# Patient Record
Sex: Male | Born: 1965
Health system: Southern US, Community
[De-identification: ages and names within clinical notes are randomized; demographics above are authoritative.]

## PROBLEM LIST (undated history)

## (undated) DIAGNOSIS — Z9861 Coronary angioplasty status: Principal | ICD-10-CM

## (undated) DIAGNOSIS — Z8719 Personal history of other diseases of the digestive system: Secondary | ICD-10-CM

## (undated) DIAGNOSIS — R7303 Prediabetes: Secondary | ICD-10-CM

## (undated) DIAGNOSIS — G4730 Sleep apnea, unspecified: Secondary | ICD-10-CM

## (undated) DIAGNOSIS — Z9289 Personal history of other medical treatment: Secondary | ICD-10-CM

## (undated) DIAGNOSIS — K5190 Ulcerative colitis, unspecified, without complications: Secondary | ICD-10-CM

## (undated) DIAGNOSIS — I2510 Atherosclerotic heart disease of native coronary artery without angina pectoris: Secondary | ICD-10-CM

## (undated) DIAGNOSIS — I10 Essential (primary) hypertension: Secondary | ICD-10-CM

## (undated) DIAGNOSIS — M109 Gout, unspecified: Secondary | ICD-10-CM

## (undated) DIAGNOSIS — F329 Major depressive disorder, single episode, unspecified: Secondary | ICD-10-CM

## (undated) DIAGNOSIS — E785 Hyperlipidemia, unspecified: Secondary | ICD-10-CM

## (undated) DIAGNOSIS — R0981 Nasal congestion: Secondary | ICD-10-CM

## (undated) DIAGNOSIS — I252 Old myocardial infarction: Secondary | ICD-10-CM

## (undated) DIAGNOSIS — K219 Gastro-esophageal reflux disease without esophagitis: Secondary | ICD-10-CM

## (undated) DIAGNOSIS — G5602 Carpal tunnel syndrome, left upper limb: Secondary | ICD-10-CM

## (undated) HISTORY — DX: Gastro-esophageal reflux disease without esophagitis: K21.9

## (undated) HISTORY — DX: Essential (primary) hypertension: I10

## (undated) HISTORY — DX: Old myocardial infarction: I25.2

## (undated) HISTORY — DX: Personal history of other medical treatment: Z92.89

## (undated) HISTORY — DX: Atherosclerotic heart disease of native coronary artery without angina pectoris: I25.10

## (undated) HISTORY — DX: Coronary angioplasty status: Z98.61

## (undated) HISTORY — DX: Nasal congestion: R09.81

## (undated) HISTORY — DX: Carpal tunnel syndrome, left upper limb: G56.02

## (undated) HISTORY — PX: TONSILLECTOMY: SHX5217

## (undated) HISTORY — DX: Prediabetes: R73.03

## (undated) HISTORY — PX: CYSTECTOMY: SUR359

## (undated) HISTORY — DX: Ulcerative colitis, unspecified, without complications: K51.90

## (undated) HISTORY — PX: HERNIA REPAIR: SHX51

## (undated) HISTORY — DX: Hyperlipidemia, unspecified: E78.5

---

## 1994-03-27 DIAGNOSIS — I252 Old myocardial infarction: Secondary | ICD-10-CM

## 1994-03-27 HISTORY — DX: Old myocardial infarction: I25.2

## 1994-03-27 HISTORY — PX: CORONARY STENT PLACEMENT: SHX1402

## 1998-03-23 ENCOUNTER — Encounter: Payer: Self-pay | Admitting: Cardiovascular Disease

## 1998-03-23 ENCOUNTER — Inpatient Hospital Stay (HOSPITAL_COMMUNITY): Admission: EM | Admit: 1998-03-23 | Discharge: 1998-03-24 | Payer: Self-pay | Admitting: Emergency Medicine

## 1998-03-24 ENCOUNTER — Encounter: Payer: Self-pay | Admitting: Cardiovascular Disease

## 2000-05-09 ENCOUNTER — Encounter (INDEPENDENT_AMBULATORY_CARE_PROVIDER_SITE_OTHER): Payer: Self-pay | Admitting: Specialist

## 2000-05-09 ENCOUNTER — Ambulatory Visit (HOSPITAL_COMMUNITY): Admission: RE | Admit: 2000-05-09 | Discharge: 2000-05-09 | Payer: Self-pay | Admitting: Gastroenterology

## 2000-07-10 ENCOUNTER — Encounter: Payer: Self-pay | Admitting: Emergency Medicine

## 2000-07-10 ENCOUNTER — Inpatient Hospital Stay (HOSPITAL_COMMUNITY): Admission: EM | Admit: 2000-07-10 | Discharge: 2000-07-12 | Payer: Self-pay | Admitting: Emergency Medicine

## 2000-07-11 HISTORY — PX: CORONARY ANGIOPLASTY: SHX604

## 2003-09-17 ENCOUNTER — Ambulatory Visit (HOSPITAL_COMMUNITY): Admission: RE | Admit: 2003-09-17 | Discharge: 2003-09-17 | Payer: Self-pay | Admitting: Gastroenterology

## 2003-09-30 ENCOUNTER — Ambulatory Visit (HOSPITAL_COMMUNITY): Admission: RE | Admit: 2003-09-30 | Discharge: 2003-09-30 | Payer: Self-pay | Admitting: Gastroenterology

## 2003-09-30 ENCOUNTER — Encounter (INDEPENDENT_AMBULATORY_CARE_PROVIDER_SITE_OTHER): Payer: Self-pay | Admitting: Specialist

## 2004-02-23 ENCOUNTER — Encounter: Admission: RE | Admit: 2004-02-23 | Discharge: 2004-02-23 | Payer: Self-pay | Admitting: Family Medicine

## 2005-05-04 ENCOUNTER — Encounter: Admission: RE | Admit: 2005-05-04 | Discharge: 2005-05-04 | Payer: Self-pay | Admitting: Gastroenterology

## 2008-09-18 ENCOUNTER — Encounter: Admission: RE | Admit: 2008-09-18 | Discharge: 2008-09-18 | Payer: Self-pay | Admitting: Gastroenterology

## 2009-01-07 ENCOUNTER — Ambulatory Visit (HOSPITAL_COMMUNITY): Admission: RE | Admit: 2009-01-07 | Discharge: 2009-01-07 | Payer: Self-pay | Admitting: Gastroenterology

## 2009-01-07 ENCOUNTER — Encounter (INDEPENDENT_AMBULATORY_CARE_PROVIDER_SITE_OTHER): Payer: Self-pay | Admitting: Gastroenterology

## 2009-01-19 ENCOUNTER — Ambulatory Visit: Payer: Self-pay | Admitting: Family Medicine

## 2009-01-20 ENCOUNTER — Encounter (HOSPITAL_COMMUNITY): Admission: RE | Admit: 2009-01-20 | Discharge: 2009-02-19 | Payer: Self-pay | Admitting: Family Medicine

## 2009-05-20 ENCOUNTER — Ambulatory Visit: Payer: Self-pay | Admitting: Family Medicine

## 2009-06-09 ENCOUNTER — Encounter (INDEPENDENT_AMBULATORY_CARE_PROVIDER_SITE_OTHER): Payer: Self-pay | Admitting: Cardiovascular Disease

## 2009-06-09 ENCOUNTER — Ambulatory Visit (HOSPITAL_COMMUNITY): Admission: RE | Admit: 2009-06-09 | Discharge: 2009-06-09 | Payer: Self-pay | Admitting: Cardiovascular Disease

## 2010-01-28 ENCOUNTER — Ambulatory Visit (HOSPITAL_COMMUNITY): Admission: RE | Admit: 2010-01-28 | Payer: Self-pay | Admitting: Cardiovascular Disease

## 2010-01-28 ENCOUNTER — Encounter (HOSPITAL_COMMUNITY): Admission: RE | Admit: 2010-01-28 | Payer: Self-pay | Admitting: Cardiology

## 2010-04-16 ENCOUNTER — Encounter: Payer: Self-pay | Admitting: Cardiovascular Disease

## 2010-04-16 ENCOUNTER — Encounter: Payer: Self-pay | Admitting: Cardiology

## 2010-04-17 ENCOUNTER — Encounter: Payer: Self-pay | Admitting: Family Medicine

## 2010-06-10 ENCOUNTER — Ambulatory Visit (INDEPENDENT_AMBULATORY_CARE_PROVIDER_SITE_OTHER): Payer: Commercial Managed Care - PPO | Admitting: Family Medicine

## 2010-06-10 DIAGNOSIS — J069 Acute upper respiratory infection, unspecified: Secondary | ICD-10-CM

## 2010-08-12 NOTE — Op Note (Signed)
NAMEJOSIAS, Daniel Buck                           ACCOUNT NO.:  [id number]   MEDICAL RECORD NO.:  [id number]                   PATIENT TYPE:  AMB   LOCATION:  ENDO                                 FACILITY:  [facility]   PHYSICIAN:  Jeryl Columbia, M.D.                 DATE OF BIRTH:  24-Jan-1966   DATE OF PROCEDURE:  09/30/2003  DATE OF DISCHARGE:                                 OPERATIVE REPORT   PROCEDURE:  Esophagogastroduodenoscopy with biopsy.   INDICATIONS:  Upper tract symptoms mostly with a lump in his throat.  Consent was signed after risks, benefits, methods, and options thoroughly  discussed multiple times in the past.   MEDICINES USED:  Demerol 100 mg, Versed 10 mg.   PROCEDURE:  The video endoscope was inserted by direct vision.  The  esophagus was normal.  In the distal esophagus was a small hiatal hernia.  I  believe the stomach increased a little bit into the esophagus but doubt this  was a short-segment Barrett's, but two biopsies were obtained at the end of  the procedure just to make sure.  The scope passed into the stomach and  advanced thorough a normal antrum, normal pylorus, into a normal duodenal  bulb, and around the C-loop to a normal second portion of the duodenum.  The  scope was withdrawn back to the bulb and a good look there ruled out ulcers  in that location.  The scope was withdrawn back to the stomach and  retroflexed.  High in the cardia the hiatal hernia was confirmed.  The  fundus, lesser and greater curve, and angularis were all normal except for a  small gastric polyp, which was cold biopsied and put in a separate  container.  Straight visualization in the stomach did not reveal any  additional findings.  Air was suctioned and the scope was slowly withdrawn.  Again a good look at the esophagus was normal.  We did withdraw back to  about 18 cm.  We then readvanced to the GE junction and the biopsies of the  questionable short segment of Barrett's versus  top of the hiatal hernia was  obtained, put in a second container.  The scope was slowly withdrawn one  more time, again not revealing any esophageal findings.  A quick look at the  posterior pharynx and cords was normal as well.  The scope was removed.  The  patient tolerated the procedure well.  There was no obvious immediate  complication.   ENDOSCOPIC DIAGNOSES:  1. Small hiatal hernia, doubt short segment of Barrett's, status post     biopsy.  2. Small stomach along the greater curve, biopsied.  3. Otherwise within normal limits esophagogastroduodenoscopy, including a     quick look at the posterior pharynx.   PLAN:  1. Await pathology.  May give him a trial of Reglan or an antispasmodic.  2.  Consider further workup by Dr. Mare Loan or even a neck CT scan.  3. Otherwise, we will touch base when we review the biopsies to recheck his     symptoms and decide further workup and plans at that juncture.                                               Jeryl Columbia, M.D.    MEM/MEDQ  D:  09/30/2003  T:  10/01/2003  Job:  [id number]   cc:   Ileene Hutchinson T. Erik Obey, M.D.  [address]  [place] [address]  Fax: [place]. Rollene Fare, M.D.  [phone] [address]  [place]  [place] [address]  Fax: [phone]   Osvaldo Human, M.D.  [address] [place]  [place] [address]  Fax: [phone]

## 2010-08-12 NOTE — Procedures (Signed)
[place]. [facility]  Patient:    Daniel Buck, Daniel Buck                        MRN: [id number] Proc. Date: 05/09/00 Adm. Date:  [id number] Attending:  Orvis Brill CC:         Richard A. Rollene Fare, M.D.   Procedure Report  PROCEDURE:  Esophagogastroduodenoscopy and biopsy.  INDICATION:  Patient with increased upper tract symptoms.  Consent was signed after risks, benefits, methods, and options thoroughly discussed with both the patient and his wife multiple times in the past.  MEDICATIONS:  Additional medicines for this procedure, since it followed the colonoscopy, was 25 mg of Demerol and 2 mg of Versed.  DESCRIPTION OF PROCEDURE:  The video endoscope was inserted by direct vision. Proximal and midesophagus were normal.  In the distal esophagus was a small hiatal hernia.  Scope passed into the stomach and advanced through a normal pylorus into a normal duodenal bulb and around the C-loop to the second and third part of the duodenum, which did have some very mild duodenitis. Scattered biopsies were obtained.  The scope was slowly withdrawn back to the bulb again, which was normal.  The scope was withdrawn back to the stomach and retroflexed.  High in the cardia, the hiatal hernia was confirmed.  The angularis was normal.  Proximal and lesser and greater curve and fundus were all normal on retroflex visualization.  Straight visualization of the stomach did show some mild antritis, possibly a tiny gastric polyp in the antrum, which was biopsied and put in a separate container.  Air was suctioned and the scopes slowly withdrawn.  Again a good look at the esophagus on slow withdrawal was normal.  The scope was removed.  The patient tolerated the procedure well.  There was no evidence of any complication.  ENDOSCOPIC DIAGNOSES: 1. Small hiatal hernia. 2. Minimal antritis, questionable tiny polyp status post biopsy. 3. Mild duodenitis, status post biopsy. 4.  Otherwise within normal limits EGD.  PLAN:  Await pathology, possibly change his pump inhibitors.  Call p.r.n., otherwise follow up in two months to recheck symptoms and make sure no further workup plans are needed. DD:  05/09/00 TD:  05/10/00 Job: [id number]

## 2010-08-12 NOTE — Procedures (Signed)
[place]. [facility]  Patient:    Daniel Buck, Daniel Buck                        MRN: [id number] Proc. Date: 05/09/00 Adm. Date:  [id number] Attending:  Orvis Brill CC:         Richard A. Rollene Fare, M.D.   Procedure Report  PROCEDURE:  Colonoscopy.  INDICATIONS:  Increase in colitis symptoms.  Want to reevaluate disease. Consent was signed after risks, benefits, methods and options were thoroughly discussed in the office with both the patient and his wife.  MEDICATIONS:  Demerol 100, Versed 10.  PROCEDURE:  Rectal inspection was pertinent for external hemorrhoids, small. Digital examination was negative.  The video pediatric colonoscope was inserted fairly easily and advance around the colon to the cecum.  This did require some abdominal pressure, but no position changes.   On insertion, surprisingly the rectum was normal with only a few rare patches of colitis. However, in the sigmoid was moderate inflammation.  No other abnormalities were seen as we advanced to the cecum.  The scope was inserted a short ways in the terminal ileum, which was normal.  Biopsies and photo documentation were obtained.  The scope was slowly withdrawn.  Random right and left-sided normal colon biopsies were obtained.  The prep was good.  The scope was slowly withdrawn and at about 25 cm is where the colitis started and biopsies of that area and the rectum were obtained then put in the third container.  Once back in the rectum, the scope was retroflexed, pertinent for some internal hemorrhoids.  Again, the rectum looked surprisingly normal with only a rare patch of very mild colitis.  Air was suctioned and scope removed.  The patient tolerated the procedure well.  There were no obvious immediate complication.  ENDOSCOPIC DIAGNOSES: 1. Internal, external hemorrhoids. 2. Significant inflammation from about 20-25 cm just involving the    sigmoid with very minimal rectal  involvement only. 3. Otherwise within normal limits to the terminal ileum, status post biopsy.  PLAN:  Await pathology, considerations of rediscussion about suppositories or enema care.  Will also rediscuss prednisone versus Enterocort, but in the meantime increase his Asacol (if that is helpful) and continue work-up with an EGD. DD:  05/09/00 TD:  05/10/00 Job: [id number]

## 2010-08-12 NOTE — Cardiovascular Report (Signed)
[place]. [facility]  Patient:    Daniel Buck, Daniel Buck                        MRN: [id number] Adm. Date:  [id number] Disc. Date: [id number] Attending:  Epifanio Lesches CC:         Richard A. Rollene Fare, M.D.  Cardiac Catheterization Laboratory  Jeryl Columbia, M.D.   Cardiac Catheterization  PROCEDURE: 1. Left heart catheterization. 2. Cine coronary angiography. 3. Biplane cine left ventriculography. 4. Distal aortography. 5. Cutting balloon angioplasty of the proximal right coronary artery    (3.75 x 15 mm monorail cutting balloon done with double bolus    Integrilin and weight-adjusted heparinization.  CARDIOLOGIST:  Troy Sine, M.D.  INDICATIONS:  Daniel Buck is a 45 year old white male who suffered an inferior wall MI in June 1996 and underwent tandem stenting of his proximal to mid right coronary artery with a reperfusion time of two hours at which time he had been enrolled in the ______ three-stent pilot study.  He developed restenosis in October and underwent repeat dilatation.  He has done well since that time.  For the last two months, he has noticed recurrent chest pain suggesting progressive angina.  He was admitted to [facility] yesterday with recurrent chest pain and is now referred for catheterization.  HEMODYNAMIC DATA: 1. Central aortic pressure was 125/71. 2. Left ventricular pressure 125/21.  PROCEDURE NOTE:  After premedication with Versed intravenously, the patient was prepped and draped in the usual fashion.  The right femoral artery was punctured anteriorly, and a 6-French sheath was inserted.  Diagnostic catheterization was done with 6-French Judkins left and right coronary catheters as well as a 6-French pigtail catheter which was used for biplane cine left ventriculography as well as distal aortography.  With the demonstration of diffuse 70 to 75% stenosis within the stented segment in the right coronary artery, the  decision was made to attempt cutting balloon intervention.  The sheath was upgraded to a 7-French system.  Double bolus Integrilin and weight-adjusted heparinization was administered with 4500 units of heparin being given.  A 3.75 x 15 mm cutting balloon was then advanced over ultimately a high-torque floppy wire.  Numerous cuts were made with still significant dumbbelling of the balloon which ultimately completely opened at 10 atmospheres within the stented segment.  Cine angiography confirmed an excellent angiographic result.  Arterial sheath was sutured in place with plans for sheath removal later today.  ANGIOGRAPHIC DATA: Left main coronary artery was angiographically normal bifurcating into an LAD and left circumflex system.  The LAD gave rise to a high proximal diagonal vessel which was normal and otherwise gave rise to several small septal perforating arteries which was normal.  The circumflex coronary artery was angiographically normal and gave rise to a prominent marginal vessel and several small proximal marginal vessels.  The right coronary artery was a large vessel.  There was 30 to 40% narrowing in the most proximal segment before the proximal to mid tandem stents.  There was diffuse narrowing within the proximal portion of the stent narrowing up to 75% somewhat eccentrically in the mid distal portion of this proximal to mid tandem  [id number] stent.  There also was 30 to 40% smooth narrowing in the region of the crux.  Biplane selective angiography revealed normal global LV function with mild residual hypokinesis in the mid diaphragmatic segment and in the lower posterolateral wall.  Distal aortography did  not demonstrate any renal artery stenosis.  There was no significant aortoiliac disease.  Following cutting balloon angioplasty, the in-stent restenosis was reduced from 75% to 0%.  There was no evidence for dissection.  There was slightly reduced flow in a very  diminutive side branch which arose within this stented segment.  IMPRESSION: 1. Normal left ventricular function with minimal residual inferior mid    hypocontractility as well as lower posterolateral hypocontractility. 2. A 50 to 75% in-stent restenosis in the proximal to mid right coronary    artery previous tandem stents placed in 1996 with 30 to 40% smooth    narrowing in the distal right coronary artery. 3. Successful cutting balloon intervention in the stented segment with a    3.75 x 15 mm cutting balloon with percent diameter stenosis being    reduced to 0% and no evidence for dissection.  (Double bolus Integrilin and    weight-adjusted heparinization.) DD:  07/11/00 TD:  07/12/00 Job: [id number]

## 2011-02-02 ENCOUNTER — Encounter: Payer: Self-pay | Admitting: Family Medicine

## 2011-02-02 ENCOUNTER — Ambulatory Visit (INDEPENDENT_AMBULATORY_CARE_PROVIDER_SITE_OTHER): Payer: Commercial Managed Care - PPO | Admitting: Family Medicine

## 2011-02-02 ENCOUNTER — Other Ambulatory Visit: Payer: Self-pay | Admitting: Family Medicine

## 2011-02-02 VITALS — BP 140/96 | HR 64 | Temp 99.0°F | Ht 72.0 in | Wt 211.0 lb

## 2011-02-02 DIAGNOSIS — M791 Myalgia, unspecified site: Secondary | ICD-10-CM

## 2011-02-02 DIAGNOSIS — I2510 Atherosclerotic heart disease of native coronary artery without angina pectoris: Secondary | ICD-10-CM

## 2011-02-02 DIAGNOSIS — Z79899 Other long term (current) drug therapy: Secondary | ICD-10-CM

## 2011-02-02 DIAGNOSIS — I10 Essential (primary) hypertension: Secondary | ICD-10-CM | POA: Insufficient documentation

## 2011-02-02 DIAGNOSIS — E7800 Pure hypercholesterolemia, unspecified: Secondary | ICD-10-CM

## 2011-02-02 DIAGNOSIS — IMO0001 Reserved for inherently not codable concepts without codable children: Secondary | ICD-10-CM

## 2011-02-02 DIAGNOSIS — R509 Fever, unspecified: Secondary | ICD-10-CM

## 2011-02-02 DIAGNOSIS — R05 Cough: Secondary | ICD-10-CM

## 2011-02-02 DIAGNOSIS — K5190 Ulcerative colitis, unspecified, without complications: Secondary | ICD-10-CM | POA: Insufficient documentation

## 2011-02-02 LAB — CBC WITH DIFFERENTIAL/PLATELET
Basophils Absolute: 0.1 10*3/uL (ref 0.0–0.1)
Basophils Relative: 1 % (ref 0–1)
Hemoglobin: 15.9 g/dL (ref 13.0–17.0)
MCHC: 34.3 g/dL (ref 30.0–36.0)
Monocytes Relative: 9 % (ref 3–12)
Neutro Abs: 7.6 10*3/uL (ref 1.7–7.7)
Neutrophils Relative %: 76 % (ref 43–77)
Platelets: 191 10*3/uL (ref 150–400)
RDW: 13 % (ref 11.5–15.5)

## 2011-02-02 LAB — POCT INFLUENZA A/B
Influenza A, POC: NEGATIVE
Influenza B, POC: NEGATIVE

## 2011-02-02 LAB — COMPREHENSIVE METABOLIC PANEL
ALT: 30 U/L (ref 0–53)
AST: 26 U/L (ref 0–37)
Albumin: 4.5 g/dL (ref 3.5–5.2)
Alkaline Phosphatase: 71 U/L (ref 39–117)
Potassium: 3.8 mEq/L (ref 3.5–5.3)
Sodium: 138 mEq/L (ref 135–145)
Total Bilirubin: 0.6 mg/dL (ref 0.3–1.2)
Total Protein: 7.3 g/dL (ref 6.0–8.3)

## 2011-02-02 LAB — LIPID PANEL
LDL Cholesterol: 72 mg/dL (ref 0–99)
VLDL: 40 mg/dL (ref 0–40)

## 2011-02-02 NOTE — Patient Instructions (Addendum)
Please try and check blood pressure elsewhere, and bring list of BP's to your visit with cardiologist. Please reschedule appointment with Dr. Alanda Amass Please try and stop using chew  (all tobacco products) Please see dentist regularly for routine cleanings, and oral cancer screenings  You may use Mucinex (plain or DM) or Robitussin (plain or DM) if needed for cough. Continue acetaminophen (tylenol) as needed for fever and pain--do not exceed amount recommended on bottle.

## 2011-02-02 NOTE — Progress Notes (Signed)
Patient presents with complaint of body aching x 3 days.  Tylenol helps, but pain recurs when it wears off, needing to take every 4 hours.  Pain in neck, back, and even skin feels sore.  Coughing x 3 days, mostly at night/morning, not much throughout day, only small amount of phlegm, not expectorating it.  Some stuffy nose.  Denies sore throat, sinus pain, sneezing.  Son was sick a couple of weeks ago, with cough x few days.  +fevers--T 101.5 at 6am.  He had appt with cardiologist today that he needed to reschedule due to this illness. Last labs were 04/2009.  Rarely checks BP elsewhere, runs 130's/80  Past Medical History  Diagnosis Date  . CAD (coronary artery disease)     stents.  Sees Dr. Alanda Amass  . Hypertension   . Hyperlipidemia   . Ulcerative colitis   . GERD (gastroesophageal reflux disease)     Past Surgical History  Procedure Date  . Tonsillectomy child    History   Social History  . Marital Status: Married    Spouse Name: N/A    Number of Children: N/A  . Years of Education: N/A   Occupational History  . Not on file.   Social History Main Topics  . Smoking status: Former Smoker    Quit date: 03/27/1992  . Smokeless tobacco: Current User    Types: Chew  . Alcohol Use: Yes     maybe once a month.  . Drug Use: No  . Sexually Active: Not on file   Other Topics Concern  . Not on file   Social History Narrative  . No narrative on file   Current outpatient prescriptions:acetaminophen (TYLENOL) 500 MG tablet, Take 1,000 mg by mouth every 4 (four) hours as needed.  , Disp: , Rfl: ;  atenolol (TENORMIN) 25 MG tablet, Take 12.5 mg by mouth daily.  , Disp: , Rfl: ;  clopidogrel (PLAVIX) 75 MG tablet, Take 75 mg by mouth daily.  , Disp: , Rfl: ;  esomeprazole (NEXIUM) 40 MG capsule, Take 40 mg by mouth 2 (two) times daily.  , Disp: , Rfl:  mesalamine (ASACOL) 400 MG EC tablet, Take 1,600 mg by mouth 3 (three) times daily.  , Disp: , Rfl: ;  Multiple Vitamins-Minerals  (MULTIVITAMIN WITH MINERALS) tablet, Take 1 tablet by mouth daily.  , Disp: , Rfl: ;  ramipril (ALTACE) 5 MG tablet, Take 5 mg by mouth daily.  , Disp: , Rfl: ;  simvastatin (ZOCOR) 40 MG tablet, Take 40 mg by mouth at bedtime.  , Disp: , Rfl:   No Known Allergies  ROS:  See HPI.  Denies nausea, vomiting, diarrhea, skin rash, shortness of breath.  PHYSICAL EXAM: BP 140/96  Pulse 64  Temp(Src) 99 F (37.2 C) (Oral)  Ht 6' (1.829 m)  Wt 211 lb (95.709 kg)  BMI 28.62 kg/m2 Well developed male, in no distress.  Rare dry cough HEENT: PERRL, EOMI, conjunctiva clear. Nasal mucosa--mild edema on L, no erythema Sinuses nontender.  OP--clear.  No erythema or lesions noted.  Mucus membranes moist Heart: regular rate and rhythm without murmur Lungs: clear bilaterally Abdomen: soft, nontender, no organomegaly or mass Extremities: no edema Skin: no rash  ASSESSMENT/PLAN: 1. Fever  POCT Influenza A/B, CBC with Differential  2. Encounter for long-term (current) use of other medications  Comprehensive metabolic panel, Lipid panel  3. Myalgia    4. Cough    5. Essential hypertension, benign    6. Pure  hypercholesterolemia    7. CAD (coronary artery disease)     Flu test negative.  Suspect viral syndrome.  DDx also includes pneumonia, but lungs clear today.  Discussed normal course of viral syndrome.  If persistent high fevers, worsening cough, shortness of breath or other symptoms, return for re-eval and x-ray.  Mucinex prn cough  HTN, hyperlipidemia and CAD.  Past due for f/u and labs. Check BP elsewhere in between now and visit with cardiologist (as BP elevated today). Pt is fasting, and past due for labs, so will draw today per patient request.  SEND COPIES OF LABS TO DR Alanda Amass (his cardiologist)

## 2011-02-03 LAB — HEMOGLOBIN A1C: Hgb A1c MFr Bld: 5.7 % — ABNORMAL HIGH (ref <5.7)

## 2011-02-05 ENCOUNTER — Emergency Department (INDEPENDENT_AMBULATORY_CARE_PROVIDER_SITE_OTHER): Payer: Commercial Managed Care - PPO

## 2011-02-05 ENCOUNTER — Emergency Department (HOSPITAL_COMMUNITY)
Admission: EM | Admit: 2011-02-05 | Discharge: 2011-02-05 | Disposition: A | Discharge status: Home / Self Care | Payer: Commercial Managed Care - PPO | Source: Home / Self Care

## 2011-02-05 ENCOUNTER — Encounter (HOSPITAL_COMMUNITY): Payer: Self-pay | Admitting: *Deleted

## 2011-02-05 DIAGNOSIS — J189 Pneumonia, unspecified organism: Secondary | ICD-10-CM

## 2011-02-05 MED ORDER — ALBUTEROL SULFATE HFA 108 (90 BASE) MCG/ACT IN AERS: 1.0000 | INHALATION_SPRAY | Freq: Four times a day (QID) | RESPIRATORY_TRACT | Status: DC | PRN

## 2011-02-05 MED ORDER — GUAIFENESIN 400 MG PO TABS: 400.0000 mg | ORAL_TABLET | Freq: Three times a day (TID) | ORAL | Status: AC

## 2011-02-05 MED ORDER — HYDROCODONE-ACETAMINOPHEN 7.5-325 MG/15ML PO SOLN: 15.0000 mL | Freq: Four times a day (QID) | ORAL | Status: AC | PRN

## 2011-02-05 MED ORDER — LEVOFLOXACIN 750 MG PO TABS: 750.0000 mg | ORAL_TABLET | Freq: Every day | ORAL | Status: AC

## 2011-02-05 NOTE — ED Notes (Signed)
C/O productive cough and chest congestion x 6 days; has had fevers over 101 x 2-3 days with intermittent dizziness.  Crackles noted in right lung base.  C/O some SOB.  Denies any pain unless coughing.  Has been taking Tyl, Tyl PM, Tussin DM without relief.

## 2011-02-05 NOTE — ED Provider Notes (Signed)
History     CSN: [id number] Arrival date & time: 02/05/2011  9:24 AM   First MD Initiated Contact with Patient 02/05/11 0919      Chief Complaint  Patient presents with  . Cough    Hemoptysis; Fever; Dizziness    (Consider location/radiation/quality/duration/timing/severity/associated sxs/prior treatment) Patient is a 45 y.o. male presenting with cough. The history is provided by the patient.  Cough This is a new problem. Episode onset: for 6 days. Episode frequency: cough spells worse at night. The problem has been gradually worsening. The cough is productive of blood-tinged sputum (sputum for las 2 days). The maximum temperature recorded prior to his arrival was 101 to 101.9 F. The fever has been present for 1 to 2 days (had fever for 2 days until last friday. No fever in last 2 days). Pertinent negatives include no chest pain, no chills, no rhinorrhea and no shortness of breath. Associated symptoms comments: Reports bilateral upper back pain with coughing. He has tried cough syrup for the symptoms. The treatment provided mild relief. Risk factors: Works in a [employer] exposure to pain fumes. Smoker: former smoker. His past medical history does not include COPD or emphysema. Past medical history comments: CAD.    Past Medical History  Diagnosis Date  . CAD (coronary artery disease)     stents.  Sees Dr. Alanda Amass  . Hypertension   . Hyperlipidemia   . Ulcerative colitis   . GERD (gastroesophageal reflux disease)   . Myocardial infarct     Past Surgical History  Procedure Date  . Tonsillectomy child  . Coronary stent placement     History reviewed. No pertinent family history.  History  Substance Use Topics  . Smoking status: Former Smoker    Quit date: 03/27/1992  . Smokeless tobacco: Current User    Types: Chew  . Alcohol Use: No     maybe once a month.      Review of Systems  Constitutional: Negative for chills and appetite change.       Good appetite,  had breakfast this am. Had fever for 2 days afebrile in last 24h  HENT: Positive for congestion. Negative for rhinorrhea, voice change and sinus pressure.   Respiratory: Positive for cough. Negative for apnea and shortness of breath.        Pain in bilateral upper back and diaphragm only with coughing. Reports wheezing, dizziness and SOB during coughing spells.   Cardiovascular: Negative for chest pain, palpitations and leg swelling.  Gastrointestinal: Negative for abdominal pain.  Musculoskeletal: Positive for back pain. Negative for arthralgias.  Skin: Negative for rash.  Neurological: Positive for dizziness.       Dizziness with coughing spells.    Allergies  Review of patient's allergies indicates no known allergies.  Home Medications   Current Outpatient Rx  Name Route Sig Dispense Refill  . ACETAMINOPHEN 500 MG PO TABS Oral Take 1,000 mg by mouth every 4 (four) hours as needed.      . ATENOLOL 25 MG PO TABS Oral Take 12.5 mg by mouth daily.      [name] CLOPIDOGREL BISULFATE 75 MG PO TABS Oral Take 75 mg by mouth daily.      . STOOL SOFTENER PO Oral Take by mouth.      . ESOMEPRAZOLE MAGNESIUM 40 MG PO CPDR Oral Take 40 mg by mouth 2 (two) times daily.      [name] MESALAMINE 400 MG PO TBEC Oral Take 1,600 mg by mouth 3 (three)  times daily.      . MULTI-VITAMIN/MINERALS PO TABS Oral Take 1 tablet by mouth daily.      [name] RAMIPRIL 5 MG PO TABS Oral Take 5 mg by mouth daily.      [name] SIMVASTATIN 40 MG PO TABS Oral Take 40 mg by mouth at bedtime.      . ALBUTEROL SULFATE HFA 108 (90 BASE) MCG/ACT IN AERS Inhalation Inhale 1-2 puffs into the lungs every 6 (six) hours as needed for wheezing. 1 Inhaler 0  . GUAIFENESIN 400 MG PO TABS Oral Take 1 tablet (400 mg total) by mouth 3 (three) times daily. 15 tablet no  . HYDROCODONE-ACETAMINOPHEN 7.5-325 MG/15ML PO SOLN Oral Take 15 mLs by mouth every 6 (six) hours as needed for pain. 120 mL 0  . LEVOFLOXACIN 750 MG PO TABS Oral Take 1 tablet (750 mg total)  by mouth daily. 7 tablet no    BP 152/90  Pulse 77  Temp(Src) 97.7 F (36.5 C) (Oral)  Resp 16  SpO2 96%  Physical Exam  Nursing note and vitals reviewed. Constitutional: He is oriented to person, place, and time. He appears well-developed and well-nourished. No distress.  HENT:  Head: Normocephalic and atraumatic.  Right Ear: External ear normal.  Left Ear: External ear normal.  Nose: Nose normal.  Mouth/Throat: Oropharynx is clear and moist. No oropharyngeal exudate.       Nasal congestion, swelling and erythema of nasal turbinates, no rhinorrhea.  Pulmonary/Chest: Effort normal. No respiratory distress. He has rales. He exhibits no tenderness.       Right base and mid lobe crackles. No wheezing, no tachypnea.  Abdominal: Soft. He exhibits no distension.  Lymphadenopathy:    He has no cervical adenopathy.  Neurological: He is alert and oriented to person, place, and time.  Skin: No rash noted.    ED Course  Procedures (including critical care time)  Labs Reviewed - No data to display Dg Chest 2 View  02/05/2011  *RADIOLOGY REPORT*  Clinical Data: Cough,  CHEST - 2 VIEW  Comparison: Plain film 02/23/2004  Findings: Normal mediastinum and cardiac silhouette.  Normal pulmonary  vasculature.  No evidence of effusion, infiltrate, or pneumothorax.  No acute bony abnormality. Degenerative osteophytosis of the thoracic spine.  There is mild compression deformity of the T9 vertebral body.  IMPRESSION: No acute cardiopulmonary process.  Mild compression deformity  at T9 suggest remote trauma.  Original Report Authenticated By: Genevive Bi, M.D.     1. CAP (community acquired pneumonia)       MDM   Patient with clinical history and findings suggestive of pneumonia although clear lungs on chest xray. H/o CAD worsening cough and respiratory symptoms although 24h with no fever. Decided to treat based on clinical findings for CAP as per guidelines.       Daniel Flamenco  Buck 02/06/11 9604

## 2011-02-06 ENCOUNTER — Telehealth: Payer: Self-pay | Admitting: *Deleted

## 2011-02-06 NOTE — Telephone Encounter (Signed)
Oh--he had said he was fasting at visit.  Daniel Buck was supposed to be adding on A1c (no results seen yet).  As long as A1c is normal, then he can just return at some point for a fasting glucose.  If Daniel Buck is unable to add (?why not results yet), then have him come for glu and A1c (POCT, nurse visit only).  Is he feeling better?

## 2011-02-06 NOTE — Telephone Encounter (Signed)
A1C was 5.7 per computer. Also did you get the message that [facility] sent re:pt. It came to me but I wasn't sure if you got a copy also?

## 2011-02-06 NOTE — Telephone Encounter (Signed)
Spoke with Melissa.  Discussed that hydrocodone is a cough suppressant, if it isn't helping with sleep, he can take benadryl/diphenhydramine (but not Tylenol PM because both products contain tylenol) at bedtime.  If ongoing bloody sputum, will need further w/u (consider CT, bronch), but at this point, will let the ABX work.  CXR results reviewed--normal.  Plavix may be contributing to why we're seeing blood.  Candy could have effected blood sugar--with normal A1c, no need to f/u now (other than to f/u cough if not improving).  Complete course of Levaquin

## 2011-02-06 NOTE — Telephone Encounter (Signed)
Spoke with patient re:lab results. Before he schedules an appt he wanted me to let you know that he had woke up during the night before his visit and had a soda and also had a butterscotch candy about 30 minutes prior to his office visit. Do you still want him to schedule an OV? Please advise. Thanks.

## 2011-02-06 NOTE — Telephone Encounter (Signed)
He is still sick, no fever since Saturday. Coughing, not sleeping, fatigue. Coughing up mucous mixed with blood, started Saturday night.   Sunday morning went to [facility],saw Dr. Gunnar Bulla, diagnosed with pneumonia. She stated his chest xray did not look bad but she was going based on what she could hear on exam.  Gave Rx Levaquin 750, Guaifenesin tab 400 mg ( 1 TID), albuterol inhaler to use if cough medicine did not work and needed relief and hydrocod-acet 7.5/325 liquid 3 tsp every 6 hours. (She told us she was giving him a cough medicine that would also help him sleep). He is still not sleeping. The hydrocod-acet does not help him sleep, it seems to keep him awake.  Is this hydrocod-acet suppose to help with his cough?   He said that something is helping with the cough, not sure which medicine or combination it is. Can you give him a cough medicine that will help him sleep at night? And tell us what can he use during the day if he needs something ? Also, he is concerned about the blood in the mucous, is that normal with what he has going on?     [place]

## 2011-02-25 HISTORY — PX: NM MYOVIEW LTD: HXRAD82

## 2011-05-01 ENCOUNTER — Ambulatory Visit (INDEPENDENT_AMBULATORY_CARE_PROVIDER_SITE_OTHER): Payer: Commercial Managed Care - PPO | Admitting: Family Medicine

## 2011-05-01 ENCOUNTER — Encounter: Payer: Self-pay | Admitting: Family Medicine

## 2011-05-01 DIAGNOSIS — J209 Acute bronchitis, unspecified: Secondary | ICD-10-CM

## 2011-05-01 DIAGNOSIS — I10 Essential (primary) hypertension: Secondary | ICD-10-CM

## 2011-05-01 MED ORDER — AZITHROMYCIN 250 MG PO TABS: ORAL_TABLET | ORAL | Status: AC

## 2011-05-01 NOTE — Progress Notes (Signed)
Chief complaint: started with sinus pressure and runny nose, B/L ear pain has progressed to cough. Feels achy in his low back and hips that started late yesterday. Feels "wheezy".  HPI:  Originally started with sore throat in the mornings about a week ago.  Felt a little better for a couple of days, and then 3 days ago began with sore throat, sinus pressure, ears feels plugged, runny nose.  Cough started today, and "wheezing". Nasal mucus is clear.  Coughed up discolored phlegm this morning.  Some lowgrade fevers at home. +sick contacts in the home.  Has been complaining of a persistent rattling in throat ever since his pneumonia in November. Mainly notices this at night.  Today is having some shortness of breath.  Hasn't used inhaler (has one at home, rx'd when he had pneumonia, has never used it).  Past Medical History  Diagnosis Date  . CAD (coronary artery disease)     stents.  Sees Dr. Alanda Amass  . Hypertension   . Hyperlipidemia   . Ulcerative colitis   . GERD (gastroesophageal reflux disease)   . Myocardial infarct     Past Surgical History  Procedure Date  . Tonsillectomy child  . Coronary stent placement     History   Social History  . Marital Status: Married    Spouse Name: N/A    Number of Children: N/A  . Years of Education: N/A   Occupational History  . Not on file.   Social History Main Topics  . Smoking status: Former Smoker    Quit date: 03/27/1992  . Smokeless tobacco: Current User    Types: Chew  . Alcohol Use: No     maybe once a month.  . Drug Use: No  . Sexually Active: Not on file   Other Topics Concern  . Not on file   Social History Narrative  . No narrative on file   Current Outpatient Prescriptions on File Prior to Visit  Medication Sig Dispense Refill  . acetaminophen (TYLENOL) 500 MG tablet Take 1,000 mg by mouth every 4 (four) hours as needed.        [name] atenolol (TENORMIN) 25 MG tablet Take 12.5 mg by mouth daily.        . clopidogrel  (PLAVIX) 75 MG tablet Take 75 mg by mouth daily.        Tery Sanfilippo Calcium (STOOL SOFTENER PO) Take by mouth.        . esomeprazole (NEXIUM) 40 MG capsule Take 40 mg by mouth 2 (two) times daily.        . mesalamine (ASACOL) 400 MG EC tablet Take 1,600 mg by mouth 3 (three) times daily.        . Multiple Vitamins-Minerals (MULTIVITAMIN WITH MINERALS) tablet Take 1 tablet by mouth daily.        . ramipril (ALTACE) 5 MG tablet Take 5 mg by mouth daily.        . simvastatin (ZOCOR) 40 MG tablet Take 40 mg by mouth at bedtime.        [name] albuterol (PROVENTIL HFA;VENTOLIN HFA) 108 (90 BASE) MCG/ACT inhaler Inhale 1-2 puffs into the lungs every 6 (six) hours as needed for wheezing.  1 Inhaler  0   No Known Allergies  ROS: Denies nausea or vomiting.  Diarrhea (loose stools) yesterday and today. No different than can occur with UC.  Denies skin rash. Some low back pain recently.  PHYSICAL EXAM: BP 130/92  Pulse 92  Temp(Src) 99.7  F (37.6 C) (Tympanic)  Ht 6' (1.829 m)  Wt 208 lb (94.348 kg)  BMI 28.21 kg/m2 Well developed, pleasant male in no distress HEENT:  PERRL, EOMI, conjunctiva clear. TM's and EAC's normal bilaterally.  OP clear.  Sinuses nontender. Nasal mucosa moderately-severely edematous R>L, no purulence Neck: no lymphadenopathy Heart: regular rate and rhythm without murmur Lungs: coarse breath sounds on right with slight wheeze and ronchi.  Some partial clearing with cough, but still coarses on right.  Fair air movement Skin: no rash Back: no CVA tenderness or spine tenderness Psych: normal mood, affect, hygiene and grooming  ASSESSMENT/PLAN: 1. Essential hypertension, benign    2. Acute bronchitis  azithromycin (ZITHROMAX) 250 MG tablet   URI--continue supportive measures.  Drink plenty of fluids, add guaifenesin.  Use decongestant sparingly, and monitor BP while using. Bronchitis--zpak.  Add guaifenesin.  Consider Delsym if needed.  If worsening cough, may use hydrocodone  syrup he has left at home. Possible reactive airways--use albuterol as needed.  Shown proper use.

## 2011-05-01 NOTE — Patient Instructions (Signed)
URI--continue supportive measures.  Drink plenty of fluids, add guaifenesin.  Use decongestant sparingly, and monitor BP while using. Bronchitis--zpak.  Add guaifenesin.  Consider Delsym if needed for cough suppressant.  If worsening cough, may use hydrocodone syrup you have left at home. Use the albuterol inhaler if you are having tightness in chest and shortness of breath.  You may use this every 4-6 hours if needed. Consider trying sinus rinse kit or Neti-pot if sinus pain recurs .

## 2011-08-03 ENCOUNTER — Encounter: Payer: Self-pay | Admitting: Family Medicine

## 2011-08-03 ENCOUNTER — Ambulatory Visit (INDEPENDENT_AMBULATORY_CARE_PROVIDER_SITE_OTHER): Payer: Commercial Managed Care - PPO | Admitting: Family Medicine

## 2011-08-03 VITALS — BP 130/90 | HR 72 | Ht 72.0 in | Wt 211.0 lb

## 2011-08-03 DIAGNOSIS — M109 Gout, unspecified: Secondary | ICD-10-CM

## 2011-08-03 MED ORDER — CELECOXIB 200 MG PO CAPS: ORAL_CAPSULE | ORAL | Status: DC

## 2011-08-03 NOTE — Progress Notes (Signed)
Chief Complaint  Patient presents with  . Foot Pain    right foot pain x 3 weeks, worsened in the last 3 days. Thinks he may gout.In the past took cherry extract with good results, took this time and did not help.   HPI: Presents with complaint of pain in R great toe since mid-April.  Worsening pain over the last 2-3 days.  Last night had severe pain, 9/10, and pain radiated up to back of calf, noticed veins bulging in back of calf.  This morning foot was red, swollen. Calf veins are better now, and redness has improved.    This is the second time in 5 years that flare has been this bad.  Problems with pain in both great toes periodically over the last 5 years. He assumed was gout, and self-treated with cherry extract which usually works in a day or two, not working this time. Has had pain in bilateral great toes with walking for a while, never pain into calf like last night.   Colitis has been controlled, no recent blood in stool; he was told to avoid NSAIDs due to his colitis (and likely bc of plavix also)  Past Medical History  Diagnosis Date  . CAD (coronary artery disease)     stents.  Sees Dr. Rollene Fare  . Hypertension   . Hyperlipidemia   . Ulcerative colitis   . GERD (gastroesophageal reflux disease)   . Myocardial infarct    Past Surgical History  Procedure Date  . Tonsillectomy child  . Coronary stent placement    History   Social History  . Marital Status: Married    Spouse Name: N/A    Number of Children: N/A  . Years of Education: N/A   Occupational History  . Not on file.   Social History Main Topics  . Smoking status: Former Smoker    Quit date: 03/27/1992  . Smokeless tobacco: Current User    Types: Chew  . Alcohol Use: No     maybe once a month.  . Drug Use: No  . Sexually Active: Not on file   Other Topics Concern  . Not on file   Social History Narrative  . No narrative on file   Current Outpatient Prescriptions on File Prior to Visit    Medication Sig Dispense Refill  . acetaminophen (TYLENOL) 500 MG tablet Take 1,000 mg by mouth every 4 (four) hours as needed.        [name] albuterol (PROVENTIL HFA;VENTOLIN HFA) 108 (90 BASE) MCG/ACT inhaler Inhale 1-2 puffs into the lungs every 6 (six) hours as needed for wheezing.  1 Inhaler  0  . atenolol (TENORMIN) 25 MG tablet Take 12.5 mg by mouth daily.        . clopidogrel (PLAVIX) 75 MG tablet Take 75 mg by mouth daily.        Mariane Baumgarten Calcium (STOOL SOFTENER PO) Take 1 capsule by mouth daily.       [name] esomeprazole (NEXIUM) 40 MG capsule Take 40 mg by mouth 2 (two) times daily.        . mesalamine (ASACOL) 400 MG EC tablet Take 1,600 mg by mouth 3 (three) times daily.        . Multiple Vitamins-Minerals (MULTIVITAMIN WITH MINERALS) tablet Take 1 tablet by mouth daily.        . ramipril (ALTACE) 5 MG tablet Take 5 mg by mouth daily.        . simvastatin (ZOCOR) 40 MG tablet  Take 40 mg by mouth at bedtime.        . Vilazodone HCl (VIIBRYD) 40 MG TABS Take 40 mg by mouth daily.       No Known Allergies  ROS:  Denies fevers, skin rash, GI complaints, blood in stool, bleeding/bruising or other problems  PHYSICAL EXAM: BP 130/90  Pulse 72  Ht 6' (1.829 m)  Wt 211 lb (95.709 kg)  BMI 28.62 kg/m2 Well developed male, in mild discomfort, severe with movement and palpation of MTP.  Right great toe--at MTP there is swelling and warmth, slight purplish discoloration and pain with movement of 1st MTP joint.  2+ DP and PT pulses.  Calf nontender  ASSESSMENT/PLAN: 1. Gout attack  celecoxib (CELEBREX) 200 MG capsule, Uric acid    Reviewed potential treatment options.  Samples of Celebrex given due to being on Plavix and history of colitis Briefly discussed prednisone--if not tolerating Celebrex or not helping, can call for rx. Also can call for vicodin if needed for pain control (declines today).  Briefly reviewed colcrys--avoiding now due to his colitis and GI side effects.  Reviewed and  given information on low purine diet.  Recommend checking baseline uric acid level--not now during acute flare, but once flare resolves.  If preferred, he can try low purine diet for a few weeks prior to checking. Discussed use of allopurinol to prevent gout flares if uric acid level is found to be elevated.

## 2011-08-03 NOTE — Patient Instructions (Signed)
Gout Gout is an inflammatory condition (arthritis) caused by a buildup of uric acid crystals in the joints. Uric acid is a chemical that is normally present in the blood. Under some circumstances, uric acid can form into crystals in your joints. This causes joint redness, soreness, and swelling (inflammation). Repeat attacks are common. Over time, uric acid crystals can form into masses (tophi) near a joint, causing disfigurement. Gout is treatable and often preventable. CAUSES  The disease begins with elevated levels of uric acid in the blood. Uric acid is produced by your body when it breaks down a naturally found substance called purines. This also happens when you eat certain foods such as meats and fish. Causes of an elevated uric acid level include:  Being passed down from parent to child (heredity).   Diseases that cause increased uric acid production (obesity, psoriasis, some cancers).   Excessive alcohol use.   Diet, especially diets rich in meat and seafood.   Medicines, including certain cancer-fighting drugs (chemotherapy), diuretics, and aspirin.   Chronic kidney disease. The kidneys are no longer able to remove uric acid well.   Problems with metabolism.  Conditions strongly associated with gout include:  Obesity.   High blood pressure.   High cholesterol.   Diabetes.  Not everyone with elevated uric acid levels gets gout. It is not understood why some people get gout and others do not. Surgery, joint injury, and eating too much of certain foods are some of the factors that can lead to gout. SYMPTOMS   An attack of gout comes on quickly. It causes intense pain with redness, swelling, and warmth in a joint.   Fever can occur.   Often, only one joint is involved. Certain joints are more commonly involved:   Base of the big toe.   Knee.   Ankle.   Wrist.   Finger.  Without treatment, an attack usually goes away in a few days to weeks. Between attacks, you  usually will not have symptoms, which is different from many other forms of arthritis. DIAGNOSIS  Your caregiver will suspect gout based on your symptoms and exam. Removal of fluid from the joint (arthrocentesis) is done to check for uric acid crystals. Your caregiver will give you a medicine that numbs the area (local anesthetic) and use a needle to remove joint fluid for exam. Gout is confirmed when uric acid crystals are seen in joint fluid, using a special microscope. Sometimes, blood, urine, and X-ray tests are also used. TREATMENT  There are 2 phases to gout treatment: treating the sudden onset (acute) attack and preventing attacks (prophylaxis). Treatment of an Acute Attack  Medicines are used. These include anti-inflammatory medicines or steroid medicines.   An injection of steroid medicine into the affected joint is sometimes necessary.   The painful joint is rested. Movement can worsen the arthritis.   You may use warm or cold treatments on painful joints, depending which works best for you.   Discuss the use of coffee, vitamin C, or cherries with your caregiver. These may be helpful treatment options.  Treatment to Prevent Attacks After the acute attack subsides, your caregiver may advise prophylactic medicine. These medicines either help your kidneys eliminate uric acid from your body or decrease your uric acid production. You may need to stay on these medicines for a very long time. The early phase of treatment with prophylactic medicine can be associated with an increase in acute gout attacks. For this reason, during the first few months  of treatment, your caregiver may also advise you to take medicines usually used for acute gout treatment. Be sure you understand your caregiver's directions. You should also discuss dietary treatment with your caregiver. Certain foods such as meats and fish can increase uric acid levels. Other foods such as dairy can decrease levels. Your caregiver  can give you a list of foods to avoid. HOME CARE INSTRUCTIONS   Do not take aspirin to relieve pain. This raises uric acid levels.   Only take over-the-counter or prescription medicines for pain, discomfort, or fever as directed by your caregiver.   Rest the joint as much as possible. When in bed, keep sheets and blankets off painful areas.   Keep the affected joint raised (elevated).   Use crutches if the painful joint is in your leg.   Drink enough water and fluids to keep your urine clear or pale yellow. This helps your body get rid of uric acid. Do not drink alcoholic beverages. They slow the passage of uric acid.   Follow your caregiver's dietary instructions. Pay careful attention to the amount of protein you eat. Your daily diet should emphasize fruits, vegetables, whole grains, and fat-free or low-fat milk products.   Maintain a healthy body weight.  SEEK MEDICAL CARE IF:   You have an oral temperature above 102 F (38.9 C).   You develop diarrhea, vomiting, or any side effects from medicines.   You do not feel better in 24 hours, or you are getting worse.  SEEK IMMEDIATE MEDICAL CARE IF:   Your joint becomes suddenly more tender and you have:   Chills.   An oral temperature above 102 F (38.9 C), not controlled by medicine.  MAKE SURE YOU:   Understand these instructions.   Will watch your condition.   Will get help right away if you are not doing well or get worse.  Document Released: 03/10/2000 Document Revised: 03/02/2011 Document Reviewed: 06/21/2009 [facility] Patient Information 2012 [place].

## 2011-08-07 ENCOUNTER — Telehealth: Payer: Self-pay | Admitting: Family Medicine

## 2011-08-07 NOTE — Telephone Encounter (Signed)
Masson's toe started feeling better the next day. Still had some pain but been doing pretty good over weekend.  Sunday afternoon started having pain on bottom of foot at ball of big toe. Today he said he can't put any weight on it. It doesn't hurt if he is sitting down but if he pushes on that area or tries to put weight on it he has pain.  Not swollen, not warm to touch. Still taking Celebrex twice a day.  Please advise.

## 2011-08-07 NOTE — Telephone Encounter (Signed)
If he hasn't had any trauma/injury or fever which would suggest an etiology of new acute pain other than gout, then we can either add Colcrys, or change to a medrol dosepack, as it appears that the celebrex isn't working.  If there is potentially another etiology (trauma, injury, etc.) then needs eval, possibly x-ray.  We can also rx vicodin, if needed for pain.  Let me know if there is a preferred treatment (colcrys vs steroids, and/or vicodin)

## 2011-08-08 NOTE — Telephone Encounter (Signed)
He said today a little better. Doesn't really want to take anything else right now. Is it ok to continue on Celebrex a little longer and see how he does?

## 2011-08-08 NOTE — Telephone Encounter (Signed)
Yes, it is fine to continue the celebrex 232m twice daily. If you need, there are more samples, or I can send Rx to pharmacy if no samples and more are needed

## 2011-12-18 ENCOUNTER — Other Ambulatory Visit: Payer: Commercial Managed Care - PPO

## 2011-12-18 DIAGNOSIS — M109 Gout, unspecified: Secondary | ICD-10-CM

## 2011-12-19 LAB — URIC ACID: Uric Acid, Serum: 8.3 mg/dL — ABNORMAL HIGH (ref 4.0–7.8)

## 2011-12-26 DIAGNOSIS — G5602 Carpal tunnel syndrome, left upper limb: Secondary | ICD-10-CM

## 2011-12-26 HISTORY — DX: Carpal tunnel syndrome, left upper limb: G56.02

## 2012-01-18 ENCOUNTER — Other Ambulatory Visit: Payer: Self-pay | Admitting: Orthopedic Surgery

## 2012-01-19 ENCOUNTER — Encounter (HOSPITAL_BASED_OUTPATIENT_CLINIC_OR_DEPARTMENT_OTHER): Payer: Self-pay | Admitting: *Deleted

## 2012-01-19 DIAGNOSIS — F32A Depression, unspecified: Secondary | ICD-10-CM

## 2012-01-19 HISTORY — DX: Depression, unspecified: F32.A

## 2012-01-22 ENCOUNTER — Encounter (HOSPITAL_BASED_OUTPATIENT_CLINIC_OR_DEPARTMENT_OTHER): Payer: Self-pay | Admitting: *Deleted

## 2012-01-24 ENCOUNTER — Encounter (HOSPITAL_BASED_OUTPATIENT_CLINIC_OR_DEPARTMENT_OTHER)
Admission: RE | Admit: 2012-01-24 | Discharge: 2012-01-24 | Disposition: A | Discharge status: Home / Self Care | Payer: Commercial Managed Care - PPO | Source: Ambulatory Visit | Attending: Orthopedic Surgery | Admitting: Orthopedic Surgery

## 2012-01-24 LAB — BASIC METABOLIC PANEL
BUN: 15 mg/dL (ref 6–23)
CO2: 25 mEq/L (ref 19–32)
Calcium: 9.8 mg/dL (ref 8.4–10.5)
Creatinine, Ser: 1.04 mg/dL (ref 0.50–1.35)

## 2012-01-26 ENCOUNTER — Encounter (HOSPITAL_BASED_OUTPATIENT_CLINIC_OR_DEPARTMENT_OTHER): Payer: Self-pay | Admitting: *Deleted

## 2012-01-26 ENCOUNTER — Ambulatory Visit (HOSPITAL_BASED_OUTPATIENT_CLINIC_OR_DEPARTMENT_OTHER): Payer: 59 | Admitting: Certified Registered Nurse Anesthetist

## 2012-01-26 ENCOUNTER — Encounter (HOSPITAL_BASED_OUTPATIENT_CLINIC_OR_DEPARTMENT_OTHER)
Admission: RE | Disposition: A | Discharge status: Home / Self Care | Payer: Self-pay | Source: Ambulatory Visit | Attending: Orthopedic Surgery

## 2012-01-26 ENCOUNTER — Ambulatory Visit (HOSPITAL_BASED_OUTPATIENT_CLINIC_OR_DEPARTMENT_OTHER)
Admission: RE | Admit: 2012-01-26 | Discharge: 2012-01-26 | Disposition: A | Discharge status: Home / Self Care | Payer: 59 | Source: Ambulatory Visit | Attending: Orthopedic Surgery | Admitting: Orthopedic Surgery

## 2012-01-26 ENCOUNTER — Encounter (HOSPITAL_BASED_OUTPATIENT_CLINIC_OR_DEPARTMENT_OTHER): Payer: Self-pay | Admitting: Certified Registered Nurse Anesthetist

## 2012-01-26 DIAGNOSIS — I2510 Atherosclerotic heart disease of native coronary artery without angina pectoris: Secondary | ICD-10-CM | POA: Insufficient documentation

## 2012-01-26 DIAGNOSIS — I10 Essential (primary) hypertension: Secondary | ICD-10-CM | POA: Insufficient documentation

## 2012-01-26 DIAGNOSIS — E785 Hyperlipidemia, unspecified: Secondary | ICD-10-CM | POA: Insufficient documentation

## 2012-01-26 DIAGNOSIS — F3289 Other specified depressive episodes: Secondary | ICD-10-CM | POA: Insufficient documentation

## 2012-01-26 DIAGNOSIS — K219 Gastro-esophageal reflux disease without esophagitis: Secondary | ICD-10-CM | POA: Insufficient documentation

## 2012-01-26 DIAGNOSIS — M109 Gout, unspecified: Secondary | ICD-10-CM | POA: Insufficient documentation

## 2012-01-26 DIAGNOSIS — Z01812 Encounter for preprocedural laboratory examination: Secondary | ICD-10-CM | POA: Insufficient documentation

## 2012-01-26 DIAGNOSIS — Z79899 Other long term (current) drug therapy: Secondary | ICD-10-CM | POA: Insufficient documentation

## 2012-01-26 DIAGNOSIS — F329 Major depressive disorder, single episode, unspecified: Secondary | ICD-10-CM | POA: Insufficient documentation

## 2012-01-26 DIAGNOSIS — K5190 Ulcerative colitis, unspecified, without complications: Secondary | ICD-10-CM | POA: Insufficient documentation

## 2012-01-26 DIAGNOSIS — G5600 Carpal tunnel syndrome, unspecified upper limb: Secondary | ICD-10-CM | POA: Insufficient documentation

## 2012-01-26 HISTORY — DX: Gout, unspecified: M10.9

## 2012-01-26 HISTORY — PX: CARPAL TUNNEL RELEASE: SHX101

## 2012-01-26 HISTORY — DX: Major depressive disorder, single episode, unspecified: F32.9

## 2012-01-26 SURGERY — CARPAL TUNNEL RELEASE
Anesthesia: General | Site: Wrist | Laterality: Left | Wound class: Clean

## 2012-01-26 MED ORDER — DEXAMETHASONE SODIUM PHOSPHATE 10 MG/ML IJ SOLN
INTRAMUSCULAR | Status: DC | PRN
  Administered 2012-01-26: 10 mg via INTRAVENOUS

## 2012-01-26 MED ORDER — LACTATED RINGERS IV SOLN
INTRAVENOUS | Status: DC
  Administered 2012-01-26: 07:00:00 via INTRAVENOUS

## 2012-01-26 MED ORDER — MIDAZOLAM HCL 5 MG/5ML IJ SOLN
INTRAMUSCULAR | Status: DC | PRN
  Administered 2012-01-26: 2 mg via INTRAVENOUS

## 2012-01-26 MED ORDER — ONDANSETRON HCL 4 MG/2ML IJ SOLN: 4.0000 mg | Freq: Once | INTRAMUSCULAR | Status: DC | PRN

## 2012-01-26 MED ORDER — FENTANYL CITRATE 0.05 MG/ML IJ SOLN
INTRAMUSCULAR | Status: DC | PRN
  Administered 2012-01-26: 25 ug via INTRAVENOUS
  Administered 2012-01-26: 50 ug via INTRAVENOUS

## 2012-01-26 MED ORDER — ONDANSETRON HCL 4 MG/2ML IJ SOLN
INTRAMUSCULAR | Status: DC | PRN
  Administered 2012-01-26: 4 mg via INTRAVENOUS

## 2012-01-26 MED ORDER — LIDOCAINE HCL (CARDIAC) 20 MG/ML IV SOLN
INTRAVENOUS | Status: DC | PRN
  Administered 2012-01-26: 60 mg via INTRAVENOUS

## 2012-01-26 MED ORDER — OXYCODONE-ACETAMINOPHEN 5-325 MG PO TABS: ORAL_TABLET | ORAL | Status: DC

## 2012-01-26 MED ORDER — CHLORHEXIDINE GLUCONATE 4 % EX LIQD: 60.0000 mL | Freq: Once | CUTANEOUS | Status: DC

## 2012-01-26 MED ORDER — PROPOFOL 10 MG/ML IV BOLUS
INTRAVENOUS | Status: DC | PRN
  Administered 2012-01-26: 200 mg via INTRAVENOUS

## 2012-01-26 MED ORDER — OXYCODONE HCL 5 MG/5ML PO SOLN: 5.0000 mg | Freq: Once | ORAL | Status: DC | PRN

## 2012-01-26 MED ORDER — OXYCODONE HCL 5 MG PO TABS: 5.0000 mg | ORAL_TABLET | Freq: Once | ORAL | Status: DC | PRN

## 2012-01-26 MED ORDER — HYDROMORPHONE HCL PF 1 MG/ML IJ SOLN: 0.2500 mg | INTRAMUSCULAR | Status: DC | PRN

## 2012-01-26 MED ORDER — LIDOCAINE HCL 2 % IJ SOLN
INTRAMUSCULAR | Status: DC | PRN
  Administered 2012-01-26: 3.5 mL

## 2012-01-26 SURGICAL SUPPLY — 39 items
BANDAGE ADHESIVE 1X3 (GAUZE/BANDAGES/DRESSINGS) IMPLANT
BANDAGE ELASTIC 3 VELCRO ST LF (GAUZE/BANDAGES/DRESSINGS) ×2 IMPLANT
BLADE SURG 15 STRL LF DISP TIS (BLADE) ×1 IMPLANT
BLADE SURG 15 STRL SS (BLADE) ×2
BNDG CMPR 9X4 STRL LF SNTH (GAUZE/BANDAGES/DRESSINGS) ×1
BNDG ESMARK 4X9 LF (GAUZE/BANDAGES/DRESSINGS) ×1 IMPLANT
BRUSH SCRUB EZ PLAIN DRY (MISCELLANEOUS) ×2 IMPLANT
CLOTH BEACON ORANGE TIMEOUT ST (SAFETY) ×2 IMPLANT
CORDS BIPOLAR (ELECTRODE) IMPLANT
COVER MAYO STAND STRL (DRAPES) ×2 IMPLANT
COVER TABLE BACK 60X90 (DRAPES) ×2 IMPLANT
CUFF TOURNIQUET SINGLE 18IN (TOURNIQUET CUFF) ×1 IMPLANT
DECANTER SPIKE VIAL GLASS SM (MISCELLANEOUS) IMPLANT
DRAPE EXTREMITY T 121X128X90 (DRAPE) ×2 IMPLANT
DRAPE SURG 17X23 STRL (DRAPES) ×2 IMPLANT
GLOVE BIO SURGEON STRL SZ7.5 (GLOVE) ×1 IMPLANT
GLOVE BIOGEL M STRL SZ7.5 (GLOVE) ×2 IMPLANT
GLOVE BIOGEL PI IND STRL 8 (GLOVE) IMPLANT
GLOVE BIOGEL PI INDICATOR 8 (GLOVE) ×1
GLOVE ORTHO TXT STRL SZ7.5 (GLOVE) ×2 IMPLANT
GOWN PREVENTION PLUS XLARGE (GOWN DISPOSABLE) ×2 IMPLANT
GOWN PREVENTION PLUS XXLARGE (GOWN DISPOSABLE) ×4 IMPLANT
NEEDLE 27GAX1X1/2 (NEEDLE) ×1 IMPLANT
PACK BASIN DAY SURGERY FS (CUSTOM PROCEDURE TRAY) ×2 IMPLANT
PAD CAST 3X4 CTTN HI CHSV (CAST SUPPLIES) ×1 IMPLANT
PADDING CAST ABS 4INX4YD NS (CAST SUPPLIES) ×1
PADDING CAST ABS COTTON 4X4 ST (CAST SUPPLIES) ×1 IMPLANT
PADDING CAST COTTON 3X4 STRL (CAST SUPPLIES) ×2
SPLINT PLASTER CAST XFAST 3X15 (CAST SUPPLIES) ×5 IMPLANT
SPLINT PLASTER XTRA FASTSET 3X (CAST SUPPLIES) ×5
SPONGE GAUZE 4X4 12PLY (GAUZE/BANDAGES/DRESSINGS) ×2 IMPLANT
STOCKINETTE 4X48 STRL (DRAPES) ×2 IMPLANT
STRIP CLOSURE SKIN 1/2X4 (GAUZE/BANDAGES/DRESSINGS) ×2 IMPLANT
SUT PROLENE 3 0 PS 2 (SUTURE) ×2 IMPLANT
SYR 3ML 23GX1 SAFETY (SYRINGE) IMPLANT
SYR CONTROL 10ML LL (SYRINGE) ×1 IMPLANT
TRAY DSU PREP LF (CUSTOM PROCEDURE TRAY) ×2 IMPLANT
UNDERPAD 30X30 INCONTINENT (UNDERPADS AND DIAPERS) ×2 IMPLANT
WATER STERILE IRR 1000ML POUR (IV SOLUTION) ×1 IMPLANT

## 2012-01-26 NOTE — Anesthesia Preprocedure Evaluation (Signed)
Anesthesia Evaluation  Patient identified by MRN, date of birth, ID band Patient awake    Reviewed: Allergy & Precautions, H&P , NPO status , [name] Chart, lab work & pertinent test results  Airway Mallampati: I TM Distance: >3 FB Neck ROM: Full    Dental  (+) Teeth Intact and Dental Advisory Given   Pulmonary  breath sounds clear to auscultation        Cardiovascular hypertension, Pt. on medications + CAD and + Past MI Rhythm:Regular Rate:Normal     Neuro/Psych    GI/Hepatic GERD-  Medicated and Controlled,  Endo/Other    Renal/GU      Musculoskeletal   Abdominal   Peds  Hematology   Anesthesia Other Findings   Reproductive/Obstetrics                           Anesthesia Physical Anesthesia Plan  ASA: III  Anesthesia Plan: General   Post-op Pain Management:    Induction: Intravenous  Airway Management Planned: LMA  Additional Equipment:   Intra-op Plan:   Post-operative Plan: Extubation in OR  Informed Consent: I have reviewed the patients History and Physical, chart, labs and discussed the procedure including the risks, benefits and alternatives for the proposed anesthesia with the patient or authorized representative who has indicated his/her understanding and acceptance.   Dental advisory given  Plan Discussed with: CRNA, Anesthesiologist and Surgeon  Anesthesia Plan Comments:         Anesthesia Quick Evaluation

## 2012-01-26 NOTE — Anesthesia Postprocedure Evaluation (Signed)
  Anesthesia Post-op Note  Patient: Daniel Buck  Procedure(s) Performed: Procedure(s) (LRB) with comments: CARPAL TUNNEL RELEASE (Left)  Patient Location: PACU  Anesthesia Type:General  Level of Consciousness: awake, alert  and oriented  Airway and Oxygen Therapy: Patient Spontanous Breathing and Patient connected to face mask oxygen  Post-op Pain: mild  Post-op Assessment: Post-op Vital signs reviewed  Post-op Vital Signs: Reviewed  Complications: No apparent anesthesia complications

## 2012-01-26 NOTE — Brief Op Note (Signed)
01/26/2012  8:07 AM  PATIENT:  Daniel Buck  46 y.o. male  PRE-OPERATIVE DIAGNOSIS:   CARPAL TUNNEL SYNDROME left  POST-OPERATIVE DIAGNOSIS:   CARPAL TUNNEL SYNDROME left  PROCEDURE:  Procedure(s) (LRB) with comments: CARPAL TUNNEL RELEASE (Left)  SURGEON:  Surgeon(s) and Role:    * Wyn Forster., MD - Primary  PHYSICIAN ASSISTANT:   ASSISTANTS: none  ANESTHESIA:   general  EBL:  Total I/O In: 800 [I.V.:800] Out: -   BLOOD ADMINISTERED:none  DRAINS: none   LOCAL MEDICATIONS USED:  XYLOCAINE   SPECIMEN:  No Specimen  DISPOSITION OF SPECIMEN:  N/A  COUNTS:  YES  TOURNIQUET:   Total Tourniquet Time Documented: Upper Arm (Left) - 10 minutes  DICTATION: .Other Dictation: Dictation Number [id number]  PLAN OF CARE: Discharge to home after PACU  PATIENT DISPOSITION:  PACU - hemodynamically stable.

## 2012-01-26 NOTE — H&P (Signed)
Daniel Buck is an 46 y.o. male.   Chief Complaint:  Left hand numbness and pain.  HPI: 24/7 numbness  And pain with positive NCV + carpal tunnel syndrome left  Past Medical History  Diagnosis Date  . Hyperlipidemia   . Ulcerative colitis   . GERD (gastroesophageal reflux disease)   . Gout   . Depression   . Carpal tunnel syndrome, left 12/2011  . CAD (coronary artery disease)   . Myocardial infarct 1996  . Hypertension     under control; has been on med. since 1996  . Nasal congestion 01/19/2012  . Jaw pain     with opening mouth wide    Past Surgical History  Procedure Date  . Tonsillectomy as a child  . Coronary stent placement 1996    x 2  . Cystectomy as a child    from neck  . Cardiac catheterization 1996; 07/11/2000  . Atherectomy 06/2000    cutting balloon atherectomy    History reviewed. No pertinent family history. Social History:  reports that he quit smoking about 19 years ago. His smokeless tobacco use includes Snuff. He reports that he does not drink alcohol or use illicit drugs.  Allergies: No Known Allergies  Medications Prior to Admission  Medication Sig Dispense Refill  . acetaminophen (TYLENOL) 500 MG tablet Take 1,000 mg by mouth every 4 (four) hours as needed.        [name] allopurinol (ZYLOPRIM) 100 MG tablet Take 100 mg by mouth daily.      [name] atenolol (TENORMIN) 25 MG tablet Take 12.5 mg by mouth daily.       . clopidogrel (PLAVIX) 75 MG tablet Take 75 mg by mouth daily.       Tery Sanfilippo Calcium (STOOL SOFTENER PO) Take 1 capsule by mouth daily.       [name] esomeprazole (NEXIUM) 40 MG capsule Take 40 mg by mouth 2 (two) times daily.       . mesalamine (ASACOL) 400 MG EC tablet Take 1,600 mg by mouth 2 (two) times daily.       . Multiple Vitamins-Minerals (MULTIVITAMIN WITH MINERALS) tablet Take 1 tablet by mouth daily.        . ramipril (ALTACE) 5 MG tablet Take 5 mg by mouth daily.      . simvastatin (ZOCOR) 40 MG tablet Take 40 mg by mouth at bedtime.        . Vilazodone HCl (VIIBRYD) 40 MG TABS Take 40 mg by mouth daily.        Results for orders placed during the hospital encounter of 01/26/12 (from the past 48 hour(s))  BASIC METABOLIC PANEL     Status: Abnormal   Collection Time   01/24/12  9:16 AM      Component Value Range Comment   Sodium 135  135 - 145 mEq/L    Potassium 4.0  3.5 - 5.1 mEq/L    Chloride 100  96 - 112 mEq/L    CO2 25  19 - 32 mEq/L    Glucose, Bld 115 (*) 70 - 99 mg/dL    BUN 15  6 - 23 mg/dL    Creatinine, Ser 1.61  0.50 - 1.35 mg/dL    Calcium 9.8  8.4 - 09.6 mg/dL    GFR calc non Af Amer 84 (*) >90 mL/min    GFR calc Af Amer >90  >90 mL/min    No results found.  Review of Systems  Constitutional: Negative.  HENT: Negative.   Eyes: Negative.   Respiratory: Negative.   Cardiovascular:       Hx of CADz with stent placement  Gastrointestinal: Negative.   Genitourinary: Negative.   Musculoskeletal:       Hx of clinical gout  Skin: Negative.   Neurological: Positive for tingling.  Endo/Heme/Allergies: Negative.   Psychiatric/Behavioral: Negative.     Blood pressure 119/80, pulse 66, temperature 98.2 F (36.8 C), temperature source Oral, resp. rate 18, height 6' (1.829 m), weight 97.07 kg (214 lb), SpO2 96.00%. Physical Exam  Constitutional: He appears well-developed and well-nourished.  HENT:  Head: Normocephalic.  Eyes: EOM are normal. Pupils are equal, round, and reactive to light.  Neck: Normal range of motion. Neck supple.  Cardiovascular: Normal rate.   Respiratory: Effort normal.  GI: Soft. Bowel sounds are normal.  Musculoskeletal: Normal range of motion.       Loss of sensation in left medial, tinels + and WFT +  Neurological:       CTS left  Skin: Skin is warm and dry.  Psychiatric: He has a normal mood and affect. His behavior is normal. Thought content normal.     Assessment/Plan Left carpal tunnel syndrome  Release of left transverse carpal ligament.  Daniel Buck,Draya Felker  V 01/26/2012, 7:22 AM

## 2012-01-26 NOTE — Anesthesia Procedure Notes (Signed)
Procedure Name: LMA Insertion Date/Time: 01/26/2012 7:35 AM Performed by: Naithan Delage D Pre-anesthesia Checklist: Patient identified, Emergency Drugs available, Suction available and Patient being monitored Patient Re-evaluated:Patient Re-evaluated prior to inductionOxygen Delivery Method: Circle System Utilized Preoxygenation: Pre-oxygenation with 100% oxygen Intubation Type: IV induction Ventilation: Mask ventilation without difficulty LMA: LMA inserted LMA Size: 4.0 Number of attempts: 1 Airway Equipment and Method: bite block Placement Confirmation: positive ETCO2 Tube secured with: Tape Dental Injury: Teeth and Oropharynx as per pre-operative assessment

## 2012-01-26 NOTE — Transfer of Care (Signed)
Immediate Anesthesia Transfer of Care Note  Patient: Daniel Buck  Procedure(s) Performed: Procedure(s) (LRB) with comments: CARPAL TUNNEL RELEASE (Left)  Patient Location: PACU  Anesthesia Type:General  Level of Consciousness: awake, alert , oriented and patient cooperative  Airway & Oxygen Therapy: Patient Spontanous Breathing and Patient connected to face mask oxygen  Post-op Assessment: Report given to PACU RN and Post -op Vital signs reviewed and stable  Post vital signs: Reviewed and stable  Complications: No apparent anesthesia complications

## 2012-01-29 ENCOUNTER — Encounter (HOSPITAL_BASED_OUTPATIENT_CLINIC_OR_DEPARTMENT_OTHER): Payer: Self-pay | Admitting: Orthopedic Surgery

## 2012-01-29 NOTE — Op Note (Signed)
NAMEROSSI, BURDO               ACCOUNT NO.:  [id number]  MEDICAL RECORD NO.:  [id number]  LOCATION:                               FACILITY:  [facility]  PHYSICIAN:  Daniel Fitch. Kristin Lamagna, M.D.      DATE OF BIRTH:  DATE OF PROCEDURE:  01/26/2012 DATE OF DISCHARGE:  01/26/2012                              OPERATIVE REPORT   PREOPERATIVE DIAGNOSES:  Chronic entrapment  neuropathy, median nerve, left carpal tunnel.  POSTOPERATIVE DIAGNOSIS:  Chronic entrapment neuropathy, median nerve, left carpal tunnel.  OPERATION:  Release of left transverse carpal ligament.  OPERATING SURGEON:  Daniel Fitch. Terrica Duecker, MD  ASSISTANT:  Marveen Reeks Dasnoit, PA-C  ANESTHESIA:  General by LMA.  SUPERVISING ANESTHESIOLOGIST:  Sheldon Silvan, MD  INDICATIONS:  Daniel Buck is a 46 year old gentleman with a history of gout and coronary artery disease, who was referred for evaluation and management of hand numbness and pain.  Clinical examination suggested left carpal tunnel syndrome.  Electrodiagnostic studies confirmed significant median neuropathy at wrist level.  Due to a failure to respond to nonoperative measures, he is brought to the operating at this time for release of the left transverse carpal ligament.  Detailed informed consent was provided preoperatively.  Questions were invited and answered in detail.  PROCEDURE:  Daniel Buck is brought to room 1 of the [facility] and placed in supine position on the operating table.  Dr. Ivin Booty had provided detailed anesthesia and informed consent.  General anesthesia by LMA technique was recommended and accepted.  In room 1 under Dr. Ivin Booty' direct supervision, general anesthesia by LMA technique was induced.  The left hand and arm were then prepped with Betadine soap and solution, sterilely draped.  A pneumatic tourniquet was applied to the proximal left brachium.  Following exsanguination of left arm with an Esmarch bandage, arterial tourniquet was  inflated to 220 mmHg.  Following routine surgical time-out, procedure commenced with a short incision in the line of the ring finger in the palm.  Subcutaneous tissues were carefully divided revealing the palmar fascia.  This split in line of its fibers to reveal the common sensory branches of the median nerve and superficial palmar arch.  The common sensory branches were followed back to the median nerve proper which was separated from the transverse carpal ligament with a Insurance risk surveyor.  The transverse carpal ligament was released subcutaneously along its ulnar border, extending into the distal forearm.  This widely opened carpal canal.  No mass or other predicaments were noted.  Bleeding points were not problematic.  The canal was carefully inspected with [facility] parotid retractor.  The wound was then repaired with intradermal 3-0 Prolene suture.  Steri- Strips were applied followed by infiltration of 3.5 mL of 2% lidocaine for postoperative comfort.  The hand and wrist were then placed in a compressive dressing of sterile gauze, sterile Webril, and a volar plaster splint maintaining the wrist in 15 degrees of dorsiflexion.  For aftercare, Daniel Buck is provided prescription for Percocet 5 mg 1 p.o. q.4-6 hours p.r.n. pain, 20 tablets without refill.  We will see him back in followup in 1 week for dressing change, suture  removal, and advancement to his therapy program.     Daniel Buck, M.D.     RVS/MEDQ  D:  01/26/2012  T:  01/26/2012  Job:  [id number]

## 2012-03-04 ENCOUNTER — Other Ambulatory Visit: Payer: Commercial Managed Care - PPO

## 2012-03-04 DIAGNOSIS — M109 Gout, unspecified: Secondary | ICD-10-CM

## 2012-03-04 LAB — URIC ACID: Uric Acid, Serum: 6.1 mg/dL (ref 4.0–7.8)

## 2012-03-11 ENCOUNTER — Other Ambulatory Visit: Payer: Self-pay | Admitting: Gastroenterology

## 2012-03-13 ENCOUNTER — Encounter: Payer: Self-pay | Admitting: Cardiovascular Disease

## 2012-03-27 ENCOUNTER — Encounter (HOSPITAL_COMMUNITY): Payer: Self-pay

## 2012-03-27 ENCOUNTER — Emergency Department (HOSPITAL_COMMUNITY)
Admission: EM | Admit: 2012-03-27 | Discharge: 2012-03-27 | Disposition: A | Discharge status: Home / Self Care | Payer: 59 | Attending: Emergency Medicine | Admitting: Emergency Medicine

## 2012-03-27 ENCOUNTER — Emergency Department (HOSPITAL_COMMUNITY): Payer: 59

## 2012-03-27 DIAGNOSIS — X500XXA Overexertion from strenuous movement or load, initial encounter: Secondary | ICD-10-CM | POA: Insufficient documentation

## 2012-03-27 DIAGNOSIS — I2510 Atherosclerotic heart disease of native coronary artery without angina pectoris: Secondary | ICD-10-CM | POA: Insufficient documentation

## 2012-03-27 DIAGNOSIS — Z87891 Personal history of nicotine dependence: Secondary | ICD-10-CM | POA: Insufficient documentation

## 2012-03-27 DIAGNOSIS — I252 Old myocardial infarction: Secondary | ICD-10-CM | POA: Insufficient documentation

## 2012-03-27 DIAGNOSIS — K5190 Ulcerative colitis, unspecified, without complications: Secondary | ICD-10-CM | POA: Insufficient documentation

## 2012-03-27 DIAGNOSIS — E785 Hyperlipidemia, unspecified: Secondary | ICD-10-CM | POA: Insufficient documentation

## 2012-03-27 DIAGNOSIS — Z8739 Personal history of other diseases of the musculoskeletal system and connective tissue: Secondary | ICD-10-CM | POA: Insufficient documentation

## 2012-03-27 DIAGNOSIS — Y9339 Activity, other involving climbing, rappelling and jumping off: Secondary | ICD-10-CM | POA: Insufficient documentation

## 2012-03-27 DIAGNOSIS — F3289 Other specified depressive episodes: Secondary | ICD-10-CM | POA: Insufficient documentation

## 2012-03-27 DIAGNOSIS — M25472 Effusion, left ankle: Secondary | ICD-10-CM

## 2012-03-27 DIAGNOSIS — K219 Gastro-esophageal reflux disease without esophagitis: Secondary | ICD-10-CM | POA: Insufficient documentation

## 2012-03-27 DIAGNOSIS — M109 Gout, unspecified: Secondary | ICD-10-CM | POA: Insufficient documentation

## 2012-03-27 DIAGNOSIS — I10 Essential (primary) hypertension: Secondary | ICD-10-CM | POA: Insufficient documentation

## 2012-03-27 DIAGNOSIS — S93402A Sprain of unspecified ligament of left ankle, initial encounter: Secondary | ICD-10-CM

## 2012-03-27 DIAGNOSIS — Z79899 Other long term (current) drug therapy: Secondary | ICD-10-CM | POA: Insufficient documentation

## 2012-03-27 DIAGNOSIS — M25473 Effusion, unspecified ankle: Secondary | ICD-10-CM | POA: Insufficient documentation

## 2012-03-27 DIAGNOSIS — F329 Major depressive disorder, single episode, unspecified: Secondary | ICD-10-CM | POA: Insufficient documentation

## 2012-03-27 DIAGNOSIS — Y929 Unspecified place or not applicable: Secondary | ICD-10-CM | POA: Insufficient documentation

## 2012-03-27 DIAGNOSIS — M25476 Effusion, unspecified foot: Secondary | ICD-10-CM | POA: Insufficient documentation

## 2012-03-27 DIAGNOSIS — S93409A Sprain of unspecified ligament of unspecified ankle, initial encounter: Secondary | ICD-10-CM | POA: Insufficient documentation

## 2012-03-27 NOTE — ED Provider Notes (Signed)
History     CSN: [id number]  Arrival date & time 03/27/12  1101   First MD Initiated Contact with Patient 03/27/12 1217      Chief Complaint  Patient presents with  . Ankle Pain    (Consider location/radiation/quality/duration/timing/severity/associated sxs/prior treatment) HPI Comments: No other injuries or complaints.  Patient is a 47 y.o. male presenting with ankle pain. The history is provided by the patient. No language interpreter was used.  Ankle Pain  The incident occurred less than 1 hour ago. Injury mechanism: stepped off his tractor and into a hole and inverted his R ankle. The pain is present in the left ankle. The pain is at a severity of 1/10. The pain has been constant since onset. He reports no foreign bodies present.    Past Medical History  Diagnosis Date  . Hyperlipidemia   . Ulcerative colitis   . GERD (gastroesophageal reflux disease)   . Gout   . Depression   . Carpal tunnel syndrome, left 12/2011  . CAD (coronary artery disease)   . Myocardial infarct 1996  . Hypertension     under control; has been on med. since 1996  . Nasal congestion 01/19/2012  . Jaw pain     with opening mouth wide    Past Surgical History  Procedure Date  . Tonsillectomy as a child  . Coronary stent placement 1996    x 2  . Cystectomy as a child    from neck  . Cardiac catheterization 1996; 07/11/2000  . Atherectomy 06/2000    cutting balloon atherectomy  . Carpal tunnel release 01/26/2012    Procedure: CARPAL TUNNEL RELEASE;  Surgeon: Wyn Forster., MD;  Location: [facility];  Service: Orthopedics;  Laterality: Left;    No family history on file.  History  Substance Use Topics  . Smoking status: Former Smoker    Quit date: 03/27/1992  . Smokeless tobacco: Current User    Types: Snuff, Chew  . Alcohol Use: No      Review of Systems  Constitutional: Negative for fever and chills.  Musculoskeletal: Positive for joint swelling.  Skin:  Negative for wound.  All other systems reviewed and are negative.    Allergies  Review of patient's allergies indicates no known allergies.  Home Medications   Current Outpatient Rx  Name  Route  Sig  Dispense  Refill  . ACETAMINOPHEN 500 MG PO TABS   Oral   Take 1,000 mg by mouth every 4 (four) hours as needed.           . ALLOPURINOL 100 MG PO TABS   Oral   Take 200 mg by mouth daily.          . ATENOLOL 25 MG PO TABS   Oral   Take 12.5 mg by mouth daily.          [name] CLOPIDOGREL BISULFATE 75 MG PO TABS   Oral   Take 75 mg by mouth daily.          . STOOL SOFTENER PO   Oral   Take 1 capsule by mouth daily.          [name] ESOMEPRAZOLE MAGNESIUM 40 MG PO CPDR   Oral   Take 40 mg by mouth 2 (two) times daily.          [name] MESALAMINE 400 MG PO TBEC   Oral   Take 800 mg by mouth 2 (two) times daily.          [name]  MULTI-VITAMIN/MINERALS PO TABS   Oral   Take 1 tablet by mouth daily.           [name] RAMIPRIL 5 MG PO TABS   Oral   Take 5 mg by mouth daily.         [name] SIMVASTATIN 40 MG PO TABS   Oral   Take 40 mg by mouth at bedtime.          [name] VILAZODONE HCL 40 MG PO TABS   Oral   Take 40 mg by mouth daily.           BP 132/77  Pulse 88  Temp 98.3 F (36.8 C) (Oral)  Resp 18  Ht 5\' 11"  (1.803 m)  Wt 210 lb (95.255 kg)  BMI 29.29 kg/m2  SpO2 99%  Physical Exam  Nursing note and vitals reviewed. Constitutional: He is oriented to person, place, and time. He appears well-developed and well-nourished.  HENT:  Head: Normocephalic and atraumatic.  Eyes: EOM are normal.  Neck: Normal range of motion.  Cardiovascular: Normal rate, regular rhythm and intact distal pulses.   Pulmonary/Chest: Effort normal. No respiratory distress.  Abdominal: Soft. He exhibits no distension. There is no tenderness.  Musculoskeletal: He exhibits tenderness.       Left ankle: tenderness. Lateral malleolus tenderness found.       Feet:  Neurological: He is alert and  oriented to person, place, and time.  Skin: Skin is warm and dry.  Psychiatric: He has a normal mood and affect. Judgment normal.    ED Course  Procedures (including critical care time)  Labs Reviewed - No data to display Dg Ankle Complete Left  03/27/2012  *RADIOLOGY REPORT*  Clinical Data: Trauma.  Pain and swelling on lateral side.  LEFT ANKLE COMPLETE - 3+ VIEW  Comparison: None.  Findings: Moderate lateral malleolar soft tissue swelling. No acute fracture or dislocation.  Ankle joint effusion identified on the lateral view.  IMPRESSION: Lateral soft tissue swelling with an ankle joint effusion. No acute osseous abnormality.   Original Report Authenticated By: Jeronimo Greaves, M.D.      1. Left ankle sprain   2. Left ankle effusion       MDM  Cam walker, crutches, ice, elevation F/u with dr. Hilda Lias prn        Evalina Field, PA 03/27/12 1250

## 2012-03-27 NOTE — ED Provider Notes (Signed)
Medical screening examination/treatment/procedure(s) were performed by non-physician practitioner and as supervising physician I was immediately available for consultation/collaboration.   Glynn Octave, MD 03/27/12 1510

## 2012-03-27 NOTE — ED Notes (Signed)
Pt reports jumped off tractor and stepped in a hole today.  C/O pain in left ankle.  Swelling noted.  Pedal pulse present.

## 2012-03-28 ENCOUNTER — Telehealth: Payer: Self-pay | Admitting: Family Medicine

## 2012-03-28 NOTE — Telephone Encounter (Signed)
Dr. Redmond School advised that he wants pt to see orthopeadic.  Fluid in joint and not comfortable sitting on this one.  Called and spoke with Melissa Rohleder, appt today with [place] at 4:00 with Dr. Derry Lory, they can pull info through Epic.Melissa said Thank you Dr. Redmond School.

## 2012-03-28 NOTE — Telephone Encounter (Signed)
Daniel Buck hurt his ankle yesterday. He jumped off tractor and hit a hole and his ankle "popped" . Took him to [facility] ER, they did x rays, said it was sprained. Are you able to look at the actual x rays in the system?  He is in a lot of pain still, actually worse than when we went to ER. Still a lot of swelling, he has swelling on both sides of ankle and about 3-3 1/2 inches up leg. He has sprained this ankle before but nothing like this. Is this normal?  Pain never completely goes away, especially worse if he has to get up for any reason, can't put any weight on it at all.  Can you let me know what you think.  Thanks

## 2012-04-02 ENCOUNTER — Other Ambulatory Visit (HOSPITAL_COMMUNITY): Payer: Self-pay | Admitting: Family Medicine

## 2012-04-02 ENCOUNTER — Other Ambulatory Visit (HOSPITAL_COMMUNITY): Payer: 59

## 2012-04-02 DIAGNOSIS — M25572 Pain in left ankle and joints of left foot: Secondary | ICD-10-CM

## 2012-08-08 ENCOUNTER — Other Ambulatory Visit (HOSPITAL_COMMUNITY): Payer: Self-pay | Admitting: Family Medicine

## 2012-08-08 DIAGNOSIS — M25572 Pain in left ankle and joints of left foot: Secondary | ICD-10-CM

## 2012-08-13 ENCOUNTER — Ambulatory Visit (HOSPITAL_COMMUNITY): Payer: 59

## 2012-08-20 ENCOUNTER — Ambulatory Visit (HOSPITAL_COMMUNITY): Payer: 59

## 2012-08-27 ENCOUNTER — Encounter: Payer: Self-pay | Admitting: Cardiovascular Disease

## 2012-08-28 ENCOUNTER — Ambulatory Visit (HOSPITAL_COMMUNITY): Payer: 59

## 2012-08-28 ENCOUNTER — Ambulatory Visit (HOSPITAL_COMMUNITY)
Admission: RE | Admit: 2012-08-28 | Discharge: 2012-08-28 | Disposition: A | Discharge status: Home / Self Care | Payer: 59 | Source: Ambulatory Visit | Attending: Cardiovascular Disease | Admitting: Cardiovascular Disease

## 2012-08-28 DIAGNOSIS — R0989 Other specified symptoms and signs involving the circulatory and respiratory systems: Secondary | ICD-10-CM | POA: Insufficient documentation

## 2012-08-28 DIAGNOSIS — I2510 Atherosclerotic heart disease of native coronary artery without angina pectoris: Secondary | ICD-10-CM | POA: Insufficient documentation

## 2012-08-28 DIAGNOSIS — E785 Hyperlipidemia, unspecified: Secondary | ICD-10-CM | POA: Insufficient documentation

## 2012-08-28 DIAGNOSIS — R011 Cardiac murmur, unspecified: Secondary | ICD-10-CM | POA: Insufficient documentation

## 2012-08-28 DIAGNOSIS — I10 Essential (primary) hypertension: Secondary | ICD-10-CM | POA: Insufficient documentation

## 2012-08-28 DIAGNOSIS — R0609 Other forms of dyspnea: Secondary | ICD-10-CM | POA: Insufficient documentation

## 2012-08-28 NOTE — Progress Notes (Signed)
*  PRELIMINARY RESULTS* Echocardiogram 2D Echocardiogram has been performed.  Daniel Buck 08/28/2012, 1:51 PM

## 2012-09-03 ENCOUNTER — Ambulatory Visit (HOSPITAL_COMMUNITY): Payer: 59

## 2012-09-11 ENCOUNTER — Other Ambulatory Visit (HOSPITAL_COMMUNITY): Payer: Self-pay | Admitting: Cardiovascular Disease

## 2012-09-11 ENCOUNTER — Telehealth (HOSPITAL_COMMUNITY): Payer: Self-pay | Admitting: Cardiovascular Disease

## 2012-09-11 DIAGNOSIS — R079 Chest pain, unspecified: Secondary | ICD-10-CM

## 2012-09-11 DIAGNOSIS — R0602 Shortness of breath: Secondary | ICD-10-CM

## 2012-09-11 DIAGNOSIS — R011 Cardiac murmur, unspecified: Secondary | ICD-10-CM

## 2012-09-11 DIAGNOSIS — I25810 Atherosclerosis of coronary artery bypass graft(s) without angina pectoris: Secondary | ICD-10-CM

## 2012-09-11 DIAGNOSIS — R9431 Abnormal electrocardiogram [ECG] [EKG]: Secondary | ICD-10-CM

## 2012-09-11 NOTE — Telephone Encounter (Signed)
Left message to call back and schedule testing

## 2012-09-17 ENCOUNTER — Ambulatory Visit (HOSPITAL_COMMUNITY): Payer: 59

## 2012-09-20 ENCOUNTER — Encounter (HOSPITAL_COMMUNITY): Payer: 59

## 2012-09-24 ENCOUNTER — Ambulatory Visit (HOSPITAL_BASED_OUTPATIENT_CLINIC_OR_DEPARTMENT_OTHER): Payer: 59

## 2012-10-01 ENCOUNTER — Ambulatory Visit (HOSPITAL_COMMUNITY): Admission: RE | Admit: 2012-10-01 | Payer: 59 | Source: Ambulatory Visit

## 2012-10-02 ENCOUNTER — Encounter (HOSPITAL_BASED_OUTPATIENT_CLINIC_OR_DEPARTMENT_OTHER): Payer: 59

## 2012-10-04 ENCOUNTER — Encounter (HOSPITAL_COMMUNITY): Payer: 59

## 2012-11-11 ENCOUNTER — Telehealth (HOSPITAL_COMMUNITY): Payer: Self-pay | Admitting: Cardiovascular Disease

## 2012-11-11 NOTE — Telephone Encounter (Signed)
I called patient to reschedule his stress test. He states that he does not want to reschedule at this time. I will remove the order until further notice.

## 2013-01-17 ENCOUNTER — Other Ambulatory Visit (INDEPENDENT_AMBULATORY_CARE_PROVIDER_SITE_OTHER): Payer: 59

## 2013-01-17 DIAGNOSIS — Z23 Encounter for immunization: Secondary | ICD-10-CM

## 2013-03-11 ENCOUNTER — Telehealth: Payer: Self-pay | Admitting: Cardiovascular Disease

## 2013-03-11 MED ORDER — CLOPIDOGREL BISULFATE 75 MG PO TABS: 75.0000 mg | ORAL_TABLET | Freq: Every day | ORAL | Status: DC

## 2013-03-11 NOTE — Telephone Encounter (Signed)
Per answering service-pt still did not get his medicine.

## 2013-03-11 NOTE — Telephone Encounter (Signed)
Returned call and pt verified x 2 w/ pt's wife, Daniel Buck.  Stated pt did not get refill on generic Plavix.  Stated she requested it on Friday and pharmacy faxed it over.  Stated she talked to Daniel Buck and she told her she would call her back if it wasn't received and she didn't get a call back.  Wife informed of complications w/ Daniel Buck refill requests being sent electronically vs paper fax.  Informed RN will send refill now to [facility].  Verbalized understanding.  RN also informed wife that pt has not been set up to see a new cardiologist.  Asked if he decided who he would like to see.  RN named MDs and wife stated Daniel Buck.  Informed appt due May 2015 and scheduling will be notified to set up recall for appt.  Verbalized understanding and agreed w/ plan.  Advised scheduling contact her for appt.  Refill(s) sent to pharmacy.  Message forwarded to Scheduling to set up Recall for May 2015.

## 2013-03-26 ENCOUNTER — Other Ambulatory Visit: Payer: Self-pay | Admitting: *Deleted

## 2013-03-26 MED ORDER — SIMVASTATIN 40 MG PO TABS: 40.0000 mg | ORAL_TABLET | Freq: Every day | ORAL | Status: DC

## 2013-03-26 NOTE — Telephone Encounter (Signed)
Rx was sent to pharmacy electronically. 

## 2013-07-03 ENCOUNTER — Telehealth: Payer: Self-pay | Admitting: *Deleted

## 2013-07-03 MED ORDER — ATENOLOL 25 MG PO TABS: 12.5000 mg | ORAL_TABLET | Freq: Every day | ORAL | Status: DC

## 2013-07-03 MED ORDER — RAMIPRIL 5 MG PO CAPS: 5.0000 mg | ORAL_CAPSULE | Freq: Every day | ORAL | Status: DC

## 2013-07-03 NOTE — Telephone Encounter (Signed)
Returned call.  Left message to call back before 4pm to discuss dosages of each med before sending to pharmacy.

## 2013-07-03 NOTE — Telephone Encounter (Signed)
Pt's wife was calling in regards to medication. Daniel Buck is completely out of Ramapril and Atenolol. The pharmacy has sent [place] over the Rx refill on Monday and they have not heard from [place] yet. The pharmacy is [facility]. He is a former [facility] pt switching to [facility].

## 2013-07-03 NOTE — Telephone Encounter (Signed)
Pt verified x 2 w/ Melissa, pt's wife, who called back.  Informed message was received and RN needs to review meds as refill requests were not received.  Verbalized understanding.  Confirmed pt is taking ramipril 5 mg daily and atenolol 12.5 mg daily.  Informed refills will be sent for 90-day, per request, w/o refills as pt will need to keep his appt in June for refills.  Verbalized understanding and agreed w/ plan.  Refill(s) sent to pharmacy: [facility].

## 2013-08-06 ENCOUNTER — Ambulatory Visit: Payer: 59 | Admitting: Family Medicine

## 2013-08-27 ENCOUNTER — Ambulatory Visit (INDEPENDENT_AMBULATORY_CARE_PROVIDER_SITE_OTHER): Payer: 59 | Admitting: Cardiology

## 2013-08-27 ENCOUNTER — Encounter: Payer: Self-pay | Admitting: Cardiology

## 2013-08-27 VITALS — BP 144/90 | HR 61 | Ht 71.0 in | Wt 220.0 lb

## 2013-08-27 DIAGNOSIS — E785 Hyperlipidemia, unspecified: Secondary | ICD-10-CM

## 2013-08-27 DIAGNOSIS — R5383 Other fatigue: Secondary | ICD-10-CM

## 2013-08-27 DIAGNOSIS — I2510 Atherosclerotic heart disease of native coronary artery without angina pectoris: Secondary | ICD-10-CM

## 2013-08-27 DIAGNOSIS — I252 Old myocardial infarction: Secondary | ICD-10-CM

## 2013-08-27 DIAGNOSIS — E7800 Pure hypercholesterolemia, unspecified: Secondary | ICD-10-CM

## 2013-08-27 DIAGNOSIS — R5381 Other malaise: Secondary | ICD-10-CM

## 2013-08-27 DIAGNOSIS — I10 Essential (primary) hypertension: Secondary | ICD-10-CM

## 2013-08-27 DIAGNOSIS — Z79899 Other long term (current) drug therapy: Secondary | ICD-10-CM

## 2013-08-27 DIAGNOSIS — Z9861 Coronary angioplasty status: Secondary | ICD-10-CM

## 2013-08-27 NOTE — Patient Instructions (Addendum)
Labs -TSH,LIPID,CMP, CBC  Your physician has requested that you have en exercise stress myoview. For further information please visit [email or web address]. Please follow instruction sheet, as given.    Your physician wants you to follow-up in [place]. You will receive a reminder letter in the mail two months in advance. If you don't receive a letter, please call our office to schedule the follow-up appointment.

## 2013-08-28 ENCOUNTER — Encounter: Payer: Self-pay | Admitting: Cardiology

## 2013-08-28 ENCOUNTER — Telehealth: Payer: Self-pay | Admitting: Cardiology

## 2013-08-28 DIAGNOSIS — I2510 Atherosclerotic heart disease of native coronary artery without angina pectoris: Secondary | ICD-10-CM | POA: Insufficient documentation

## 2013-08-28 DIAGNOSIS — Z9861 Coronary angioplasty status: Principal | ICD-10-CM

## 2013-08-28 DIAGNOSIS — I252 Old myocardial infarction: Secondary | ICD-10-CM | POA: Insufficient documentation

## 2013-08-28 DIAGNOSIS — E785 Hyperlipidemia, unspecified: Secondary | ICD-10-CM | POA: Insufficient documentation

## 2013-08-28 NOTE — Assessment & Plan Note (Signed)
I think his major speech is not as active as he used to be. However we'll then check a TSH and CBC just to be sure.

## 2013-08-28 NOTE — Assessment & Plan Note (Addendum)
He is doing actually very well without any major symptoms. Since you're he has been Myoview ordered for routine followup, will then have that done as a new baseline for him. I think after that we probably would not need to do one as recently. Dr. Lowella Fairy main reason for checking them intermittently was because of the patient's premature CAD. He is on a beta blocker, statin and ACE inhibitor as well as aspirin plus Plavix. No bleeding issues. He is very active but not with routine exercise. I did stress the importance of trying to get into some routine exercise. This allows [place] to really assess his symptoms.

## 2013-08-28 NOTE — Progress Notes (Signed)
PATIENTMARE LUDTKE MRN: [id number] DOB: [birth year] PCP: Daniel Neer, MD  Clinic Note: Chief Complaint  Patient presents with  . Appointment    no cxomplaints-former pt of [facility]   HPI: Daniel Buck is a 48 y.o. male with a PMH of premature CAD with MI at age 47 (1996)- RCA lesion treated with 2 overlapping bare-metal stents with in-stent restenosis in April 2002 treated with cutting balloon atherectomy. Myoview in December 2012 at did show inferior scar but no ischemia. He presents today for establishment of new cardiology care at the retirement of Dr. Terance Buck.  He was last seen by Dr. Rollene Buck and May 2014. At that time the plan was for him to have a Myoview and echocardiogram done prior to his visit. However this did not occur.  Interval History: He presents today doing relatively well without any major symptoms. He is relatively active, but does not exercise routinely. He has not had any labs checked in over a year. He does note if he over exits himself he will feel tired.  Cardiovascular ROS: no chest pain or dyspnea on exertion positive for - Rare, occasional palpitations negative for - chest pain, dyspnea on exertion, edema, loss of consciousness, murmur, orthopnea, paroxysmal nocturnal dyspnea, rapid heart rate, shortness of breath or Lightheadedness, dizziness, syncope/near Daniel Buck, TIA/amaurosis fugax, melena, hematochezia and hematuria, epistaxis or claudication :   Past Medical History  Diagnosis Date  . Myocardial infarct, old 1996    PCI  . CAD S/P percutaneous coronary angioplasty 1996, 2002    PCI to RCA with tandem BMS (PS 1535); 2002: Cutting Balloon atherectomy for ISR  . Hypertension     under control; has been on med. since 1996  . Hyperlipidemia LDL goal < 70   . Ulcerative colitis   . Depression 01/19/2012  . Jaw pain     with opening mouth wide  . Gout   . GERD (gastroesophageal reflux disease)   . Chronic nasal congestion   . Carpal  tunnel syndrome on left 12/2011   Prior Cardiac Evaluation and Past Surgical History:  Procedure Laterality Date  . Coronary stent placement  2130    [id number] BMS x 2 in RCA  . Coronary angioplasty  07/11/2000    Cutting Balloon PTCA for RCA ISR  . Nm myoview ltd  December 2012    Inferior infarct but no ischemia  . Transthoracic echocardiogram  June 2014    APH: EF 50-50%, moderate concentric LVH. Mild HK of the inferolateral wall. Gr1 DD;    No Known Allergies  Current Outpatient Prescriptions  Medication Sig Dispense Refill  . acetaminophen (TYLENOL) 500 MG tablet Take 1,000 mg by mouth every 4 (four) hours as needed.        [name] atenolol (TENORMIN) 25 MG tablet Take 0.5 tablets (12.5 mg total) by mouth daily. Keep appointment for refills.  45 tablet  0  . clopidogrel (PLAVIX) 75 MG tablet Take 1 tablet (75 mg total) by mouth daily. Appointment needed for refills.  90 tablet  1  . Docusate Calcium (STOOL SOFTENER PO) Take 1 capsule by mouth daily.       [name] esomeprazole (NEXIUM) 40 MG capsule Take 40 mg by mouth 2 (two) times daily.       . mesalamine (ASACOL) 400 MG EC tablet Take 800 mg by mouth 2 (two) times daily.       . Multiple Vitamins-Minerals (MULTIVITAMIN WITH MINERALS) tablet Take 1 tablet by mouth daily.        [name]  ramipril (ALTACE) 5 MG capsule Take 1 capsule (5 mg total) by mouth daily. Keep appointment for refills.  90 capsule  0  . simvastatin (ZOCOR) 40 MG tablet Take 1 tablet (40 mg total) by mouth at bedtime.  30 tablet  5  . Vilazodone HCl (VIIBRYD) 40 MG TABS Take 40 mg by mouth daily.       No current facility-administered medications for this visit.    History   Social History Narrative   He lives in [place], [place] near [place] with his wife. [place]. He is extremely active around [place], but does not have a routine exercise regimen.   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   He quit smoking in 1994.    Family History:  Father: CAD, AAA  (abdominal aortic aneurysm) Atrial fibrillation  Aortic stenosis in his mother; in his father; CAD in his father.  ROS: A comprehensive Review of Systems - Negative except symptoms noted in history of present illness, and rare GERD-type symptoms. Notably improved since last visit with Dr. Rollene Buck. he does note occasionally feeling a little more tired than usual.  PHYSICAL EXAM BP 144/90  Pulse 61  Ht 5' 11"  (1.803 m)  Wt 220 lb (99.791 kg)  BMI 30.70 kg/m2 General appearance: alert, cooperative, appears stated age, no distress and Pleasant mood and affect. Very healthy-appearing. Minimal truncal obesity HEENT: [place]/AT, EOMI, MMM, anicteric sclera Neck: no adenopathy, no carotid bruit and no JVD Lungs: clear to auscultation bilaterally, normal percussion bilaterally and Nonlabored, good air movement Heart: RRR, normal S1 and S2. Soft possible systolic murmur along the sternal border. I think this is more was breath sounds though. Nondisplaced PMI. No other M//R/G. Abdomen: soft, non-tender; bowel sounds normal; no masses,  no organomegaly Extremities: extremities normal, atraumatic, no cyanosis or edema and no ulcers, gangrene or trophic changes Pulses: 2+ and symmetric Neurologic: Alert and oriented X 3, normal strength and tone. Normal symmetric reflexes. Normal coordination and gait; CN II through XII grossly intact   Adult ECG 61 Report  Rate: 61 ;  Rhythm: normal sinus rhythm normal axis, intervals and durations, and voltage;   Narrative Interpretation: Normal EKG  Recent Labs: None since 2012 in [place]. Last check last checked were in 2014 in his paper chart.   ASSESSMENT / PLAN: Will see healthy-appearing gentleman with a supervising history of early cardiac disease. He quit smoking at the time of his MI. N. other than the brief episode of unstable angina back in 2002 where he will appendectomy of the in-stent restenosis coming off any further symptoms. Last Myoview was in 2012. He  has one ordered by Dr. Rollene Buck scheduled for later this month. He did have an echocardiogram done last year so this would not need to be repeated even though it was ordered. That order can be canceled.   CAD S/P percutaneous coronary angioplasty He is doing actually very well without any major symptoms. Since you're he has been Myoview ordered for routine followup, will then have that done as a new baseline for him. I think after that we probably would not need to do one as recently. Dr. Lowella Fairy main reason for checking them intermittently was because of the patient's premature CAD. He is on a beta blocker, statin and ACE inhibitor as well as aspirin plus Plavix. No bleeding issues. He is very active but not with routine exercise. I did stress the importance of trying to get into some  routine exercise. This allows [place] to really assess his symptoms.  Essential hypertension, benign His blood pressure borderline elevated today. He says at home is much better than that we will be in a position. When he comes in for his stress test on him to monitor his blood pressures do seem to light. We could potentially increase his ACE inhibitor. In CMP as opposed to just LFTs but his pouch to ensure that we are monitoring electrolytes and renal activity.  Myocardial infarct, old Old infarct noted on my the past. Relatively normal EF however. No further symptoms of either angina or heart failure.  Hyperlipidemia LDL goal < 70 Has not had lipids checked in quite some time. He will then check lipid panel with LFTs.  Other malaise and fatigue I think his major speech is not as active as he used to be. However we'll then check a TSH and CBC just to be sure.   Orders Placed This Encounter  Procedures  . TSH  . Lipid panel    Order Specific Question:  Has the patient fasted?    Answer:  Yes  . CBC  . Comprehensive metabolic panel    Order Specific Question:  Has the patient fasted?    Answer:  Yes  .  Myocardial Perfusion Imaging    Standing Status: Future     Number of Occurrences:      Standing Expiration Date: 08/27/2014    Scheduling Instructions:     DX CAD, S/P CABG    Order Specific Question:  Where should this test be performed    Answer:  [facility]    Order Specific Question:  Type of stress    Answer:  Exercise    Order Specific Question:  Patient weight in lbs    Answer:  220  . EKG 12-Lead   No orders of the defined types were placed in this encounter.    Followup: One year   DAVID W. Ellyn Hack, M.D., M.S. Interventional Cardiolgy [facility]

## 2013-08-28 NOTE — Assessment & Plan Note (Signed)
Old infarct noted on my the past. Relatively normal EF however. No further symptoms of either angina or heart failure.

## 2013-08-28 NOTE — Assessment & Plan Note (Signed)
Has not had lipids checked in quite some time. He will then check lipid panel with LFTs.

## 2013-08-28 NOTE — Telephone Encounter (Signed)
Left messages on both numbers for pt to call.  Needs to schedule exercise stress test oer dr, harding.[name]

## 2013-08-28 NOTE — Assessment & Plan Note (Addendum)
His blood pressure borderline elevated today. He says at home is much better than that we will be in a position. When he comes in for his stress test on him to monitor his blood pressures do seem to light. We could potentially increase his ACE inhibitor. In CMP as opposed to just LFTs but his pouch to ensure that we are monitoring electrolytes and renal activity.

## 2013-09-02 ENCOUNTER — Telehealth (HOSPITAL_COMMUNITY): Payer: Self-pay | Admitting: *Deleted

## 2013-09-08 ENCOUNTER — Telehealth: Payer: Self-pay | Admitting: Cardiology

## 2013-09-08 ENCOUNTER — Other Ambulatory Visit: Payer: Self-pay | Admitting: Cardiology

## 2013-09-08 NOTE — Telephone Encounter (Signed)
Refill sent in today for Plavix 75mg  with directions to take once.  Corrected instructions to take daily.  Voiced understanding.

## 2013-09-08 NOTE — Telephone Encounter (Signed)
Rx refill sent to patient pharmacy   

## 2013-09-08 NOTE — Telephone Encounter (Signed)
Pharmacy has a question about the Plavix rx that was just sent them today.

## 2013-09-10 ENCOUNTER — Telehealth (HOSPITAL_COMMUNITY): Payer: Self-pay | Admitting: *Deleted

## 2013-09-25 ENCOUNTER — Other Ambulatory Visit: Payer: Self-pay | Admitting: *Deleted

## 2013-09-25 MED ORDER — RAMIPRIL 5 MG PO CAPS: 5.0000 mg | ORAL_CAPSULE | Freq: Every day | ORAL | Status: DC

## 2013-09-25 MED ORDER — SIMVASTATIN 40 MG PO TABS: 40.0000 mg | ORAL_TABLET | Freq: Every day | ORAL | Status: DC

## 2013-09-25 NOTE — Telephone Encounter (Signed)
Rx refill sent to patient pharmacy   

## 2013-10-09 ENCOUNTER — Other Ambulatory Visit: Payer: Self-pay | Admitting: Cardiology

## 2013-10-09 NOTE — Telephone Encounter (Signed)
Rx was sent to pharmacy electronically. 

## 2013-10-24 ENCOUNTER — Other Ambulatory Visit: Payer: Self-pay

## 2013-10-24 ENCOUNTER — Emergency Department (HOSPITAL_COMMUNITY)
Admission: EM | Admit: 2013-10-24 | Discharge: 2013-10-25 | Disposition: A | Discharge status: Home / Self Care | Payer: 59 | Attending: Emergency Medicine | Admitting: Emergency Medicine

## 2013-10-24 ENCOUNTER — Emergency Department (HOSPITAL_COMMUNITY): Payer: 59

## 2013-10-24 ENCOUNTER — Encounter (HOSPITAL_COMMUNITY): Payer: Self-pay | Admitting: Emergency Medicine

## 2013-10-24 DIAGNOSIS — R404 Transient alteration of awareness: Secondary | ICD-10-CM | POA: Insufficient documentation

## 2013-10-24 DIAGNOSIS — F3289 Other specified depressive episodes: Secondary | ICD-10-CM | POA: Insufficient documentation

## 2013-10-24 DIAGNOSIS — Z87891 Personal history of nicotine dependence: Secondary | ICD-10-CM | POA: Insufficient documentation

## 2013-10-24 DIAGNOSIS — R110 Nausea: Secondary | ICD-10-CM | POA: Insufficient documentation

## 2013-10-24 DIAGNOSIS — E785 Hyperlipidemia, unspecified: Secondary | ICD-10-CM | POA: Insufficient documentation

## 2013-10-24 DIAGNOSIS — Z79899 Other long term (current) drug therapy: Secondary | ICD-10-CM | POA: Insufficient documentation

## 2013-10-24 DIAGNOSIS — Z9861 Coronary angioplasty status: Secondary | ICD-10-CM | POA: Insufficient documentation

## 2013-10-24 DIAGNOSIS — I2510 Atherosclerotic heart disease of native coronary artery without angina pectoris: Secondary | ICD-10-CM | POA: Insufficient documentation

## 2013-10-24 DIAGNOSIS — F329 Major depressive disorder, single episode, unspecified: Secondary | ICD-10-CM | POA: Insufficient documentation

## 2013-10-24 DIAGNOSIS — Z8709 Personal history of other diseases of the respiratory system: Secondary | ICD-10-CM | POA: Insufficient documentation

## 2013-10-24 DIAGNOSIS — Z8669 Personal history of other diseases of the nervous system and sense organs: Secondary | ICD-10-CM | POA: Insufficient documentation

## 2013-10-24 DIAGNOSIS — K1370 Unspecified lesions of oral mucosa: Secondary | ICD-10-CM | POA: Insufficient documentation

## 2013-10-24 DIAGNOSIS — R569 Unspecified convulsions: Secondary | ICD-10-CM | POA: Insufficient documentation

## 2013-10-24 DIAGNOSIS — I10 Essential (primary) hypertension: Secondary | ICD-10-CM | POA: Insufficient documentation

## 2013-10-24 DIAGNOSIS — Z7902 Long term (current) use of antithrombotics/antiplatelets: Secondary | ICD-10-CM | POA: Insufficient documentation

## 2013-10-24 DIAGNOSIS — I252 Old myocardial infarction: Secondary | ICD-10-CM | POA: Insufficient documentation

## 2013-10-24 DIAGNOSIS — K5190 Ulcerative colitis, unspecified, without complications: Secondary | ICD-10-CM | POA: Insufficient documentation

## 2013-10-24 DIAGNOSIS — F29 Unspecified psychosis not due to a substance or known physiological condition: Secondary | ICD-10-CM | POA: Insufficient documentation

## 2013-10-24 DIAGNOSIS — K219 Gastro-esophageal reflux disease without esophagitis: Secondary | ICD-10-CM | POA: Insufficient documentation

## 2013-10-24 LAB — BASIC METABOLIC PANEL
ANION GAP: 18 — AB (ref 5–15)
BUN: 17 mg/dL (ref 6–23)
CALCIUM: 9.5 mg/dL (ref 8.4–10.5)
CO2: 21 mEq/L (ref 19–32)
Chloride: 102 mEq/L (ref 96–112)
Creatinine, Ser: 1.1 mg/dL (ref 0.50–1.35)
GFR calc Af Amer: >90 mL/min (ref >90)
GFR, EST NON AFRICAN AMERICAN: 78 mL/min — AB (ref >90)
GLUCOSE: 98 mg/dL (ref 70–99)
Potassium: 3.9 mEq/L (ref 3.7–5.3)
SODIUM: 141 meq/L (ref 137–147)

## 2013-10-24 LAB — CBC
HCT: 45.3 % (ref 39.0–52.0)
Hemoglobin: 15.9 g/dL (ref 13.0–17.0)
MCH: 30.2 pg (ref 26.0–34.0)
MCHC: 35.1 g/dL (ref 30.0–36.0)
MCV: 86.1 fL (ref 78.0–100.0)
PLATELETS: 232 10*3/uL (ref 150–400)
RBC: 5.26 MIL/uL (ref 4.22–5.81)
RDW: 12.9 % (ref 11.5–15.5)
WBC: 9.9 10*3/uL (ref 4.0–10.5)

## 2013-10-24 NOTE — ED Provider Notes (Signed)
CSN: [id number]     Arrival date & time 10/24/13  2039 History   First MD Initiated Contact with Patient 10/24/13 2106     Chief Complaint  Patient presents with  . Loss of Consciousness     (Consider location/radiation/quality/duration/timing/severity/associated sxs/prior Treatment) Patient is a 48 y.o. male presenting with seizures. The history is provided by the patient and the spouse.  Seizures Seizure activity on arrival: no   Seizure type:  Grand mal Preceding symptoms: aura, dizziness and nausea   Preceding symptoms: no euphoria, no headache, no hyperventilation, no numbness, no panic and no vision change   Initial focality:  None Episode characteristics: abnormal movements, generalized shaking, tongue biting and unresponsiveness   Episode characteristics: no apnea, no confusion, no disorientation, no eye deviation, no focal shaking, no incontinence, no limpness, fully responsive and no stiffening   Postictal symptoms: confusion and memory loss   Postictal symptoms: no somnolence   Return to baseline: yes   Severity:  Moderate Duration:  2 minutes Timing:  Once Number of seizures this episode:  1 Progression:  Resolved Context: not alcohol withdrawal, not cerebral palsy, not change in medication, not sleeping less, not developmental delay, not drug use, not emotional upset, not family hx of seizures, not fever, not flashing visual stimuli, not hydrocephalus, not intracranial lesion, not intracranial shunt, medical compliance, not possible hypoglycemia, not possible medication ingestion, not previous head injury and not stress   Context comment:  Possible infection Recent head injury:  No recent head injuries PTA treatment:  None History of seizures: no     Pt had episode while visiting family in hospital.  No prior sz Hx.  Hx of multiple presyncopal episodes, but this was different.  Pt had 2 minutes of generalized shaking and LOC preceded by feeling "hot and nauseated".  Per  spouse pt was confused for 2 minutes afterwards.  Bit tongue during event.  No incontinence to stool or urine.  Has felt achy today, and son at home has a fever, but no other predisposing factors as far as he knows.  Denies ETOH use or other new medications.  Hx of occasional MG HAs.  Past Medical History  Diagnosis Date  . Myocardial infarct, old 1996    PCI  . CAD S/P percutaneous coronary angioplasty 1996, 2002    PCI to RCA with tandem BMS (PS 1535); 2002: Cutting Balloon atherectomy for ISR  . Hypertension     under control; has been on med. since 1996  . Hyperlipidemia LDL goal < 70   . Ulcerative colitis   . Depression 01/19/2012  . Jaw pain     with opening mouth wide  . Gout   . GERD (gastroesophageal reflux disease)   . Chronic nasal congestion   . Carpal tunnel syndrome on left 12/2011   Past Surgical History  Procedure Laterality Date  . Tonsillectomy  as a child  . Coronary stent placement  4315    [id number] BMS x 2 in RCA  . Coronary angioplasty  07/11/2000    Cutting Balloon PTCA for RCA ISR  . Carpal tunnel release  01/26/2012    Procedure: CARPAL TUNNEL RELEASE;  Surgeon: Cammie Sickle., MD;  Location: [facility];  Service: Orthopedics;  Laterality: Left;  . Cystectomy  as a child    from neck  . Nm myoview ltd  December 2012    Inferior infarct but no ischemia  . Transthoracic echocardiogram  June 2014  APH: EF 50-50%, moderate concentric LVH. Mild HK of the inferolateral wall. Gr1 DD;    Family History  Problem Relation Age of Onset  . AAA (abdominal aortic aneurysm) Father     Died from ruptured AAA despite surgery  . CAD Father   . Atrial fibrillation Father   . Aortic stenosis Mother     Status post aVR   History  Substance Use Topics  . Smoking status: Former Smoker    Quit date: 03/27/1992  . Smokeless tobacco: Current User    Types: Snuff, Chew  . Alcohol Use: No    Review of Systems  Constitutional: Negative.   HENT:  Positive for mouth sores.   Eyes: Negative.   Respiratory: Negative.   Cardiovascular: Negative.   Gastrointestinal: Positive for nausea.  Endocrine: Negative.   Genitourinary: Negative.   Musculoskeletal: Positive for myalgias.  Skin: Negative.   Allergic/Immunologic: Negative.   Neurological: Positive for dizziness, seizures and light-headedness.  Hematological: Negative.   Psychiatric/Behavioral: Positive for confusion.      Allergies  Review of patient's allergies indicates no known allergies.  Home Medications   Prior to Admission medications   Medication Sig Start Date End Date Taking? Authorizing Provider  acetaminophen (TYLENOL) 500 MG tablet Take 1,000 mg by mouth every 4 (four) hours as needed.     Yes Historical Provider, MD  atenolol (TENORMIN) 25 MG tablet Take 0.5 tablets (12.5 mg total) by mouth daily. 10/09/13  Yes [name], MD  clopidogrel (PLAVIX) 75 MG tablet Take 75 mg by mouth daily.   Yes Historical Provider, MD  docusate sodium (COLACE) 100 MG capsule Take 100 mg by mouth daily.   Yes Historical Provider, MD  esomeprazole (NEXIUM) 40 MG capsule Take 40 mg by mouth 2 (two) times daily.    Yes Historical Provider, MD  mesalamine (ASACOL) 400 MG EC tablet Take 400 mg by mouth 2 (two) times daily.    Yes Historical Provider, MD  Multiple Vitamins-Minerals (MULTIVITAMIN WITH MINERALS) tablet Take 1 tablet by mouth daily.     Yes Historical Provider, MD  ramipril (ALTACE) 5 MG capsule Take 1 capsule (5 mg total) by mouth daily. 09/25/13  Yes [name], MD  simvastatin (ZOCOR) 40 MG tablet Take 1 tablet (40 mg total) by mouth at bedtime. 09/25/13  Yes [name], MD  Vilazodone HCl (VIIBRYD) 40 MG TABS Take 40 mg by mouth daily.   Yes Historical Provider, MD   BP 148/90  Temp(Src) 97.7 F (36.5 C) (Oral)  Resp 21  SpO2 98% Physical Exam  Constitutional: He is oriented to person, place, and time. He appears well-developed and well-nourished. No  distress.  HENT:  Head: Normocephalic and atraumatic.  Right Ear: External ear normal.  Left Ear: External ear normal.  Nose: Nose normal.  Mouth/Throat: Oropharynx is clear and moist. Oral lesions present. No oropharyngeal exudate.  Small B/L tongue lacerations.  Eyes: Conjunctivae and EOM are normal. Pupils are equal, round, and reactive to light. Right eye exhibits no discharge. Left eye exhibits no discharge. No scleral icterus.  Neck: Normal range of motion. Neck supple. No JVD present. No tracheal deviation present. No thyromegaly present.  Cardiovascular: Normal rate, regular rhythm, normal heart sounds and intact distal pulses.  Exam reveals no gallop and no friction rub.   No murmur heard. Pulmonary/Chest: Effort normal and breath sounds normal. No stridor. No respiratory distress. He has no wheezes. He has no rales. He exhibits no tenderness.  Abdominal:  Soft. Bowel sounds are normal. He exhibits no distension and no mass. There is no tenderness. There is no rebound and no guarding.  Musculoskeletal: Normal range of motion. He exhibits no edema and no tenderness.  Lymphadenopathy:    He has no cervical adenopathy.  Neurological: He is alert and oriented to person, place, and time. He has normal strength and normal reflexes. He is not disoriented. He displays no atrophy, no tremor and normal reflexes. No cranial nerve deficit or sensory deficit. He exhibits normal muscle tone. He displays no seizure activity. Coordination and gait normal. GCS eye subscore is 4. GCS verbal subscore is 5. GCS motor subscore is 6. He displays no Babinski's sign on the right side. He displays no Babinski's sign on the left side.  Skin: Skin is warm and dry. No rash noted. He is not diaphoretic. No erythema. No pallor.  Psychiatric: He has a normal mood and affect. His speech is normal and behavior is normal. Judgment and thought content normal.    ED Course  Procedures (including critical care time) Labs  Review Labs Reviewed  BASIC METABOLIC PANEL - Abnormal; Notable for the following:    GFR calc non Af Amer 78 (*)    Anion gap 18 (*)    All other components within normal limits  CBC    Imaging Review Ct Head Wo Contrast  10/24/2013   CLINICAL DATA:  Syncope. Possible seizure. Prior history of MI at age 49.  EXAM: CT HEAD WITHOUT CONTRAST  TECHNIQUE: Contiguous axial images were obtained from the base of the skull through the vertex without intravenous contrast.  COMPARISON:  None.  FINDINGS: Ventricular system normal in size and appearance for age. No mass lesion. No midline shift. No acute hemorrhage or hematoma. No extra-axial fluid collections. No evidence of acute infarction. No focal brain parenchymal abnormalities.  No focal osseous abnormalities involving the skull. Minimal, insignificant mucosal thickening in the maxillary sinuses and the right frontal sinus. Remaining visualized paranasal sinuses, bilateral mastoid air cells, and bilateral middle ear cavities well-aerated.  IMPRESSION: 1. Normal intracranially. 2. Minimal, insignificant chronic bilateral maxillary and right frontal sinus disease.   Electronically Signed   By: Evangeline Dakin M.D.   On: 10/24/2013 23:09     EKG Interpretation None      MDM   Final diagnoses:  Seizure   H&P c/w generalized Sz episode.  Will evaluate for acute metabolic or infectious etiology.  Also will obtain head CT due to age w/o hx of prior Sz episodes.  No other symptoms at present to suggest alternative etiology such as cardiac, pulmonary, or other intrathoracic, or neurologic cause.   VSS, AF.  Low concern for meningitis.  W/U negative for other acute process underlying Pt sz today.  Will d/c with recommendations for PCP and neurology f/u.  Pt advised to avoid any potentially dangerous activity until cleared by a neurologist.  Pt advised to absolutely not drive until cleared as well.  Patient given return precautions for seizures.   Advised to return for worsening symptoms including chest pain, shortness of breath, severe headache, intractable nausea or vomiting, fever, or chills, inability to take medications, or other acute concerns.  Advised to follow up with PCP in 3 days, and to obtain timely neurology follow up in the next 1-2 weeks.  Patient and family in agreement with and expressed understanding of follow plan, plan of care, and return precautions.  All questions answered prior to discharge.  Patient was discharged in  stable condition, ambulating without difficulty.  Patient care was discussed with my attending, Dr. Jeanell Sparrow.   Hoyle Sauer, MD 10/25/13 [phone]

## 2013-10-24 NOTE — ED Notes (Signed)
Per rapid response, pt was visiting family in the hospital when he had an episode where his head rolled back and he began having small tremors. Pt reports that he bit his tongue. Pt denies hx of seizures. Pt reports heart attack at the age of 74 with stent placement. Pt reports that today he has been bailing hay all day and has had little to drink. Pt also reporting feeling "achy" today.

## 2013-10-25 NOTE — Discharge Instructions (Signed)
Seizure, Adult °A seizure is abnormal electrical activity in the brain. Seizures usually last from 30 seconds to 2 minutes. There are various types of seizures. °Before a seizure, you may have a warning sensation (aura) that a seizure is about to occur. An aura may include the following symptoms:  °· Fear or anxiety. °· Nausea. °· Feeling like the room is spinning (vertigo). °· Vision changes, such as seeing flashing lights or spots. °Common symptoms during a seizure include: °· A change in attention or behavior (altered mental status). °· Convulsions with rhythmic jerking movements. °· Drooling. °· Rapid eye movements. °· Grunting. °· Loss of bladder and bowel control. °· Bitter taste in the mouth. °· Tongue biting. °After a seizure, you may feel confused and sleepy. You may also have an injury resulting from convulsions during the seizure. °HOME CARE INSTRUCTIONS  °· If you are given medicines, take them exactly as prescribed by your health care provider. °· Keep all follow-up appointments as directed by your health care provider. °· Do not swim or drive or engage in risky activity during which a seizure could cause further injury to you or others until your health care provider says it is OK. °· Get adequate rest. °· Teach friends and family what to do if you have a seizure. They should: °· Lay you on the ground to prevent a fall. °· Put a cushion under your head. °· Loosen any tight clothing around your neck. °· Turn you on your side. If vomiting occurs, this helps keep your airway clear. °· Stay with you until you recover. °· Know whether or not you need emergency care. °SEEK IMMEDIATE MEDICAL CARE IF: °· The seizure lasts longer than 5 minutes. °· The seizure is severe or you do not wake up immediately after the seizure. °· You have an altered mental status after the seizure. °· You are having more frequent or worsening seizures. °Someone should drive you to the emergency department or call local emergency  services (911 in [place] °MAKE SURE YOU: °· Understand these instructions. °· Will watch your condition. °· Will get help right away if you are not doing well or get worse. °Document Released: 03/10/2000 Document Revised: 01/01/2013 Document Reviewed: 10/23/2012 °ExitCare® Patient Information ©2015 [employer]. This information is not intended to replace advice given to you by your health care provider. Make sure you discuss any questions you have with your health care provider. ° °Driving and Equipment Restrictions °Some medical problems make it dangerous to drive, ride a bike, or use machines. Some of these problems are: °· A hard blow to the head (concussion). °· Passing out (fainting). °· Twitching and shaking (seizures). °· Low blood sugar. °· Taking medicine to help you relax (sedatives). °· Taking pain medicines. °· Wearing an eye patch. °· Wearing splints. This can make it hard to use parts of your body that you need to drive safely. °HOME CARE  °· Do not drive until your doctor says it is okay. °· Do not use machines until your doctor says it is okay. °You may need a form signed by your doctor (medical release) before you can drive again. You may also need this form before you do other tasks where you need to be fully alert. °MAKE SURE YOU: °· Understand these instructions. °· Will watch your condition. °· Will get help right away if you are not doing well or get worse. °Document Released: 04/20/2004 Document Revised: 06/05/2011 Document Reviewed: 07/21/2009 °ExitCare® Patient Information ©2015 [employer].   This information is not intended to replace advice given to you by your health care provider. Make sure you discuss any questions you have with your health care provider. ° °

## 2013-10-27 NOTE — ED Provider Notes (Signed)
48 y.o. Male with new onset seizure today.  Patient with normal neuro exam here.  Work up negative for definitive cause.  Patient given seizure precautions and referred for neurology follow up.   I performed a history and physical examination of Daniel Buck and discussed his management with Dr. Estanislado Spire.  I agree with the history, physical, assessment, and plan of care, with the following exceptions: None  I was present for the following procedures: None Time Spent in Critical Care of the patient: None Time spent in discussions with the patient and family: 10  Daniel Buck Daniel Jakes, MD 10/27/13 1230

## 2014-02-06 ENCOUNTER — Encounter: Payer: Self-pay | Admitting: Physician Assistant

## 2014-02-06 ENCOUNTER — Ambulatory Visit (INDEPENDENT_AMBULATORY_CARE_PROVIDER_SITE_OTHER): Payer: 59 | Admitting: Physician Assistant

## 2014-02-06 VITALS — BP 114/86 | HR 70 | Ht 72.0 in | Wt 221.0 lb

## 2014-02-06 DIAGNOSIS — R002 Palpitations: Secondary | ICD-10-CM

## 2014-02-06 DIAGNOSIS — I10 Essential (primary) hypertension: Secondary | ICD-10-CM

## 2014-02-06 MED ORDER — ATENOLOL 25 MG PO TABS: 25.0000 mg | ORAL_TABLET | Freq: Every day | ORAL | Status: DC

## 2014-02-06 NOTE — Assessment & Plan Note (Addendum)
The last 2 weeks patient has noticed increasing heart palpitations. Typically occur during rest.  He does report that they can last up to an hour at a time but they do not occur when he is exerting himself.   When increase his atenolol to 25 mg daily. He does not have improvement in the next few weeks, will put him on an event monitor.  Follow-up in one month

## 2014-02-06 NOTE — Progress Notes (Signed)
Date:  02/06/2014   ID:  [name], DOB November 20, 1965, MRN [id number]  PCP:  [name], MD  Primary Cardiologist:  Ellyn Hack     History of Present Illness: Daniel Buck is a 48 y.o. male with a PMH of premature CAD with MI at age 66 (1996)- RCA lesion treated with 2 overlapping bare-metal stents with in-stent restenosis in April 2002 treated with cutting balloon atherectomy. Myoview in December 2012 at did show inferior scar but no ischemia.  He was last seen by Dr. Rollene Fare and May 2014 and by Dr. Ellyn Hack in June this year.   Patient reports that for the last 2 weeks feels like his heart is flipping on a frequent basis. States these episodes can last up to an hour long typically occur only when he's at rest he has also had episodes of nausea, diaphoretic and feeling like he is going to pass out.   this occurs proximally 1-2 times per day. It does not coincide with the palpitations.  He spent all day cutting wood just the other day had no symptoms whatsoever.  None of this reminds him of when he had his heart attack.  Was diagnosed with a seizure in July of this year and had a CT scan of his head which showed no intracranial abnormality.  He apparently was visiting his mother at the hospital and had a loss of consciousness with tongue biting.  He was discharged from the emergency room with recommendations to follow-up with his PCP and neurology. He was told not to drive until cleared. He has not followed up with either one of those providers.   The patient currently denies nausea, vomiting, fever, chest pain, shortness of breath, orthopnea, dizziness, PND, cough, congestion, abdominal pain, hematochezia, melena, lower extremity edema, claudication.  Wt Readings from Last 3 Encounters:  02/06/14 221 lb (100.245 kg)  08/27/13 220 lb (99.791 kg)  03/27/12 210 lb (95.255 kg)     Past Medical History  Diagnosis Date  . Myocardial infarct, old 1996    PCI  . CAD S/P percutaneous  coronary angioplasty 1996, 2002    PCI to RCA with tandem BMS (PS 1535); 2002: Cutting Balloon atherectomy for ISR  . Hypertension     under control; has been on med. since 1996  . Hyperlipidemia LDL goal < 70   . Ulcerative colitis   . Depression 01/19/2012  . Jaw pain     with opening mouth wide  . Gout   . GERD (gastroesophageal reflux disease)   . Chronic nasal congestion   . Carpal tunnel syndrome on left 12/2011    Current Outpatient Prescriptions  Medication Sig Dispense Refill  . acetaminophen (TYLENOL) 500 MG tablet Take 1,000 mg by mouth every 4 (four) hours as needed.      [name] atenolol (TENORMIN) 25 MG tablet Take 1 tablet (25 mg total) by mouth daily. 30 tablet 6  . clopidogrel (PLAVIX) 75 MG tablet Take 75 mg by mouth daily.    [name] docusate sodium (COLACE) 100 MG capsule Take 100 mg by mouth daily.    [name] esomeprazole (NEXIUM) 40 MG capsule Take 40 mg by mouth 2 (two) times daily.     . mesalamine (ASACOL) 400 MG EC tablet Take 400 mg by mouth 2 (two) times daily.     . Multiple Vitamins-Minerals (MULTIVITAMIN WITH MINERALS) tablet Take 1 tablet by mouth daily.      . ramipril (ALTACE) 5 MG capsule Take 1 capsule (5  mg total) by mouth daily. 90 capsule 1  . simvastatin (ZOCOR) 40 MG tablet Take 1 tablet (40 mg total) by mouth at bedtime. 30 tablet 5  . Vilazodone HCl (VIIBRYD) 40 MG TABS Take 40 mg by mouth daily.     No current facility-administered medications for this visit.    Allergies:   No Known Allergies  Social History:  The patient  reports that he quit smoking about 21 years ago. His smokeless tobacco use includes Snuff and Chew. He reports that he does not drink alcohol or use illicit drugs.   Family history:   Family History  Problem Relation Age of Onset  . AAA (abdominal aortic aneurysm) Father     Died from ruptured AAA despite surgery  . CAD Father   . Atrial fibrillation Father   . Aortic stenosis Mother     Status post aVR    ROS:  Please see  the history of present illness.  All other systems reviewed and negative.   PHYSICAL EXAM: VS:  BP 114/86 mmHg  Pulse 70  Ht 6' (1.829 m)  Wt 221 lb (100.245 kg)  BMI 29.97 kg/m2 Well nourished, well developed, in no acute distress HEENT: Pupils are equal round react to light accommodation extraocular movements are intact.  Neck: no JVDNo cervical lymphadenopathy. Cardiac: Regular rate and rhythm without murmurs rubs or gallops. Lungs:  clear to auscultation bilaterally, no wheezing, rhonchi or rales Abd: soft, nontender, positive bowel sounds all quadrants, no hepatosplenomegaly Ext: no lower extremity edema.  2+ radial and dorsalis pedis pulses. Skin: warm and dry Neuro:  Grossly normal  EKG:  Normal sinus rhythm rate 70 bpm  ASSESSMENT AND PLAN:  Problem List Items Addressed This Visit    Essential hypertension, benign (Chronic)    Blood pressure is well-controlled. I am increasing his atenolol to 25 mg daily in order to decrease palpitations which could be PVCs.      Relevant Medications      atenolol (TENORMIN) tablet   Other Relevant Orders      EKG 12-Lead   Heart palpitations - Primary    The last 2 weeks patient has noticed increasing heart palpitations. Typically occur during rest.  He does report that they can last up to an hour at a time but they do not occur when he is exerting himself.   When increase his atenolol to 25 mg daily. He does not have improvement in the next few weeks, will put him on an event monitor.  Follow-up in one month    Relevant Orders      EKG 12-Lead

## 2014-02-06 NOTE — Assessment & Plan Note (Addendum)
Blood pressure is well-controlled. I am increasing his atenolol to 25 mg daily in order to decrease palpitations which could be PVCs.

## 2014-02-06 NOTE — Patient Instructions (Signed)
Your physician recommends that you schedule a follow-up appointment in: 1 Month with Gaspar Bidding or Dr Ellyn Hack  Your physician has recommended you make the following change in your medication: Increase Atenolol 25 mg daily

## 2014-03-09 ENCOUNTER — Other Ambulatory Visit: Payer: Self-pay | Admitting: Cardiology

## 2014-03-09 NOTE — Telephone Encounter (Signed)
Rx refill sent to patient pharmacy   

## 2014-03-10 ENCOUNTER — Ambulatory Visit: Payer: 59 | Admitting: Physician Assistant

## 2014-03-30 ENCOUNTER — Other Ambulatory Visit: Payer: Self-pay | Admitting: Cardiology

## 2014-03-30 NOTE — Telephone Encounter (Signed)
Rx(s) sent to pharmacy electronically.  

## 2014-04-14 ENCOUNTER — Ambulatory Visit (INDEPENDENT_AMBULATORY_CARE_PROVIDER_SITE_OTHER): Payer: 59 | Admitting: Family Medicine

## 2014-04-14 ENCOUNTER — Encounter: Payer: Self-pay | Admitting: Family Medicine

## 2014-04-14 VITALS — BP 124/82 | Wt 223.0 lb

## 2014-04-14 DIAGNOSIS — G5601 Carpal tunnel syndrome, right upper limb: Secondary | ICD-10-CM

## 2014-04-14 DIAGNOSIS — G5602 Carpal tunnel syndrome, left upper limb: Secondary | ICD-10-CM

## 2014-04-14 DIAGNOSIS — G5603 Carpal tunnel syndrome, bilateral upper limbs: Secondary | ICD-10-CM

## 2014-04-14 NOTE — Progress Notes (Signed)
   Subjective:    Patient ID: Daniel Buck, male    DOB: 09/26/1965, 49 y.o.   MRN: [id number]  HPI he is here for consult concerning a right hand pain. He has a several year history of bilateral wrist carpal tunnel syndrome. Apparently he has had a previous nerve conduction study done did have surgery on that the left hand. He still is having difficulty with this however in the last 7 months he has had more difficulty with right wrist pain. He does complain of nighttime numbness and tingling as well as weakness of the right hand. He has occasionally used night splints but not on a regular basis. 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  Review of Systems     Objective:   Physical Exam Alert and in no distress. Good strength bilaterally. Tinel's test was very positive on the left. No other testing done on the left however on the right hand Tinel's test caused tingling sensation in his fourth and fifth fingers. Phalen's test was also positive. No atrophy is noted.       Assessment & Plan:  Bilateral carpal tunnel syndrome - Plan: Nerve conduction test  further discussion with him and his wife indicates that they remember some comment about possible ulnar nerve involvement. Also apparently the nerve conduction study was done and he was scheduled very quickly for surgery which has me concerned over the extent of the nerve impairment. I am concerned with potential median as well as ulnar nerve damage on the right and continued difficulty on the left. He is unhappy with the results of the surgery on the left hand. I explained that a poor result does not indicate poor surgical technique. I explained that my concern is her could be significant nerve damage and that we would need to move forward fairly quickly.

## 2014-05-05 ENCOUNTER — Encounter: Payer: Self-pay | Admitting: Family Medicine

## 2014-08-27 ENCOUNTER — Encounter: Payer: Self-pay | Admitting: Physician Assistant

## 2014-09-21 ENCOUNTER — Other Ambulatory Visit: Payer: Self-pay | Admitting: Cardiology

## 2014-11-04 ENCOUNTER — Other Ambulatory Visit: Payer: Self-pay | Admitting: Cardiology

## 2014-11-04 MED ORDER — SIMVASTATIN 40 MG PO TABS: 40.0000 mg | ORAL_TABLET | Freq: Every day | ORAL | Status: DC

## 2014-11-04 NOTE — Telephone Encounter (Signed)
Rx(s) sent to pharmacy electronically. [name] wife notified of refill and she is aware he needs an appointment.  Message sent to [facility] to schedule

## 2014-11-04 NOTE — Telephone Encounter (Signed)
°  1. Which medications need to be refilled? Simvastatin  2. Which pharmacy is medication to be sent to?[facility] [phone] 3. Do they need a 30 day or 90 day supply? 90 and refills  4. Would they like a call back once the medication has been sent to the pharmacy? yes

## 2014-11-05 ENCOUNTER — Telehealth: Payer: Self-pay | Admitting: Cardiology

## 2014-11-05 NOTE — Telephone Encounter (Signed)
Close encounter 

## 2014-12-18 ENCOUNTER — Other Ambulatory Visit: Payer: Self-pay | Admitting: Cardiology

## 2014-12-18 NOTE — Telephone Encounter (Signed)
REFILL 

## 2015-01-20 DIAGNOSIS — M19032 Primary osteoarthritis, left wrist: Secondary | ICD-10-CM | POA: Insufficient documentation

## 2015-01-20 DIAGNOSIS — G5601 Carpal tunnel syndrome, right upper limb: Secondary | ICD-10-CM | POA: Insufficient documentation

## 2015-01-28 ENCOUNTER — Other Ambulatory Visit: Payer: Self-pay | Admitting: Cardiology

## 2015-03-16 ENCOUNTER — Other Ambulatory Visit: Payer: Self-pay | Admitting: Cardiology

## 2015-04-06 MED FILL — VIIBRYD 40 MG TABLET: 40 | 30 days supply | Qty: 30 | Fill #4

## 2015-04-07 ENCOUNTER — Other Ambulatory Visit (INDEPENDENT_AMBULATORY_CARE_PROVIDER_SITE_OTHER): Payer: 59

## 2015-04-07 DIAGNOSIS — Z23 Encounter for immunization: Secondary | ICD-10-CM

## 2015-04-19 DIAGNOSIS — G5601 Carpal tunnel syndrome, right upper limb: Secondary | ICD-10-CM | POA: Diagnosis not present

## 2015-04-26 ENCOUNTER — Other Ambulatory Visit: Payer: Self-pay | Admitting: Cardiology

## 2015-04-26 MED FILL — SIMVASTATIN 40 MG TABLET: 40 | 90 days supply | Qty: 90 | Fill #0

## 2015-04-26 MED FILL — ALLOPURINOL 300 MG TABLET: 300 | 90 days supply | Qty: 90 | Fill #2

## 2015-04-26 MED FILL — CLOPIDOGREL 75 MG TABLET: 75 | 90 days supply | Qty: 90 | Fill #0

## 2015-04-26 MED FILL — RAMIPRIL 5 MG CAPSULE: 5 | 30 days supply | Qty: 30 | Fill #0 | Status: TO

## 2015-04-26 NOTE — Telephone Encounter (Signed)
Rx request sent to pharmacy.  

## 2015-05-03 ENCOUNTER — Other Ambulatory Visit: Payer: Self-pay | Admitting: Physician Assistant

## 2015-05-03 MED FILL — VIIBRYD 40 MG TABLET: 40 | 30 days supply | Qty: 30 | Fill #5

## 2015-05-03 MED FILL — ATENOLOL 25 MG TABLET: 25 | 90 days supply | Qty: 90 | Fill #0

## 2015-06-01 MED FILL — BUDESONIDE EC 3 MG CAPSULE: 3 | 30 days supply | Qty: 90 | Fill #0

## 2015-06-02 MED FILL — VIIBRYD 40 MG TABLET: 40 | 30 days supply | Qty: 30 | Fill #6

## 2015-06-02 MED FILL — ESOMEPRAZOLE MAG DR 40 MG C: 40 | 90 days supply | Qty: 180 | Fill #0

## 2015-07-02 ENCOUNTER — Other Ambulatory Visit: Payer: Self-pay | Admitting: *Deleted

## 2015-07-02 MED ORDER — RAMIPRIL 5 MG PO CAPS: 5.0000 mg | ORAL_CAPSULE | Freq: Every day | ORAL | Status: DC

## 2015-07-02 MED FILL — VIIBRYD 40 MG TABLET: 40 | 30 days supply | Qty: 30 | Fill #7

## 2015-07-02 MED FILL — RAMIPRIL 5 MG CAPSULE: 5 | 90 days supply | Qty: 90 | Fill #0

## 2015-07-02 NOTE — Telephone Encounter (Signed)
Rx request sent to pharmacy.  

## 2015-07-28 MED FILL — CLOPIDOGREL 75 MG TABLET: 75 | 90 days supply | Qty: 90 | Fill #1

## 2015-07-29 ENCOUNTER — Ambulatory Visit (INDEPENDENT_AMBULATORY_CARE_PROVIDER_SITE_OTHER): Payer: 59 | Admitting: Cardiology

## 2015-07-29 ENCOUNTER — Encounter: Payer: Self-pay | Admitting: Cardiology

## 2015-07-29 VITALS — BP 120/88 | HR 61 | Ht 71.0 in | Wt 226.2 lb

## 2015-07-29 DIAGNOSIS — E785 Hyperlipidemia, unspecified: Secondary | ICD-10-CM | POA: Diagnosis not present

## 2015-07-29 DIAGNOSIS — I2510 Atherosclerotic heart disease of native coronary artery without angina pectoris: Secondary | ICD-10-CM

## 2015-07-29 DIAGNOSIS — Z9861 Coronary angioplasty status: Secondary | ICD-10-CM

## 2015-07-29 DIAGNOSIS — I252 Old myocardial infarction: Secondary | ICD-10-CM

## 2015-07-29 DIAGNOSIS — R002 Palpitations: Secondary | ICD-10-CM

## 2015-07-29 DIAGNOSIS — I10 Essential (primary) hypertension: Secondary | ICD-10-CM | POA: Diagnosis not present

## 2015-07-29 DIAGNOSIS — R413 Other amnesia: Secondary | ICD-10-CM

## 2015-07-29 MED ORDER — CLOPIDOGREL BISULFATE 75 MG PO TABS: 75.0000 mg | ORAL_TABLET | Freq: Every day | ORAL | Status: DC

## 2015-07-29 MED ORDER — ATENOLOL 25 MG PO TABS: ORAL_TABLET | ORAL | Status: DC

## 2015-07-29 MED ORDER — SIMVASTATIN 40 MG PO TABS: 40.0000 mg | ORAL_TABLET | Freq: Every day | ORAL | Status: DC

## 2015-07-29 MED ORDER — RAMIPRIL 5 MG PO CAPS: 5.0000 mg | ORAL_CAPSULE | Freq: Every day | ORAL | Status: DC

## 2015-07-29 MED FILL — SIMVASTATIN 40 MG TABLET: 40 | 90 days supply | Qty: 90 | Fill #1

## 2015-07-29 MED FILL — ATENOLOL 25 MG TABLET: 25 | 90 days supply | Qty: 45 | Fill #0

## 2015-07-29 NOTE — Assessment & Plan Note (Signed)
No further issues.  PRN additional 12.5 mg Atenolol.  Otherwise, continue with 12.5 mg as ordered.

## 2015-07-29 NOTE — Progress Notes (Signed)
PCP: Daniel Neer, MD  Clinic Note: Chief Complaint  Patient presents with  . Annual Exam    last visit 2015, need medication refill, no chest pain , no sob , no swelling  . Coronary Artery Disease    IMI - PCI RCA    HPI: Daniel Buck is a 50 y.o. male with a PMH below who presents today for delayed follow-up for CAD. He is a former patient of Daniel Buck, otherwise saw last in June 2015.  He has a history of premature CAD at age 31 with a inferior MI. Overlapping BMS to the RCA.. -->   He had cutting balloon atherectomy in April 2002.   Myoview in 2012 showed inferior scar but no ischemia.   He was supposed to have a stress test in June 2015 that was never done.  Daniel Buck was last seen on 02/06/2014 by Daniel Fuller, PA. At that time she was noticing flip-flopping in her chest/palpitations. These episodes would happen couple times a day. They last about 12 hour period they usually happen at rest associated with nausea and diaphoresis. Was diagnosed with a seizure disorder in July 2015. --> Increased atenolol to 25 mg. Event monitor ordered. Plan was to follow-up in one month. This was not done.  Recent Hospitalizations: None  Studies Reviewed: None  Interval History: Daniel Buck returns today b/c he needs his medications refilled.  He has not major complaints.   Pretty active - goes [place] hunting.  No routine exercise - got out of habit. He really has not noted any significant palpitations recently, so he cut back down to 1/2 dose of Atenolol (12.5 mg now) due to c/o fatigue & lethargy on the 25 mg dose. Despite this, the palpitations did not return. Cardiovascular Review of Symptoms:  No chest pain or shortness of breath with rest or exertion.   No PND, orthopnea or edema.   No palpitations, lightheadedness, dizziness, weakness or syncope/near syncope.  No TIA/amaurosis fugax symptoms.  No melena, hematochezia, hematuria, or epstaxis.  No claudication.  ROS: A  comprehensive was performed. Review of Systems  Constitutional: Negative for fever and chills.  HENT: Negative for congestion and nosebleeds.        No serious issues with pollen  Respiratory: Negative for cough, shortness of breath and wheezing.   Gastrointestinal: Negative for heartburn, constipation, blood in stool and melena.  Genitourinary: Negative for hematuria.  Musculoskeletal: Negative for myalgias and joint pain.  Neurological: Negative for dizziness.  Endo/Heme/Allergies: Does not bruise/bleed easily.  Psychiatric/Behavioral: Negative for depression and memory loss. The patient is not nervous/anxious and does not have insomnia.   All other systems reviewed and are negative.    Past Medical History  Diagnosis Date  . Myocardial infarct, old 1996    PCI  . CAD S/P percutaneous coronary angioplasty 1996, 2002    PCI to RCA with tandem BMS (PS 1535); 2002: Cutting Balloon atherectomy for ISR  . Hypertension     under control; has been on med. since 1996  . Hyperlipidemia LDL goal < 70   . Ulcerative colitis ([place])   . Depression 01/19/2012  . Jaw pain     with opening mouth wide  . Gout   . GERD (gastroesophageal reflux disease)   . Chronic nasal congestion   . Carpal tunnel syndrome on left 12/2011    Past Surgical History  Procedure Laterality Date  . Tonsillectomy  as a child  . Coronary stent placement  1996  [id number] BMS x 2 in RCA  . Coronary angioplasty  07/11/2000    Cutting Balloon PTCA for RCA ISR  . Carpal tunnel release  01/26/2012    Procedure: CARPAL TUNNEL RELEASE;  Surgeon: Daniel Buck., MD;  Location: [facility];  Service: Orthopedics;  Laterality: Left;  . Cystectomy  as a child    from neck  . Nm myoview ltd  December 2012    Inferior infarct but no ischemia  . Transthoracic echocardiogram  June 2014    APH: EF 50-50%, moderate concentric LVH. Mild HK of the inferolateral wall. Gr1 DD;     Prior to Admission medications    Medication Sig Start Date End Date Taking? Authorizing Provider  acetaminophen (TYLENOL) 500 MG tablet Take 1,000 mg by mouth every 4 (four) hours as needed.     Yes Historical Provider, MD  atenolol (TENORMIN) 25 MG tablet TAKE 1 TABLET BY MOUTH DAILY. Patient taking differently: TAKE 12.5 mg TABLET BY MOUTH DAILY. 05/03/15  Yes [name], MD  clopidogrel (PLAVIX) 75 MG tablet TAKE 1 TABLET BY MOUTH ONCE DAILY 04/26/15  Yes [name], MD  docusate sodium (COLACE) 100 MG capsule Take 100 mg by mouth daily.   Yes Historical Provider, MD  esomeprazole (NEXIUM) 40 MG capsule Take 40 mg by mouth 2 (two) times daily.    Yes Historical Provider, MD  mesalamine (LIALDA) 1.2 g EC tablet Take 1.2 g by mouth 2 (two) times daily.   Yes Historical Provider, MD  Multiple Vitamins-Minerals (MULTIVITAMIN WITH MINERALS) tablet Take 1 tablet by mouth daily.     Yes Historical Provider, MD  ramipril (ALTACE) 5 MG capsule Take 1 capsule (5 mg total) by mouth daily. 07/02/15  Yes [name], MD  simvastatin (ZOCOR) 40 MG tablet TAKE 1 TABLET BY MOUTH AT BEDTIME. 04/26/15  Yes [name], MD  Vilazodone HCl (VIIBRYD) 40 MG TABS Take 40 mg by mouth daily.   Yes Historical Provider, MD  allopurinol (ZYLOPRIM) 300 MG tablet 300 mg daily.  04/26/15   Historical Provider, MD    No Known Allergies   Social History   Social History  . Marital Status: Married    Spouse Name: N/A  . Number of Children: N/A  . Years of Education: N/A   Social History Main Topics  . Smoking status: Former Smoker    Quit date: 03/27/1992  . Smokeless tobacco: Current User    Types: Snuff, Chew  . Alcohol Use: No  . Drug Use: No  . Sexual Activity: Not Asked   Other Topics Concern  . None   Social History Narrative   He lives in [place], [place] near [place] with his wife. [place]. He is extremely active around [place], but does not have a routine exercise regimen.   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   He quit smoking in 1994.   Family History family history includes AAA (abdominal aortic aneurysm) in his father; Aortic stenosis in his mother; Atrial fibrillation in his father; CAD in his father.   Wt Readings from Last 3 Encounters:  07/29/15 226 lb 3.2 oz (102.604 kg)  04/14/14 223 lb (101.152 kg)  02/06/14 221 lb (100.245 kg)    PHYSICAL EXAM BP 120/88 mmHg  Pulse 61  Ht 5' 11"  (1.803 m)  Wt 226 lb 3.2 oz (102.604 kg)  BMI 31.56 kg/m2 General appearance: alert, cooperative, appears stated  age, no distress and Pleasant mood and affect. Very healthy-appearing. Minimal truncal obesity HEENT: [place]/AT, EOMI, MMM, anicteric sclera Neck: no adenopathy, no carotid bruit and no JVD Lungs: clear to auscultation bilaterally, normal percussion bilaterally and Nonlabored, good air movement Heart: RRR, normal S1 and S2. Soft possible systolic murmur along the sternal border. I think this is more was breath sounds though. Nondisplaced PMI. No other M//R/G. Abdomen: soft, non-tender; bowel sounds normal; no masses, no organomegaly Extremities: extremities normal, atraumatic, no cyanosis or edema and no ulcers, gangrene or trophic changes Pulses: 2+ and symmetric Neurologic: Alert and oriented X 3, normal strength and tone. Normal symmetric reflexes. Normal coordination and gait; CN II through XII grossly intact    Adult ECG Report  Rate: 61 ;  Rhythm: normal sinus rhythm and Left Axis Deviation (-37).  Otherwise normal Axis, intervals & durations.;   Narrative Interpretation: Stable EKG   Other studies Reviewed: Additional studies/ records that were reviewed today include:  Recent Labs:  June 2016 -- TC 165, TG 250, HDL 27 (L), LDL 87.   ASSESSMENT / PLAN: Problem List Items Addressed This Visit    Myocardial infarct, old (Chronic)    Infarct noted on Myoview in 2012, but preserved EF. No further Angina/CHF symptoms since ISR PTCA in 2002.      Relevant  Medications   simvastatin (ZOCOR) 40 MG tablet   ramipril (ALTACE) 5 MG capsule   atenolol (TENORMIN) 25 MG tablet   Hyperlipidemia LDL goal <70 (Chronic)    Last check was in June 2016.  On Simvastatin - due for recheck this summer. HDL was low & LDL not quite @ goal last year - may need stronger coverage.  With memory issues, I would like for him to hold Simvastatin x 1 month after f/u labs checked to assess for change in memory.  If he notes an improvement, would change to Crestor 76m.      Relevant Medications   simvastatin (ZOCOR) 40 MG tablet   ramipril (ALTACE) 5 MG capsule   atenolol (TENORMIN) 25 MG tablet   Other Relevant Orders   EKG 12-Lead   Heart palpitations    No further issues.  PRN additional 12.5 mg Atenolol.  Otherwise, continue with 12.5 mg as ordered.      Essential hypertension, benign (Chronic)    Stable - well controlled on BB & ACE-I. No change      Relevant Medications   simvastatin (ZOCOR) 40 MG tablet   ramipril (ALTACE) 5 MG capsule   atenolol (TENORMIN) 25 MG tablet   Other Relevant Orders   EKG 12-Lead   CAD S/P percutaneous coronary angioplasty - Primary (Chronic)    2 BMS in RCA in '96 with ISR PTCA in 2002 - no issues since.  Non-ischemic Myoview in 2012. Remains on Plavix w/o ASA. On Statin, BB & ACE-I. Remains active, but no routine exercise. No change      Relevant Medications   simvastatin (ZOCOR) 40 MG tablet   ramipril (ALTACE) 5 MG capsule   atenolol (TENORMIN) 25 MG tablet   Other Relevant Orders   EKG 12-Lead    Other Visit Diagnoses    Memory loss        Relevant Orders    EKG 12-Lead       Current medicines are reviewed at length with the patient today. (+/- concerns) needs refills.  The following changes have been made:   Once PCP checks Labs this summer (anticipated in June) -  Hold Simvastatin x ~1 month to reassess memory issues.  If improved, we will need to consider changing to Crestor. If no change,  simply restart.   ROV 1 yr.  Studies Ordered:   Orders Placed This Encounter  Procedures  . EKG 12-Lead      Leonie Buck, M.D., M.S. Interventional Cardiologist   Pager # [phone] Phone # [phone] [address] [place], [place] [address]

## 2015-07-29 NOTE — Assessment & Plan Note (Signed)
Last check was in June 2016.  On Simvastatin - due for recheck this summer. HDL was low & LDL not quite @ goal last year - may need stronger coverage.  With memory issues, I would like for him to hold Simvastatin x 1 month after f/u labs checked to assess for change in memory.  If he notes an improvement, would change to Crestor 26m.

## 2015-07-29 NOTE — Assessment & Plan Note (Signed)
Stable - well controlled on BB & ACE-I. No change

## 2015-07-29 NOTE — Assessment & Plan Note (Signed)
2 BMS in RCA in '96 with ISR PTCA in 2002 - no issues since.  Non-ischemic Myoview in 2012. Remains on Plavix w/o ASA. On Statin, BB & ACE-I. Remains active, but no routine exercise. No change

## 2015-07-29 NOTE — Assessment & Plan Note (Signed)
Improved with going back to 12.5 mg Atenolol.

## 2015-07-29 NOTE — Patient Instructions (Signed)
Pleas have primary to send labs in June 2017  After you have labs drawn for cholesterol in June 2017, stop your simvastatin for 1 month - to see if memory loss gets better And call office.  No changes with current medications   Your physician wants you to follow-up in 12 months with DR HARDING.  You will receive a reminder letter in the mail two months in advance. If you don't receive a letter, please call our office to schedule the follow-up appointment.   If you need a refill on your cardiac medications before your next appointment, please call your pharmacy.

## 2015-07-29 NOTE — Assessment & Plan Note (Signed)
Infarct noted on Myoview in 2012, but preserved EF. No further Angina/CHF symptoms since ISR PTCA in 2002.

## 2015-08-02 DIAGNOSIS — K5190 Ulcerative colitis, unspecified, without complications: Secondary | ICD-10-CM | POA: Diagnosis not present

## 2015-08-02 DIAGNOSIS — K219 Gastro-esophageal reflux disease without esophagitis: Secondary | ICD-10-CM | POA: Diagnosis not present

## 2015-08-02 MED FILL — ANUCORT-HC 25 MG SUPP: 25 | 15 days supply | Qty: 30 | Fill #0

## 2015-08-02 MED FILL — LIALDA 1.2 GM TABLET SA: 1.2 | 30 days supply | Qty: 120 | Fill #0

## 2015-08-02 MED FILL — ALLOPURINOL 300 MG TABLET: 300 | 90 days supply | Qty: 90 | Fill #3

## 2015-08-02 MED FILL — VIIBRYD 40 MG TABLET: 40 | 30 days supply | Qty: 30 | Fill #8

## 2015-08-03 ENCOUNTER — Telehealth: Payer: Self-pay | Admitting: *Deleted

## 2015-08-03 NOTE — Telephone Encounter (Signed)
Request for surgical clearance:  1. What type of surgery is being performed? COLONOSCOPY-FOR ULCERATIVE COLITIS  2. When is this surgery scheduled? 09/16/2015   3. Are there any medications that need to be held prior to surgery and how long? PLAVIX  4. Name of physician performing surgery?  DR MAGOD  5. What is your office phone and fax number?  PHONE [phone],FAX [phone].

## 2015-08-04 NOTE — Telephone Encounter (Signed)
Okay to hold Plavix for 5 days preprocedure.   [name], MD

## 2015-08-05 NOTE — Telephone Encounter (Signed)
Routed to Dr Perley Jain office

## 2015-08-13 DIAGNOSIS — G5601 Carpal tunnel syndrome, right upper limb: Secondary | ICD-10-CM | POA: Diagnosis not present

## 2015-09-09 MED FILL — VIIBRYD 40 MG TABLET: 40 | 30 days supply | Qty: 30 | Fill #0

## 2015-09-13 ENCOUNTER — Telehealth: Payer: Self-pay | Admitting: Cardiology

## 2015-09-13 NOTE — Telephone Encounter (Signed)
Spoke to [place]. Pt added to [facility] appt list for Thursday, June 22nd at 12:15p. Called patient back, no answer. Called wife, gave her appt info - advised to call if this appt won't work, but she stated this should be fine.  Advised to call if more urgent concerns prior to appt. Discussed getting home BP cuff at pharmacy to track blood pressures & HR at home, I gave wife recommendations for cuff features at her request.  They are aware of appt info and to call if further needs or concerns.

## 2015-09-13 NOTE — Telephone Encounter (Signed)
New Message  PT wife call to schedule appt for pt. Wife states pt needs a soon appt. She states pt has been c/o   Pt c/o Shortness Of Breath: STAT if SOB developed within the last 24 hours or pt is noticeably SOB on the phone  1. Are you currently SOB (can you hear that pt is SOB on the phone)? Pt wife states no/ off and on. Pt not present   2. How long have you been experiencing SOB? Pt wife states a weeks  3. Are you SOB when sitting or when up moving around? Sitting around  4. Are you currently experiencing any other symptoms? Lightheadedness, dizziness, sick to stomach

## 2015-09-13 NOTE — Telephone Encounter (Signed)
Looks like a good plan

## 2015-09-13 NOTE — Telephone Encounter (Signed)
Spoke to patient.  States for about the past 2 weeks, sometimes he breaks out in a sweat "for no reason". Sometimes when he is sitting and stands up he gets "swimmy headed, like I'm gonna pass out", & states "sometimes I have trouble catching my breath after walking 20 feet". Had tightness in chest the other day, accompanying SOB, at rest. This went away after 15 mins and he hasn't had any repeat episodes. The other day, felt like he's got something hung in his throat, not feeling this today. Other than that, no chest pain. No c/o pain generally. No swelling, no increase in fatigue. No true syncope.  Patient reports no BP readings, states HR runs 70-80 generally when checked, he denies rapid beats/palpitations.  Notes he's been lifting weights since June 1st.  Has been more ambulatory past 2-3 weeks, no complaints when walking. He states he can walk 2 miles a day w/ no problems. Emphasizes that he has no symptoms when he is active and exercising.  Pt aware I am not sure how to advise, and will confer with Dr. Ellyn Hack on this, but likely recommendation for APP visit.

## 2015-09-15 ENCOUNTER — Encounter: Payer: Self-pay | Admitting: Physician Assistant

## 2015-09-15 MED FILL — ESOMEPRAZOLE MAG DR 40 MG C: 40 | 90 days supply | Qty: 180 | Fill #0

## 2015-09-15 NOTE — Progress Notes (Signed)
Cardiology Office Note:    Date:  09/16/2015   ID:  [name], DOB May 05, 1965, MRN [id number]  PCP:  [name], MD  Cardiologist:  Dr. Glenetta Hew   Electrophysiologist:  n/a  Referring MD: [name], MD   Chief Complaint  Patient presents with  . Shortness of Breath    History of Present Illness:     Daniel Buck is a 50 y.o. male with a hx of premature CAD, status post inferior MI at age 25 treated with overlapping BMS to the RCA, HTN, HL, palpitations controlled with beta blocker. Last cardiac catheterization in 2002 demonstrated 75% in-stent restenosis in the RCA which was treated with cutting balloon angioplasty. Records indicate he underwent nuclear stress test in 2012 demonstrated inferior scar but no ischemia. Echocardiogram in 2014 demonstrated EF [id number], mild diastolic dysfunction and moderate concentric LVH. Last seen by Dr. Ellyn Hack 07/29/15.  He called in recently with complaints of chest pain and dyspnea and was added on for evaluation.  He is here with his wife today.  He notes symptoms of dyspnea with some activities.  However, he can exercise without chest pain or dyspnea.  He mainly notes dyspnea if he gets up quickly after sitting for a long time.  He really denies chest pain to me today.  But, he has had some jaw pain and noted some pain at the base of his neck last week.  This was not exertional and has resolved.  He denies syncope, orthopnea, PND, edema.  He does get lightheaded with standing quickly.  He does note that he can sweat profusely at times and it almost seems like he is having hot flashes.  He denies diarrhea, constipation, significant fatigue, skin or hair changes. He has not started any new medications.    Past Medical History  Diagnosis Date  . Myocardial infarct, old 1996    PCI  . CAD S/P percutaneous coronary angioplasty 1996, 2002    a. s/p Inf MI (age 42) >>PCI to RCA with tandem BMS (PS 1535); LHC 06/2000: pRCA 30-40%, prox/mid  stents with 75% ISR, 30-40% at crux in RCA >> PCI: Cutting Balloon atherectomy for ISR  . Hypertension     under control; has been on med. since 1996  . Hyperlipidemia LDL goal < 70   . Ulcerative colitis ([facility])   . Depression 01/19/2012  . Gout   . GERD (gastroesophageal reflux disease)   . Chronic nasal congestion   . Carpal tunnel syndrome on left 12/2011  . History of echocardiogram     a. Echo 6/14: mod LVH, EF 50-55%, inf-lat HK, Gr 1 DD, mild LAE  . History of Doppler ultrasound     a. Carotid US 3/11: no ICA stenosis     Past Surgical History  Procedure Laterality Date  . Tonsillectomy  as a child  . Coronary stent placement  [id number] BMS x 2 in RCA  . Coronary angioplasty  07/11/2000    Cutting Balloon PTCA for RCA ISR  . Carpal tunnel release  01/26/2012    Procedure: CARPAL TUNNEL RELEASE;  Surgeon: Cammie Sickle., MD;  Location: [facility];  Service: Orthopedics;  Laterality: Left;  . Cystectomy  as a child    from neck  . Nm myoview ltd  December 2012    Inferior infarct but no ischemia  . Transthoracic echocardiogram  June 2014    APH: EF 50-50%, moderate concentric LVH. Mild HK of  the inferolateral wall. Gr1 DD;     Current Medications: Outpatient Prescriptions Prior to Visit  Medication Sig Dispense Refill  . acetaminophen (TYLENOL) 500 MG tablet Take 1,000 mg by mouth every 4 (four) hours as needed for mild pain or moderate pain.     [name] allopurinol (ZYLOPRIM) 300 MG tablet Take 300 mg by mouth.   3  . atenolol (TENORMIN) 25 MG tablet TAKE 12.5 mg TABLET BY MOUTH DAILY. 45 tablet 3  . clopidogrel (PLAVIX) 75 MG tablet Take 1 tablet (75 mg total) by mouth daily. 90 tablet 3  . docusate sodium (COLACE) 100 MG capsule Take 100 mg by mouth daily.    [name] esomeprazole (NEXIUM) 40 MG capsule Take 40 mg by mouth 2 (two) times daily.     . mesalamine (LIALDA) 1.2 g EC tablet Take 1.2 g by mouth 2 (two) times daily.    . Multiple Vitamins-Minerals  (MULTIVITAMIN WITH MINERALS) tablet Take 1 tablet by mouth daily.      . ramipril (ALTACE) 5 MG capsule Take 1 capsule (5 mg total) by mouth daily. 90 capsule 3  . simvastatin (ZOCOR) 40 MG tablet Take 1 tablet (40 mg total) by mouth at bedtime. 90 tablet 3  . Vilazodone HCl (VIIBRYD) 40 MG TABS Take 40 mg by mouth daily.     No facility-administered medications prior to visit.      Allergies:   Review of patient's allergies indicates no known allergies.   Social History   Social History  . Marital Status: Married    Spouse Name: N/A  . Number of Children: N/A  . Years of Education: N/A   Social History Main Topics  . Smoking status: Former Smoker    Quit date: 03/27/1992  . Smokeless tobacco: Current User    Types: Snuff, Chew  . Alcohol Use: No  . Drug Use: No  . Sexual Activity: Not Asked   Other Topics Concern  . None   Social History Narrative   He lives in [place], [place] near [place] with his wife. [place]. He is extremely active around [place], but does not have a routine exercise regimen.   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   He quit smoking in 1994.     Family History:  The patient's family history includes AAA (abdominal aortic aneurysm) in his father; Aortic stenosis in his mother; Atrial fibrillation in his father; CAD in his father.   ROS:   Please see the history of present illness.    Review of Systems  Constitution: Positive for diaphoresis.  Neurological: Positive for dizziness.  All other systems reviewed and are negative.   Physical Exam:    VS:  BP 122/92 mmHg  Pulse 80  Ht 5\' 11"  (1.803 m)  Wt 220 lb 12.8 oz (100.154 kg)  BMI 30.81 kg/m2  SpO2 97%   Physical Exam  Constitutional: He is oriented to person, place, and time. He appears well-developed and well-nourished.  HENT:  Head: Normocephalic and atraumatic.  Neck: Normal range of motion. No JVD present. Carotid bruit is not present. No thyromegaly present.    Cardiovascular: Normal rate, regular rhythm, S1 normal and S2 normal.   No murmur heard. Pulmonary/Chest: Effort normal and breath sounds normal. He has no wheezes. He has no rales.  Abdominal: Soft. He exhibits no mass. There is no tenderness.  Musculoskeletal: Normal range of motion. He exhibits no edema.  Neurological: He is alert and  oriented to person, place, and time.  Skin: Skin is warm and dry.  Psychiatric: He has a normal mood and affect.    Wt Readings from Last 3 Encounters:  09/16/15 220 lb 12.8 oz (100.154 kg)  07/29/15 226 lb 3.2 oz (102.604 kg)  04/14/14 223 lb (101.152 kg)      Studies/Labs Reviewed:     EKG:  EKG is  ordered today.  The ekg ordered today demonstrates NSR, HR 79, LAD, QTc 424 ms, no change from prior tracing.   Recent Labs: No results found for requested labs within last 365 days.   Recent Lipid Panel    Component Value Date/Time   CHOL 137 02/02/2011 1022   TRIG 198* 02/02/2011 1022   HDL 25* 02/02/2011 1022   CHOLHDL 5.5 02/02/2011 1022   VLDL 40 02/02/2011 1022   LDLCALC 72 02/02/2011 1022    Additional studies/ records that were reviewed today include:   Echo 6/14 - Left ventricle: The cavity size was mildly reduced. Wall   thickness was increased in a pattern of moderate LVH.   There was moderate concentric hypertrophy. Systolic   function was normal. The estimated ejection fraction was   in the range of 50% to 55%. Mild hypokinesis of the   inferolateral myocardium. Doppler parameters are   consistent with abnormal left ventricular relaxation   (grade 1 diastolic dysfunction). - Left atrium: The atrium was mildly dilated. - Atrial septum: No defect or patent foramen ovale was   identified.  Carotid US 3/11 1.  No significant carotid plaque or stenosis.  LHC 4/02 LM angiographically normal  LAD normal. LCx angiographically normal RCA 30 to 40% narrowing in the most proximal segment before the proximal to mid tandem  stents. There was diffuse narrowing within the proximal portion of the stent narrowing up to 75% somewhat eccentrically in the mid distal portion of this proximal to mid tandem [id number] stent. There also was 30 to 40% smooth narrowing in the region of the crux. LV gram: normal global LV function with mild residual hypokinesis in the mid diaphragmatic segment and in the lower posterolateral wall. IMPRESSION: 1. Normal left ventricular function with minimal residual inferior mid  hypocontractility as well as lower posterolateral hypocontractility. 2. A 50 to 75% in-stent restenosis in the proximal to mid right coronary  artery previous tandem stents placed in 1996 with 30 to 40% smooth  narrowing in the distal right coronary artery. 3. Successful cutting balloon intervention in the stented segment with a  3.75 x 15 mm cutting balloon with percent diameter stenosis being  reduced to 0% and no evidence for dissection. (Double bolus Integrilin and  weight-adjusted heparinization.)   ASSESSMENT:     1. Shortness of breath   2. CAD S/P percutaneous coronary angioplasty   3. Essential hypertension, benign   4. Hyperlipidemia LDL goal <70   5. Excessive sweating     PLAN:     In order of problems listed above:  1. Shortness of breath - Symptoms of dyspnea are little atypical for ischemia.  He has noted some jaw and throat pain that is not exertional. But, he did have throat pain with his MI in 1996.  This was not as bad.  He had L arm pain in 1996 as well and has not had any arm pain. His ECG is unchanged. His last stress test was in 2012.  He does not have much faith in stress testing.  He had a friend have an  MI after a normal stress test.  We discussed the indications and benefits of stress testing.  I also pointed out the risk associated with cardiac cath.  At this point, I think that stress testing is the best option for evaluation of his symptoms.   -  Schedule ETT-Myoview  -   FU with Dr. Glenetta Hew in 3-4 weeks.  2.  CAD - Arrange stress nuc study as noted.  Continue Plavix, statin, low dose beta-blocker.  3. HTN - Borderline elevated BP today. Continue to monitor.  4. HL - Continue statin.  LDL in 6/16 was 87.    5. Diaphoresis - If stress test ok, would rec FU with PCP to check thyroid, testosterone, etc.    Medication Adjustments/Labs and Tests Ordered: Current medicines are reviewed at length with the patient today.  Concerns regarding medicines are outlined above.  Medication changes, Labs and Tests ordered today are outlined in the Patient Instructions noted below. Patient Instructions  Medication Instructions:  No changes. See your medication list. Labwork: None today. Testing/Procedures: Schedule an Exercise Nuclear Stress Test (Exercise Myoview). Follow-Up: Dr. Glenetta Hew in 3-4 weeks. Any Other Special Instructions Will Be Listed Below (If Applicable). If your stress test is normal, follow up with your primary care doctor to check your thyroid, testosterone, etc to further evaluate your sweatiness. If you need a refill on your cardiac medications before your next appointment, please call your pharmacy.    Signed, Richardson Dopp, PA-C  09/16/2015 1:17 PM    [facility] [address], [place], [place]  [address] Phone: [phone]; Fax: [phone]

## 2015-09-16 ENCOUNTER — Ambulatory Visit (INDEPENDENT_AMBULATORY_CARE_PROVIDER_SITE_OTHER): Payer: 59 | Admitting: Physician Assistant

## 2015-09-16 ENCOUNTER — Encounter: Payer: Self-pay | Admitting: Physician Assistant

## 2015-09-16 VITALS — BP 122/92 | HR 80 | Ht 71.0 in | Wt 220.8 lb

## 2015-09-16 DIAGNOSIS — R0602 Shortness of breath: Secondary | ICD-10-CM | POA: Diagnosis not present

## 2015-09-16 DIAGNOSIS — I10 Essential (primary) hypertension: Secondary | ICD-10-CM

## 2015-09-16 DIAGNOSIS — R0789 Other chest pain: Secondary | ICD-10-CM

## 2015-09-16 DIAGNOSIS — I2510 Atherosclerotic heart disease of native coronary artery without angina pectoris: Secondary | ICD-10-CM | POA: Diagnosis not present

## 2015-09-16 DIAGNOSIS — R61 Generalized hyperhidrosis: Secondary | ICD-10-CM

## 2015-09-16 DIAGNOSIS — E785 Hyperlipidemia, unspecified: Secondary | ICD-10-CM

## 2015-09-16 DIAGNOSIS — Z9861 Coronary angioplasty status: Secondary | ICD-10-CM

## 2015-09-16 NOTE — Patient Instructions (Addendum)
Medication Instructions:  No changes. See your medication list. Labwork: None today. Testing/Procedures: Schedule an Exercise Nuclear Stress Test (Exercise Myoview). Follow-Up: Dr. Glenetta Hew in 3-4 weeks. Any Other Special Instructions Will Be Listed Below (If Applicable). If your stress test is normal, follow up with your primary care doctor to check your thyroid, testosterone, etc to further evaluate your sweatiness. If you need a refill on your cardiac medications before your next appointment, please call your pharmacy.

## 2015-09-22 ENCOUNTER — Telehealth (HOSPITAL_COMMUNITY): Payer: Self-pay | Admitting: *Deleted

## 2015-09-22 NOTE — Telephone Encounter (Signed)
Left message on voicemail per DPR in reference to upcoming appointment scheduled on 7/3/17with detailed instructions given per Myocardial Perfusion Study Information Sheet for the test. LM to arrive 15 minutes early, and that it is imperative to arrive on time for appointment to keep from having the test rescheduled. If you need to cancel or reschedule your appointment, please call the office within 24 hours of your appointment. Failure to do so may result in a cancellation of your appointment, and a $50 no show fee. Phone number given for call back for any questions. Hubbard Robinson, RN

## 2015-09-27 ENCOUNTER — Encounter (HOSPITAL_COMMUNITY): Payer: 59

## 2015-09-29 MED FILL — RAMIPRIL 5 MG CAPSULE: 5 | 90 days supply | Qty: 90 | Fill #0

## 2015-09-30 ENCOUNTER — Telehealth (HOSPITAL_COMMUNITY): Payer: Self-pay | Admitting: *Deleted

## 2015-09-30 NOTE — Telephone Encounter (Signed)
Left message on voicemail per DPR in reference to upcoming appointment scheduled on 10/05/15 with detailed instructions given per Myocardial Perfusion Study Information Sheet for the test. LM to arrive 15 minutes early, and that it is imperative to arrive on time for appointment to keep from having the test rescheduled. If you need to cancel or reschedule your appointment, please call the office within 24 hours of your appointment. Failure to do so may result in a cancellation of your appointment, and a $50 no show fee. Phone number given for call back for any questions. Ayahna Solazzo Jacqueline   

## 2015-10-04 ENCOUNTER — Telehealth: Payer: Self-pay | Admitting: Physician Assistant

## 2015-10-04 NOTE — Telephone Encounter (Signed)
Pt cancelled nuclear stress test scheduled on 7/3 and again on 10/05/2015 reason -- Patient (pt ca appt,did not want to reschedule at this time-sw

## 2015-10-05 ENCOUNTER — Encounter (HOSPITAL_COMMUNITY): Payer: 59

## 2015-10-06 MED FILL — VIIBRYD 40 MG TABLET: 40 | 30 days supply | Qty: 30 | Fill #1

## 2015-10-07 DIAGNOSIS — M109 Gout, unspecified: Secondary | ICD-10-CM | POA: Diagnosis not present

## 2015-10-07 DIAGNOSIS — I10 Essential (primary) hypertension: Secondary | ICD-10-CM | POA: Diagnosis not present

## 2015-10-07 DIAGNOSIS — Z125 Encounter for screening for malignant neoplasm of prostate: Secondary | ICD-10-CM | POA: Diagnosis not present

## 2015-10-15 DIAGNOSIS — K219 Gastro-esophageal reflux disease without esophagitis: Secondary | ICD-10-CM | POA: Diagnosis not present

## 2015-10-15 DIAGNOSIS — M109 Gout, unspecified: Secondary | ICD-10-CM | POA: Diagnosis not present

## 2015-10-15 DIAGNOSIS — I10 Essential (primary) hypertension: Secondary | ICD-10-CM | POA: Diagnosis not present

## 2015-10-15 DIAGNOSIS — I2510 Atherosclerotic heart disease of native coronary artery without angina pectoris: Secondary | ICD-10-CM | POA: Diagnosis not present

## 2015-10-15 DIAGNOSIS — Z125 Encounter for screening for malignant neoplasm of prostate: Secondary | ICD-10-CM | POA: Diagnosis not present

## 2015-10-15 DIAGNOSIS — Z0000 Encounter for general adult medical examination without abnormal findings: Secondary | ICD-10-CM | POA: Diagnosis not present

## 2015-10-15 DIAGNOSIS — K5190 Ulcerative colitis, unspecified, without complications: Secondary | ICD-10-CM | POA: Diagnosis not present

## 2015-10-15 DIAGNOSIS — F411 Generalized anxiety disorder: Secondary | ICD-10-CM | POA: Diagnosis not present

## 2015-10-15 DIAGNOSIS — E7800 Pure hypercholesterolemia, unspecified: Secondary | ICD-10-CM | POA: Diagnosis not present

## 2015-10-19 ENCOUNTER — Encounter: Payer: Self-pay | Admitting: Nurse Practitioner

## 2015-10-19 ENCOUNTER — Ambulatory Visit (INDEPENDENT_AMBULATORY_CARE_PROVIDER_SITE_OTHER): Payer: 59 | Admitting: Nurse Practitioner

## 2015-10-19 VITALS — BP 133/83 | HR 57 | Ht 71.0 in | Wt 209.4 lb

## 2015-10-19 DIAGNOSIS — R002 Palpitations: Secondary | ICD-10-CM | POA: Diagnosis not present

## 2015-10-19 DIAGNOSIS — E785 Hyperlipidemia, unspecified: Secondary | ICD-10-CM | POA: Diagnosis not present

## 2015-10-19 DIAGNOSIS — I25119 Atherosclerotic heart disease of native coronary artery with unspecified angina pectoris: Secondary | ICD-10-CM | POA: Diagnosis not present

## 2015-10-19 DIAGNOSIS — I10 Essential (primary) hypertension: Secondary | ICD-10-CM | POA: Diagnosis not present

## 2015-10-19 DIAGNOSIS — I951 Orthostatic hypotension: Secondary | ICD-10-CM

## 2015-10-19 MED ORDER — RAMIPRIL 2.5 MG PO CAPS: 2.5000 mg | ORAL_CAPSULE | Freq: Every day | ORAL | 11 refills | Status: DC

## 2015-10-19 MED FILL — RAMIPRIL 2.5 MG CAPSULE: 2.5 | 90 days supply | Qty: 90 | Fill #0

## 2015-10-19 NOTE — Progress Notes (Signed)
Office Visit    Patient Name: Daniel Buck Date of Encounter: 10/19/2015  Primary Care Provider:  [name], MD Primary Cardiologist:  Roni Bread, MD   Chief Complaint    50 y/o ? with a h/o h/o CAD and MI, who presents for f/u.  Past Medical History    Past Medical History:  Diagnosis Date  . CAD S/P percutaneous coronary angioplasty 1996, 2002   a. s/p Inf MI (age 79) >>PCI to RCA with tandem BMS (PS 1535); LHC 06/2000: pRCA 30-40%, prox/mid stents with 75% ISR, 30-40% at crux in RCA >> PCI: Cutting Balloon atherectomy for ISR  . Carpal tunnel syndrome on left 12/2011  . Chronic nasal congestion   . Depression 01/19/2012  . GERD (gastroesophageal reflux disease)   . Gout   . History of Doppler ultrasound    a. Carotid US 3/11: no ICA stenosis   . History of echocardiogram    a. Echo 6/14: mod LVH, EF 50-55%, inf-lat HK, Gr 1 DD, mild LAE  . Hyperlipidemia LDL goal < 70   . Hypertension    under control; has been on med. since 1996  . Myocardial infarct, old 1996   PCI  . Ulcerative colitis [facility])    Past Surgical History:  Procedure Laterality Date  . CARPAL TUNNEL RELEASE  01/26/2012   Procedure: CARPAL TUNNEL RELEASE;  Surgeon: Cammie Sickle., MD;  Location: [facility];  Service: Orthopedics;  Laterality: Left;  . CORONARY ANGIOPLASTY  07/11/2000   Cutting Balloon PTCA for RCA ISR  . CORONARY STENT PLACEMENT  1996   [id number] BMS x 2 in RCA  . CYSTECTOMY  as a child   from neck  . NM MYOVIEW LTD  December 2012   Inferior infarct but no ischemia  . TONSILLECTOMY  as a child  . TRANSTHORACIC ECHOCARDIOGRAM  June 2014   APH: EF 50-50%, moderate concentric LVH. Mild HK of the inferolateral wall. Gr1 DD;     Allergies  No Known Allergies  History of Present Illness    50 year old male with prior history of CAD status post inferior myocardial infarction age 53, requiring overlapping bare metal stents to the right coronary artery. He also  has a history hypertension, hyperlipidemia and palpitations, controlled with beta blocker therapy. He underwent cutting balloon angioplasty 2002 secondary to in-stent restenosis in the right coronary artery. He had a nonischemic Myoview in 2012. He was last seen in clinic on June 22 with complaints of orthostatic lightheadedness and some degree of dyspnea on exertion. Over the past 2 months, he has been walking 2 miles daily, usually outside, sweating profusely, followed by weight lifting. He was scheduled for a Myoview but he subsequently canceled this as he felt as though his dyspnea was not significant and he has not been having any chest pain. He has continued to have orthostatic lightheadheadedness, most noticeable when standing up from a chair, or during squatting or dead lifting at the gym. He has never lost consciousness. He denies PND, orthopnea, syncope, edema, or early satiety.  Home Medications    Prior to Admission medications   Medication Sig Start Date End Date Taking? Authorizing Provider  acetaminophen (TYLENOL) 500 MG tablet Take 1,000 mg by mouth every 4 (four) hours as needed for mild pain or moderate pain.    Yes Historical Provider, MD  atenolol (TENORMIN) 25 MG tablet TAKE 12.5 mg TABLET BY MOUTH DAILY. 07/29/15  Yes [name], MD  clopidogrel (  PLAVIX) 75 MG tablet Take 1 tablet (75 mg total) by mouth daily. 07/29/15  Yes [name], MD  docusate sodium (COLACE) 100 MG capsule Take 100 mg by mouth daily.   Yes Historical Provider, MD  esomeprazole (NEXIUM) 40 MG capsule Take 40 mg by mouth 2 (two) times daily.    Yes Historical Provider, MD  mesalamine (LIALDA) 1.2 g EC tablet Take 1.2 g by mouth 2 (two) times daily.   Yes Historical Provider, MD  Multiple Vitamins-Minerals (MULTIVITAMIN WITH MINERALS) tablet Take 1 tablet by mouth daily.     Yes Historical Provider, MD  ramipril (ALTACE) 2.5 MG capsule Take 1 capsule (2.5 mg total) by mouth daily. 10/19/15  Yes Rogelia Mire, NP  simvastatin (ZOCOR) 40 MG tablet Take 1 tablet (40 mg total) by mouth at bedtime. 07/29/15  Yes [name], MD  Vilazodone HCl (VIIBRYD) 40 MG TABS Take 40 mg by mouth daily.   Yes Historical Provider, MD    Review of Systems    Orthostatic lightheadedness as above.  All other systems reviewed and are otherwise negative except as noted above.  Physical Exam    VS:  BP 133/83   Pulse (!) 57   Ht 5\' 11"  (1.803 m)   Wt 209 lb 6.4 oz (95 kg)   BMI 29.21 kg/m  , BMI Body mass index is 29.21 kg/m. GEN: Well nourished, well developed, in no acute distress.  HEENT: normal.  Neck: Supple, no JVD, carotid bruits, or masses. Cardiac: RRR, no murmurs, rubs, or gallops. No clubbing, cyanosis, edema.  Radials/DP/PT 2+ and equal bilaterally.  Respiratory:  Respirations regular and unlabored, clear to auscultation bilaterally. GI: Soft, nontender, nondistended, BS + x 4. MS: no deformity or atrophy. Skin: warm and dry, no rash. Neuro:  Strength and sensation are intact. Psych: Normal affect.  Accessory Clinical Findings    Outside labs reviewed. Total cholesterol 1:30. LDL 80.  Assessment & Plan    1.  Coronary artery disease: Patient has a history of inferior MI at the age of 66 with bare metal stenting of right coronary artery and subsequent cutting balloon angioplasty for in-stent restenosis in 2002. Last Myoview was nonischemic in 2012. He was recently seen with complaints of dyspnea and was scheduled for Myoview. Since then, he has not been noticing significant dyspnea and he canceled his Myoview. He is not interested in rescheduling at this time. He walks 2 miles daily without limitation. He remains on beta blocker Plavix and statin therapy.  2. Essential hypertension/orthostasis: Patient has been having some orthostatic lightheadedness. He's lost 18 pounds in the past 2 months and he has also been exercising quite a bit and likely not appropriately hydrating. I will reduce  his ramipril dose to 2.5 mg daily and have advised that he better hydrate following his walks. He is walking 2 miles at about a 3-1/2 mile per hour pace and says he's fairly sweaty when he comes in. I advised that he probably needs to drink about a liter and a half of fluid in order to account for fluid loss during his walk. He will watch his blood pressure and symptoms at home and contact us within a week if he continues to feel lightheaded. If so, I suspect we will have to discontinue her Avapro altogether. He is also on a low dose of atenolol, however he has been using this for history of palpitations as well.  3. Hyperlipidemia: Recent lipid profile by primary care  revealed a total cholesterol of 130, with an LDL of 80. He would like to take a statin holiday to see if his statin is potentially contributing to memory loss. I advised that he is welcome to do so, however we would still like him to be on some form of statin if possible. If memory improves off of simvastatin, I recommended that he call us in the next 2 weeks that we can prescribe an alternate as his lipids are not technically at goal and given his aggressive disease at a young age, we would like to be as aggressive as possible in preventing future issues.  4. Palpitations: Stable on low-dose beta blocker. Next  5. Disposition: Follow-up with Dr. Ellyn Hack in 3 months. Patient will contact us within the next 1-2 weeks if orthostatic symptoms persist, blood pressure rises on a lower dose of ramipril, or if memory deficits improve off of statin, at which point we may consider switching him to Lipitor.   Murray Hodgkins, NP 10/19/2015, 8:34 AM

## 2015-10-19 NOTE — Patient Instructions (Signed)
Ignacia Bayley, NP, has recommended making the following medication changes: 1. DECREASE Ramipril to 2.5 mg daily  Gerald Stabs recommends that you schedule a follow-up appointment in 3 months with Dr Ellyn Hack.  If you need a refill on your cardiac medications before your next appointment, please call your pharmacy.

## 2015-10-25 MED FILL — SIMVASTATIN 40 MG TABLET: 40 | 90 days supply | Qty: 90 | Fill #0

## 2015-10-25 MED FILL — CLOPIDOGREL 75 MG TABLET: 75 | 90 days supply | Qty: 90 | Fill #0

## 2015-11-02 DIAGNOSIS — N179 Acute kidney failure, unspecified: Secondary | ICD-10-CM | POA: Diagnosis not present

## 2015-11-08 MED FILL — VIIBRYD 40 MG TABLET: 40 | 30 days supply | Qty: 30 | Fill #2

## 2015-11-22 ENCOUNTER — Other Ambulatory Visit: Payer: Self-pay | Admitting: Gastroenterology

## 2015-12-03 ENCOUNTER — Other Ambulatory Visit: Payer: Self-pay | Admitting: Gastroenterology

## 2015-12-10 MED FILL — GAVILYTE-N SOLUTION: 420 | 1 days supply | Qty: 4000 | Fill #0

## 2015-12-14 ENCOUNTER — Encounter (HOSPITAL_COMMUNITY): Admission: RE | Payer: Self-pay | Source: Ambulatory Visit

## 2015-12-14 ENCOUNTER — Ambulatory Visit (HOSPITAL_COMMUNITY): Admission: RE | Admit: 2015-12-14 | Payer: 59 | Source: Ambulatory Visit | Admitting: Gastroenterology

## 2015-12-14 SURGERY — EGD (ESOPHAGOGASTRODUODENOSCOPY)
Anesthesia: Monitor Anesthesia Care

## 2015-12-15 DIAGNOSIS — G5601 Carpal tunnel syndrome, right upper limb: Secondary | ICD-10-CM | POA: Insufficient documentation

## 2015-12-20 MED FILL — VIIBRYD 40 MG TABLET: 40 | 30 days supply | Qty: 30 | Fill #0

## 2015-12-24 ENCOUNTER — Ambulatory Visit
Admission: RE | Admit: 2015-12-24 | Discharge: 2015-12-24 | Disposition: A | Discharge status: Home / Self Care | Payer: 59 | Source: Ambulatory Visit | Attending: Family Medicine | Admitting: Family Medicine

## 2015-12-24 ENCOUNTER — Other Ambulatory Visit: Payer: Self-pay | Admitting: Family Medicine

## 2015-12-24 DIAGNOSIS — M546 Pain in thoracic spine: Secondary | ICD-10-CM | POA: Diagnosis not present

## 2015-12-24 DIAGNOSIS — M549 Dorsalgia, unspecified: Secondary | ICD-10-CM | POA: Diagnosis not present

## 2015-12-24 DIAGNOSIS — M5489 Other dorsalgia: Secondary | ICD-10-CM

## 2015-12-27 ENCOUNTER — Other Ambulatory Visit (HOSPITAL_COMMUNITY): Payer: Self-pay | Admitting: Family Medicine

## 2015-12-27 DIAGNOSIS — R634 Abnormal weight loss: Secondary | ICD-10-CM

## 2015-12-27 DIAGNOSIS — M546 Pain in thoracic spine: Secondary | ICD-10-CM

## 2015-12-30 ENCOUNTER — Encounter (HOSPITAL_COMMUNITY): Payer: Self-pay

## 2015-12-30 ENCOUNTER — Encounter (HOSPITAL_COMMUNITY)
Admission: RE | Admit: 2015-12-30 | Discharge: 2015-12-30 | Disposition: A | Discharge status: Home / Self Care | Payer: 59 | Source: Ambulatory Visit | Attending: Family Medicine | Admitting: Family Medicine

## 2015-12-30 ENCOUNTER — Other Ambulatory Visit (HOSPITAL_COMMUNITY)
Admission: RE | Admit: 2015-12-30 | Discharge: 2015-12-30 | Disposition: A | Discharge status: Home / Self Care | Payer: 59 | Source: Ambulatory Visit | Attending: Family Medicine | Admitting: Family Medicine

## 2015-12-30 DIAGNOSIS — R634 Abnormal weight loss: Secondary | ICD-10-CM | POA: Insufficient documentation

## 2015-12-30 DIAGNOSIS — M546 Pain in thoracic spine: Secondary | ICD-10-CM

## 2015-12-30 DIAGNOSIS — M549 Dorsalgia, unspecified: Secondary | ICD-10-CM | POA: Insufficient documentation

## 2015-12-30 LAB — CBC WITH DIFFERENTIAL/PLATELET
BASOS PCT: 1 %
Basophils Absolute: 0.1 10*3/uL (ref 0.0–0.1)
EOS ABS: 0.5 10*3/uL (ref 0.0–0.7)
Eosinophils Relative: 6 %
HCT: 46.5 % (ref 39.0–52.0)
HEMOGLOBIN: 16 g/dL (ref 13.0–17.0)
Lymphocytes Relative: 32 %
Lymphs Abs: 2.4 10*3/uL (ref 0.7–4.0)
MCH: 30.8 pg (ref 26.0–34.0)
MCHC: 34.4 g/dL (ref 30.0–36.0)
MCV: 89.4 fL (ref 78.0–100.0)
Monocytes Absolute: 0.7 10*3/uL (ref 0.1–1.0)
Monocytes Relative: 9 %
NEUTROS PCT: 52 %
Neutro Abs: 4 10*3/uL (ref 1.7–7.7)
Platelets: 204 10*3/uL (ref 150–400)
RBC: 5.2 MIL/uL (ref 4.22–5.81)
RDW: 12.9 % (ref 11.5–15.5)
WBC: 7.6 10*3/uL (ref 4.0–10.5)

## 2015-12-30 LAB — COMPREHENSIVE METABOLIC PANEL
ALBUMIN: 4.8 g/dL (ref 3.5–5.0)
ALK PHOS: 71 U/L (ref 38–126)
ALT: 25 U/L (ref 17–63)
ANION GAP: 6 (ref 5–15)
AST: 31 U/L (ref 15–41)
BILIRUBIN TOTAL: 0.9 mg/dL (ref 0.3–1.2)
BUN: 19 mg/dL (ref 6–20)
CALCIUM: 9.9 mg/dL (ref 8.9–10.3)
CO2: 29 mmol/L (ref 22–32)
Chloride: 102 mmol/L (ref 101–111)
Creatinine, Ser: 1.29 mg/dL — ABNORMAL HIGH (ref 0.61–1.24)
GFR calc Af Amer: >60 mL/min (ref >60)
GLUCOSE: 88 mg/dL (ref 65–99)
Potassium: 4 mmol/L (ref 3.5–5.1)
Sodium: 137 mmol/L (ref 135–145)
TOTAL PROTEIN: 7.8 g/dL (ref 6.5–8.1)

## 2015-12-30 LAB — TSH: TSH: 1.627 u[IU]/mL (ref 0.350–4.500)

## 2015-12-30 MED ORDER — TECHNETIUM TC 99M MEDRONATE IV KIT
25.0000 | PACK | Freq: Once | INTRAVENOUS | Status: AC | PRN
  Administered 2015-12-30: 20.4 via INTRAVENOUS

## 2016-01-05 DIAGNOSIS — M9902 Segmental and somatic dysfunction of thoracic region: Secondary | ICD-10-CM | POA: Diagnosis not present

## 2016-01-05 DIAGNOSIS — M9903 Segmental and somatic dysfunction of lumbar region: Secondary | ICD-10-CM | POA: Diagnosis not present

## 2016-01-05 DIAGNOSIS — M546 Pain in thoracic spine: Secondary | ICD-10-CM | POA: Diagnosis not present

## 2016-01-05 DIAGNOSIS — M545 Low back pain: Secondary | ICD-10-CM | POA: Diagnosis not present

## 2016-01-07 ENCOUNTER — Encounter (HOSPITAL_COMMUNITY): Payer: 59

## 2016-01-10 DIAGNOSIS — Z23 Encounter for immunization: Secondary | ICD-10-CM | POA: Diagnosis not present

## 2016-01-10 DIAGNOSIS — D1800 Hemangioma unspecified site: Secondary | ICD-10-CM | POA: Diagnosis not present

## 2016-01-10 DIAGNOSIS — M549 Dorsalgia, unspecified: Secondary | ICD-10-CM | POA: Diagnosis not present

## 2016-01-19 ENCOUNTER — Encounter: Payer: Self-pay | Admitting: Cardiology

## 2016-01-19 ENCOUNTER — Ambulatory Visit (INDEPENDENT_AMBULATORY_CARE_PROVIDER_SITE_OTHER): Payer: 59 | Admitting: Cardiology

## 2016-01-19 VITALS — BP 137/87 | HR 73 | Ht 71.0 in | Wt 189.6 lb

## 2016-01-19 DIAGNOSIS — I252 Old myocardial infarction: Secondary | ICD-10-CM

## 2016-01-19 DIAGNOSIS — I10 Essential (primary) hypertension: Secondary | ICD-10-CM

## 2016-01-19 DIAGNOSIS — Z9861 Coronary angioplasty status: Secondary | ICD-10-CM

## 2016-01-19 DIAGNOSIS — E785 Hyperlipidemia, unspecified: Secondary | ICD-10-CM

## 2016-01-19 DIAGNOSIS — R002 Palpitations: Secondary | ICD-10-CM | POA: Diagnosis not present

## 2016-01-19 DIAGNOSIS — R413 Other amnesia: Secondary | ICD-10-CM | POA: Insufficient documentation

## 2016-01-19 DIAGNOSIS — R42 Dizziness and giddiness: Secondary | ICD-10-CM

## 2016-01-19 DIAGNOSIS — I2510 Atherosclerotic heart disease of native coronary artery without angina pectoris: Secondary | ICD-10-CM | POA: Diagnosis not present

## 2016-01-19 MED ORDER — ATENOLOL 25 MG PO TABS: 12.5000 mg | ORAL_TABLET | ORAL | 4 refills | Status: DC | PRN

## 2016-01-19 MED FILL — VIIBRYD 40 MG TABLET: 40 | 30 days supply | Qty: 30 | Fill #1

## 2016-01-19 NOTE — Progress Notes (Signed)
PCP: [name], MD  Clinic Note: Chief Complaint  Patient presents with  . Coronary Artery Disease    History of MI-PCI at age 50  . Dizziness    Orthostatic hypertension    HPI: Daniel Buck is a 50 y.o. male with a PMH below who presents today for 3 month f/u for (premature) MI-CAD-PCI.[name] History of inferior MI at age 84 - PCI RCA with overlap BMS - then had PTCA in 2002 for ISR. -- Negative Myoview in 2012  Also has hypertension, hyperlipidemia and palpitations are controlled with beta blocker.  Daniel Buck was last seen on July 25,2017 by Mr. Daniel Douglas, NP.  Before that he was seen by Daniel Dopp, PA in June for orthostatic dizziness. He was scheduled for Myoview, but canceled it as he felt his exertional dyspnea was nonsignificant. However he continued to have some orthostatic lightheadedness. He was walking 2 miles a day and doing routine weightlifting. Usually his dizziness is associated with squatting or standing up from a chair. He noted having lost 18 pounds in the past 2 months vigorous exertional exercise. He was instructed to ensure the adequate hydrates. The follow was remaining to discontinue Altace.  Recent Hospitalizations: None  Studies Reviewed: None since 2014  Interval History: He presents today doing very well with no major complaints. He actually stopped taking his Avapro shortly after seeing Mr. Daniel Buck, and then about 2 weeks ago because continue be dizzy stop the atenolol since doing that, he really has not had any further dizziness. He has had a little bit of occasional palpitations at nighttime since stopping atenolol but nothing significant. Still has some memory issues - did not stop simvastatin.  Otherwise cardiovascular review of symptoms as follows: No chest pain or shortness of breath with rest or exertion.  No PND, orthopnea or edema.  No more lightheadedness, dizziness, weakness or  No syncope/near syncope, or TIA/amaurosis fugax symptoms. No  claudication.  ROS: A comprehensive was performed. Review of Systems  Constitutional: Negative for chills, fever, malaise/fatigue and weight loss.  HENT: Negative for congestion and nosebleeds.   Respiratory: Negative for cough, shortness of breath and wheezing.   Cardiovascular: Negative.        Per history of present illness  Gastrointestinal: Negative for blood in stool and melena.  Genitourinary: Negative for hematuria.  Musculoskeletal: Negative for joint pain.  Neurological: Negative for dizziness (No longer noticing the dizziness or lightheadedness since stopping both of his blood pressure medications.) and headaches.  Endo/Heme/Allergies: Negative for environmental allergies.  Psychiatric/Behavioral: Positive for memory loss. Negative for depression. The patient is not nervous/anxious and does not have insomnia.     Past Medical History:  Diagnosis Date  . CAD S/P percutaneous coronary angioplasty 1996, 2002   a. s/p Inf MI (age 73) >>PCI to RCA with tandem BMS (PS 1535); b. LHC 06/2000: pRCA 30-40%, prox/mid stents with 75% ISR, 30-40% at crux in RCA >> PCI: Cutting Balloon atherectomy for ISR; c. 2012 MV: inf ischemia, no scar.  . Carpal tunnel syndrome on left 12/2011  . Chronic nasal congestion   . Depression 01/19/2012  . GERD (gastroesophageal reflux disease)   . Gout   . History of Doppler ultrasound    a. Carotid US 3/11: no ICA stenosis   . History of echocardiogram    a. Echo 6/14: mod LVH, EF 50-55%, inf-lat HK, Gr 1 DD, mild LAE  . Hyperlipidemia LDL goal < 70   . Hypertension    under  control; has been on med. since 1996  . Myocardial infarct, old 1996   PCI  . Ulcerative colitis [facility])     Past Surgical History:  Procedure Laterality Date  . CARPAL TUNNEL RELEASE  01/26/2012   Procedure: CARPAL TUNNEL RELEASE;  Surgeon: Cammie Sickle., MD;  Location: [facility];  Service: Orthopedics;  Laterality: Left;  . CORONARY ANGIOPLASTY   07/11/2000   Cutting Balloon PTCA for RCA ISR  . CORONARY STENT PLACEMENT  1996   [id number] BMS x 2 in RCA  . CYSTECTOMY  as a child   from neck  . NM MYOVIEW LTD  December 2012   Inferior infarct but no ischemia  . TONSILLECTOMY  as a child  . TRANSTHORACIC ECHOCARDIOGRAM  June 2014   APH: EF 50-50%, moderate concentric LVH. Mild HK of the inferolateral wall. Gr1 DD;     Prior to Admission medications   Medication Sig Start Date End Date Taking? Authorizing Provider  acetaminophen (TYLENOL) 500 MG tablet Take 1,000 mg by mouth every 4 (four) hours as needed for mild pain or moderate pain.    Yes Historical Provider, MD  clopidogrel (PLAVIX) 75 MG tablet Take 1 tablet (75 mg total) by mouth daily. 07/29/15  Yes [name], MD  docusate sodium (COLACE) 100 MG capsule Take 100 mg by mouth daily.   Yes Historical Provider, MD  esomeprazole (NEXIUM) 40 MG capsule Take 40 mg by mouth 2 (two) times daily.    Yes Historical Provider, MD  Multiple Vitamins-Minerals (MULTIVITAMIN WITH MINERALS) tablet Take 1 tablet by mouth daily.     Yes Historical Provider, MD  simvastatin (ZOCOR) 40 MG tablet Take 1 tablet (40 mg total) by mouth at bedtime. 07/29/15  Yes [name], MD  Vilazodone HCl (VIIBRYD) 40 MG TABS Take 40 mg by mouth daily.   Yes Historical Provider, MD  atenolol (TENORMIN) 25 MG tablet Take 0.5 tablets (12.5 mg total) by mouth as needed. 01/19/16  No  [name], MD   No Known Allergies   Social History   Social History  . Marital status: Married    Spouse name: N/A  . Number of children: N/A  . Years of education: N/A   Social History Main Topics  . Smoking status: Former Smoker    Quit date: 03/27/1992  . Smokeless tobacco: Current User    Types: Snuff, Chew  . Alcohol use No  . Drug use: No  . Sexual activity: Not Asked   Other Topics Concern  . None   Social History Narrative   He lives in [place], [place] near [place] with his wife. [place]  [place]. He is extremely active around the farm, but does not have a routine exercise regimen.   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   He quit smoking in 1994.   family history includes AAA (abdominal aortic aneurysm) in his father; Aortic stenosis in his mother; Atrial fibrillation in his father; CAD in his father.   Wt Readings from Last 3 Encounters:  01/19/16 86 kg (189 lb 9.6 oz)  10/19/15 95 kg (209 lb 6.4 oz)  09/16/15 100.2 kg (220 lb 12.8 oz)    PHYSICAL EXAM BP 137/87   Pulse 73   Ht 5' 11"  (1.803 m)   Wt 86 kg (189 lb 9.6 oz)   SpO2 99%   BMI 26.44 kg/m  General appearance: alert, cooperative, appears stated age, no distress  and Well-nourished, well-groomed. Healthy-appearing Neck: no adenopathy, no carotid bruit and no JVD Lungs: clear to auscultation bilaterally, normal percussion bilaterally and non-labored Heart: regular rate and rhythm, S1 and S2 normal, no murmur, click, rub or gallop; nondisplaced PMI Abdomen: soft, non-tender; bowel sounds normal; no masses,  no organomegaly; no HJR Extremities: extremities normal, atraumatic, no cyanosis, and or edema  Pulses: 2+ and symmetric;  Skin: mobility and turgor normal, no edema and no evidence of bleeding or bruising  Neurologic: Mental status: Alert, oriented, thought content appropriate   Adult ECG Report  Rate: 73 ;  Rhythm: normal sinus rhythm and Left axis deviation. Otherwise stable EKG. Normal intervals and durations;   Narrative Interpretation: Stable EKG   Other studies Reviewed: Additional studies/ records that were reviewed today include:  Recent Labs:   From PCP this summer: Total cholesterol 130 and LDL of 80. => Did not take a statin holiday for possible memory loss. Plan would be if necessary restart a different statin besides statin.  ASSESSMENT / PLAN: Problem List Items Addressed This Visit    Orthostatic dizziness (Chronic)    Notably improved since he actually stopped both the beta blocker  and ACE inhibitor. He also was ensuring that he stays hydrated.      Myocardial infarct, old (Chronic)   Relevant Medications   atenolol (TENORMIN) 25 MG tablet   Memory loss    At this point though he is still having some memory issues, so we will actually have him take a statin holiday for about 6 weeks. Based on how he does after that, we could probably consider restarting with something like pravastatin or Crestor. He will let us know about 6 weeks. No change in memory issues he will restart.      Relevant Orders   EKG 12-Lead   Hyperlipidemia LDL goal <70 (Chronic)    Last check in the summer by his PCP showed pretty good numbers on simvastatin.  We'll need to reassess after taking a statin holiday and potentially switch to a different statin      Relevant Medications   atenolol (TENORMIN) 25 MG tablet   Other Relevant Orders   EKG 12-Lead   Heart palpitations    Pretty well-controlled. I still think he can take the when necessary atenolol since he has some at home. He says that he really hasn't had enough to think about taking it. In the future, if we do need to reassess start something for blood pressure I would probably opt for low-dose metoprolol which is less potent for hypertension.      Relevant Orders   EKG 12-Lead   Essential hypertension, benign (Chronic)    Was doing relatively well, but started having orthostatic symptoms over the summer. Probably related to some dehydration issues. He is now off of both the ACE inhibitor and beta blocker. Will need to monitor to see if his pressures to back up. They do come would probably restart the beta blocker first since he does have some palpitations.      Relevant Medications   atenolol (TENORMIN) 25 MG tablet   Other Relevant Orders   EKG 12-Lead   CAD S/P percutaneous coronary angioplasty - Primary (Chronic)   Relevant Medications   atenolol (TENORMIN) 25 MG tablet   Other Relevant Orders   EKG 12-Lead    Other Visit  Diagnoses   None.     Current medicines are reviewed at length with the patient today. (+/- concerns) Still having memory  issues The following changes have been made: See below  Patient Instructions  MAY HOLD SIMVASTATIN FOR 6 WEEKS TO SEE IF MEMORY ISSUE GET BETTER-- CALL OFFICE TO LET us KNOW IF BETTER WILL SWITCH MEDICATION TO 40 MG PRAVASTATIN.  USE ATENOLOL AS NEEDED, IF PALPITATION RETURN - USE FOR ABOUT 2-3 DAYS THEN STOP.   Your physician recommends that you schedule a follow-up appointment in: [place]  Your physician wants you to follow-up in: [place].  You will receive a reminder letter in the mail two months in advance. If you don't receive a letter, please call our office to schedule the follow-up appointment.  If you need a refill on your cardiac medications before your next appointment, please call your pharmacy.    Studies Ordered:   Orders Placed This Encounter  Procedures  . EKG 12-Lead      Glenetta Hew, M.D., M.S. Interventional Cardiologist   Pager # [phone] Phone # [phone] [address] [place], [place] [address]

## 2016-01-19 NOTE — Patient Instructions (Addendum)
MAY HOLD SIMVASTATIN FOR 6 WEEKS TO SEE IF MEMORY ISSUE GET BETTER-- CALL OFFICE TO LET us KNOW IF BETTER WILL SWITCH MEDICATION TO 40 MG PRAVASTATIN.  USE ATENOLOL AS NEEDED, IF PALPITATION RETURN - USE FOR ABOUT 2-3 DAYS THEN STOP.   Your physician recommends that you schedule a follow-up appointment in: [place]  Your physician wants you to follow-up in: [place].  You will receive a reminder letter in the mail two months in advance. If you don't receive a letter, please call our office to schedule the follow-up appointment.  If you need a refill on your cardiac medications before your next appointment, please call your pharmacy.

## 2016-01-20 ENCOUNTER — Encounter: Payer: Self-pay | Admitting: Cardiology

## 2016-01-20 DIAGNOSIS — R42 Dizziness and giddiness: Secondary | ICD-10-CM | POA: Insufficient documentation

## 2016-01-20 NOTE — Assessment & Plan Note (Signed)
Was doing relatively well, but started having orthostatic symptoms over the summer. Probably related to some dehydration issues. He is now off of both the ACE inhibitor and beta blocker. Will need to monitor to see if his pressures to back up. They do come would probably restart the beta blocker first since he does have some palpitations.

## 2016-01-20 NOTE — Assessment & Plan Note (Signed)
Notably improved since he actually stopped both the beta blocker and ACE inhibitor. He also was ensuring that he stays hydrated.

## 2016-01-20 NOTE — Assessment & Plan Note (Signed)
Pretty well-controlled. I still think he can take the when necessary atenolol since he has some at home. He says that he really hasn't had enough to think about taking it. In the future, if we do need to reassess start something for blood pressure I would probably opt for low-dose metoprolol which is less potent for hypertension.

## 2016-01-20 NOTE — Assessment & Plan Note (Signed)
Last check in the summer by his PCP showed pretty good numbers on simvastatin.  We'll need to reassess after taking a statin holiday and potentially switch to a different statin

## 2016-01-20 NOTE — Assessment & Plan Note (Signed)
At this point though he is still having some memory issues, so we will actually have him take a statin holiday for about 6 weeks. Based on how he does after that, we could probably consider restarting with something like pravastatin or Crestor. He will let us know about 6 weeks. No change in memory issues he will restart.

## 2016-01-26 MED FILL — CLOPIDOGREL 75 MG TABLET: 75 | 90 days supply | Qty: 90 | Fill #1

## 2016-02-09 DIAGNOSIS — G5601 Carpal tunnel syndrome, right upper limb: Secondary | ICD-10-CM | POA: Diagnosis not present

## 2016-02-25 MED FILL — VIIBRYD 40 MG TABLET: 40 | 30 days supply | Qty: 30 | Fill #2

## 2016-03-28 MED FILL — ESOMEPRAZOLE MAG DR 40 MG C: 40 | 90 days supply | Qty: 180 | Fill #1

## 2016-03-29 MED FILL — VIIBRYD 40 MG TABLET: 40 | 30 days supply | Qty: 30 | Fill #3

## 2016-04-13 MED FILL — CLOPIDOGREL 75 MG TABLET: 75 | 90 days supply | Qty: 90 | Fill #2

## 2016-04-17 DIAGNOSIS — G5601 Carpal tunnel syndrome, right upper limb: Secondary | ICD-10-CM | POA: Diagnosis not present

## 2016-04-17 DIAGNOSIS — G5621 Lesion of ulnar nerve, right upper limb: Secondary | ICD-10-CM | POA: Diagnosis not present

## 2016-05-04 MED FILL — VIIBRYD 40 MG TABLET: 40 | 30 days supply | Qty: 30 | Fill #4

## 2016-05-22 ENCOUNTER — Ambulatory Visit (INDEPENDENT_AMBULATORY_CARE_PROVIDER_SITE_OTHER): Payer: 59 | Admitting: Family Medicine

## 2016-05-22 ENCOUNTER — Encounter: Payer: Self-pay | Admitting: Family Medicine

## 2016-05-22 VITALS — BP 126/88 | HR 87 | Wt 198.0 lb

## 2016-05-22 DIAGNOSIS — M7522 Bicipital tendinitis, left shoulder: Secondary | ICD-10-CM

## 2016-05-22 NOTE — Progress Notes (Signed)
   Subjective:    Patient ID: Daniel Buck, male    DOB: 1965/10/03, 51 y.o.   MRN: [id number]  HPI He has been lifting weights for the last 9 months in 3 months ago he started noticing more anterior shoulder pain. Approximately 5 weeks ago while doing triceps exercises with his arm extended above his head in and external rotated and abducted position, apparently the weights slipped and he noted some anterior shoulder pain. Initially it was quite painful but now it only bothers him when he is lifting weights. He can do his ADLs.   Review of Systems     Objective:   Physical Exam Alert and in no distress. Full motion of the shoulder. Tender to palpation over the bicipital groove. Neer's and Hawkins test negative. Negative sulcus sign.       Assessment & Plan:  Bicipital tendinitis of left shoulder Recommended heat for 20 minutes 3 times per day and Aleve. Also avoid any activity that makes it worse. He continues have difficulty after couple weeks, I will then injected around the bicipital groove to see if this will help. He was comfortable with that.

## 2016-05-30 MED FILL — VIIBRYD 40 MG TABLET: 40 | 30 days supply | Qty: 30 | Fill #5

## 2016-06-14 ENCOUNTER — Other Ambulatory Visit (HOSPITAL_COMMUNITY)
Admission: RE | Admit: 2016-06-14 | Discharge: 2016-06-14 | Disposition: A | Discharge status: Home / Self Care | Payer: 59 | Source: Ambulatory Visit | Attending: Family Medicine | Admitting: Family Medicine

## 2016-06-14 DIAGNOSIS — N179 Acute kidney failure, unspecified: Secondary | ICD-10-CM | POA: Diagnosis not present

## 2016-06-14 DIAGNOSIS — R5383 Other fatigue: Secondary | ICD-10-CM | POA: Insufficient documentation

## 2016-06-14 DIAGNOSIS — I10 Essential (primary) hypertension: Secondary | ICD-10-CM | POA: Diagnosis not present

## 2016-06-14 DIAGNOSIS — I2510 Atherosclerotic heart disease of native coronary artery without angina pectoris: Secondary | ICD-10-CM | POA: Diagnosis not present

## 2016-06-14 DIAGNOSIS — E7800 Pure hypercholesterolemia, unspecified: Secondary | ICD-10-CM | POA: Diagnosis not present

## 2016-06-14 DIAGNOSIS — I119 Hypertensive heart disease without heart failure: Secondary | ICD-10-CM | POA: Diagnosis not present

## 2016-06-14 DIAGNOSIS — M109 Gout, unspecified: Secondary | ICD-10-CM | POA: Insufficient documentation

## 2016-06-14 DIAGNOSIS — K5190 Ulcerative colitis, unspecified, without complications: Secondary | ICD-10-CM | POA: Diagnosis not present

## 2016-06-14 DIAGNOSIS — F411 Generalized anxiety disorder: Secondary | ICD-10-CM | POA: Diagnosis not present

## 2016-06-14 LAB — CBC WITH DIFFERENTIAL/PLATELET
BASOS PCT: 1 %
Basophils Absolute: 0.1 10*3/uL (ref 0.0–0.1)
EOS ABS: 0.5 10*3/uL (ref 0.0–0.7)
Eosinophils Relative: 7 %
HCT: 47.2 % (ref 39.0–52.0)
Hemoglobin: 16.2 g/dL (ref 13.0–17.0)
LYMPHS ABS: 1.6 10*3/uL (ref 0.7–4.0)
Lymphocytes Relative: 23 %
MCH: 30.2 pg (ref 26.0–34.0)
MCHC: 34.3 g/dL (ref 30.0–36.0)
MCV: 88.1 fL (ref 78.0–100.0)
MONOS PCT: 8 %
Monocytes Absolute: 0.6 10*3/uL (ref 0.1–1.0)
NEUTROS PCT: 61 %
Neutro Abs: 4.3 10*3/uL (ref 1.7–7.7)
Platelets: 217 10*3/uL (ref 150–400)
RBC: 5.36 MIL/uL (ref 4.22–5.81)
RDW: 12.6 % (ref 11.5–15.5)
WBC: 7 10*3/uL (ref 4.0–10.5)

## 2016-06-14 LAB — COMPREHENSIVE METABOLIC PANEL
ALBUMIN: 4.4 g/dL (ref 3.5–5.0)
ALT: 23 U/L (ref 17–63)
ANION GAP: 8 (ref 5–15)
AST: 39 U/L (ref 15–41)
Alkaline Phosphatase: 79 U/L (ref 38–126)
BUN: 21 mg/dL — ABNORMAL HIGH (ref 6–20)
CALCIUM: 9.6 mg/dL (ref 8.9–10.3)
CHLORIDE: 100 mmol/L — AB (ref 101–111)
CO2: 28 mmol/L (ref 22–32)
Creatinine, Ser: 1.57 mg/dL — ABNORMAL HIGH (ref 0.61–1.24)
GFR calc non Af Amer: 50 mL/min — ABNORMAL LOW (ref >60)
GFR, EST AFRICAN AMERICAN: 58 mL/min — AB (ref >60)
GLUCOSE: 101 mg/dL — AB (ref 65–99)
POTASSIUM: 4 mmol/L (ref 3.5–5.1)
SODIUM: 136 mmol/L (ref 135–145)
Total Bilirubin: 0.8 mg/dL (ref 0.3–1.2)
Total Protein: 7.5 g/dL (ref 6.5–8.1)

## 2016-06-14 LAB — LIPID PANEL
CHOL/HDL RATIO: 6.6 ratio
CHOLESTEROL: 218 mg/dL — AB (ref 0–200)
HDL: 33 mg/dL — AB (ref >40)
LDL Cholesterol: 160 mg/dL — ABNORMAL HIGH (ref 0–99)
Triglycerides: 123 mg/dL (ref <150)
VLDL: 25 mg/dL (ref 0–40)

## 2016-06-14 LAB — URIC ACID: URIC ACID, SERUM: 9.6 mg/dL — AB (ref 4.4–7.6)

## 2016-06-14 LAB — TSH: TSH: 2.652 u[IU]/mL (ref 0.350–4.500)

## 2016-06-15 LAB — TESTOSTERONE: Testosterone: 606 ng/dL (ref 264–916)

## 2016-06-15 LAB — MICROALBUMIN, URINE: MICROALB UR: 50.1 ug/mL — AB

## 2016-06-16 MED FILL — SIMVASTATIN 40 MG TABLET: 40 | 90 days supply | Qty: 90 | Fill #1

## 2016-06-16 MED FILL — ESOMEPRAZOLE MAG DR 40 MG C: 40 | 90 days supply | Qty: 180 | Fill #2

## 2016-06-29 ENCOUNTER — Other Ambulatory Visit (HOSPITAL_COMMUNITY)
Admission: RE | Admit: 2016-06-29 | Discharge: 2016-06-29 | Disposition: A | Discharge status: Home / Self Care | Payer: 59 | Source: Ambulatory Visit | Attending: Family Medicine | Admitting: Family Medicine

## 2016-06-29 DIAGNOSIS — N179 Acute kidney failure, unspecified: Secondary | ICD-10-CM | POA: Insufficient documentation

## 2016-06-29 LAB — BASIC METABOLIC PANEL
Anion gap: 7 (ref 5–15)
BUN: 18 mg/dL (ref 6–20)
CALCIUM: 9.6 mg/dL (ref 8.9–10.3)
CO2: 29 mmol/L (ref 22–32)
Chloride: 102 mmol/L (ref 101–111)
Creatinine, Ser: 1.33 mg/dL — ABNORMAL HIGH (ref 0.61–1.24)
GFR calc Af Amer: >60 mL/min (ref >60)
GLUCOSE: 98 mg/dL (ref 65–99)
Potassium: 4.1 mmol/L (ref 3.5–5.1)
SODIUM: 138 mmol/L (ref 135–145)

## 2016-07-04 MED FILL — VIIBRYD 40 MG TABLET: 40 | 30 days supply | Qty: 30 | Fill #0

## 2016-07-21 MED FILL — LIALDA 1.2 GM TABLET SA: 1.2 | 30 days supply | Qty: 120 | Fill #1

## 2016-07-27 DIAGNOSIS — K5190 Ulcerative colitis, unspecified, without complications: Secondary | ICD-10-CM | POA: Diagnosis not present

## 2016-08-01 ENCOUNTER — Other Ambulatory Visit: Payer: Self-pay | Admitting: Cardiology

## 2016-08-01 MED FILL — VIIBRYD 40 MG TABLET: 40 | 30 days supply | Qty: 30 | Fill #1

## 2016-08-01 NOTE — Telephone Encounter (Signed)
Rx(s) sent to pharmacy electronically.  

## 2016-08-11 MED FILL — CLOPIDOGREL 75 MG TABLET: 75 | 90 days supply | Qty: 90 | Fill #0

## 2016-08-16 MED FILL — predniSONE 20 MG TABS: 20 | 30 days supply | Qty: 30 | Fill #0

## 2016-08-30 MED FILL — VIIBRYD 40 MG TABLET: 40 | 30 days supply | Qty: 30 | Fill #2

## 2016-09-01 ENCOUNTER — Telehealth: Payer: Self-pay | Admitting: Cardiology

## 2016-09-01 NOTE — Telephone Encounter (Signed)
S/w Crystal @[facility]  Physcian she will have someone call back-no one is at their desk

## 2016-09-01 NOTE — Telephone Encounter (Signed)
New message    Needs answer today   Request for surgical clearance:  1. What type of surgery is being performed? Colonoscopy    2. When is this surgery scheduled?  June 11th   3. Are there any medications that need to be held prior to surgery and how long? Plavix   4. Name of physician performing surgery?  Dr Watt Climes  5. What is your office phone and fax number?  [phone]- fax [phone]

## 2016-09-01 NOTE — Telephone Encounter (Signed)
Left message on home phone - instruction left to hold plavix  routed information to Dr Perley Jain OFFICE

## 2016-09-01 NOTE — Telephone Encounter (Signed)
OK for colonoscopy - would probably have needed to hold Plavix 5-7 days pre-procedure.  Glenetta Hew, MD

## 2016-09-04 DIAGNOSIS — K5289 Other specified noninfective gastroenteritis and colitis: Secondary | ICD-10-CM | POA: Diagnosis not present

## 2016-09-04 DIAGNOSIS — K5730 Diverticulosis of large intestine without perforation or abscess without bleeding: Secondary | ICD-10-CM | POA: Diagnosis not present

## 2016-09-04 DIAGNOSIS — K648 Other hemorrhoids: Secondary | ICD-10-CM | POA: Diagnosis not present

## 2016-09-04 DIAGNOSIS — K6389 Other specified diseases of intestine: Secondary | ICD-10-CM | POA: Diagnosis not present

## 2016-09-04 DIAGNOSIS — Z1211 Encounter for screening for malignant neoplasm of colon: Secondary | ICD-10-CM | POA: Diagnosis not present

## 2016-09-04 DIAGNOSIS — K5150 Left sided colitis without complications: Secondary | ICD-10-CM | POA: Diagnosis not present

## 2016-09-11 MED FILL — ESOMEPRAZOLE MAG DR 40 MG C: 40 | 90 days supply | Qty: 180 | Fill #0

## 2016-09-11 MED FILL — predniSONE 20 MG TABS: 20 | 30 days supply | Qty: 30 | Fill #1

## 2016-09-13 ENCOUNTER — Other Ambulatory Visit (HOSPITAL_COMMUNITY)
Admission: RE | Admit: 2016-09-13 | Discharge: 2016-09-13 | Disposition: A | Discharge status: Home / Self Care | Payer: 59 | Source: Ambulatory Visit | Attending: Family Medicine | Admitting: Family Medicine

## 2016-09-13 DIAGNOSIS — N179 Acute kidney failure, unspecified: Secondary | ICD-10-CM | POA: Diagnosis not present

## 2016-09-13 LAB — BASIC METABOLIC PANEL
ANION GAP: 10 (ref 5–15)
BUN: 21 mg/dL — ABNORMAL HIGH (ref 6–20)
CHLORIDE: 100 mmol/L — AB (ref 101–111)
CO2: 27 mmol/L (ref 22–32)
Calcium: 9.5 mg/dL (ref 8.9–10.3)
Creatinine, Ser: 1.36 mg/dL — ABNORMAL HIGH (ref 0.61–1.24)
GFR calc Af Amer: >60 mL/min (ref >60)
GFR, EST NON AFRICAN AMERICAN: 59 mL/min — AB (ref >60)
Glucose, Bld: 109 mg/dL — ABNORMAL HIGH (ref 65–99)
POTASSIUM: 3.8 mmol/L (ref 3.5–5.1)
Sodium: 137 mmol/L (ref 135–145)

## 2016-09-13 MED FILL — predniSONE 10 MG TABS: 10 | 30 days supply | Qty: 30 | Fill #0

## 2016-09-25 ENCOUNTER — Other Ambulatory Visit: Payer: Self-pay | Admitting: Cardiology

## 2016-09-25 MED FILL — SIMVASTATIN 40 MG TABLET: 40 | 90 days supply | Qty: 90 | Fill #0

## 2016-09-29 ENCOUNTER — Ambulatory Visit (INDEPENDENT_AMBULATORY_CARE_PROVIDER_SITE_OTHER): Payer: 59 | Admitting: Family Medicine

## 2016-09-29 ENCOUNTER — Encounter: Payer: Self-pay | Admitting: Family Medicine

## 2016-09-29 VITALS — BP 130/90 | HR 73 | Wt 196.0 lb

## 2016-09-29 DIAGNOSIS — M1A0410 Idiopathic chronic gout, right hand, without tophus (tophi): Secondary | ICD-10-CM

## 2016-09-29 DIAGNOSIS — S5400XA Injury of ulnar nerve at forearm level, unspecified arm, initial encounter: Secondary | ICD-10-CM

## 2016-09-29 DIAGNOSIS — K51919 Ulcerative colitis, unspecified with unspecified complications: Secondary | ICD-10-CM | POA: Diagnosis not present

## 2016-09-29 LAB — CBC WITH DIFFERENTIAL/PLATELET
BASOS PCT: 1 %
Basophils Absolute: 85 cells/uL (ref 0–200)
Eosinophils Absolute: 425 cells/uL (ref 15–500)
Eosinophils Relative: 5 %
HCT: 46.8 % (ref 38.5–50.0)
Hemoglobin: 15.8 g/dL (ref 13.2–17.1)
LYMPHS PCT: 29 %
Lymphs Abs: 2465 cells/uL (ref 850–3900)
MCH: 30.4 pg (ref 27.0–33.0)
MCHC: 33.8 g/dL (ref 32.0–36.0)
MCV: 90 fL (ref 80.0–100.0)
MONO ABS: 595 {cells}/uL (ref 200–950)
MONOS PCT: 7 %
MPV: 10.7 fL (ref 7.5–12.5)
Neutro Abs: 4930 cells/uL (ref 1500–7800)
Neutrophils Relative %: 58 %
PLATELETS: 243 10*3/uL (ref 140–400)
RBC: 5.2 MIL/uL (ref 4.20–5.80)
RDW: 13.4 % (ref 11.0–15.0)
WBC: 8.5 10*3/uL (ref 4.0–10.5)

## 2016-09-29 LAB — COMPREHENSIVE METABOLIC PANEL
ALK PHOS: 81 U/L (ref 40–115)
ALT: 21 U/L (ref 9–46)
AST: 27 U/L (ref 10–35)
Albumin: 4.7 g/dL (ref 3.6–5.1)
BILIRUBIN TOTAL: 0.6 mg/dL (ref 0.2–1.2)
BUN: 19 mg/dL (ref 7–25)
CO2: 21 mmol/L (ref 20–31)
Calcium: 9.7 mg/dL (ref 8.6–10.3)
Chloride: 104 mmol/L (ref 98–110)
Creat: 1.08 mg/dL (ref 0.70–1.33)
GLUCOSE: 103 mg/dL — AB (ref 65–99)
Potassium: 3.7 mmol/L (ref 3.5–5.3)
Sodium: 138 mmol/L (ref 135–146)
Total Protein: 7.2 g/dL (ref 6.1–8.1)

## 2016-09-29 NOTE — Progress Notes (Addendum)
   Subjective:    Patient ID: Daniel Buck, male    DOB: 1965/04/14, 51 y.o.   MRN: [id number]  HPI He is here for consult concerning multiple complaints. Approximate 7 weeks ago while working out he noted the onset of left elbow pain and did feel a popping sensation while doing he notes that this is especially painful when he has his elbow bent and he does pronation/supination of his wrist. Over the last several days he has also noted. Arthritic aches and pains. He notes especially the right third MCP joint as well as ankles and knees. He recently finished a steroid taper for treatment of underlying ulcerative colitis. He also has a previous history of gout and apparently was on allopurinol was stopped taking that. His complaint back and was mainly great toe.   Review of Systems     Objective:   Physical Exam Left elbow exam does show tenderness over the ulnar notch with pain with supination and pronation. Good motion of the elbow. No joint effusion noted. Exam of his hands shows no swelling except for the right third MCP. Knees and ankles shows no swelling or effusion or pain.       Assessment & Plan:  Chronic gout of right hand, unspecified cause - Plan: Uric Acid, CBC with Differential/Platelet, Comprehensive metabolic panel  Ulnar nerve damage, initial encounter  Ulcerative colitis with complication, unspecified location [facility]) Some of his symptoms are certainly suggestive of gout. We will follow-up on that. I will do the initial blood work and refer him back to his previous physician for probable management of allopurinol and to prevent further attacks. The ulnar nerve symptoms seem to be possibly subluxation of the ulnar nerve from the notch although certainly could be a tendinitis type issue. He has an appointment to see Dr. Ninfa Linden and encouraged him to mention the elbow pain and clicking sensation. 10/02/16 I left him a message concerning the uric acid recommending that he follow-up  with his PCP. Did recommend using 2 Aleve twice per day to help with the inflammation.

## 2016-09-30 LAB — URIC ACID: Uric Acid, Serum: 7.7 mg/dL (ref 4.0–8.0)

## 2016-10-02 MED FILL — VIIBRYD 40 MG TABLET: 40 | 30 days supply | Qty: 30 | Fill #3

## 2016-10-04 ENCOUNTER — Encounter (INDEPENDENT_AMBULATORY_CARE_PROVIDER_SITE_OTHER): Payer: Self-pay | Admitting: Physician Assistant

## 2016-10-04 ENCOUNTER — Ambulatory Visit (INDEPENDENT_AMBULATORY_CARE_PROVIDER_SITE_OTHER): Payer: 59 | Admitting: Physician Assistant

## 2016-10-04 ENCOUNTER — Ambulatory Visit (INDEPENDENT_AMBULATORY_CARE_PROVIDER_SITE_OTHER): Payer: 59

## 2016-10-04 DIAGNOSIS — M47812 Spondylosis without myelopathy or radiculopathy, cervical region: Secondary | ICD-10-CM | POA: Insufficient documentation

## 2016-10-04 DIAGNOSIS — M25522 Pain in left elbow: Secondary | ICD-10-CM | POA: Diagnosis not present

## 2016-10-04 DIAGNOSIS — K219 Gastro-esophageal reflux disease without esophagitis: Secondary | ICD-10-CM | POA: Insufficient documentation

## 2016-10-04 DIAGNOSIS — M7712 Lateral epicondylitis, left elbow: Secondary | ICD-10-CM | POA: Diagnosis not present

## 2016-10-04 DIAGNOSIS — E7800 Pure hypercholesterolemia, unspecified: Secondary | ICD-10-CM | POA: Insufficient documentation

## 2016-10-04 MED ORDER — METHYLPREDNISOLONE ACETATE 40 MG/ML IJ SUSP
40.0000 mg | INTRAMUSCULAR | Status: AC | PRN
  Administered 2016-10-04: 40 mg

## 2016-10-04 MED ORDER — LIDOCAINE HCL 1 % IJ SOLN
3.0000 mL | INTRAMUSCULAR | Status: AC | PRN
  Administered 2016-10-04: 3 mL

## 2016-10-04 NOTE — Progress Notes (Signed)
Office Visit Note   Patient: Daniel Buck           Date of Birth: 10-29-1965           MRN: [id number] Visit Date: 10/04/2016              Requested by: [name], MD [address] [place], [place] [address] PCP: [name], MD   Assessment & Plan: Visit Diagnoses:  1. Pain in left elbow   2. Lateral epicondylitis, left elbow     Plan: He'll continue to refrain from upper body workouts at next 2 weeks. [place] in 2 weeks check his response to the injection. He is unable to take NSAIDs due to effects is on Plavix with a history of coronary artery disease and history of stent placement in 1996. This pain persists despite time and the injection would recommend MRI to evaluate elbow for any cartilage defects.  Follow-Up Instructions: Return in about 2 weeks (around 10/18/2016).   Orders:  Orders Placed This Encounter  Procedures  . Hand/Upper Extremity Injection/Arthrocentesis  . XR Elbow 2 Views Left   No orders of the defined types were placed in this encounter.     Procedures: Hand/UE Inj Date/Time: 10/04/2016 4:58 PM Performed by: Pete Pelt Authorized by: Pete Pelt   Consent Given by:  Patient Indications:  Pain Condition: lateral epicondylitis   Needle Size:  25 G Approach:  Lateral Medications:  40 mg methylPREDNISolone acetate 40 MG/ML; 3 mL lidocaine 1 %     Clinical Data: No additional findings.   Subjective: Chief Complaint  Patient presents with  . Left Elbow - Pain    HPI Mr. Kihara is a 51 year old male comes in today with left elbow pain for the past 2 months. No known injury. Does work a lot doing pushups pull-ups dips states that that is her his elbow. Has a grinding-like sensation and elbow at times and a popping sensation is 8-9 out of 10 pain when it occurs. Points lateral aspect of the elbow where the popping occurs. Notes swelling of the elbow at times. No known injury. She tried icy. He is also on  oral prednisone for ulcerative colitis 20 mg daily during the past 2 months and had no real relief of his elbow pain. Occasionally has pain on medial aspect of the elbow and on the posterior aspect the elbow also. He is stopped upper body workouts in the past 2 weeks due to the pain. Review of Systems Please see history of present illness otherwise negative  Objective: Vital Signs: There were no vitals taken for this visit.  Physical Exam  Constitutional: He is oriented to person, place, and time. He appears well-developed and well-nourished. No distress.  Pulmonary/Chest: Effort normal.  Neurological: He is alert and oriented to person, place, and time.  Skin: He is not diaphoretic.  Psychiatric: He has a normal mood and affect. His behavior is normal.    Ortho Exam Bilateral elbows he has full extension and full flexion. Full supination pronation both forearms. Extension of left wrist against resistance and resisted supination and pronation causes no pain at the elbow. He has 5 out of 5 strengths resistance of extension left arm Right elbow is nontender throughout. He has tenderness over the lateral epicondyle left elbow and also over the triceps insertion left elbow. No erythema ecchymosis or edema rashes or skin lesions of either elbow or forearm. Radial pulses are 2+ equal and symmetric. Sensation  grossly intact bilateral hands. Specialty Comments:  No specialty comments available.  Imaging: Xr Elbow 2 Views Left  Result Date: 10/04/2016 Left elbow AP lateral: No acute fractures. No subluxation dislocation. Elbow joints well maintained. No bony abnormalities or lesions.    PMFS History: Patient Active Problem List   Diagnosis Date Noted  . GERD (gastroesophageal reflux disease) 10/04/2016  . Cervical spondylosis 10/04/2016  . Hypercholesteremia 10/04/2016  . Orthostatic dizziness 01/20/2016  . Memory loss 01/19/2016  . Carpal tunnel syndrome, right 12/15/2015  . Carpal tunnel  syndrome on right 01/20/2015  . Osteoarthritis of left wrist 01/20/2015  . Heart palpitations 02/06/2014  . Other malaise and fatigue 08/28/2013  . Myocardial infarct, old   . CAD S/P percutaneous coronary angioplasty   . Hyperlipidemia LDL goal <70   . Essential hypertension, benign 02/02/2011  . Ulcerative colitis ([place]) 02/02/2011   Past Medical History:  Diagnosis Date  . CAD S/P percutaneous coronary angioplasty 1996, 2002   a. s/p Inf MI (age 72) >>PCI to RCA with tandem BMS (PS 1535); b. LHC 06/2000: pRCA 30-40%, prox/mid stents with 75% ISR, 30-40% at crux in RCA >> PCI: Cutting Balloon atherectomy for ISR; c. 2012 MV: inf ischemia, no scar.  . Carpal tunnel syndrome on left 12/2011  . Chronic nasal congestion   . Depression 01/19/2012  . GERD (gastroesophageal reflux disease)   . Gout   . History of Doppler ultrasound    a. Carotid US 3/11: no ICA stenosis   . History of echocardiogram    a. Echo 6/14: mod LVH, EF 50-55%, inf-lat HK, Gr 1 DD, mild LAE  . Hyperlipidemia LDL goal < 70   . Hypertension    under control; has been on med. since 1996  . Myocardial infarct, old 1996   PCI  . Ulcerative colitis ([place])     Family History  Problem Relation Age of Onset  . AAA (abdominal aortic aneurysm) Father        Died from ruptured AAA despite surgery  . CAD Father   . Atrial fibrillation Father   . Aortic stenosis Mother        Status post aVR    Past Surgical History:  Procedure Laterality Date  . CARPAL TUNNEL RELEASE  01/26/2012   Procedure: CARPAL TUNNEL RELEASE;  Surgeon: Cammie Sickle., MD;  Location: [facility];  Service: Orthopedics;  Laterality: Left;  . CORONARY ANGIOPLASTY  07/11/2000   Cutting Balloon PTCA for RCA ISR  . CORONARY STENT PLACEMENT  1996   [id number] BMS x 2 in RCA  . CYSTECTOMY  as a child   from neck  . NM MYOVIEW LTD  December 2012   Inferior infarct but no ischemia  . TONSILLECTOMY  as a child  . TRANSTHORACIC  ECHOCARDIOGRAM  June 2014   APH: EF 50-50%, moderate concentric LVH. Mild HK of the inferolateral wall. Gr1 DD;    Social History   Occupational History  . Not on file.   Social History Main Topics  . Smoking status: Former Smoker    Quit date: 03/27/1992  . Smokeless tobacco: Current User    Types: Snuff, Chew  . Alcohol use No  . Drug use: No  . Sexual activity: Not on file

## 2016-10-09 ENCOUNTER — Ambulatory Visit (INDEPENDENT_AMBULATORY_CARE_PROVIDER_SITE_OTHER): Payer: 59 | Admitting: Physician Assistant

## 2016-10-09 ENCOUNTER — Ambulatory Visit (INDEPENDENT_AMBULATORY_CARE_PROVIDER_SITE_OTHER): Payer: 59 | Admitting: Orthopaedic Surgery

## 2016-10-18 ENCOUNTER — Ambulatory Visit (INDEPENDENT_AMBULATORY_CARE_PROVIDER_SITE_OTHER): Payer: 59 | Admitting: Orthopaedic Surgery

## 2016-10-18 DIAGNOSIS — M25522 Pain in left elbow: Secondary | ICD-10-CM | POA: Diagnosis not present

## 2016-10-18 NOTE — Progress Notes (Signed)
The patient returned for follow-up after having an injection over the lateral epicondylar area of his left elbow. He still gets a lot of grinding his elbow and he has some triceps pain on that side as well. He is someone who does a lot of working on bench pressing. He said the elbow is feeling better that knee. On examination his pain is minimal his elbow range of motions full. Most his pain is over the triceps area. His elbow is ligamentously stable.  At this point I really recommended more or less the tried to rest from any working out including bench pressing pushups and heavy biceps work. We'll try some topical anti-inflammatories as well. He can't take traditional anti-inflammatories due to ulcerative colitis. He'll otherwise follow up as needed.

## 2016-11-02 MED FILL — CLOPIDOGREL 75 MG TABLET: 75 | 90 days supply | Qty: 90 | Fill #1

## 2016-11-02 MED FILL — VIIBRYD 40 MG TABLET: 40 | 30 days supply | Qty: 30 | Fill #4

## 2016-11-20 MED FILL — LIALDA 1.2 GM TABLET SA: 1.2 | 30 days supply | Qty: 120 | Fill #0

## 2016-11-28 DIAGNOSIS — Z125 Encounter for screening for malignant neoplasm of prostate: Secondary | ICD-10-CM | POA: Diagnosis not present

## 2016-11-28 DIAGNOSIS — E7800 Pure hypercholesterolemia, unspecified: Secondary | ICD-10-CM | POA: Diagnosis not present

## 2016-11-28 DIAGNOSIS — Z0000 Encounter for general adult medical examination without abnormal findings: Secondary | ICD-10-CM | POA: Diagnosis not present

## 2016-11-28 DIAGNOSIS — N183 Chronic kidney disease, stage 3 (moderate): Secondary | ICD-10-CM | POA: Diagnosis not present

## 2016-11-28 DIAGNOSIS — F411 Generalized anxiety disorder: Secondary | ICD-10-CM | POA: Diagnosis not present

## 2016-11-28 DIAGNOSIS — M109 Gout, unspecified: Secondary | ICD-10-CM | POA: Diagnosis not present

## 2016-11-28 DIAGNOSIS — I2510 Atherosclerotic heart disease of native coronary artery without angina pectoris: Secondary | ICD-10-CM | POA: Diagnosis not present

## 2016-11-28 DIAGNOSIS — K219 Gastro-esophageal reflux disease without esophagitis: Secondary | ICD-10-CM | POA: Diagnosis not present

## 2016-11-28 DIAGNOSIS — I130 Hypertensive heart and chronic kidney disease with heart failure and stage 1 through stage 4 chronic kidney disease, or unspecified chronic kidney disease: Secondary | ICD-10-CM | POA: Diagnosis not present

## 2016-11-28 DIAGNOSIS — K5190 Ulcerative colitis, unspecified, without complications: Secondary | ICD-10-CM | POA: Diagnosis not present

## 2016-12-04 MED FILL — VIIBRYD 40 MG TABLET: 40 | 30 days supply | Qty: 30 | Fill #5

## 2016-12-14 DIAGNOSIS — H524 Presbyopia: Secondary | ICD-10-CM | POA: Diagnosis not present

## 2016-12-27 DIAGNOSIS — G5601 Carpal tunnel syndrome, right upper limb: Secondary | ICD-10-CM | POA: Diagnosis not present

## 2016-12-29 ENCOUNTER — Other Ambulatory Visit: Payer: Self-pay | Admitting: Cardiology

## 2016-12-29 MED FILL — SIMVASTATIN 40 MG TABLET: 40 | 90 days supply | Qty: 90 | Fill #0

## 2016-12-29 MED FILL — VIIBRYD 40 MG TABLET: 40 | 30 days supply | Qty: 30 | Fill #0

## 2016-12-29 NOTE — Telephone Encounter (Signed)
Rx(s) sent to pharmacy electronically.  

## 2017-01-29 ENCOUNTER — Other Ambulatory Visit: Payer: Self-pay | Admitting: Cardiology

## 2017-01-29 MED FILL — CLOPIDOGREL 75 MG TABLET: 75 | 90 days supply | Qty: 90 | Fill #0

## 2017-01-29 MED FILL — VIIBRYD 40 MG TABLET: 40 | 30 days supply | Qty: 30 | Fill #1

## 2017-02-21 MED FILL — COLCHICINE 0.6 MG TABS: 0.6 | 30 days supply | Qty: 30 | Fill #0

## 2017-03-01 MED FILL — VIIBRYD 40 MG TABLET: 40 | 30 days supply | Qty: 30 | Fill #2

## 2017-03-06 ENCOUNTER — Ambulatory Visit
Admission: RE | Admit: 2017-03-06 | Discharge: 2017-03-06 | Disposition: A | Discharge status: Home / Self Care | Payer: 59 | Source: Ambulatory Visit | Attending: Family Medicine | Admitting: Family Medicine

## 2017-03-06 ENCOUNTER — Other Ambulatory Visit: Payer: Self-pay | Admitting: Family Medicine

## 2017-03-06 ENCOUNTER — Other Ambulatory Visit (INDEPENDENT_AMBULATORY_CARE_PROVIDER_SITE_OTHER): Payer: 59

## 2017-03-06 DIAGNOSIS — M79671 Pain in right foot: Secondary | ICD-10-CM

## 2017-03-06 DIAGNOSIS — M19071 Primary osteoarthritis, right ankle and foot: Secondary | ICD-10-CM | POA: Diagnosis not present

## 2017-03-06 DIAGNOSIS — Z23 Encounter for immunization: Secondary | ICD-10-CM | POA: Diagnosis not present

## 2017-03-15 ENCOUNTER — Ambulatory Visit (INDEPENDENT_AMBULATORY_CARE_PROVIDER_SITE_OTHER): Payer: 59 | Admitting: Orthopaedic Surgery

## 2017-03-28 ENCOUNTER — Other Ambulatory Visit: Payer: Self-pay | Admitting: Cardiology

## 2017-03-28 MED FILL — SIMVASTATIN 40 MG TABLET: 40 | 30 days supply | Qty: 30 | Fill #0

## 2017-03-28 NOTE — Telephone Encounter (Signed)
Rx has been sent to the pharmacy electronically. ° °

## 2017-04-04 MED FILL — LIALDA 1.2 GM TABLET SA: 1.2 | 30 days supply | Qty: 120 | Fill #1

## 2017-04-04 MED FILL — VIIBRYD 40 MG TABLET: 40 | 30 days supply | Qty: 30 | Fill #3

## 2017-04-05 ENCOUNTER — Ambulatory Visit (INDEPENDENT_AMBULATORY_CARE_PROVIDER_SITE_OTHER): Payer: 59 | Admitting: Orthopaedic Surgery

## 2017-04-19 ENCOUNTER — Ambulatory Visit (INDEPENDENT_AMBULATORY_CARE_PROVIDER_SITE_OTHER): Payer: 59 | Admitting: Orthopaedic Surgery

## 2017-04-19 ENCOUNTER — Encounter (INDEPENDENT_AMBULATORY_CARE_PROVIDER_SITE_OTHER): Payer: Self-pay | Admitting: Orthopaedic Surgery

## 2017-04-19 DIAGNOSIS — M79671 Pain in right foot: Secondary | ICD-10-CM

## 2017-04-19 MED ORDER — ALLOPURINOL 300 MG PO TABS: 300.0000 mg | ORAL_TABLET | Freq: Every day | ORAL | 3 refills | Status: DC

## 2017-04-19 MED ORDER — METHYLPREDNISOLONE 4 MG PO TABS: ORAL_TABLET | ORAL | 0 refills | Status: DC

## 2017-04-19 MED FILL — ALLOPURINOL 300 MG TABLET: 300 | 30 days supply | Qty: 30 | Fill #0

## 2017-04-19 MED FILL — METHYLPREDNISOLONE 4 MG TAB: 4 | 6 days supply | Qty: 21 | Fill #0

## 2017-04-19 NOTE — Progress Notes (Signed)
Office Visit Note   Patient: Daniel Buck           Date of Birth: 1966/02/03           MRN: [id number] Visit Date: 04/19/2017              Requested by: [name], MD [address] [place], [place] [address] PCP: [name], MD   Assessment & Plan: Visit Diagnoses:  1. Pain in left foot     Plan: I do feel that he would benefit from getting back on allopurinol 300 mg daily.  We will try a 6-day steroid taper and over-the-counter topical anti-inflammatories.  I like to see him back in a month see how he is doing overall.  Follow-Up Instructions: Return in about 4 weeks (around 05/17/2017).   Orders:  No orders of the defined types were placed in this encounter.  Meds ordered this encounter  Medications  . methylPREDNISolone (MEDROL) 4 MG tablet    Sig: Medrol dose pack. Take as instructed    Dispense:  21 tablet    Refill:  0  . allopurinol (ZYLOPRIM) 300 MG tablet    Sig: Take 1 tablet (300 mg total) by mouth daily.    Dispense:  30 tablet    Refill:  3      Procedures: No procedures performed   Clinical Data: No additional findings.   Subjective: Chief Complaint  Patient presents with  . Right Foot - Pain  The patient is here today with right foot pain.  He points all around the first and second metatarsals as a source of his pain.  He denies any injuries.  He does have a history of gout but has been off gout medications for some time.  He is to take 300 mg allopurinol daily.  He did have a flareup of redness on his great toe that streaks up his ankle just recently.  He took colchicine and that has helped.  He hurts with pushing off of that great toe and feels like it is on the top and plantar surface around that joint.  He denies any fever, chills, nausea, vomiting.  Is not a diabetic.  He denies any numbness and tingling.  He does state when he wears a stiff work boots he feels much better.  Walking barefoot is more  painful.  HPI  Review of Systems He currently denies any headache, chest pain, shortness of breath, fever, chills, nausea, vomiting.  Objective: Vital Signs: There were no vitals taken for this visit.  Physical Exam He is alert and oriented x3 and in no acute distress Ortho Exam Examination of his right foot shows no redness.  He has full range of motion of the MTP joint passively.  He hurts on extremes of motion of the MTP joint.  He is very tender in the medial sesamoid.  He otherwise has a well-perfused foot with normal sensation.  There is no pain between the interspaces of any of the toes. Specialty Comments:  No specialty comments available.  Imaging: No results found. X-rays independently review of his right foot showed no acute findings.  There is some mild degenerative changes at the first MTP joint but no evidence of erosive changes.  There is a bipartite medial sesamoid versus stress fracture.  PMFS History: Patient Active Problem List   Diagnosis Date Noted  . Pain in left foot 04/19/2017  . Pain in left elbow 10/18/2016  . GERD (gastroesophageal reflux  disease) 10/04/2016  . Cervical spondylosis 10/04/2016  . Hypercholesteremia 10/04/2016  . Orthostatic dizziness 01/20/2016  . Memory loss 01/19/2016  . Carpal tunnel syndrome, right 12/15/2015  . Carpal tunnel syndrome on right 01/20/2015  . Osteoarthritis of left wrist 01/20/2015  . Heart palpitations 02/06/2014  . Other malaise and fatigue 08/28/2013  . Myocardial infarct, old   . CAD S/P percutaneous coronary angioplasty   . Hyperlipidemia LDL goal <70   . Essential hypertension, benign 02/02/2011  . Ulcerative colitis ([place]) 02/02/2011   Past Medical History:  Diagnosis Date  . CAD S/P percutaneous coronary angioplasty 1996, 2002   a. s/p Inf MI (age 79) >>PCI to RCA with tandem BMS (PS 1535); b. LHC 06/2000: pRCA 30-40%, prox/mid stents with 75% ISR, 30-40% at crux in RCA >> PCI: Cutting Balloon atherectomy  for ISR; c. 2012 MV: inf ischemia, no scar.  . Carpal tunnel syndrome on left 12/2011  . Chronic nasal congestion   . Depression 01/19/2012  . GERD (gastroesophageal reflux disease)   . Gout   . History of Doppler ultrasound    a. Carotid US 3/11: no ICA stenosis   . History of echocardiogram    a. Echo 6/14: mod LVH, EF 50-55%, inf-lat HK, Gr 1 DD, mild LAE  . Hyperlipidemia LDL goal < 70   . Hypertension    under control; has been on med. since 1996  . Myocardial infarct, old 1996   PCI  . Ulcerative colitis ([place])     Family History  Problem Relation Age of Onset  . AAA (abdominal aortic aneurysm) Father        Died from ruptured AAA despite surgery  . CAD Father   . Atrial fibrillation Father   . Aortic stenosis Mother        Status post aVR    Past Surgical History:  Procedure Laterality Date  . CARPAL TUNNEL RELEASE  01/26/2012   Procedure: CARPAL TUNNEL RELEASE;  Surgeon: Cammie Sickle., MD;  Location: [facility];  Service: Orthopedics;  Laterality: Left;  . CORONARY ANGIOPLASTY  07/11/2000   Cutting Balloon PTCA for RCA ISR  . CORONARY STENT PLACEMENT  1996   [id number] BMS x 2 in RCA  . CYSTECTOMY  as a child   from neck  . NM MYOVIEW LTD  December 2012   Inferior infarct but no ischemia  . TONSILLECTOMY  as a child  . TRANSTHORACIC ECHOCARDIOGRAM  June 2014   APH: EF 50-50%, moderate concentric LVH. Mild HK of the inferolateral wall. Gr1 DD;    Social History   Occupational History  . Not on file  Tobacco Use  . Smoking status: Former Smoker    Last attempt to quit: 03/27/1992    Years since quitting: 25.0  . Smokeless tobacco: Current User    Types: Snuff, Chew  Substance and Sexual Activity  . Alcohol use: No  . Drug use: No  . Sexual activity: Not on file

## 2017-04-24 ENCOUNTER — Other Ambulatory Visit: Payer: Self-pay | Admitting: Cardiology

## 2017-04-24 MED FILL — SIMVASTATIN 40 MG TABLET: 40 | 15 days supply | Qty: 15 | Fill #0

## 2017-04-24 MED FILL — ESOMEPRAZOLE MAG DR 40 MG C: 40 | 90 days supply | Qty: 180 | Fill #1

## 2017-04-24 MED FILL — CLOPIDOGREL 75 MG TABLET: 75 | 90 days supply | Qty: 90 | Fill #1

## 2017-04-25 ENCOUNTER — Telehealth: Payer: Self-pay | Admitting: Family Medicine

## 2017-04-25 MED ORDER — OSELTAMIVIR PHOSPHATE 75 MG PO CAPS: 75.0000 mg | ORAL_CAPSULE | Freq: Every day | ORAL | 0 refills | Status: DC

## 2017-04-25 MED FILL — OSELTAMIVIR PHOSPHATE 75 MG: 75 | 7 days supply | Qty: 7 | Fill #0

## 2017-04-25 NOTE — Telephone Encounter (Signed)
Wife tested positive for flu and would like Tamiflu sent to [facility]

## 2017-04-30 ENCOUNTER — Other Ambulatory Visit: Payer: Self-pay | Admitting: Cardiology

## 2017-04-30 MED ORDER — SIMVASTATIN 40 MG PO TABS: 40.0000 mg | ORAL_TABLET | Freq: Every day | ORAL | 0 refills | Status: DC

## 2017-04-30 NOTE — Telephone Encounter (Signed)
°*  STAT* If patient is at the pharmacy, call can be transferred to refill team.   1. Which medications need to be refilled? (please list name of each medication and dose if known)   simvastatin (ZOCOR) 40 MG tablet    2. Which pharmacy/location (including street and city if local pharmacy) is medication to be sent to? [facility]   3. Do they need a 30 day or 90 day supply? [place]

## 2017-04-30 NOTE — Telephone Encounter (Signed)
Rx(s) sent to pharmacy electronically.  

## 2017-05-02 MED FILL — VIIBRYD 40 MG TABLET: 40 | 30 days supply | Qty: 30 | Fill #4

## 2017-05-08 MED FILL — predniSONE 20 MG TABS: 20 | 30 days supply | Qty: 30 | Fill #0

## 2017-05-15 MED FILL — SIMVASTATIN 40 MG TABLET: 40 | 90 days supply | Qty: 90 | Fill #0

## 2017-05-21 ENCOUNTER — Ambulatory Visit (INDEPENDENT_AMBULATORY_CARE_PROVIDER_SITE_OTHER): Payer: 59 | Admitting: Orthopaedic Surgery

## 2017-05-28 DIAGNOSIS — K5190 Ulcerative colitis, unspecified, without complications: Secondary | ICD-10-CM | POA: Diagnosis not present

## 2017-05-28 MED FILL — predniSONE 20 MG TABS: 20 | 30 days supply | Qty: 60 | Fill #0

## 2017-05-28 MED FILL — HYDROCORTISONE 100 MG/60 ML: 100 | 14 days supply | Qty: 840 | Fill #0

## 2017-05-29 MED FILL — LIALDA 1.2 GM TABLET SA: 1.2 | 30 days supply | Qty: 120 | Fill #2

## 2017-05-29 MED FILL — VIIBRYD 40 MG TABLET: 40 | 30 days supply | Qty: 30 | Fill #5

## 2017-06-07 ENCOUNTER — Ambulatory Visit: Payer: 59 | Admitting: Cardiology

## 2017-06-15 DIAGNOSIS — K5190 Ulcerative colitis, unspecified, without complications: Secondary | ICD-10-CM | POA: Diagnosis not present

## 2017-06-20 DIAGNOSIS — K6289 Other specified diseases of anus and rectum: Secondary | ICD-10-CM | POA: Diagnosis not present

## 2017-06-20 DIAGNOSIS — K5190 Ulcerative colitis, unspecified, without complications: Secondary | ICD-10-CM | POA: Diagnosis not present

## 2017-06-20 DIAGNOSIS — K626 Ulcer of anus and rectum: Secondary | ICD-10-CM | POA: Diagnosis not present

## 2017-06-25 MED FILL — HYDROCORTISONE ACETATE 25 M: 25 | 30 days supply | Qty: 60 | Fill #0

## 2017-06-29 MED FILL — predniSONE 10 MG TABS: 10 | 30 days supply | Qty: 30 | Fill #1

## 2017-06-29 MED FILL — VIIBRYD 40 MG TABLET: 40 | 30 days supply | Qty: 30 | Fill #0

## 2017-06-29 MED FILL — predniSONE 20 MG TABS: 20 | 30 days supply | Qty: 60 | Fill #1

## 2017-06-29 MED FILL — LIALDA 1.2 GM TABLET SA: 1.2 | 30 days supply | Qty: 120 | Fill #3

## 2017-07-26 ENCOUNTER — Other Ambulatory Visit: Payer: Self-pay | Admitting: Cardiology

## 2017-07-26 MED FILL — VIIBRYD 40 MG TABLET: 40 | 30 days supply | Qty: 30 | Fill #1

## 2017-07-26 MED FILL — LIALDA 1.2 GM TABLET SA: 1.2 | 30 days supply | Qty: 120 | Fill #4

## 2017-07-26 MED FILL — ESOMEPRAZOLE MAG DR 40 MG C: 40 | 90 days supply | Qty: 180 | Fill #2

## 2017-07-26 MED FILL — CLOPIDOGREL 75 MG TABLET: 75 | 90 days supply | Qty: 90 | Fill #0

## 2017-07-26 NOTE — Telephone Encounter (Signed)
REFILL 

## 2017-07-30 ENCOUNTER — Ambulatory Visit: Payer: 59 | Admitting: Cardiology

## 2017-07-30 ENCOUNTER — Other Ambulatory Visit: Payer: Self-pay | Admitting: Cardiology

## 2017-07-30 NOTE — Telephone Encounter (Signed)
refill 

## 2017-08-08 ENCOUNTER — Telehealth: Payer: Self-pay | Admitting: Pharmacist

## 2017-08-08 NOTE — Telephone Encounter (Signed)
Called patient to schedule an appointment for the Daniel Buck.   He requested that I call his wife Daniel Buck to discuss. Called patient's wife and she will discuss with him a good time to meet over the next two days to review his Humira. She will call me back in the morning.

## 2017-08-09 ENCOUNTER — Ambulatory Visit (INDEPENDENT_AMBULATORY_CARE_PROVIDER_SITE_OTHER): Payer: 59 | Admitting: Pharmacist

## 2017-08-09 DIAGNOSIS — Z79899 Other long term (current) drug therapy: Secondary | ICD-10-CM

## 2017-08-09 MED ORDER — ADALIMUMAB 80 MG/0.8ML ~~LOC~~ AJKT: 2.0000 "pen " | AUTO-INJECTOR | SUBCUTANEOUS | 0 refills | Status: DC

## 2017-08-09 MED FILL — HUMIRA PEN-CD/UC/HS STARTER: 80 | 28 days supply | Qty: 3 | Fill #0

## 2017-08-09 NOTE — Progress Notes (Signed)
   S: Patient presents to [facility] for review of their specialty medication therapy. Patient presents with his wife.  Patient is currently taking Humira for Ulcerative Colitis. Patient is managed by Dr. Watt Climes for this.   Adherence: has not started the medication yet  Efficacy: has not started the medication yet  Dosing:  Ulcerative colitis: SubQ (may continue aminosalicylates and/or corticosteroids; if necessary, azathioprine, or mercaptopurine may also be continued): Initial: 160 mg (given as four 40 mg injections on day 1 or given as two 40 mg injections per day over 2 consecutive days), then 80 mg 2 weeks later (day 15). Maintenance: 40 mg every other week beginning day 29. Note: Only continue maintenance dose in patients demonstrating clinical remission by 8 weeks (day 57) of therapy.  Dose adjustments: Renal: no dose adjustments (has not been studied) Hepatic: no dose adjustments (has not been studied)  Screening: TB test: completed per patient Hepatitis: completed per patient  Monitoring: S/sx of infection: denies - patient was told by physician to avoid getting cuts and then  entering water (streams, rivers, etc) while [place] hunting. CBC: see below, also monitored by GI per patient S/sx of hypersensitivity: has not started S/sx of malignancy: denies S/sx of heart failure: denies  Patient has a demo pen with him.  O:     Lab Results  Component Value Date   WBC 8.5 09/29/2016   HGB 15.8 09/29/2016   HCT 46.8 09/29/2016   MCV 90.0 09/29/2016   PLT 243 09/29/2016      Chemistry      Component Value Date/Time   NA 138 09/29/2016 1259   K 3.7 09/29/2016 1259   CL 104 09/29/2016 1259   CO2 21 09/29/2016 1259   BUN 19 09/29/2016 1259   CREATININE 1.08 09/29/2016 1259      Component Value Date/Time   CALCIUM 9.7 09/29/2016 1259   ALKPHOS 81 09/29/2016 1259   AST 27 09/29/2016 1259   ALT 21 09/29/2016 1259   BILITOT 0.6 09/29/2016 1259        A/P: 1. Medication review: Patient currently on Humira for ulcerative colitis. Reviewed the medication with the patient, including the following: Humira is a TNF blocking agent indicated for ankylosing spondylitis, Crohn's disease, Hidradenitis suppurativa, psoriatic arthritis, plaque psoriasis, ulcerative colitis, and uveitis. Patient educated on purpose, proper use and potential adverse effects of Humira. The most common adverse effects are infections, headache, and injection site reactions. There is the possibility of an increased risk of malignancy but it is not well understood if this increased risk is due to there medication or the disease state. There are rare cases of pancytopenia and aplastic anemia. Injection technique reviewed using demo pen. No recommendations for any changes at this time.   Christella Hartigan, PharmD, BCPS, BCACP, CPP Clinical Pharmacist Practitioner  [phone]

## 2017-08-21 ENCOUNTER — Encounter: Payer: Self-pay | Admitting: Adult Health

## 2017-08-21 ENCOUNTER — Ambulatory Visit (INDEPENDENT_AMBULATORY_CARE_PROVIDER_SITE_OTHER): Payer: 59 | Admitting: Adult Health

## 2017-08-21 ENCOUNTER — Telehealth: Payer: Self-pay | Admitting: Cardiology

## 2017-08-21 VITALS — BP 138/90 | HR 72 | Ht 71.0 in | Wt 201.4 lb

## 2017-08-21 DIAGNOSIS — I10 Essential (primary) hypertension: Secondary | ICD-10-CM

## 2017-08-21 DIAGNOSIS — I2510 Atherosclerotic heart disease of native coronary artery without angina pectoris: Secondary | ICD-10-CM | POA: Diagnosis not present

## 2017-08-21 DIAGNOSIS — E785 Hyperlipidemia, unspecified: Secondary | ICD-10-CM

## 2017-08-21 DIAGNOSIS — K2100 Gastro-esophageal reflux disease with esophagitis, without bleeding: Secondary | ICD-10-CM

## 2017-08-21 DIAGNOSIS — Z9861 Coronary angioplasty status: Secondary | ICD-10-CM

## 2017-08-21 DIAGNOSIS — R002 Palpitations: Secondary | ICD-10-CM | POA: Diagnosis not present

## 2017-08-21 DIAGNOSIS — E7800 Pure hypercholesterolemia, unspecified: Secondary | ICD-10-CM

## 2017-08-21 DIAGNOSIS — Z79899 Other long term (current) drug therapy: Secondary | ICD-10-CM

## 2017-08-21 DIAGNOSIS — R079 Chest pain, unspecified: Secondary | ICD-10-CM | POA: Diagnosis not present

## 2017-08-21 MED ORDER — PREDNISONE 10 MG (21) PO TBPK: ORAL_TABLET | ORAL | Status: DC

## 2017-08-21 MED ORDER — ATENOLOL 25 MG PO TABS: 12.5000 mg | ORAL_TABLET | Freq: Every day | ORAL | 0 refills | Status: DC | PRN

## 2017-08-21 MED ORDER — NITROGLYCERIN 0.4 MG SL SUBL: 0.4000 mg | SUBLINGUAL_TABLET | SUBLINGUAL | 3 refills | Status: DC | PRN

## 2017-08-21 MED FILL — NITROGLYCERIN 0.4 MG TAB SL: 0.4 | 20 days supply | Qty: 25 | Fill #0

## 2017-08-21 MED FILL — ATENOLOL 25 MG TABLET: 25 | 60 days supply | Qty: 30 | Fill #0

## 2017-08-21 NOTE — Patient Instructions (Signed)
Medication Instructions:  START ATENOLOL 12.5MG  DAILY AS NEEDED  If you need a refill on your cardiac medications before your next appointment, please call your pharmacy.  Labwork: A1C,BMET,CBC, MAG,LFT AND LIPID HERE IN OUR OFFICE AT [facility]  Take the provided lab slips with you to the lab for your blood draw.   Testing/Procedures: Echocardiogram - Your physician has requested that you have an echocardiogram. Echocardiography is a painless test that uses sound waves to create images of your heart. It provides your doctor with information about the size and shape of your heart and how well your heart's chambers and valves are working. This procedure takes approximately one hour. There are no restrictions for this procedure. This will be performed at our [facility] location - [address], [address].  Myocardial Perfusion Imaging Study, Please arrive 15 minutes prior to your scheduled appointment time for registration and insurance verification purposes.  The test will take approximately 3 to 4 hours to complete; you may bring reading material.  If someone comes with you to your appointment, they will need to remain in the main lobby due to limited space in the testing area. **If you are pregnant or breastfeeding, please notify the nuclear lab prior to your appointment**  How to prepare for your Myocardial Perfusion Test: . PLEASE HOLD ATENOLOL . Do not eat or drink 3 hours prior to your test, except you may have water. . Do not consume products containing caffeine (regular or decaffeinated) 12 hours prior to your test. (ex: coffee, chocolate, sodas, tea). . Do wear comfortable clothes (no dresses or overalls) and walking shoes, tennis shoes preferred (No heels or open toe shoes are allowed). . Do NOT wear cologne, perfume, aftershave, or lotions (deodorant is allowed). . If these instructions are not followed, your test will have to be rescheduled.  This will be at our  [facility] office  [place] [address]  If you cannot keep your appointment, please provide 24 hours notification to the Nuclear Lab, to avoid a possible $50 charge to your account.  Follow-Up: Your physician wants you to follow-up in: 3 [place]   Thank you for choosing [facility] at [facility]!!

## 2017-08-21 NOTE — Telephone Encounter (Signed)
New message   Pt c/o of Chest Pain: STAT if CP now or developed within 24 hours  1. Are you having CP right now? YES, CHEST PRESSURE , per spouse  2. Are you experiencing any other symptoms (ex. SOB, nausea, vomiting, sweating)? NO  3. How long have you been experiencing CP? 2 days  4. Is your CP continuous or coming and going? Coming and going  5. Have you taken Nitroglycerin?  NO ?

## 2017-08-21 NOTE — Progress Notes (Signed)
Cardiology Office Note   Date:  08/21/2017   ID:  [name], DOB 06/13/1965, MRN [id number]  PCP:  [name], MD  Cardiologist:  [place]  Chief Complaint  Patient presents with  . Chest Pain  . Coronary Artery Disease     History of Present Illness: Daniel Buck is a 52 y.o. male who presents for complaints of chest pressure. She has not been seen in the office since 12/2015 and has a history of CAD,with inferior MI at age 36,  with PCI to the RCA with over-laping BMS, and PTCA of ISR of the RCA stent in 2002. Negative myoview in 2012. She called our office for 2 day complaint of chest pressure.   He states the pressure was substernal, almost a cold raw feeling in the middle of his chest when he laid down last night, occurring most of the night and causing him to be uncomfortable. He does not endorse diaphoresis, dyspnea or radiation.[name] He also is now being treated for ulcerative colitis with Humera and a prednisone taper. He had taken himself off of atenolol and has not used this in over two years. He has noticed more palpitations lately. He states this am, he is feeling better. He has been urinating a lot this morning, which is unusual for him, but with the urination he has had relief of his chest pressure.   Past Medical History:  Diagnosis Date  . CAD S/P percutaneous coronary angioplasty 1996, 2002   a. s/p Inf MI (age 60) >>PCI to RCA with tandem BMS (PS 1535); b. LHC 06/2000: pRCA 30-40%, prox/mid stents with 75% ISR, 30-40% at crux in RCA >> PCI: Cutting Balloon atherectomy for ISR; c. 2012 MV: inf ischemia, no scar.  . Carpal tunnel syndrome on left 12/2011  . Chronic nasal congestion   . Depression 01/19/2012  . GERD (gastroesophageal reflux disease)   . Gout   . History of Doppler ultrasound    a. Carotid US 3/11: no ICA stenosis   . History of echocardiogram    a. Echo 6/14: mod LVH, EF 50-55%, inf-lat HK, Gr 1 DD, mild LAE  . Hyperlipidemia LDL goal < 70   .  Hypertension    under control; has been on med. since 1996  . Myocardial infarct, old 1996   PCI  . Ulcerative colitis [facility])     Past Surgical History:  Procedure Laterality Date  . CARPAL TUNNEL RELEASE  01/26/2012   Procedure: CARPAL TUNNEL RELEASE;  Surgeon: Cammie Sickle., MD;  Location: [facility];  Service: Orthopedics;  Laterality: Left;  . CORONARY ANGIOPLASTY  07/11/2000   Cutting Balloon PTCA for RCA ISR  . CORONARY STENT PLACEMENT  1996   [id number] BMS x 2 in RCA  . CYSTECTOMY  as a child   from neck  . NM MYOVIEW LTD  December 2012   Inferior infarct but no ischemia  . TONSILLECTOMY  as a child  . TRANSTHORACIC ECHOCARDIOGRAM  June 2014   APH: EF 50-50%, moderate concentric LVH. Mild HK of the inferolateral wall. Gr1 DD;      Current Outpatient Medications  Medication Sig Dispense Refill  . acetaminophen (TYLENOL) 500 MG tablet Take 1,000 mg by mouth every 4 (four) hours as needed for mild pain or moderate pain.     . Adalimumab (HUMIRA PEN-CD/UC/HS STARTER) 80 MG/0.8ML PNKT Inject 2 pens into the skin as directed. On Day 1 then 1 pen under the skin on Day  15. 1 each 0  . clopidogrel (PLAVIX) 75 MG tablet Take 1 tablet (75 mg total) by mouth daily. KEEP OV. 90 tablet 0  . esomeprazole (NEXIUM) 40 MG capsule Take 40 mg by mouth daily.     [name] LIALDA 1.2 g EC tablet     . Multiple Vitamins-Minerals (MULTIVITAMIN WITH MINERALS) tablet Take 1 tablet by mouth daily.      . simvastatin (ZOCOR) 40 MG tablet TAKE 1 TABLET BY MOUTH AT BEDTIME. 90 tablet 0  . Vilazodone HCl (VIIBRYD) 40 MG TABS Take 40 mg by mouth daily.    [name] atenolol (TENORMIN) 25 MG tablet Take 0.5 tablets (12.5 mg total) by mouth daily as needed (PALPITATIONS). 30 tablet 0  . nitroGLYCERIN (NITROSTAT) 0.4 MG SL tablet Place 1 tablet (0.4 mg total) under the tongue every 5 (five) minutes as needed for chest pain. 90 tablet 3  . predniSONE (STERAPRED UNI-PAK 21 TAB) 10 MG (21) TBPK tablet AS  DIRECTED     No current facility-administered medications for this visit.     Allergies:   Patient has no known allergies.    Social History:  The patient  reports that he quit smoking about 25 years ago. His smokeless tobacco use includes snuff and chew. He reports that he does not drink alcohol or use drugs.   Family History:  The patient's family history includes AAA (abdominal aortic aneurysm) in his father; Aortic stenosis in his mother; Atrial fibrillation in his father; CAD in his father.    ROS: All other systems are reviewed and negative. Unless otherwise mentioned in H&P    PHYSICAL EXAM: VS:  BP 138/90   Pulse 72   Ht 5\' 11"  (1.803 m)   Wt 201 lb 6.4 oz (91.4 kg)   BMI 28.09 kg/m  , BMI Body mass index is 28.09 kg/m. GEN: Well nourished, well developed, in no acute distress  HEENT: normal  Neck: no JVD, carotid bruits, or masses Cardiac: RRR; no murmurs, rubs, or gallops,no edema  Respiratory:  clear to auscultation bilaterally, normal work of breathing GI: soft, nontender, nondistended, + BS MS: no deformity or atrophy  Skin: warm and dry, no rash Neuro:  Strength and sensation are intact Psych: euthymic mood, full affect   EKG:  NSR rate of 72 bpm.   Recent Labs: 09/29/2016: ALT 21; BUN 19; Creat 1.08; Hemoglobin 15.8; Platelets 243; Potassium 3.7; Sodium 138    Lipid Panel    Component Value Date/Time   CHOL 218 (H) 06/14/2016 0822   TRIG 123 06/14/2016 0822   HDL 33 (L) 06/14/2016 0822   CHOLHDL 6.6 06/14/2016 0822   VLDL 25 06/14/2016 0822   LDLCALC 160 (H) 06/14/2016 0822      Wt Readings from Last 3 Encounters:  08/21/17 201 lb 6.4 oz (91.4 kg)  09/29/16 196 lb (88.9 kg)  05/22/16 198 lb (89.8 kg)      Other studies Reviewed:  Echocardiogram 08/28/2012  Left ventricle: The cavity size was mildly reduced. Wall thickness was increased in a pattern of moderate LVH. There was moderate concentric hypertrophy. Systolic function was  normal. The estimated ejection fraction was in the range of 50% to 55%. Mild hypokinesis of the inferolateral myocardium. Doppler parameters are consistent with abnormal left ventricular relaxation (grade 1 diastolic dysfunction). - Left atrium: The atrium was mildly dilated. - Atrial septum: No defect or patent foramen ovale was identified.  ASSESSMENT AND PLAN:  1. Recurrent chest pain: Typical and atypical features, located  substernal, described as a pressure and a cold raw feeling. He has had relief this am since urinating. Each time he urinates the pain decreases and is almost completely eliminated now.   Due to his history of CAD, I will repeat his stress test for evaluation of progression of CAD. I will also repeat his echocardiogram to assess for changes in LV fx. Labs to include Mg, CBC, and BMET to evaluate lab status will be drawn. I have given him refills on NTG  2. CAD: PCI to RCA X 2. Restart BB.  3. Palpitations: I will refill atenolol 12.5 mg daily. This ,may be related to use of steroids in the setting of ulcerative colitis.   4. Hypertension: Slightly elevated today diastolic reading. Will follow this.   5. Hypercholesterolemia: Check lipid status.   4. Ulcerative colitis: Followed by GI.   Current medicines are reviewed at length with the patient today.    Labs/ tests ordered today include: CBC, Mg , BMET, Stress Myoview, and Echp Phill Myron. [place] Pugh, ANP, AACC    08/21/2017 12:22 PM    [facility] [address], [place], [place] [address] Phone: [phone]; Fax: [phone]

## 2017-08-21 NOTE — Telephone Encounter (Signed)
Spoke to patient . He states great deal urination yesterday x 3 after that chest discomfort went away.  last week has some vision issue. Patient has an appointment today with [facility], [place]. RN informed patient to keep appointment today.

## 2017-08-22 LAB — HEPATIC FUNCTION PANEL
ALBUMIN: 5.1 g/dL (ref 3.5–5.5)
ALT: 27 IU/L (ref 0–44)
AST: 34 IU/L (ref 0–40)
Alkaline Phosphatase: 66 IU/L (ref 39–117)
BILIRUBIN TOTAL: 0.7 mg/dL (ref 0.0–1.2)
Bilirubin, Direct: 0.14 mg/dL (ref 0.00–0.40)
TOTAL PROTEIN: 7.4 g/dL (ref 6.0–8.5)

## 2017-08-22 LAB — BASIC METABOLIC PANEL
BUN/Creatinine Ratio: 16 (ref 9–20)
BUN: 20 mg/dL (ref 6–24)
CALCIUM: 10.3 mg/dL — AB (ref 8.7–10.2)
CHLORIDE: 101 mmol/L (ref 96–106)
CO2: 22 mmol/L (ref 20–29)
Creatinine, Ser: 1.27 mg/dL (ref 0.76–1.27)
GFR calc Af Amer: 75 mL/min/{1.73_m2} (ref >59)
GFR, EST NON AFRICAN AMERICAN: 65 mL/min/{1.73_m2} (ref >59)
Glucose: 106 mg/dL — ABNORMAL HIGH (ref 65–99)
Potassium: 4.7 mmol/L (ref 3.5–5.2)
Sodium: 142 mmol/L (ref 134–144)

## 2017-08-22 LAB — CBC
Hematocrit: 48.4 % (ref 37.5–51.0)
Hemoglobin: 16.6 g/dL (ref 13.0–17.7)
MCH: 30.6 pg (ref 26.6–33.0)
MCHC: 34.3 g/dL (ref 31.5–35.7)
MCV: 89 fL (ref 79–97)
PLATELETS: 236 10*3/uL (ref 150–450)
RBC: 5.42 x10E6/uL (ref 4.14–5.80)
RDW: 13.6 % (ref 12.3–15.4)
WBC: 11.2 10*3/uL — AB (ref 3.4–10.8)

## 2017-08-22 LAB — MAGNESIUM: Magnesium: 2.4 mg/dL — ABNORMAL HIGH (ref 1.6–2.3)

## 2017-08-22 LAB — LIPID PANEL
CHOLESTEROL TOTAL: 224 mg/dL — AB (ref 100–199)
Chol/HDL Ratio: 5.6 ratio — ABNORMAL HIGH (ref 0.0–5.0)
HDL: 40 mg/dL (ref >39)
LDL Calculated: 131 mg/dL — ABNORMAL HIGH (ref 0–99)
TRIGLYCERIDES: 263 mg/dL — AB (ref 0–149)
VLDL Cholesterol Cal: 53 mg/dL — ABNORMAL HIGH (ref 5–40)

## 2017-08-22 LAB — HEMOGLOBIN A1C
Est. average glucose Bld gHb Est-mCnc: 120 mg/dL
Hgb A1c MFr Bld: 5.8 % — ABNORMAL HIGH (ref 4.8–5.6)

## 2017-08-23 MED FILL — predniSONE 10 MG TABS: 10 | 30 days supply | Qty: 30 | Fill #2

## 2017-08-23 MED FILL — SIMVASTATIN 40 MG TABLET: 40 | 90 days supply | Qty: 90 | Fill #0

## 2017-08-24 ENCOUNTER — Telehealth (HOSPITAL_COMMUNITY): Payer: Self-pay

## 2017-08-24 NOTE — Telephone Encounter (Signed)
Encounter complete. 

## 2017-08-25 HISTORY — PX: TRANSTHORACIC ECHOCARDIOGRAM: SHX275

## 2017-08-29 ENCOUNTER — Other Ambulatory Visit: Payer: Self-pay

## 2017-08-29 ENCOUNTER — Ambulatory Visit (HOSPITAL_COMMUNITY): Payer: 59 | Attending: Cardiovascular Disease

## 2017-08-29 ENCOUNTER — Inpatient Hospital Stay (HOSPITAL_COMMUNITY): Admission: RE | Admit: 2017-08-29 | Payer: 59 | Source: Ambulatory Visit

## 2017-08-29 DIAGNOSIS — E785 Hyperlipidemia, unspecified: Secondary | ICD-10-CM | POA: Insufficient documentation

## 2017-08-29 DIAGNOSIS — I252 Old myocardial infarction: Secondary | ICD-10-CM | POA: Insufficient documentation

## 2017-08-29 DIAGNOSIS — R002 Palpitations: Secondary | ICD-10-CM | POA: Diagnosis not present

## 2017-08-29 DIAGNOSIS — I10 Essential (primary) hypertension: Secondary | ICD-10-CM | POA: Insufficient documentation

## 2017-08-29 MED FILL — VIIBRYD 40 MG TABLET: 40 | 30 days supply | Qty: 30 | Fill #2

## 2017-08-31 ENCOUNTER — Other Ambulatory Visit: Payer: Self-pay | Admitting: Internal Medicine

## 2017-08-31 MED ORDER — ADALIMUMAB 80 MG/0.8ML ~~LOC~~ AJKT: 1.0000 "pen " | AUTO-INJECTOR | SUBCUTANEOUS | 12 refills | Status: DC

## 2017-09-03 MED FILL — HUMIRA PEN 40 MG/0.4ML PNKT: 40 | 28 days supply | Qty: 2 | Fill #0

## 2017-09-06 ENCOUNTER — Ambulatory Visit: Payer: 59 | Admitting: Cardiology

## 2017-09-28 MED FILL — HUMIRA PEN 40 MG/0.4ML PNKT: 40 | 28 days supply | Qty: 2 | Fill #1

## 2017-09-28 MED FILL — VIIBRYD 40 MG TABLET: 40 | 30 days supply | Qty: 30 | Fill #3

## 2017-09-28 MED FILL — LIALDA 1.2 GM TABLET SA: 1.2 | 30 days supply | Qty: 120 | Fill #5

## 2017-10-01 ENCOUNTER — Encounter (HOSPITAL_COMMUNITY): Payer: Self-pay | Admitting: Adult Health

## 2017-10-04 ENCOUNTER — Encounter (INDEPENDENT_AMBULATORY_CARE_PROVIDER_SITE_OTHER): Payer: Self-pay | Admitting: Orthopaedic Surgery

## 2017-10-04 ENCOUNTER — Ambulatory Visit (INDEPENDENT_AMBULATORY_CARE_PROVIDER_SITE_OTHER): Payer: 59

## 2017-10-04 ENCOUNTER — Ambulatory Visit (INDEPENDENT_AMBULATORY_CARE_PROVIDER_SITE_OTHER): Payer: 59 | Admitting: Orthopaedic Surgery

## 2017-10-04 DIAGNOSIS — M25531 Pain in right wrist: Secondary | ICD-10-CM | POA: Diagnosis not present

## 2017-10-04 MED ORDER — LIDOCAINE HCL 1 % IJ SOLN
1.0000 mL | INTRAMUSCULAR | Status: AC | PRN
  Administered 2017-10-04: 1 mL

## 2017-10-04 MED ORDER — METHYLPREDNISOLONE ACETATE 40 MG/ML IJ SUSP
40.0000 mg | INTRAMUSCULAR | Status: AC | PRN
  Administered 2017-10-04: 40 mg

## 2017-10-04 NOTE — Progress Notes (Signed)
Office Visit Note   Patient: Daniel Buck           Date of Birth: April 26, 1965           MRN: [id number] Visit Date: 10/04/2017              Requested by: [name], MD [address] [place], [place] [address] PCP: [name], MD   Assessment & Plan: Visit Diagnoses:  1. Pain in right wrist     Plan: I do feel that he would benefit from a steroid injection of the ulnar side of his wrist with getting steroid around the TFCC and in the wrist joint as well as the ulnar collateral ligament area.  He agrees with this and tolerated it well.  I would like to see him back in just 2 weeks because of his continued having some amount of pain I am concerned about a TFCC tear and this would warrant an MRI arthrogram of the wrist.  All question concerns were answered and addressed.  Follow-Up Instructions: Return in about 2 weeks (around 10/18/2017).   Orders:  Orders Placed This Encounter  Procedures  . XR Wrist Complete Right   No orders of the defined types were placed in this encounter.     Procedures: Hand/UE Inj on 10/04/2017 4:34 PM Indications: tendon swelling Details: ulnar approach Medications: 1 mL lidocaine 1 %; 40 mg methylPREDNISolone acetate 40 MG/ML      Clinical Data: No additional findings.   Subjective: Chief Complaint  Patient presents with  . Right Wrist - Pain  The patient comes in today for evaluation treatment of right wrist pain.  He has a history of left carpal tunnel surgery.  He used to get injections on the right side due to carpal tunnel syndrome but he points to the ulnar side of his wrist as source of his pain and denies any numbness and tingling or weakness in the at all.  He does work with painting.  He is not really taking anything anti-inflammatory wise but has had some topical anti-inflammatory try on it.  The extremes of pronation supination which she describes bothers wrist.  Is been wearing a wrist splint at night.   Again he denies any numbness and tingling.  HPI  Review of Systems He currently denies any headache, chest pain, shortness of breath, fever, chills, nausea, vomiting.  Objective: Vital Signs: There were no vitals taken for this visit.  Physical Exam He is alert and oriented x3 and in no acute distress Ortho Exam Examination of his right hand and wrist she has normal grip and pinch strength.  He is neurovascularly intact with palpable pulses in his hand and wrist and good capillary refill in all digits.  He has no numbness and tingling with normal sensation in the radial median and ulnar nerve distributions.  He has significant ulnar-sided wrist pain to palpation over the ulnar styloid and the TFCC area.  The ECU tendon does not subluxate.  Extreme of pronation supination does hurt the ulnar side of his wrist. Specialty Comments:  No specialty comments available.  Imaging: Xr Wrist Complete Right  Result Date: 10/04/2017 3 views of the right wrist show no acute findings.  One view shows just slight widening of the interval between the scaphoid and lunate but this may just be projectional.  There is slight bone spur at the end of the distal ulna.  There is no evidence of fracture.  The lateral view  shows normal anatomic alignment of the carpal bones.    PMFS History: Patient Active Problem List   Diagnosis Date Noted  . Pain in left foot 04/19/2017  . Pain in right foot 04/19/2017  . Pain in left elbow 10/18/2016  . GERD (gastroesophageal reflux disease) 10/04/2016  . Cervical spondylosis 10/04/2016  . Hypercholesteremia 10/04/2016  . Orthostatic dizziness 01/20/2016  . Memory loss 01/19/2016  . Carpal tunnel syndrome, right 12/15/2015  . Carpal tunnel syndrome on right 01/20/2015  . Osteoarthritis of left wrist 01/20/2015  . Heart palpitations 02/06/2014  . Other malaise and fatigue 08/28/2013  . Myocardial infarct, old   . CAD S/P percutaneous coronary angioplasty   .  Hyperlipidemia LDL goal <70   . Essential hypertension, benign 02/02/2011  . Ulcerative colitis ([place]) 02/02/2011   Past Medical History:  Diagnosis Date  . CAD S/P percutaneous coronary angioplasty 1996, 2002   a. s/p Inf MI (age 52) >>PCI to RCA with tandem BMS (PS 1535); b. LHC 06/2000: pRCA 30-40%, prox/mid stents with 75% ISR, 30-40% at crux in RCA >> PCI: Cutting Balloon atherectomy for ISR; c. 2012 MV: inf ischemia, no scar.  . Carpal tunnel syndrome on left 12/2011  . Chronic nasal congestion   . Depression 01/19/2012  . GERD (gastroesophageal reflux disease)   . Gout   . History of Doppler ultrasound    a. Carotid US 3/11: no ICA stenosis   . History of echocardiogram    a. Echo 6/14: mod LVH, EF 50-55%, inf-lat HK, Gr 1 DD, mild LAE  . Hyperlipidemia LDL goal < 70   . Hypertension    under control; has been on med. since 1996  . Myocardial infarct, old 1996   PCI  . Ulcerative colitis ([place])     Family History  Problem Relation Age of Onset  . AAA (abdominal aortic aneurysm) Father        Died from ruptured AAA despite surgery  . CAD Father   . Atrial fibrillation Father   . Aortic stenosis Mother        Status post aVR    Past Surgical History:  Procedure Laterality Date  . CARPAL TUNNEL RELEASE  01/26/2012   Procedure: CARPAL TUNNEL RELEASE;  Surgeon: Cammie Sickle., MD;  Location: [place];  Service: Orthopedics;  Laterality: Left;  . CORONARY ANGIOPLASTY  07/11/2000   Cutting Balloon PTCA for RCA ISR  . CORONARY STENT PLACEMENT  1996   [id number] BMS x 2 in RCA  . CYSTECTOMY  as a child   from neck  . NM MYOVIEW LTD  December 2012   Inferior infarct but no ischemia  . TONSILLECTOMY  as a child  . TRANSTHORACIC ECHOCARDIOGRAM  June 2014   APH: EF 50-50%, moderate concentric LVH. Mild HK of the inferolateral wall. Gr1 DD;    Social History   Occupational History  . Not on file  Tobacco Use  . Smoking status: Former Smoker    Last attempt  to quit: 03/27/1992    Years since quitting: 25.5  . Smokeless tobacco: Current User    Types: Snuff, Chew  Substance and Sexual Activity  . Alcohol use: No  . Drug use: No  . Sexual activity: Not on file

## 2017-10-09 ENCOUNTER — Telehealth (INDEPENDENT_AMBULATORY_CARE_PROVIDER_SITE_OTHER): Payer: Self-pay | Admitting: Orthopaedic Surgery

## 2017-10-09 DIAGNOSIS — M25531 Pain in right wrist: Secondary | ICD-10-CM

## 2017-10-09 NOTE — Telephone Encounter (Signed)
Please advise. Thanks.  

## 2017-10-09 NOTE — Telephone Encounter (Signed)
[name] wife Lenna Sciara) called advised patient's  Wrist is worse. Melissa asked what will Dr Ninfa Linden follow up plan is for the patient.  The number to contact Lenna Sciara is [phone]

## 2017-10-09 NOTE — Telephone Encounter (Signed)
The plan was to see him back 2 weeks after I saw him in the office for repeat exam to see how you are doing.  I would want to order a MRI arthrogram of that wrist to rule out a TFCC tear.

## 2017-10-09 NOTE — Telephone Encounter (Signed)
I tried calling patient to discuss. No answer. LMVM for him advising per Dr Ninfa Linden. Advised on VM to call and let us know if wanted to proceed with scan.

## 2017-10-10 ENCOUNTER — Telehealth (INDEPENDENT_AMBULATORY_CARE_PROVIDER_SITE_OTHER): Payer: Self-pay | Admitting: Orthopaedic Surgery

## 2017-10-10 NOTE — Telephone Encounter (Signed)
Order MRI Arth per below.

## 2017-10-10 NOTE — Telephone Encounter (Signed)
error 

## 2017-10-10 NOTE — Telephone Encounter (Signed)
Ordered

## 2017-10-10 NOTE — Telephone Encounter (Signed)
Patients wife called and states he would like to proceed with MRI, they prefer to go to [facility]. # [phone]

## 2017-10-12 ENCOUNTER — Telehealth: Payer: Self-pay

## 2017-10-12 NOTE — Telephone Encounter (Signed)
   [name]    Request for surgical clearance:  1. What type of surgery is being performed? MR Arthrogram Right Wrist   2. When is this surgery scheduled? TBD   3. What type of clearance is required (medical clearance vs. Pharmacy clearance to hold med vs. Both)? Both  4. Are there any medications that need to be held prior to surgery and how long? Plavix-hold 5 days prior   5. Practice name and name of physician performing surgery? [facility]   6. What is your office phone number [phone]    7.   What is your office fax number [phone]  8.   Anesthesia type (None, local, MAC, general) ? Unkown    Daniel Buck 10/12/2017, 9:35 AM  _________________________________________________________________   (provider comments below)

## 2017-10-12 NOTE — Telephone Encounter (Signed)
Dr. Ellyn Hack pt need MR arthrogram of Rt wrist  Can he hold plavix for 5 days prior and he did have chest pain but went away and he cancelled nuc because he felt well, his Echo was stable.  Low risk prcedure so I was going to clear if ok.

## 2017-10-13 NOTE — Telephone Encounter (Signed)
I have not seen him since 2017.  He is plenty far enough out from PCI. OK to hold plavix 5-7 days   In general, for my patients - if > 1 yr out from PCI - ok to hold Plavix / Brilinta / Effient pre-procedure.  Effient is 7-9 days, & Plavix/Brilinta 5-7 days.  Glenetta Hew

## 2017-10-16 NOTE — Telephone Encounter (Signed)
   Primary Cardiologist: Glenetta Hew, MD  Chart reviewed as part of pre-operative protocol coverage.   Patient needs a low risk procedure (MRI arthrogram).  No further testing needed.  He may proceed at acceptable risk. He may hold Plavix for 5 days prior to the procedure and resume afterward when felt to be safe by the performing provider.  Call back staff:  Please make sure requesting provider received the communication.   This note will be removed from the preop pool.   Richardson Dopp, PA-C 10/16/2017, 4:45 PM

## 2017-10-17 NOTE — Telephone Encounter (Signed)
I tried to reach [facility] to confirm if clearance letter was recv'd. I could not get through to a person on the phone. I will manually fax [phone].

## 2017-10-18 ENCOUNTER — Ambulatory Visit (INDEPENDENT_AMBULATORY_CARE_PROVIDER_SITE_OTHER): Payer: 59 | Admitting: Orthopaedic Surgery

## 2017-10-25 ENCOUNTER — Ambulatory Visit
Admission: RE | Admit: 2017-10-25 | Discharge: 2017-10-25 | Disposition: A | Discharge status: Home / Self Care | Payer: 59 | Source: Ambulatory Visit | Attending: Orthopaedic Surgery | Admitting: Orthopaedic Surgery

## 2017-10-25 DIAGNOSIS — S63591D Other specified sprain of right wrist, subsequent encounter: Secondary | ICD-10-CM | POA: Diagnosis not present

## 2017-10-25 DIAGNOSIS — M25531 Pain in right wrist: Secondary | ICD-10-CM

## 2017-10-25 MED ORDER — IOPAMIDOL (ISOVUE-M 200) INJECTION 41%
1.5000 mL | Freq: Once | INTRAMUSCULAR | Status: AC
  Administered 2017-10-25: 1.5 mL via INTRA_ARTICULAR

## 2017-10-29 ENCOUNTER — Other Ambulatory Visit (INDEPENDENT_AMBULATORY_CARE_PROVIDER_SITE_OTHER): Payer: Self-pay

## 2017-10-29 ENCOUNTER — Ambulatory Visit (INDEPENDENT_AMBULATORY_CARE_PROVIDER_SITE_OTHER): Payer: 59 | Admitting: Physician Assistant

## 2017-10-29 ENCOUNTER — Encounter (INDEPENDENT_AMBULATORY_CARE_PROVIDER_SITE_OTHER): Payer: Self-pay | Admitting: Physician Assistant

## 2017-10-29 DIAGNOSIS — K5190 Ulcerative colitis, unspecified, without complications: Secondary | ICD-10-CM | POA: Diagnosis not present

## 2017-10-29 DIAGNOSIS — S63591D Other specified sprain of right wrist, subsequent encounter: Secondary | ICD-10-CM

## 2017-10-29 NOTE — Progress Notes (Signed)
HPI: Daniel Buck returns today follow-up on his right wrist pain.  He underwent an MRI of the right wrist to rule out TFCC tear.  He states that the injection actually made his pain worse initially and now his pain is better on the lateral aspect of the wrist.  Denies any numbness tingling in the hand.  Does have some pain in the radial side of the wrist.  However if he does any activities i.e. Lifting, extreme pronation supination or ulnar deviation he has pain in the wrist. MRI images are reviewed with the patient of the right wrist shows a perforation of the disc of the triangular fibrocartilage.  Degenerative scapholunate ligament but no tear.  Mild to moderate appearing extensor carpi all naris tendinosis.  Physical exam: Right wrist he is nontender over the volar dorsal aspect of the ulnar side of his wrist.  However with ulnar deviation he does have pain lateral aspect of the wrist.  Hand otherwise neurovascularly intact.  Impression right wrist TFCC tear  Plan: We will refer him to hand surgery for evaluation and treatment of this right TFCC tear.  Questions were encouraged and answered at length.

## 2017-10-30 MED FILL — HUMIRA PEN 40 MG/0.4ML PNKT: 40 | 28 days supply | Qty: 2 | Fill #2

## 2017-10-31 ENCOUNTER — Other Ambulatory Visit: Payer: Self-pay | Admitting: Cardiology

## 2017-10-31 MED FILL — VIIBRYD 40 MG TABLET: 40 | 30 days supply | Qty: 30 | Fill #4

## 2017-11-01 MED FILL — CLOPIDOGREL 75 MG TABLET: 75 | 30 days supply | Qty: 30 | Fill #0

## 2017-11-01 NOTE — Telephone Encounter (Signed)
Rx sent to pharmacy   

## 2017-11-05 DIAGNOSIS — S63501A Unspecified sprain of right wrist, initial encounter: Secondary | ICD-10-CM | POA: Diagnosis not present

## 2017-11-05 DIAGNOSIS — G5601 Carpal tunnel syndrome, right upper limb: Secondary | ICD-10-CM | POA: Diagnosis not present

## 2017-11-05 DIAGNOSIS — M25531 Pain in right wrist: Secondary | ICD-10-CM | POA: Diagnosis not present

## 2017-11-15 ENCOUNTER — Ambulatory Visit: Payer: 59 | Admitting: Cardiology

## 2017-11-27 MED FILL — HUMIRA PEN 40 MG/0.4ML PNKT: 40 | 28 days supply | Qty: 2 | Fill #3

## 2017-11-28 MED FILL — VIIBRYD 40 MG TABLET: 40 | 30 days supply | Qty: 30 | Fill #5

## 2017-12-03 ENCOUNTER — Other Ambulatory Visit: Payer: Self-pay | Admitting: Cardiology

## 2017-12-03 MED FILL — SIMVASTATIN 40 MG TABLET: 40 | 90 days supply | Qty: 90 | Fill #0

## 2017-12-19 DIAGNOSIS — Z0000 Encounter for general adult medical examination without abnormal findings: Secondary | ICD-10-CM | POA: Diagnosis not present

## 2017-12-19 DIAGNOSIS — E7800 Pure hypercholesterolemia, unspecified: Secondary | ICD-10-CM | POA: Diagnosis not present

## 2017-12-19 DIAGNOSIS — Z23 Encounter for immunization: Secondary | ICD-10-CM | POA: Diagnosis not present

## 2017-12-19 DIAGNOSIS — I2510 Atherosclerotic heart disease of native coronary artery without angina pectoris: Secondary | ICD-10-CM | POA: Diagnosis not present

## 2017-12-19 DIAGNOSIS — Z125 Encounter for screening for malignant neoplasm of prostate: Secondary | ICD-10-CM | POA: Diagnosis not present

## 2017-12-19 DIAGNOSIS — I130 Hypertensive heart and chronic kidney disease with heart failure and stage 1 through stage 4 chronic kidney disease, or unspecified chronic kidney disease: Secondary | ICD-10-CM | POA: Diagnosis not present

## 2017-12-19 DIAGNOSIS — N183 Chronic kidney disease, stage 3 (moderate): Secondary | ICD-10-CM | POA: Diagnosis not present

## 2017-12-19 DIAGNOSIS — K219 Gastro-esophageal reflux disease without esophagitis: Secondary | ICD-10-CM | POA: Diagnosis not present

## 2017-12-19 DIAGNOSIS — K5190 Ulcerative colitis, unspecified, without complications: Secondary | ICD-10-CM | POA: Diagnosis not present

## 2017-12-19 DIAGNOSIS — R7301 Impaired fasting glucose: Secondary | ICD-10-CM | POA: Diagnosis not present

## 2017-12-19 DIAGNOSIS — F411 Generalized anxiety disorder: Secondary | ICD-10-CM | POA: Diagnosis not present

## 2017-12-24 ENCOUNTER — Other Ambulatory Visit: Payer: Self-pay | Admitting: Cardiology

## 2017-12-24 MED FILL — CLOPIDOGREL 75 MG TABLET: 75 | 30 days supply | Qty: 30 | Fill #0

## 2017-12-25 MED FILL — HUMIRA PEN 40 MG/0.4ML PNKT: 40 | 28 days supply | Qty: 2 | Fill #4

## 2017-12-31 MED FILL — VIIBRYD 40 MG TABLET: 40 | 90 days supply | Qty: 90 | Fill #0

## 2018-01-02 ENCOUNTER — Encounter: Payer: Self-pay | Admitting: Cardiology

## 2018-01-02 ENCOUNTER — Ambulatory Visit: Payer: 59 | Admitting: Cardiology

## 2018-01-02 VITALS — BP 124/86 | HR 67 | Ht 71.0 in | Wt 200.0 lb

## 2018-01-02 DIAGNOSIS — Z79899 Other long term (current) drug therapy: Secondary | ICD-10-CM | POA: Diagnosis not present

## 2018-01-02 DIAGNOSIS — Z9861 Coronary angioplasty status: Secondary | ICD-10-CM | POA: Diagnosis not present

## 2018-01-02 DIAGNOSIS — E785 Hyperlipidemia, unspecified: Secondary | ICD-10-CM | POA: Diagnosis not present

## 2018-01-02 DIAGNOSIS — R002 Palpitations: Secondary | ICD-10-CM

## 2018-01-02 DIAGNOSIS — I2510 Atherosclerotic heart disease of native coronary artery without angina pectoris: Secondary | ICD-10-CM

## 2018-01-02 DIAGNOSIS — I10 Essential (primary) hypertension: Secondary | ICD-10-CM | POA: Diagnosis not present

## 2018-01-02 MED ORDER — ROSUVASTATIN CALCIUM 40 MG PO TABS: 40.0000 mg | ORAL_TABLET | Freq: Every day | ORAL | 3 refills | Status: DC

## 2018-01-02 MED FILL — ROSUVASTATIN CALCIUM 40 MG: 40 | 90 days supply | Qty: 90 | Fill #0

## 2018-01-02 NOTE — Patient Instructions (Signed)
Medication Instructions:  Your Physician recommend you make the following changes to your medication. -Stop: Zocor 40 mg -Start: Crestor 40 mg If you need a refill on your cardiac medications before your next appointment, please call your pharmacy.   Lab work: Your physician recommends that you return for lab work in March, 2020 ( Lipid)  If you have labs (blood work) drawn today and your tests are completely normal, you will receive your results only by: [name] MyChart Message (if you have MyChart) OR . A paper copy in the mail If you have any lab test that is abnormal or we need to change your treatment, we will call you to review the results.  Testing/Procedures: None  Follow-Up: At [facility], you and your health needs are our priority.  As part of our continuing mission to provide you with exceptional heart care, we have created designated Provider Care Teams.  These Care Teams include your primary Cardiologist (physician) and Advanced Practice Providers (APPs -  Physician Assistants and Nurse Practitioners) who all work together to provide you with the care you need, when you need it. You will need a follow up appointment in 1 years.  Please call our office 2 months in advance to schedule this appointment.  You may see Glenetta Hew, MD or one of the following Advanced Practice Providers on your designated Care Team:   Rosaria Ferries, PA-C . Jory Sims, DNP, ANP  Any Other Special Instructions Will Be Listed Below (If Applicable).

## 2018-01-02 NOTE — Progress Notes (Signed)
PCP: [name], MD  Clinic Note: Chief Complaint  Patient presents with  . Follow-up    Feeling well.  No issues  . Coronary Artery Disease  . Palpitations    Stable.  Not enough to restart medicine    HPI: Daniel Buck is a 52 y.o. male with a PMH below who presents today for ~5 month f/u for CAD-PCI.  I last saw him in Oct 2017 -no complaints.  He had stopped his Avapro and atenolol because of dizziness and fatigue.  Only a few palpitations when stopping atenolol.  No angina  Galileo C Rumer was seen in May by Jory Sims, DNP --> had an episode of substernal chest discomfort there was a raw feeling in his chest night.  This is shortly after starting Humira and completing his prednisone taper.  He noted that he had been having quite a bit of urination and after urinating the chest pressure got better.  A Myoview stress test was ordered, but not followed through on.  Recent Hospitalizations: None  Studies Personally Reviewed - (if available, images/films reviewed: From Epic Chart or Care Everywhere)  2D Echo June 2019: Normal echo-normal LV size and function.  EF 55-60%.  No R WMA.  No ASD/PFO.  No valve lesions.  Interval History: He presents back today feeling fine.  He has not had any further episodes of chest discomfort.  About the only thing he notes is that he still has intermittent palpitations at last off and on short little bursts they can come and go every 5 or 10 minutes and then will be gone for several days.  This really does not bother him enough to really want to take another medicine however.  He does not feel lightheaded or dizzy.  No syncope or near syncope.  Remainder of Cardiac Review of Symptoms: No chest pain or shortness of breath with rest or exertion.  No PND, orthopnea or edema.  No TIA/amaurosis fugax symptoms. No melena, hematochezia, hematuria, or epstaxis. No claudication.  He just started taking Humira for his ulcerative colitis and is no  longer on prednisone.  He is not sure how this interacts with with a statin.  We discussed his LDL level going up.  ROS: A comprehensive was performed. Review of Systems  Constitutional: Negative for malaise/fatigue.  HENT: Negative for congestion and nosebleeds.   Respiratory: Negative for shortness of breath and wheezing.   Cardiovascular: Positive for palpitations.  Gastrointestinal: Negative for abdominal pain, blood in stool, heartburn and melena.  Genitourinary: Positive for frequency (Related to prostate). Negative for hematuria.  Musculoskeletal: Negative for back pain, falls and joint pain.  Neurological: Negative for dizziness, focal weakness and weakness.  Psychiatric/Behavioral: Negative for depression and memory loss. The patient is not nervous/anxious and does not have insomnia.   All other systems reviewed and are negative.  I have reviewed and (if needed) personally updated the patient's problem list, medications, allergies, past medical and surgical history, social and family history.   Past Medical History:  Diagnosis Date  . CAD S/P percutaneous coronary angioplasty 1996, 2002   a. s/p Inf MI (age 75) >>PCI to RCA with tandem BMS (PS 1535); b. LHC 06/2000: pRCA 30-40%, prox/mid stents with 75% ISR, 30-40% at crux in RCA >> PCI: Cutting Balloon atherectomy for ISR; c. 2012 MV: inf ischemia, no scar.  . Carpal tunnel syndrome on left 12/2011  . Chronic nasal congestion   . Depression 01/19/2012  . GERD (gastroesophageal reflux disease)   .  Gout   . History of Doppler ultrasound    a. Carotid US 3/11: no ICA stenosis   . History of echocardiogram    a. Echo 6/14: mod LVH, EF 50-55%, inf-lat HK, Gr 1 DD, mild LAE  . Hyperlipidemia LDL goal < 70   . Hypertension    under control; has been on med. since 1996  . Myocardial infarct, old 1996   PCI  . Ulcerative colitis [facility])     Past Surgical History:  Procedure Laterality Date  . CARPAL TUNNEL RELEASE  01/26/2012    Procedure: CARPAL TUNNEL RELEASE;  Surgeon: Cammie Sickle., MD;  Location: [place];  Service: Orthopedics;  Laterality: Left;  . CORONARY ANGIOPLASTY  07/11/2000   Cutting Balloon PTCA for RCA ISR  . CORONARY STENT PLACEMENT  1996   [id number] BMS x 2 in RCA  . CYSTECTOMY  as a child   from neck  . NM MYOVIEW LTD  December 2012   Inferior infarct but no ischemia  . TONSILLECTOMY  as a child  . TRANSTHORACIC ECHOCARDIOGRAM  08/2017   Normal echo-normal LV size and function.  EF 55-60%.  No R WMA.  No ASD/PFO.  No valve lesions.    Current Meds  Medication Sig  . acetaminophen (TYLENOL) 500 MG tablet Take 1,000 mg by mouth every 4 (four) hours as needed for mild pain or moderate pain.   . Adalimumab (HUMIRA PEN-CD/UC/HS STARTER) 80 MG/0.8ML PNKT Inject 1 pen into the skin as directed. Inject 40 mg under skin every 2 weeks.  . clopidogrel (PLAVIX) 75 MG tablet TAKE 1 TABLET BY MOUTH DAILY.  [name] esomeprazole (NEXIUM) 40 MG capsule Take 40 mg by mouth daily.   [name] LIALDA 1.2 g EC tablet Take 1.2 g by mouth daily with breakfast.   . nitroGLYCERIN (NITROSTAT) 0.4 MG SL tablet Place 1 tablet (0.4 mg total) under the tongue every 5 (five) minutes as needed for chest pain.  . Vilazodone HCl (VIIBRYD) 40 MG TABS Take 40 mg by mouth daily.  . [DISCONTINUED] simvastatin (ZOCOR) 40 MG tablet TAKE 1 TABLET BY MOUTH AT BEDTIME.    No Known Allergies  Social History   Tobacco Use  . Smoking status: Former Smoker    Last attempt to quit: 03/27/1992    Years since quitting: 25.7  . Smokeless tobacco: Current User    Types: Snuff, Chew  Substance Use Topics  . Alcohol use: No  . Drug use: No   Social History   Social History Narrative   He lives in [place], [place] near [place] with his wife. [place]. He is extremely active around the farm, but does not have a routine exercise regimen.   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   He quit smoking in 1994.     family history includes AAA (abdominal aortic aneurysm) in his father; Aortic stenosis in his mother; Atrial fibrillation in his father; CAD in his father.  Wt Readings from Last 3 Encounters:  01/02/18 200 lb (90.7 kg)  08/21/17 201 lb 6.4 oz (91.4 kg)  09/29/16 196 lb (88.9 kg)    PHYSICAL EXAM BP 124/86   Pulse 67   Ht 5' 11"  (1.803 m)   Wt 200 lb (90.7 kg)   SpO2 97%   BMI 27.89 kg/m  Physical Exam  Constitutional: He is oriented to person, place, and time. He appears well-developed and well-nourished. No distress.  Well-groomed.  Healthy-appearing  HENT:  Head: Normocephalic and atraumatic.  Neck: Normal range of motion. Neck supple. No hepatojugular reflux and no JVD present. Carotid bruit is not present.  Cardiovascular: Normal rate, regular rhythm, normal heart sounds, intact distal pulses and normal pulses.  No extrasystoles are present. PMI is not displaced. Exam reveals no gallop and no friction rub.  No murmur heard. Pulmonary/Chest: Effort normal and breath sounds normal. No respiratory distress. He has no wheezes. He has no rales.  Abdominal: Soft. Bowel sounds are normal. He exhibits no distension. There is no tenderness. There is no rebound.  No HSM  Musculoskeletal: Normal range of motion. He exhibits no edema.  Neurological: He is alert and oriented to person, place, and time.  Psychiatric: He has a normal mood and affect. His behavior is normal. Judgment and thought content normal.  Vitals reviewed.   Adult ECG Report Not checked  Other studies Reviewed: Additional studies/ records that were reviewed today include:  Recent Labs:   Lab Results  Component Value Date   CHOL 224 (H) 08/21/2017   HDL 40 08/21/2017   LDLCALC 131 (H) 08/21/2017   TRIG 263 (H) 08/21/2017   CHOLHDL 5.6 (H) 08/21/2017   From K PN 12/19/2017: TC 168, TG 108, LDL 114, HDL 33.  A1c 5.7.  BUN/creatinine 20/1.35.  TSH 2.65.   ASSESSMENT / PLAN: Problem List Items Addressed  This Visit    CAD S/P percutaneous coronary angioplasty - Primary (Chronic)    Distant history of PCI with 2 bare-metal stents to the RCA back in 96 with a time of an MI.  Had in-stent restenosis in 2002 requiring PTCA.  Since then he has not really had any cardiac issues. Nonischemic Myoview in 2012 and recent echo normal with no regional wall motion abnormalities.  No longer on beta-blocker or ACE inhibitor now because of dizziness and orthostatic symptoms/fatigue. Remains on Plavix without aspirin more for maintenance.  He had more GI issues with aspirin them with Plavix. On statin which we will convert to rosuvastatin 40 mg. Continues to be active working out with weights daily and is very active at his job as well.        Relevant Medications   rosuvastatin (CRESTOR) 40 MG tablet   Essential hypertension, benign (Chronic)    Really not hypertensive anymore.  He is not on any medicines, was relatively intolerant of medicines that he was on the past.  We have stopped his ACE inhibitor and beta-blocker and he feels better.  Palpitations are not worrisome enough for him to start a beta-blocker.      Relevant Medications   rosuvastatin (CRESTOR) 40 MG tablet   Heart palpitations (Chronic)    Present.  He has not thought about taking as needed atenolol.  Just prefers to not be on medicines.  If they are not worrisome to him, we will not treat.      Hyperlipidemia LDL goal <70 (Chronic)    Interestingly, his LDL did look better in September compared to May.  This may have been related to changing his meds for his ulcerative colitis.  Plan: Convert from simvastatin 40 mg to rosuvastatin 40 mg.  Will be due for lipid check in March.      Relevant Medications   rosuvastatin (CRESTOR) 40 MG tablet    Other Visit Diagnoses    Medication management       Relevant Orders   Lipid panel     Current medicines are reviewed at length with the  patient today.  (+/- concerns) questions if  Humira may have an interaction with statin The following changes have been made:  See below  Patient Instructions  Medication Instructions:  Your Physician recommend you make the following changes to your medication. -Stop: Zocor 40 mg -Start: Crestor 40 mg If you need a refill on your cardiac medications before your next appointment, please call your pharmacy.   Lab work: Your physician recommends that you return for lab work in March, 2020 ( Lipid)  If you have labs (blood work) drawn today and your tests are completely normal, you will receive your results only by: [name] MyChart Message (if you have MyChart) OR . A paper copy in the mail If you have any lab test that is abnormal or we need to change your treatment, we will call you to review the results.  Testing/Procedures: None  Follow-Up: At [facility], you and your health needs are our priority.  As part of our continuing mission to provide you with exceptional heart care, we have created designated Provider Care Teams.  These Care Teams include your primary Cardiologist (physician) and Advanced Practice Providers (APPs -  Physician Assistants and Nurse Practitioners) who all work together to provide you with the care you need, when you need it. You will need a follow up appointment in 1 years.  Please call our office 2 months in advance to schedule this appointment.  You may see Glenetta Hew, MD or one of the following Advanced Practice Providers on your designated Care Team:   Rosaria Ferries, PA-C . Jory Sims, DNP, ANP  Any Other Special Instructions Will Be Listed Below (If Applicable).    Studies Ordered:   Orders Placed This Encounter  Procedures  . Lipid panel      Glenetta Hew, M.D., M.S. Interventional Cardiologist   Pager # [phone] Phone # [phone] [address] [place], [place] [address]   Thank you for choosing Heartcare at [facility]!!

## 2018-01-03 ENCOUNTER — Encounter: Payer: Self-pay | Admitting: Cardiology

## 2018-01-03 NOTE — Assessment & Plan Note (Signed)
Interestingly, his LDL did look better in September compared to May.  This may have been related to changing his meds for his ulcerative colitis.  Plan: Convert from simvastatin 40 mg to rosuvastatin 40 mg.  Will be due for lipid check in March.

## 2018-01-03 NOTE — Assessment & Plan Note (Signed)
Present.  He has not thought about taking as needed atenolol.  Just prefers to not be on medicines.  If they are not worrisome to him, we will not treat.

## 2018-01-03 NOTE — Assessment & Plan Note (Signed)
Distant history of PCI with 2 bare-metal stents to the RCA back in 96 with a time of an MI.  Had in-stent restenosis in 2002 requiring PTCA.  Since then he has not really had any cardiac issues. Nonischemic Myoview in 2012 and recent echo normal with no regional wall motion abnormalities.  No longer on beta-blocker or ACE inhibitor now because of dizziness and orthostatic symptoms/fatigue. Remains on Plavix without aspirin more for maintenance.  He had more GI issues with aspirin them with Plavix. On statin which we will convert to rosuvastatin 40 mg. Continues to be active working out with weights daily and is very active at his job as well.

## 2018-01-03 NOTE — Assessment & Plan Note (Signed)
Really not hypertensive anymore.  He is not on any medicines, was relatively intolerant of medicines that he was on the past.  We have stopped his ACE inhibitor and beta-blocker and he feels better.  Palpitations are not worrisome enough for him to start a beta-blocker.

## 2018-01-04 ENCOUNTER — Telehealth: Payer: Self-pay | Admitting: Cardiology

## 2018-01-04 ENCOUNTER — Emergency Department (HOSPITAL_COMMUNITY): Payer: 59

## 2018-01-04 ENCOUNTER — Encounter (HOSPITAL_COMMUNITY): Payer: Self-pay

## 2018-01-04 ENCOUNTER — Observation Stay (HOSPITAL_COMMUNITY)
Admission: EM | Admit: 2018-01-04 | Discharge: 2018-01-04 | Discharge status: Against Medical Advice | Payer: 59 | Attending: Internal Medicine | Admitting: Internal Medicine

## 2018-01-04 DIAGNOSIS — Z955 Presence of coronary angioplasty implant and graft: Secondary | ICD-10-CM | POA: Diagnosis not present

## 2018-01-04 DIAGNOSIS — E785 Hyperlipidemia, unspecified: Secondary | ICD-10-CM | POA: Diagnosis not present

## 2018-01-04 DIAGNOSIS — K219 Gastro-esophageal reflux disease without esophagitis: Secondary | ICD-10-CM | POA: Insufficient documentation

## 2018-01-04 DIAGNOSIS — F329 Major depressive disorder, single episode, unspecified: Secondary | ICD-10-CM | POA: Insufficient documentation

## 2018-01-04 DIAGNOSIS — N182 Chronic kidney disease, stage 2 (mild): Secondary | ICD-10-CM | POA: Diagnosis not present

## 2018-01-04 DIAGNOSIS — R079 Chest pain, unspecified: Secondary | ICD-10-CM

## 2018-01-04 DIAGNOSIS — I129 Hypertensive chronic kidney disease with stage 1 through stage 4 chronic kidney disease, or unspecified chronic kidney disease: Secondary | ICD-10-CM | POA: Diagnosis not present

## 2018-01-04 DIAGNOSIS — Z79899 Other long term (current) drug therapy: Secondary | ICD-10-CM | POA: Diagnosis not present

## 2018-01-04 DIAGNOSIS — M109 Gout, unspecified: Secondary | ICD-10-CM | POA: Diagnosis not present

## 2018-01-04 DIAGNOSIS — Z87891 Personal history of nicotine dependence: Secondary | ICD-10-CM | POA: Diagnosis not present

## 2018-01-04 DIAGNOSIS — R0789 Other chest pain: Secondary | ICD-10-CM | POA: Diagnosis not present

## 2018-01-04 DIAGNOSIS — I252 Old myocardial infarction: Secondary | ICD-10-CM | POA: Diagnosis not present

## 2018-01-04 DIAGNOSIS — I2510 Atherosclerotic heart disease of native coronary artery without angina pectoris: Secondary | ICD-10-CM | POA: Diagnosis not present

## 2018-01-04 LAB — BASIC METABOLIC PANEL
Anion gap: 8 (ref 5–15)
BUN: 23 mg/dL — ABNORMAL HIGH (ref 6–20)
CO2: 26 mmol/L (ref 22–32)
Calcium: 9.9 mg/dL (ref 8.9–10.3)
Chloride: 105 mmol/L (ref 98–111)
Creatinine, Ser: 1.37 mg/dL — ABNORMAL HIGH (ref 0.61–1.24)
GFR calc Af Amer: >60 mL/min (ref >60)
GFR calc non Af Amer: 58 mL/min — ABNORMAL LOW (ref >60)
Glucose, Bld: 86 mg/dL (ref 70–99)
Potassium: 4 mmol/L (ref 3.5–5.1)
Sodium: 139 mmol/L (ref 135–145)

## 2018-01-04 LAB — CBC
HCT: 47 % (ref 39.0–52.0)
Hemoglobin: 15.2 g/dL (ref 13.0–17.0)
MCH: 29 pg (ref 26.0–34.0)
MCHC: 32.3 g/dL (ref 30.0–36.0)
MCV: 89.7 fL (ref 80.0–100.0)
Platelets: 247 10*3/uL (ref 150–400)
RBC: 5.24 MIL/uL (ref 4.22–5.81)
RDW: 12.2 % (ref 11.5–15.5)
WBC: 9.9 10*3/uL (ref 4.0–10.5)
nRBC: 0 % (ref 0.0–0.2)

## 2018-01-04 LAB — I-STAT TROPONIN, ED: Troponin i, poc: 0.01 ng/mL (ref 0.00–0.08)

## 2018-01-04 MED ORDER — ASPIRIN 81 MG PO CHEW
324.0000 mg | CHEWABLE_TABLET | Freq: Once | ORAL | Status: AC
  Administered 2018-01-04: 324 mg via ORAL
  Filled 2018-01-04: qty 4

## 2018-01-04 NOTE — ED Triage Notes (Signed)
Pt presents for evaluation of chest pain starting yesterday and worsening today. Reports feels like heartburn. Tingling to L side of face and neck. MI at age 52.

## 2018-01-04 NOTE — ED Notes (Signed)
Pt requesting to leave at this time. States he can call his MD and schedule a stress test on Monday. Admitting MD paged at this time.

## 2018-01-04 NOTE — Telephone Encounter (Signed)
Spoke with pt. Pt sts that he has been having this burning in his chest for 2 days. It has not been relieved by Tums. Nothing relieves the discomfort, he denies sob, diaphoresis, n/v. This morning he developed numbness and tingling on the left side of his face and down his arm.  Adv pt to call 911 to transported to the ED now. Pt sts that he does not want to go by ambulance.  Pt sts that his adult son is home with him, he will have him drive him to [facility] ED now for further evaluation.

## 2018-01-04 NOTE — ED Notes (Signed)
Patient ambulatory to bathroom with steady gait at this time 

## 2018-01-04 NOTE — Telephone Encounter (Signed)
Pt c/o of Chest Pain: STAT if CP now or developed within 24 hours  1. Are you having CP right now?  Having some chest pain now-  also had heart burning this week 2. Are you experiencing any other symptoms (ex. SOB, nausea, vomiting, sweating)? Face tingling and left arm feel like it is numb  3. How long have you been experiencing CP? This morning  4. Is your CP continuous or coming and going? Not sure  5. Have you taken Nitroglycerin? no ?

## 2018-01-04 NOTE — H&P (Signed)
History and Physical  [facility] WUJ:[id number] DOB: Aug 18, 1965 DOA: 01/04/2018  Referring physician: ER physician PCP: [name], MD  Outpatient Specialists: Cardiologist. Patient coming from: Home  Chief Complaint: Chest pain  HPI: Patient is a 52 year old Caucasian male with past medical history significant for coronary artery disease status post PCI several years ago.  Patient presents with 2-day history of epigastric and suppressed sternal pain, as well as intermittent sub-sternal chest pain.  Patient is known to have a history of GERD.  Pain and discomfort intermittent, none radiating, not associated with nausea, vomiting, shortness of breath or diaphoresis.  EKG is nonrevealing.  Point-of-care troponin is 0.0.  Patient is currently chest pain free.  ER provider has called the hospitalist to admit patient for chest pain based on prior history of coronary artery disease and PCI.  No headache, no neck pain, no fever or chills, no shortness of breath, no GI symptoms and no urinary symptoms.  ED Course: Negative troponins so far.  EKG is nonrevealing. Pertinent labs: Sodium is 139, potassium of 4, chloride 105, CO2 26, BUN of 23 with serum creatinine of 1.37 and blood sugar of 86.  Point-of-care troponin was 0.01.  CBC reveals WBC of 9.9, hemoglobin 15.2, hematocrit of 47, MCV of 89.7 with platelet count of 247.  EKG reveals normal sinus rhythm. EKG: Independently reviewed.  Imaging: independently reviewed.  Chest x-ray has not revealed any acute cardiopulmonary process.  Review of Systems:  Negative for fever, visual changes, sore throat, rash, new muscle aches, SOB, dysuria, bleeding, n/v/abdominal pain.  Past Medical History:  Diagnosis Date  . CAD S/P percutaneous coronary angioplasty 1996, 2002   a. s/p Inf MI (age 43) >>PCI to RCA with tandem BMS (PS 1535); b. LHC 06/2000: pRCA 30-40%, prox/mid stents with 75% ISR, 30-40% at crux in RCA >> PCI: Cutting Balloon atherectomy for  ISR; c. 2012 MV: inf ischemia, no scar.  . Carpal tunnel syndrome on left 12/2011  . Chronic nasal congestion   . Depression 01/19/2012  . GERD (gastroesophageal reflux disease)   . Gout   . History of Doppler ultrasound    a. Carotid US 3/11: no ICA stenosis   . History of echocardiogram    a. Echo 6/14: mod LVH, EF 50-55%, inf-lat HK, Gr 1 DD, mild LAE  . Hyperlipidemia LDL goal < 70   . Hypertension    under control; has been on med. since 1996  . Myocardial infarct, old 1996   PCI  . Ulcerative colitis [facility])     Past Surgical History:  Procedure Laterality Date  . CARPAL TUNNEL RELEASE  01/26/2012   Procedure: CARPAL TUNNEL RELEASE;  Surgeon: Cammie Sickle., MD;  Location: [place];  Service: Orthopedics;  Laterality: Left;  . CORONARY ANGIOPLASTY  07/11/2000   Cutting Balloon PTCA for RCA ISR  . CORONARY STENT PLACEMENT  1996   [id number] BMS x 2 in RCA  . CYSTECTOMY  as a child   from neck  . NM MYOVIEW LTD  December 2012   Inferior infarct but no ischemia  . TONSILLECTOMY  as a child  . TRANSTHORACIC ECHOCARDIOGRAM  08/2017   Normal echo-normal LV size and function.  EF 55-60%.  No R WMA.  No ASD/PFO.  No valve lesions.     reports that he quit smoking about 25 years ago. His smokeless tobacco use includes snuff and chew. He reports that he does not drink alcohol or use drugs.  No  Known Allergies  Family History  Problem Relation Age of Onset  . AAA (abdominal aortic aneurysm) Father        Died from ruptured AAA despite surgery  . CAD Father   . Atrial fibrillation Father   . Aortic stenosis Mother        Status post aVR     Prior to Admission medications   Medication Sig Start Date End Date Taking? Authorizing Provider  acetaminophen (TYLENOL) 500 MG tablet Take 1,000 mg by mouth every 4 (four) hours as needed for mild pain or moderate pain.    Yes [provider]  Adalimumab (HUMIRA PEN-CD/UC/HS STARTER) 80 MG/0.8ML PNKT Inject 1  pen into the skin as directed. Inject 40 mg under skin every 2 weeks. 08/31/17  Yes [name], MD  calcium carbonate (TUMS EX) 750 MG chewable tablet Chew 2 tablets by mouth as needed for heartburn.   Yes [provider]  clopidogrel (PLAVIX) 75 MG tablet TAKE 1 TABLET BY MOUTH DAILY. 12/24/17  Yes [name], MD  esomeprazole (NEXIUM) 40 MG capsule Take 40 mg by mouth daily.    Yes [provider]  LIALDA 1.2 g EC tablet Take 1.2 g by mouth daily with breakfast.  07/21/16  Yes [provider]  nitroGLYCERIN (NITROSTAT) 0.4 MG SL tablet Place 1 tablet (0.4 mg total) under the tongue every 5 (five) minutes as needed for chest pain. 08/21/17 01/04/18 Yes [name], NP  Omega-3 Fatty Acids (FISH OIL) 1000 MG CAPS Take 1,000 mg by mouth daily.   Yes [provider]  rosuvastatin (CRESTOR) 40 MG tablet Take 1 tablet (40 mg total) by mouth daily. 01/02/18 04/02/18 Yes [name], MD  Vilazodone HCl (VIIBRYD) 40 MG TABS Take 40 mg by mouth daily.   Yes [provider]    Physical Exam: Vitals:   01/04/18 1345 01/04/18 1400 01/04/18 1415 01/04/18 1500  BP: (!) 133/91 (!) 134/96 (!) 138/92 (!) 139/96  Pulse: (!) 58 62 61 60  Resp: 17 18 (!) 22 17  Temp:      TempSrc:      SpO2: 97% 97% 97% 100%    Constitutional:  . Appears calm and comfortable Eyes:  . No pallor. No jaundice.  ENMT:  . external ears, nose appear normal Neck:  . Neck is supple. No JVD Respiratory:  . CTA bilaterally, no w/r/r.  . Respiratory effort normal. No retractions or accessory muscle use Cardiovascular:  . S1S2 . No LE extremity edema   Abdomen:  . Abdomen is soft and non tender. Organs are difficult to assess. Neurologic:  . Awake and alert. . Moves all limbs.  Wt Readings from Last 3 Encounters:  01/02/18 90.7 kg  08/21/17 91.4 kg  09/29/16 88.9 kg    I have personally reviewed following labs and imaging studies  Labs on  Admission:  CBC: Recent Labs  Lab 01/04/18 1453  WBC 9.9  HGB 15.2  HCT 47.0  MCV 89.7  PLT 341   Basic Metabolic Panel: Recent Labs  Lab 01/04/18 1453  NA 139  K 4.0  CL 105  CO2 26  GLUCOSE 86  BUN 23*  CREATININE 1.37*  CALCIUM 9.9   Liver Function Tests: No results for input(s): AST, ALT, ALKPHOS, BILITOT, PROT, ALBUMIN in the last 168 hours. No results for input(s): LIPASE, AMYLASE in the last 168 hours. No results for input(s): AMMONIA in the last 168 hours. Coagulation Profile: No results for  input(s): INR, PROTIME in the last 168 hours. Cardiac Enzymes: No results for input(s): CKTOTAL, CKMB, CKMBINDEX, TROPONINI in the last 168 hours. BNP (last 3 results) No results for input(s): PROBNP in the last 8760 hours. HbA1C: No results for input(s): HGBA1C in the last 72 hours. CBG: No results for input(s): GLUCAP in the last 168 hours. Lipid Profile: No results for input(s): CHOL, HDL, LDLCALC, TRIG, CHOLHDL, LDLDIRECT in the last 72 hours. Thyroid Function Tests: No results for input(s): TSH, T4TOTAL, FREET4, T3FREE, THYROIDAB in the last 72 hours. Anemia Panel: No results for input(s): VITAMINB12, FOLATE, FERRITIN, TIBC, IRON, RETICCTPCT in the last 72 hours. Urine analysis: No results found for: COLORURINE, APPEARANCEUR, LABSPEC, PHURINE, GLUCOSEU, HGBUR, BILIRUBINUR, KETONESUR, PROTEINUR, UROBILINOGEN, NITRITE, LEUKOCYTESUR Sepsis Labs: @LABRCNTIP (procalcitonin:4,lacticidven:4) )No results found for this or any previous visit (from the past 240 hour(s)).    Radiological Exams on Admission: Dg Chest 2 View  Result Date: 01/04/2018 CLINICAL DATA:  Chest pain EXAM: CHEST - 2 VIEW COMPARISON:  02/05/2011 FINDINGS: The heart size and mediastinal contours are within normal limits. Both lungs are clear. The visualized skeletal structures are unremarkable. IMPRESSION: No active cardiopulmonary disease. Electronically Signed   By: Franchot Gallo M.D.   On:  01/04/2018 12:39    EKG: Independently reviewed.   Active Problems:   * No active hospital problems. *   Assessment/Plan Chest pain, seems atypical: Cycle cardiac enzymes Prior history of coronary artery disease status post PCI noted Blood pressure to consider GI consult if cardiac work-up is nonrevealing. Patient is known to a local cardiologist, currently have a low threshold to consult cardiology.  History of coronary artery disease status post PCI: Kindly see above.  History of GERD: Continue PPI.  Chronic kidney disease stage II/III: Stable. Continue to monitor.  History of depression: Continue current meds.  Management will depend on hospital course.  DVT prophylaxis: Subcutaneous Lovenox Code Status: Full code Family Communication: Wife Disposition Plan: Home eventually Consults called: None Admission status: Admission  Time spent: 60 minutes minutes  Dana Allan, MD  [facility] Pager #: [phone] 7PM-7AM contact night coverage as above  01/04/2018, 4:42 PM

## 2018-01-04 NOTE — ED Provider Notes (Signed)
[facility] EM[facility] DE[facility]MENT Provider Note   CSN: [id number] Arrival date & time: 01/04/18  1149     History   Chief Complaint Chief Complaint  Patient presents with  . Chest Pain    HPI Daniel [facility] is a 52 y.o. male.  HPI   52 year old male with chest pain.  Intermittent for the past 2 days.  Symptoms come and go without appreciable exacerbating relieving factors.  Describes a pressure-like sensation in his lower sternum.  Also "heartburn" in his throat.  Numbness in his face and also his left arm.  No dyspnea or diaphoresis.  He does have a history of CAD.  Reports similar symptoms with prior MI.  Currently asymptomatic.  Reports compliance with his medications.  Past Medical History:  Diagnosis Date  . CAD S/P percutaneous coronary angioplasty 1996, 2002   a. s/p Inf MI (age 67) >>PCI to RCA with tandem BMS (PS 1535); b. LHC 06/2000: pRCA 30-40%, prox/mid stents with 75% ISR, 30-40% at crux in RCA >> PCI: Cutting Balloon atherectomy for ISR; c. 2012 MV: inf ischemia, no scar.  . Carpal tunnel syndrome on left 12/2011  . Chronic nasal congestion   . Depression 01/19/2012  . GERD (gastroesophageal reflux disease)   . Gout   . History of Doppler ultrasound    a. Carotid US 3/11: no ICA stenosis   . History of echocardiogram    a. Echo 6/14: mod LVH, EF 50-55%, inf-lat HK, Gr 1 DD, mild LAE  . Hyperlipidemia LDL goal < 70   . Hypertension    under control; has been on med. since 1996  . Myocardial infarct, old 1996   PCI  . Ulcerative colitis [facility])     Patient Active Problem List   Diagnosis Date Noted  . GERD (gastroesophageal reflux disease) 10/04/2016  . Cervical spondylosis 10/04/2016  . Orthostatic dizziness 01/20/2016  . Memory loss 01/19/2016  . Carpal tunnel syndrome, right 12/15/2015  . Carpal tunnel syndrome on right 01/20/2015  . Osteoarthritis of left wrist 01/20/2015  . Heart palpitations 02/06/2014  . Myocardial infarct, old     . CAD S/P percutaneous coronary angioplasty   . Hyperlipidemia LDL goal <70   . Essential hypertension, benign 02/02/2011  . Ulcerative colitis ([place]) 02/02/2011    Past Surgical History:  Procedure Laterality Date  . CARPAL TUNNEL RELEASE  01/26/2012   Procedure: CARPAL TUNNEL RELEASE;  Surgeon: Cammie Sickle., MD;  Location: [place];  Service: Orthopedics;  Laterality: Left;  . CORONARY ANGIOPLASTY  07/11/2000   Cutting Balloon PTCA for RCA ISR  . CORONARY STENT PLACEMENT  1996   [id number] BMS x 2 in RCA  . CYSTECTOMY  as a child   from neck  . NM MYOVIEW LTD  December 2012   Inferior infarct but no ischemia  . TONSILLECTOMY  as a child  . TRANSTHORACIC ECHOCARDIOGRAM  08/2017   Normal echo-normal LV size and function.  EF 55-60%.  No R WMA.  No ASD/PFO.  No valve lesions.        Home Medications    Prior to Admission medications   Medication Sig Start Date End Date Taking? Authorizing Provider  acetaminophen (TYLENOL) 500 MG tablet Take 1,000 mg by mouth every 4 (four) hours as needed for mild pain or moderate pain.    Yes [provider]  Adalimumab (HUMIRA PEN-CD/UC/HS STARTER) 80 MG/0.8ML PNKT Inject 1 pen into the skin as directed. Inject 40 mg  under skin every 2 weeks. 08/31/17  Yes [name], MD  calcium carbonate (TUMS EX) 750 MG chewable tablet Chew 2 tablets by mouth as needed for heartburn.   Yes [provider]  clopidogrel (PLAVIX) 75 MG tablet TAKE 1 TABLET BY MOUTH DAILY. 12/24/17  Yes [name], MD  esomeprazole (NEXIUM) 40 MG capsule Take 40 mg by mouth daily.    Yes [provider]  LIALDA 1.2 g EC tablet Take 1.2 g by mouth daily with breakfast.  07/21/16  Yes [provider]  nitroGLYCERIN (NITROSTAT) 0.4 MG SL tablet Place 1 tablet (0.4 mg total) under the tongue every 5 (five) minutes as needed for chest pain. 08/21/17 01/04/18 Yes [name], NP  Omega-3 Fatty Acids (FISH OIL)  1000 MG CAPS Take 1,000 mg by mouth daily.   Yes [provider]  rosuvastatin (CRESTOR) 40 MG tablet Take 1 tablet (40 mg total) by mouth daily. 01/02/18 04/02/18 Yes [name], MD  Vilazodone HCl (VIIBRYD) 40 MG TABS Take 40 mg by mouth daily.   Yes [provider]    Family History Family History  Problem Relation Age of Onset  . AAA (abdominal aortic aneurysm) Father        Died from ruptured AAA despite surgery  . CAD Father   . Atrial fibrillation Father   . Aortic stenosis Mother        Status post aVR    Social History Social History   Tobacco Use  . Smoking status: Former Smoker    Last attempt to quit: 03/27/1992    Years since quitting: 25.7  . Smokeless tobacco: Current User    Types: Snuff, Chew  Substance Use Topics  . Alcohol use: No  . Drug use: No     Allergies   Patient has no known allergies.   Review of Systems Review of Systems  All systems reviewed and negative, other than as noted in HPI.  Physical Exam Updated Vital Signs BP (!) 139/96   Pulse 60   Temp 98.6 F (37 C) (Oral)   Resp 17   SpO2 100%   Physical Exam  Constitutional: He appears well-developed and well-nourished. No distress.  HENT:  Head: Normocephalic and atraumatic.  Eyes: Conjunctivae are normal. Right eye exhibits no discharge. Left eye exhibits no discharge.  Neck: Neck supple.  Cardiovascular: Normal rate, regular rhythm and normal heart sounds. Exam reveals no gallop and no friction rub.  No murmur heard. Pulmonary/Chest: Effort normal and breath sounds normal. No respiratory distress.  Abdominal: Soft. He exhibits no distension. There is no tenderness.  Musculoskeletal: He exhibits no edema or tenderness.  Lower extremities symmetric as compared to each other. No calf tenderness. Negative Homan's. No palpable cords.   Neurological: He is alert.  Skin: Skin is warm and dry.  Psychiatric: He has a normal mood and affect. His behavior is  normal. Thought content normal.  Nursing note and vitals reviewed.    ED Treatments / Results  Labs (all labs ordered are listed, but only abnormal results are displayed) Labs Reviewed  BASIC METABOLIC PANEL - Abnormal; Notable for the following components:      Result Value   BUN 23 (*)    Creatinine, Ser 1.37 (*)    GFR calc non Af Amer 58 (*)    All other components within normal limits  CBC  I-STAT TROPONIN, ED    EKG EKG Interpretation  Date/Time:  Friday January 04 2018 11:51:21 EDT Ventricular Rate:  74 PR Interval:  164 QRS Duration: 116 QT Interval:  390 QTC Calculation: 432 R Axis:   -19 Text Interpretation:  Normal sinus rhythm Non-specific ST-t changes Confirmed by Virgel Manifold [phone]) on 01/04/2018 1:28:28 PM   Radiology Dg Chest 2 View  Result Date: 01/04/2018 CLINICAL DATA:  Chest pain EXAM: CHEST - 2 VIEW COMPARISON:  02/05/2011 FINDINGS: The heart size and mediastinal contours are within normal limits. Both lungs are clear. The visualized skeletal structures are unremarkable. IMPRESSION: No active cardiopulmonary disease. Electronically Signed   By: Franchot Gallo M.D.   On: 01/04/2018 12:39    Procedures Procedures (including critical care time)  Medications Ordered in ED Medications  aspirin chewable tablet 324 mg (324 mg Oral Given 01/04/18 1523)     Initial Impression / Assessment and Plan / ED Course  I have reviewed the triage vital signs and the nursing notes.  Pertinent labs & imaging results that were available during my care of the patient were reviewed by me and considered in my medical decision making (see chart for details).     52 year old male with chest pain.  Currently symptom-free.  EKG not acutely changed.  Heart score 5.  Admit for rule out.  Final Clinical Impressions(s) / ED Diagnoses   Final diagnoses:  Chest pain, unspecified type    ED Discharge Orders    None       Virgel Manifold, MD 01/04/18 [phone]

## 2018-01-24 MED FILL — CLOPIDOGREL 75 MG TABLET: 75 | 30 days supply | Qty: 30 | Fill #1

## 2018-01-25 MED FILL — HUMIRA PEN 40 MG/0.4ML PNKT: 40 | 28 days supply | Qty: 2 | Fill #5

## 2018-02-20 MED FILL — CLOPIDOGREL 75 MG TABLET: 75 | 30 days supply | Qty: 30 | Fill #2

## 2018-02-20 MED FILL — HUMIRA PEN 40 MG/0.4ML PNKT: 40 | 28 days supply | Qty: 2 | Fill #6

## 2018-03-05 DIAGNOSIS — J069 Acute upper respiratory infection, unspecified: Secondary | ICD-10-CM | POA: Diagnosis not present

## 2018-03-05 DIAGNOSIS — H6690 Otitis media, unspecified, unspecified ear: Secondary | ICD-10-CM | POA: Diagnosis not present

## 2018-03-05 DIAGNOSIS — K5190 Ulcerative colitis, unspecified, without complications: Secondary | ICD-10-CM | POA: Diagnosis not present

## 2018-03-05 DIAGNOSIS — D849 Immunodeficiency, unspecified: Secondary | ICD-10-CM | POA: Diagnosis not present

## 2018-03-05 MED FILL — FLUTICASONE PROP 50 MCG SPR: 50 | 60 days supply | Qty: 16 | Fill #0

## 2018-03-05 MED FILL — SULFAMETHOXAZOLE-TMP DS TAB: 800-160 | 10 days supply | Qty: 20 | Fill #0

## 2018-03-07 MED FILL — predniSONE 10 MG TABS: 10 | 6 days supply | Qty: 21 | Fill #0

## 2018-03-12 DIAGNOSIS — S63599A Other specified sprain of unspecified wrist, initial encounter: Secondary | ICD-10-CM | POA: Insufficient documentation

## 2018-03-13 DIAGNOSIS — S63501D Unspecified sprain of right wrist, subsequent encounter: Secondary | ICD-10-CM | POA: Diagnosis not present

## 2018-03-13 DIAGNOSIS — M25531 Pain in right wrist: Secondary | ICD-10-CM | POA: Diagnosis not present

## 2018-03-13 DIAGNOSIS — G5601 Carpal tunnel syndrome, right upper limb: Secondary | ICD-10-CM | POA: Diagnosis not present

## 2018-03-13 DIAGNOSIS — K5190 Ulcerative colitis, unspecified, without complications: Secondary | ICD-10-CM | POA: Diagnosis not present

## 2018-03-25 MED FILL — HUMIRA PEN 40 MG/0.4ML PNKT: 40 | 28 days supply | Qty: 2 | Fill #7

## 2018-03-28 MED FILL — ROSUVASTATIN CALCIUM 40 MG: 40 | 90 days supply | Qty: 90 | Fill #1

## 2018-03-28 MED FILL — CLOPIDOGREL 75 MG TABLET: 75 | 30 days supply | Qty: 30 | Fill #3

## 2018-03-28 MED FILL — VIIBRYD 40 MG TABLET: 40 | 90 days supply | Qty: 90 | Fill #1

## 2018-04-24 MED FILL — CLOPIDOGREL 75 MG TABLET: 75 | 60 days supply | Qty: 60 | Fill #4

## 2018-04-24 MED FILL — HUMIRA PEN 40 MG/0.4ML PNKT: 40 | 28 days supply | Qty: 2 | Fill #8

## 2018-04-30 MED FILL — ESOMEPRAZOLE MAG DR 40 MG C: 40 | 90 days supply | Qty: 180 | Fill #0

## 2018-04-30 MED FILL — LIALDA 1.2 GM TABLET SA: 1.2 | 30 days supply | Qty: 120 | Fill #0

## 2018-05-20 MED FILL — HUMIRA PEN 40 MG/0.4ML PNKT: 40 | 28 days supply | Qty: 2 | Fill #9

## 2018-06-15 ENCOUNTER — Other Ambulatory Visit: Payer: Self-pay | Admitting: Cardiology

## 2018-06-15 MED FILL — LIALDA 1.2 GM TABLET SA: 1.2 | 30 days supply | Qty: 120 | Fill #1

## 2018-06-15 MED FILL — HUMIRA PEN 40 MG/0.4ML PNKT: 40 | 28 days supply | Qty: 2 | Fill #10

## 2018-06-15 MED FILL — ROSUVASTATIN CALCIUM 40 MG: 40 | 90 days supply | Qty: 90 | Fill #2

## 2018-06-15 MED FILL — VIIBRYD 40 MG TABLET: 40 | 90 days supply | Qty: 90 | Fill #2

## 2018-06-17 ENCOUNTER — Other Ambulatory Visit: Payer: Self-pay | Admitting: Cardiology

## 2018-06-18 MED FILL — CLOPIDOGREL 75 MG TABLET: 75 | 90 days supply | Qty: 90 | Fill #0

## 2018-08-14 DIAGNOSIS — R7301 Impaired fasting glucose: Secondary | ICD-10-CM | POA: Diagnosis not present

## 2018-08-14 DIAGNOSIS — I130 Hypertensive heart and chronic kidney disease with heart failure and stage 1 through stage 4 chronic kidney disease, or unspecified chronic kidney disease: Secondary | ICD-10-CM | POA: Diagnosis not present

## 2018-08-14 DIAGNOSIS — E7800 Pure hypercholesterolemia, unspecified: Secondary | ICD-10-CM | POA: Diagnosis not present

## 2018-08-15 DIAGNOSIS — G5601 Carpal tunnel syndrome, right upper limb: Secondary | ICD-10-CM | POA: Diagnosis not present

## 2018-09-16 MED FILL — VIIBRYD 40 MG TABLET: 40 | 90 days supply | Qty: 90 | Fill #0

## 2018-09-16 MED FILL — CLOPIDOGREL 75 MG TABLET: 75 | 30 days supply | Qty: 30 | Fill #0

## 2018-09-16 MED FILL — ESOMEPRAZOLE MAG DR 40 MG C: 40 | 90 days supply | Qty: 180 | Fill #1

## 2018-09-16 MED FILL — ROSUVASTATIN CALCIUM 40 MG: 40 | 90 days supply | Qty: 90 | Fill #3

## 2018-10-21 MED FILL — CLOPIDOGREL 75 MG TABLET: 75 | 90 days supply | Qty: 90 | Fill #0

## 2018-10-24 DIAGNOSIS — G4719 Other hypersomnia: Secondary | ICD-10-CM | POA: Diagnosis not present

## 2018-11-04 ENCOUNTER — Other Ambulatory Visit (HOSPITAL_BASED_OUTPATIENT_CLINIC_OR_DEPARTMENT_OTHER): Payer: Self-pay

## 2018-11-04 DIAGNOSIS — G4710 Hypersomnia, unspecified: Secondary | ICD-10-CM

## 2018-11-04 DIAGNOSIS — R0681 Apnea, not elsewhere classified: Secondary | ICD-10-CM

## 2018-11-13 ENCOUNTER — Other Ambulatory Visit: Payer: Self-pay

## 2018-11-13 ENCOUNTER — Ambulatory Visit: Payer: 59 | Attending: Internal Medicine | Admitting: Internal Medicine

## 2018-11-13 DIAGNOSIS — R0681 Apnea, not elsewhere classified: Secondary | ICD-10-CM | POA: Insufficient documentation

## 2018-11-13 DIAGNOSIS — G4710 Hypersomnia, unspecified: Secondary | ICD-10-CM | POA: Diagnosis not present

## 2018-11-14 DIAGNOSIS — G5601 Carpal tunnel syndrome, right upper limb: Secondary | ICD-10-CM | POA: Diagnosis not present

## 2018-11-14 DIAGNOSIS — M25531 Pain in right wrist: Secondary | ICD-10-CM | POA: Diagnosis not present

## 2018-11-17 DIAGNOSIS — G4752 REM sleep behavior disorder: Secondary | ICD-10-CM | POA: Diagnosis not present

## 2018-11-17 NOTE — Procedures (Signed)
    NAME: [facility] DATE OF BIRTH:  [birth year] MEDICAL RECORD NUMBER [id number]  LOCATION: [facility]  PHYSICIAN: Marius Ditch  DATE OF STUDY: 11/13/2018  SLEEP STUDY TYPE: Out of Center Sleep Test                REFERRING PHYSICIAN: Marius Ditch, MD  INDICATION FOR STUDY: excessive daytime sleepiness, snoring, witnessed apnea  EPWORTH SLEEPINESS SCORE:  8 HEIGHT:    WEIGHT:      There is no height or weight on file to calculate BMI.  NECK SIZE:   in.  MEDICATIONS  Patient self administered medications include: N/A. Medications administered during study include No sleep medicine administered.[name]   SLEEP STUDY TECHNIQUE  A multi-channel overnight portable sleep study was performed. The channels recorded were: nasal and oral airflow, thoracic and abdominal respiratory movement, and oxygen saturation with a pulse oximetry. Snoring and body position were also monitored.  TECHNICIAN COMMENTS  Comments added by Technician: N/A Comments added by Scorer: N/A   RECORDING SUMMARY  The study was initiated at 9:15:57 PM and terminated at 12:52:03 AM. The total recorded time was 216.1 minutes. Time in bed was 216.1 minutes.   RESPIRATORY PARAMETERS  The overall AHI was 0.9 per hour, with a central apnea index of 0.0 per hour. The patient was supine for 10.6%. The arousal index was 0.0 per hour.The oxygen nadir was 90% during sleep.  CARDIAC DATA  Mean heart rate during sleep was 68.9 bpm.  IMPRESSIONS  Sleep study limited to four hours. Consider repeat testing depending on discussion with patient  Consider in lab testing for unexpectedly negative HSAT.  No Significant Obstructive Sleep apnea(OSA)  DIAGNOSIS  Normal study  RECOMENDATIONS  See comments above.  Patient may benefit from in-lab study   Marius Ditch Sleep specialist, [place] [employer]  ELECTRONICALLY SIGNED ON:  11/17/2018, 3:16 PM [place] PH: [phone]   FX: [phone] [place]

## 2018-11-22 ENCOUNTER — Telehealth: Payer: Self-pay

## 2018-11-22 NOTE — Telephone Encounter (Signed)
   [facility] Pre-operative Risk Assessment    Request for surgical clearance:  1. What type of surgery is being performed? Right carpal tunnel release   2. When is this surgery scheduled? 10.29.2020   3. What type of clearance is required (medical clearance vs. Pharmacy clearance to hold med vs. Both)? Pharmacy  4. Are there any medications that need to be held prior to surgery and how long? Plavix instructsion   5. Practice name and name of physician performing surgery? Dr Bonnetta Barry   6. What is your office phone number [phone]    7.   What is your office fax [phone]  8.   Anesthesia type (None, local, MAC, general) ? none   Newt Minion 11/22/2018, 5:12 PM  _________________________________________________________________   (provider comments below)

## 2018-11-24 NOTE — Telephone Encounter (Signed)
   Primary Cardiologist:David Ellyn Hack, MD  Chart reviewed as part of pre-operative protocol coverage. Because of [facility]'s past medical history and time since last visit, he/she will require a follow-up visit in order to better assess preoperative cardiovascular risk.  Pre-op covering staff: - Please schedule appointment and call patient to inform them. - Please contact requesting surgeon's office via preferred method (i.e, phone, fax) to inform them of need for appointment prior to surgery.  If applicable, this message will also be routed to pharmacy pool and/or primary cardiologist for input on holding anticoagulant/antiplatelet agent as requested below so that this information is available at time of patient's appointment.   Please arrange followup with Dr. Ellyn Hack in October before 10/20 (since it may need patient to hold plavix for 5-7 days).  Almyra Deforest, [place]  11/24/2018, 5:37 PM

## 2018-11-25 NOTE — Telephone Encounter (Signed)
I left a message for the patient to return my call making appointment to see Dr. Ellyn Hack in October to be cleared for surgery.

## 2018-11-26 NOTE — Telephone Encounter (Signed)
Pt scheduled for a follow-up appointment with Dr. Ellyn Hack 12/19/2018 and clearance will be addressed at that appointment. Will route back to the requesting surgeon's office to make them aware.

## 2018-12-13 ENCOUNTER — Other Ambulatory Visit: Payer: Self-pay | Admitting: Cardiology

## 2018-12-19 ENCOUNTER — Telehealth: Payer: 59 | Admitting: Cardiology

## 2018-12-27 MED FILL — VIIBRYD 40 MG TABLET: 40 | 90 days supply | Qty: 90 | Fill #1

## 2018-12-27 MED FILL — ROSUVASTATIN CALCIUM 40 MG: 40 | 90 days supply | Qty: 90 | Fill #0

## 2018-12-27 MED FILL — LIALDA 1.2 GM TABLET SA: 1.2 | 30 days supply | Qty: 120 | Fill #2

## 2019-01-14 MED FILL — CEPHALEXIN 500 MG CAPSULE: 500 | 5 days supply | Qty: 20 | Fill #0

## 2019-01-17 DIAGNOSIS — K429 Umbilical hernia without obstruction or gangrene: Secondary | ICD-10-CM | POA: Diagnosis not present

## 2019-01-27 DIAGNOSIS — H524 Presbyopia: Secondary | ICD-10-CM | POA: Diagnosis not present

## 2019-01-27 MED FILL — CLOPIDOGREL 75 MG TABLET: 75 | 90 days supply | Qty: 90 | Fill #1

## 2019-02-19 DIAGNOSIS — G5601 Carpal tunnel syndrome, right upper limb: Secondary | ICD-10-CM | POA: Diagnosis not present

## 2019-02-25 ENCOUNTER — Ambulatory Visit: Payer: Self-pay | Admitting: Surgery

## 2019-02-25 DIAGNOSIS — K429 Umbilical hernia without obstruction or gangrene: Secondary | ICD-10-CM | POA: Diagnosis not present

## 2019-02-25 DIAGNOSIS — K4020 Bilateral inguinal hernia, without obstruction or gangrene, not specified as recurrent: Secondary | ICD-10-CM | POA: Diagnosis not present

## 2019-02-25 NOTE — H&P (Signed)
Daniel Buck Documented: 02/25/2019 8:35 AM Location: [facility] Patient #: [id number] DOB: 08/18/1965 Married / Language: English / Race: White Male  History of Present Illness Adin Hector MD; 02/25/2019 12:16 PM) The patient is a 53 year old male who presents for an evaluation of a hernia. Note for "Hernia": ` ` ` Patient sent for surgical consultation at the request of Dr Marisue Humble  Chief Complaint: Periumbilical hernia ` ` The patient is a active male. Used to be obese with tenderness change his diet and exercises much more regularly. Likes to do weight lifting. He was diagnosed with a right inguinal hernia last year. Occasionally gets some sharp testicular pain with heavy lifting. That he noticed a lump around his belly button. Concerned him. Saw primary care physician. Hernia suspected. Surgery offered. Patient does not had any prior abdominal surgery. No tobacco use. No diabetic. He can walk several miles without difficulty. Usually moves his bowels once in the morning. No history of skin infections or abscesses.  (Review of systems as stated in this history (HPI) or in the review of systems. Otherwise all other 12 point ROS are negative) ` ` `   Past Surgical History (Tanisha A. Owens Shark, [place]; 02/25/2019 8:36 AM) Colon Polyp Removal - Colonoscopy Tonsillectomy  Diagnostic Studies History (Tanisha A. Owens Shark, [place]; 02/25/2019 8:36 AM) Colonoscopy 1-5 years ago  Allergies (Tanisha A. Owens Shark, [place]; 02/25/2019 8:36 AM) No Known Drug Allergies [02/25/2019]: Allergies Reconciled  Medication History (Tanisha A. Owens Shark, [place]; 02/25/2019 8:37 AM) Clopidogrel Bisulfate (75MG Tablet, Oral) Active. Lialda (1.2GM Tablet DR, Oral) Active. Viibryd (40MG Tablet, Oral) Active. Rosuvastatin Calcium (40MG Tablet, Oral) Active. Esomeprazole Magnesium (40MG Capsule DR, Oral) Active. Vitamin D (Oral) Specific strength unknown - Active. Medications Reconciled   Social History (Tanisha A. Owens Shark, [place]; 02/25/2019 8:36 AM) Caffeine use Coffee. No alcohol use No drug use Tobacco use Former smoker.  Family History (Tanisha A. Owens Shark, [place]; 02/25/2019 8:36 AM) Breast Cancer Mother. Cerebrovascular Accident Mother. Heart Disease Father. Heart disease in male family member before age 29 Hypertension Father. Migraine Headache Sister.  Other Problems (Tanisha A. Owens Shark, [place]; 02/25/2019 8:36 AM) Arthritis Back Pain Depression Gastric Ulcer Gastroesophageal Reflux Disease Hemorrhoids Hypercholesterolemia Inguinal Hernia Migraine Headache Myocardial infarction Ulcerative Colitis Umbilical Hernia Repair     Review of Systems (Tanisha A. Brown RMA; 02/25/2019 8:36 AM) General Present- Fatigue and Night Sweats. Not Present- Appetite Loss, Chills, Fever, Weight Gain and Weight Loss. Skin Not Present- Change in Wart/Mole, Dryness, Hives, Jaundice, New Lesions, Non-Healing Wounds, Rash and Ulcer. HEENT Present- Hearing Loss. Not Present- Earache, Hoarseness, Nose Bleed, Oral Ulcers, Ringing in the Ears, Seasonal Allergies, Sinus Pain, Sore Throat, Visual Disturbances, Wears glasses/contact lenses and Yellow Eyes. Gastrointestinal Present- Difficulty Swallowing. Not Present- Abdominal Pain, Bloating, Bloody Stool, Change in Bowel Habits, Chronic diarrhea, Constipation, Excessive gas, Gets full quickly at meals, Hemorrhoids, Indigestion, Nausea, Rectal Pain and Vomiting. Male Genitourinary Present- Frequency. Not Present- Blood in Urine, Change in Urinary Stream, Impotence, Nocturia, Painful Urination, Urgency and Urine Leakage. Musculoskeletal Present- Joint Pain. Not Present- Back Pain, Joint Stiffness, Muscle Pain, Muscle Weakness and Swelling of Extremities. Endocrine Present- Cold Intolerance. Not Present- Excessive Hunger, Hair Changes, Heat Intolerance, Hot flashes and New Diabetes.  Vitals (Tanisha A. Brown RMA; 02/25/2019 8:36 AM)  02/25/2019 8:36 AM Weight: 206 lb Height: 71in Body Surface Area: 2.14 m Body Mass Index: 28.73 kg/m  Temp.: 98.63F  Pulse: 78 (Regular)  BP: 124/84 (Sitting, Left Arm, Standard)  Physical Exam Adin Hector MD; 02/25/2019 9:27 AM)  General Mental Status-Alert. General Appearance-Not in acute distress, Not Sickly. Orientation-Oriented X3. Hydration-Well hydrated. Voice-Normal.  Integumentary Global Assessment Upon inspection and palpation of skin surfaces of the - Axillae: non-tender, no inflammation or ulceration, no drainage. and Distribution of scalp and body hair is normal. General Characteristics Temperature - normal warmth is noted.  Head and Neck Head-normocephalic, atraumatic with no lesions or palpable masses. Face Global Assessment - atraumatic, no absence of expression. Neck Global Assessment - no abnormal movements, no bruit auscultated on the right, no bruit auscultated on the left, no decreased range of motion, non-tender. Trachea-midline. Thyroid Gland Characteristics - non-tender.  Eye Eyeball - Left-Extraocular movements intact, No Nystagmus - Left. Eyeball - Right-Extraocular movements intact, No Nystagmus - Right. Cornea - Left-No Hazy - Left. Cornea - Right-No Hazy - Right. Sclera/Conjunctiva - Left-No scleral icterus, No Discharge - Left. Sclera/Conjunctiva - Right-No scleral icterus, No Discharge - Right. Pupil - Left-Direct reaction to light normal. Pupil - Right-Direct reaction to light normal.  ENMT Ears Pinna - Left - no drainage observed, no generalized tenderness observed. Pinna - Right - no drainage observed, no generalized tenderness observed. Nose and Sinuses External Inspection of the Nose - no destructive lesion observed. Inspection of the nares - Left - quiet respiration. Inspection of the nares - Right - quiet respiration. Mouth and Throat Lips - Upper Lip - no fissures  observed, no pallor noted. Lower Lip - no fissures observed, no pallor noted. Nasopharynx - no discharge present. Oral Cavity/Oropharynx - Tongue - no dryness observed. Oral Mucosa - no cyanosis observed. Hypopharynx - no evidence of airway distress observed.  Chest and Lung Exam Inspection Movements - Normal and Symmetrical. Accessory muscles - No use of accessory muscles in breathing. Palpation Palpation of the chest reveals - Non-tender. Auscultation Breath sounds - Normal and Clear.  Cardiovascular Auscultation Rhythm - Regular. Murmurs & Other Heart Sounds - Auscultation of the heart reveals - No Murmurs and No Systolic Clicks.  Abdomen Inspection Inspection of the abdomen reveals - No Visible peristalsis and No Abnormal pulsations. Umbilicus - No Bleeding, No Urine drainage. Palpation/Percussion Palpation and Percussion of the abdomen reveal - Soft, Non Tender, No Rebound tenderness, No Rigidity (guarding) and No Cutaneous hyperesthesia. Note: Abdomen soft. Nontender. Not distended. Small supraumbilical hernia reducible. No guarding.  Male Genitourinary Sexual Maturity Tanner 5 - Adult hair pattern and Adult penile size and shape. Note: Left inguinal hernia. Right hernia suspected with cough and Valsalva. Otherwise normal external male genitalia.  Peripheral Vascular Upper Extremity Inspection - Left - No Cyanotic nailbeds - Left, Not Ischemic. Inspection - Right - No Cyanotic nailbeds - Right, Not Ischemic.  Neurologic Neurologic evaluation reveals -normal attention span and ability to concentrate, able to name objects and repeat phrases. Appropriate fund of knowledge , normal sensation and normal coordination. Mental Status Affect - not angry, not paranoid. Cranial Nerves-Normal Bilaterally. Gait-Normal.  Neuropsychiatric Mental status exam performed with findings of-able to articulate well with normal speech/language, rate, volume and coherence, thought  content normal with ability to perform basic computations and apply abstract reasoning and no evidence of hallucinations, delusions, obsessions or homicidal/suicidal ideation.  Musculoskeletal Global Assessment Spine, Ribs and Pelvis - no instability, subluxation or laxity. Right Upper Extremity - no instability, subluxation or laxity.  Lymphatic Head & Neck  General Head & Neck Lymphatics: Bilateral - Description - No Localized lymphadenopathy. Axillary  General Axillary Region: Bilateral - Description - No Localized  lymphadenopathy. Femoral & Inguinal  Generalized Femoral & Inguinal Lymphatics: Left - Description - No Localized lymphadenopathy. Right - Description - No Localized lymphadenopathy.    Assessment & Plan Adin Hector MD; 02/25/2019 12:16 PM)  BILATERAL INGUINAL HERNIA WITHOUT OBSTRUCTION OR GANGRENE, RECURRENCE NOT SPECIFIED (K40.20) Impression: Bilateral inguinal hernias. He's had more symptoms of bulging in the groin discomfort on the right side but the left side seems more obvious on exam. Regardless of think he would benefit from repair. Good candidate for laparoscopic preperitoneal approach. Most likely would start with a tap approach. Periumbilical hernia not giant but he is very physically active side most likely I would do a mesh underlay repair there as well. Most likely an intraperitoneal underlay repair with mesh  At some point he would like to consider surgery. He is leaning more towards waiting until sometime next year when he has more time to recover. He's not severely symptomatic, reasonable to wait.  Current Plans The anatomy & physiology of the abdominal wall and pelvic floor was discussed. The pathophysiology of hernias in the inguinal and pelvic region was discussed. Natural history risks such as progressive enlargement, pain, incarceration, and strangulation was discussed. Contributors to complications such as smoking, obesity, diabetes, prior  surgery, etc were discussed.  I feel the risks of no intervention will lead to serious problems that outweigh the operative risks; therefore, I recommended surgery to reduce and repair the hernia. I explained laparoscopic techniques with possible need for an open approach. I noted usual use of mesh to patch and/or buttress hernia repair  Risks such as bleeding, infection, abscess, need for further treatment, heart attack, death, and other risks were discussed. I noted a good likelihood this will help address the problem. Goals of post-operative recovery were discussed as well. Possibility that this will not correct all symptoms was explained. I stressed the importance of low-impact activity, aggressive pain control, avoiding constipation, & not pushing through pain to minimize risk of post-operative chronic pain or injury. Possibility of reherniation was discussed. We will work to minimize complications.  An educational handout further explaining the pathology & treatment options was given as well. Questions were answered. The patient expresses understanding & wishes to proceed with surgery.   UMBILICAL HERNIA WITHOUT OBSTRUCTION AND WITHOUT GANGRENE (K42.9) Impression: Periumbilical hernia and very active physical and. Most likely plan mesh repair since his intense bodybuilding requirements and history of prior obesity make recurrence higher with stitches only  Current Plans The anatomy & physiology of the abdominal wall was discussed. The pathophysiology of hernias was discussed. Natural history risks without surgery including progeressive enlargement, pain, incarceration, & strangulation was discussed. Contributors to complications such as smoking, obesity, diabetes, prior surgery, etc were discussed.  I feel the risks of no intervention will lead to serious problems that outweigh the operative risks; therefore, I recommended surgery to reduce and repair the hernia. I explained  laparoscopic techniques with possible need for an open approach. I noted the probable use of mesh to patch and/or buttress the hernia repair  Risks such as bleeding, infection, abscess, need for further treatment, heart attack, death, and other risks were discussed. I noted a good likelihood this will help address the problem. Goals of post-operative recovery were discussed as well. Possibility that this will not correct all symptoms was explained. I stressed the importance of low-impact activity, aggressive pain control, avoiding constipation, & not pushing through pain to minimize risk of post-operative chronic pain or injury. Possibility of reherniation especially with  smoking, obesity, diabetes, immunosuppression, and other health conditions was discussed. We will work to minimize complications.  An educational handout further explaining the pathology & treatment options was given as well. Questions were answered. The patient expresses understanding & wishes to proceed with surgery.   PREOP - ING HERNIA - ENCOUNTER FOR PREOPERATIVE EXAMINATION FOR GENERAL SURGICAL PROCEDURE (Z01.818)  Current Plans You are being scheduled for surgery- Our schedulers will call you.  You should hear from our office's scheduling department within 5 working days about the location, date, and time of surgery. We try to make accommodations for patient's preferences in scheduling surgery, but sometimes the OR schedule or the surgeon's schedule prevents [place] from making those accommodations.  If you have not heard from our office [phone]) in 5 working days, call the office and ask for your surgeon's nurse.  If you have other questions about your diagnosis, plan, or surgery, call the office and ask for your surgeon's nurse.  Written instructions provided Pt Education - Pamphlet Given - Laparoscopic Hernia Repair: discussed with patient and provided information. Pt Education - [facility] Pain Control (Nisaiah Bechtol)  Pt Education - [facility] Hernia Post-Op HCI (Jamesmichael Shadd): discussed with patient and provided information. Pt Education - [facility] Mesh education: discussed with patient and provided information.  Adin Hector, MD, FACS, MASCRS Gastrointestinal and Minimally Invasive Surgery  [facility] [address], [place], [place] [address] [phone] Main / Paging [phone] Fax

## 2019-03-04 DIAGNOSIS — M109 Gout, unspecified: Secondary | ICD-10-CM | POA: Diagnosis not present

## 2019-03-04 MED FILL — COLCHICINE 0.6 MG TABS: 0.6 | 10 days supply | Qty: 30 | Fill #0

## 2019-03-19 ENCOUNTER — Ambulatory Visit: Payer: 59 | Admitting: Adult Health

## 2019-03-24 MED FILL — VIIBRYD 40 MG TABLET: 40 | 90 days supply | Qty: 90 | Fill #2

## 2019-04-07 ENCOUNTER — Other Ambulatory Visit: Payer: Self-pay | Admitting: Cardiology

## 2019-04-07 MED FILL — ROSUVASTATIN CALCIUM 40 MG: 40 | 90 days supply | Qty: 90 | Fill #0

## 2019-04-07 MED FILL — LIALDA 1.2 GM TABLET SA: 1.2 | 30 days supply | Qty: 120 | Fill #3

## 2019-04-07 MED FILL — ESOMEPRAZOLE MAG DR 40 MG C: 40 | 90 days supply | Qty: 180 | Fill #2

## 2019-04-09 MED FILL — CLOPIDOGREL 75 MG TABLET: 75 | 90 days supply | Qty: 90 | Fill #2

## 2019-04-24 ENCOUNTER — Ambulatory Visit: Payer: 59 | Admitting: Cardiology

## 2019-04-24 ENCOUNTER — Ambulatory Visit: Payer: 59 | Admitting: Adult Health

## 2019-04-30 DIAGNOSIS — G5601 Carpal tunnel syndrome, right upper limb: Secondary | ICD-10-CM | POA: Diagnosis not present

## 2019-05-02 ENCOUNTER — Telehealth: Payer: Self-pay | Admitting: Cardiology

## 2019-05-02 DIAGNOSIS — Z01818 Encounter for other preprocedural examination: Secondary | ICD-10-CM | POA: Diagnosis not present

## 2019-05-02 NOTE — Telephone Encounter (Signed)
   Primary Cardiologist:David Ellyn Hack, MD 01/03/2018  Chart reviewed as part of pre-operative protocol coverage. Because of Daniel Buck's past medical history and time since last visit, he/she will require a follow-up visit in order to better assess preoperative cardiovascular risk.  Will route to Dr Ellyn Hack as well to address holding Plavix x 5 days.  Pre-op covering staff: - Please schedule appointment and call patient to inform them. - Please contact requesting surgeon's office via preferred method (i.e, phone, fax) to inform them of need for appointment prior to surgery.  If applicable, this message will also be routed to pharmacy pool and/or primary cardiologist for input on holding anticoagulant/antiplatelet agent as requested below so that this information is available at time of patient's appointment.   Rosaria Ferries, PA-C  05/02/2019, 3:36 PM

## 2019-05-02 NOTE — Telephone Encounter (Signed)
I have not seen him since October 2019.  It is probably fine for him to hold Plavix, but I cannot give any guidance on whether or not he is having symptoms.  Glenetta Hew, MD Is

## 2019-05-02 NOTE — Telephone Encounter (Signed)
Please call pt to schedule appt for clearance per Rosaria Ferries, PA. Can schedule with Dr. Ellyn Hack or APP.

## 2019-05-02 NOTE — Telephone Encounter (Signed)
   [facility] Pre-operative Risk Assessment    Request for surgical clearance:  1. What type of surgery is being performed? Laparoscopic Bilateral Inguinal Hernia repair, and a periumbilical hernia repair   2. When is this surgery scheduled? 05-08-19  3. What type of clearance is required (medical clearance vs. Pharmacy clearance to hold med vs. Both)? Pharmacy  4. Are there any medications that need to be held prior to surgery and how long? Plavix 5 Days  5. Practice name and name of physician performing surgery? Dr. Alwyn Pea, [facility]  6. What is your office phone number: [phone]    7.   What is your office fax number: [phone]  8.   Anesthesia type (None, local, MAC, general) ? General   Johnna Acosta 05/02/2019, 1:50 PM  _________________________________________________________________   (provider comments below)

## 2019-05-02 NOTE — Telephone Encounter (Signed)
Faxed successfully to requesting office via [facility] fax function.

## 2019-05-05 NOTE — Telephone Encounter (Signed)
Patient has an appointment with Kerin Ransom, PA-C, on 05/06/2019 for pre-op evaluation. Will route request form to [facility] and remove from pre-op pool.

## 2019-05-05 NOTE — Telephone Encounter (Signed)
Appt scheduled with Jana Half on 05/06/19.

## 2019-05-06 ENCOUNTER — Telehealth (INDEPENDENT_AMBULATORY_CARE_PROVIDER_SITE_OTHER): Payer: 59 | Admitting: Cardiology

## 2019-05-06 ENCOUNTER — Ambulatory Visit: Payer: 59 | Admitting: Cardiology

## 2019-05-06 ENCOUNTER — Telehealth: Payer: Self-pay

## 2019-05-06 ENCOUNTER — Encounter: Payer: Self-pay | Admitting: Cardiology

## 2019-05-06 VITALS — Ht 70.5 in | Wt 198.0 lb

## 2019-05-06 DIAGNOSIS — E785 Hyperlipidemia, unspecified: Secondary | ICD-10-CM

## 2019-05-06 DIAGNOSIS — I2510 Atherosclerotic heart disease of native coronary artery without angina pectoris: Secondary | ICD-10-CM | POA: Diagnosis not present

## 2019-05-06 DIAGNOSIS — K219 Gastro-esophageal reflux disease without esophagitis: Secondary | ICD-10-CM

## 2019-05-06 DIAGNOSIS — I10 Essential (primary) hypertension: Secondary | ICD-10-CM

## 2019-05-06 DIAGNOSIS — K51919 Ulcerative colitis, unspecified with unspecified complications: Secondary | ICD-10-CM

## 2019-05-06 DIAGNOSIS — Z9861 Coronary angioplasty status: Secondary | ICD-10-CM

## 2019-05-06 DIAGNOSIS — Z01818 Encounter for other preprocedural examination: Secondary | ICD-10-CM | POA: Insufficient documentation

## 2019-05-06 NOTE — Progress Notes (Signed)
Virtual Visit via Telephone Note   This visit type was conducted due to national recommendations for restrictions regarding the COVID-19 Pandemic (e.g. social distancing) in an effort to limit this patient's exposure and mitigate transmission in our community.  Due to his co-morbid illnesses, this patient is at least at moderate risk for complications without adequate follow up.  This format is felt to be most appropriate for this patient at this time.  The patient did not have access to video technology/had technical difficulties with video requiring transitioning to audio format only (telephone).  All issues noted in this document were discussed and addressed.  No physical exam could be performed with this format.  Please refer to the patient's chart for his  consent to telehealth for [facility].   Date:  05/06/2019   ID:  Daniel Buck, DOB March 23, 1966, MRN [id number]  Patient Location: Home Provider Location: Office  PCP:  [name], MD  Cardiologist:  Glenetta Hew, MD  Electrophysiologist:  None   Evaluation Performed:  Pre op clerance  Chief Complaint:  none  History of Present Illness:    Daniel Buck is a pleasant 54 y.o. male with a history of CAD.  He had a remote inferior MI in 1996 treated with RCA BMS.  He had in-stent restenosis treated with atherectomy in 2002.  His last functional study was a nuclear stress in 2012 and this was low risk.  He has done well from a cardiac standpoint since.  He lives on [place] farm in [place], he lives near where Dr. Rollene Fare keeps his horses.  He was contacted today for preoperative clearance prior to inguinal hernia repair.  Since we last spoke to the patient he denies any chest pain or unusual shortness of breath.  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  This of course has all been on hold until after his hernia repair.  Other medical issues include a past  history of hypertension although he was taken off medications in there past because of symptomatic orthostatic blood pressures.  He has palpitations in the past although these have been infrequent and have not required beta-blocker therapy.  He has dyslipidemia and in October 2019 Dr. Ellyn Hack transitioned him from simvastatin to rosuvastatin.  He is tolerating this well.  Other medical issues include GERD on PPI and history of ulcerative colitis..  The patient does not have symptoms concerning for COVID-19 infection (fever, chills, cough, or new shortness of breath).    Past Medical History:  Diagnosis Date  . CAD S/P percutaneous coronary angioplasty 1996, 2002   a. s/p Inf MI (age 48) >>PCI to RCA with tandem BMS (PS 1535); b. LHC 06/2000: pRCA 30-40%, prox/mid stents with 75% ISR, 30-40% at crux in RCA >> PCI: Cutting Balloon atherectomy for ISR; c. 2012 MV: inf ischemia, no scar.  . Carpal tunnel syndrome on left 12/2011  . Chronic nasal congestion   . Depression 01/19/2012  . GERD (gastroesophageal reflux disease)   . Gout   . History of Doppler ultrasound    a. Carotid US 3/11: no ICA stenosis   . History of echocardiogram    a. Echo 6/14: mod LVH, EF 50-55%, inf-lat HK, Gr 1 DD, mild LAE  . Hyperlipidemia LDL goal < 70   . Hypertension    under control; has been on med. since 1996  . Myocardial infarct, old 1996   PCI  .  Ulcerative colitis [facility])    Past Surgical History:  Procedure Laterality Date  . CARPAL TUNNEL RELEASE  01/26/2012   Procedure: CARPAL TUNNEL RELEASE;  Surgeon: Cammie Sickle., MD;  Location: [facility];  Service: Orthopedics;  Laterality: Left;  . CORONARY ANGIOPLASTY  07/11/2000   Cutting Balloon PTCA for RCA ISR  . CORONARY STENT PLACEMENT  1996   [id number] BMS x 2 in RCA  . CYSTECTOMY  as a child   from neck  . NM MYOVIEW LTD  December 2012   Inferior infarct but no ischemia  . TONSILLECTOMY  as a child  . TRANSTHORACIC ECHOCARDIOGRAM   08/2017   Normal echo-normal LV size and function.  EF 55-60%.  No R WMA.  No ASD/PFO.  No valve lesions.     Current Meds  Medication Sig  . acetaminophen (TYLENOL) 500 MG tablet Take 1,000 mg by mouth every 4 (four) hours as needed for mild pain or moderate pain.   . calcium carbonate (TUMS EX) 750 MG chewable tablet Chew 2 tablets by mouth as needed for heartburn.  . clopidogrel (PLAVIX) 75 MG tablet TAKE 1 TABLET BY MOUTH DAILY.  [name] esomeprazole (NEXIUM) 40 MG capsule Take 40 mg by mouth daily.   [name] LIALDA 1.2 g EC tablet Take 1.2 g by mouth daily with breakfast.   . nitroGLYCERIN (NITROSTAT) 0.4 MG SL tablet Place 1 tablet (0.4 mg total) under the tongue every 5 (five) minutes as needed for chest pain.  . Omega-3 Fatty Acids (FISH OIL) 1000 MG CAPS Take 1,000 mg by mouth daily.  . rosuvastatin (CRESTOR) 40 MG tablet TAKE 1 TABLET (40 MG TOTAL) BY MOUTH DAILY.  . Vilazodone HCl (VIIBRYD) 40 MG TABS Take 40 mg by mouth daily.     Allergies:   Patient has no known allergies.   Social History   Tobacco Use  . Smoking status: Former Smoker    Quit date: 03/27/1992    Years since quitting: 27.1  . Smokeless tobacco: Current User    Types: Snuff, Chew  Substance Use Topics  . Alcohol use: No  . Drug use: No     Family Hx: The patient's family history includes AAA (abdominal aortic aneurysm) in his father; Aortic stenosis in his mother; Atrial fibrillation in his father; CAD in his father.  ROS:   Please see the history of present illness.    All other systems reviewed and are negative.   Prior CV studies:   The following studies were reviewed today: Myoview 2012  Labs/Other Tests and Data Reviewed:    EKG:  An ECG dated Oct 2019 was personally reviewed today and demonstrated:  NSR-HR 74  Recent Labs: No results found for requested labs within last 8760 hours.   Recent Lipid Panel Lab Results  Component Value Date/Time   CHOL 224 (H) 08/21/2017 12:36 PM   TRIG 263 (H)  08/21/2017 12:36 PM   HDL 40 08/21/2017 12:36 PM   CHOLHDL 5.6 (H) 08/21/2017 12:36 PM   CHOLHDL 6.6 06/14/2016 08:22 AM   LDLCALC 131 (H) 08/21/2017 12:36 PM    Wt Readings from Last 3 Encounters:  05/06/19 198 lb (89.8 kg)  01/02/18 200 lb (90.7 kg)  08/21/17 201 lb 6.4 oz (91.4 kg)     Objective:    Vital Signs:  Ht 5' 10.5" (1.791 m)   Wt 198 lb (89.8 kg)   BMI 28.01 kg/m    VITAL SIGNS:  reviewed  ASSESSMENT &  PLAN:    Pre op clearance Patient is an acceptable risk for proposed procedure without further cardiac testing.   CAD- RCA BMS in setting of DMI in 1996- ISR 2002- HSRA Low risk Myoview 2012  HLD- PCP follows  GERD- Pt is on Plavix alone secondary to GI side effects from ASA and Plavix.  H/U ulcerative colitis-  COVID-19 Education: The signs and symptoms of COVID-19 were discussed with the patient and how to seek care for testing (follow up with PCP or arrange E-visit).  The importance of social distancing was discussed today.  Time:   Today, I have spent 15 minutes with the patient with telehealth technology discussing the above problems.     Medication Adjustments/Labs and Tests Ordered: Current medicines are reviewed at length with the patient today.  Concerns regarding medicines are outlined above.   Tests Ordered: No orders of the defined types were placed in this encounter.   Medication Changes: No orders of the defined types were placed in this encounter.   Follow Up:  In Person Dr Ellyn Hack in Oct 2021  Signed, Kerin Ransom, [place]  05/06/2019 10:22 AM    [place]

## 2019-05-06 NOTE — Telephone Encounter (Signed)

## 2019-05-06 NOTE — Patient Instructions (Signed)
Medication Instructions:  Your physician recommends that you continue on your current medications as directed. Please refer to the Current Medication list given to you today. *If you need a refill on your cardiac medications before your next appointment, please call your pharmacy*  Lab Work: None  If you have labs (blood work) drawn today and your tests are completely normal, you will receive your results only by: [name] MyChart Message (if you have MyChart) OR . A paper copy in the mail If you have any lab test that is abnormal or we need to change your treatment, we will call you to review the results.  Testing/Procedures: None   Follow-Up: At [facility], you and your health needs are our priority.  As part of our continuing mission to provide you with exceptional heart care, we have created designated Provider Care Teams.  These Care Teams include your primary Cardiologist (physician) and Advanced Practice Providers (APPs -  Physician Assistants and Nurse Practitioners) who all work together to provide you with the care you need, when you need it.  Your next appointment:   8 month(s)  The format for your next appointment:   In Person  Provider:   Glenetta Hew, MD  You have an appointment scheduled with Dr Ellyn Hack in March you can cancel the appointment if you like. Its your preference.  Other Instructions

## 2019-05-06 NOTE — Telephone Encounter (Signed)
   Primary Cardiologist: Glenetta Hew, MD  Chart reviewed and patient contacted by phone today as part of pre-operative protocol coverage. Given past medical history and time since last visit, based on ACC/AHA guidelines, Daniel Buck would be at acceptable risk for the planned procedure without further cardiovascular testing.    OK to hold Plavix 5 days pre op if needed.   I will route this recommendation to the requesting party via Epic fax function and remove from pre-op pool.  Please call with questions.  Kerin Ransom, PA-C 05/06/2019, 10:39 AM

## 2019-05-06 NOTE — Telephone Encounter (Signed)
Contacted patient to discuss AVS Instructions. Gave patient Luke's recommendations from today's virtual office visit. Informed patient that someone from the scheduling dept will be in contact with them to schedule their follow up appt. Patient voiced understanding; AVS printed and mailed to patient.

## 2019-05-08 DIAGNOSIS — K429 Umbilical hernia without obstruction or gangrene: Secondary | ICD-10-CM | POA: Diagnosis not present

## 2019-05-08 DIAGNOSIS — K4020 Bilateral inguinal hernia, without obstruction or gangrene, not specified as recurrent: Secondary | ICD-10-CM | POA: Diagnosis not present

## 2019-05-08 DIAGNOSIS — D176 Benign lipomatous neoplasm of spermatic cord: Secondary | ICD-10-CM | POA: Diagnosis not present

## 2019-05-08 MED FILL — traMADol HCL 50 MG TABS: 50 | 4 days supply | Qty: 30 | Fill #0

## 2019-05-22 MED FILL — predniSONE 10 MG TABS: 10 | 10 days supply | Qty: 30 | Fill #0

## 2019-05-26 DIAGNOSIS — F411 Generalized anxiety disorder: Secondary | ICD-10-CM | POA: Diagnosis not present

## 2019-05-26 DIAGNOSIS — I2510 Atherosclerotic heart disease of native coronary artery without angina pectoris: Secondary | ICD-10-CM | POA: Diagnosis not present

## 2019-05-26 DIAGNOSIS — I130 Hypertensive heart and chronic kidney disease with heart failure and stage 1 through stage 4 chronic kidney disease, or unspecified chronic kidney disease: Secondary | ICD-10-CM | POA: Diagnosis not present

## 2019-05-26 DIAGNOSIS — Z0000 Encounter for general adult medical examination without abnormal findings: Secondary | ICD-10-CM | POA: Diagnosis not present

## 2019-05-26 DIAGNOSIS — K219 Gastro-esophageal reflux disease without esophagitis: Secondary | ICD-10-CM | POA: Diagnosis not present

## 2019-05-26 DIAGNOSIS — N1831 Chronic kidney disease, stage 3a: Secondary | ICD-10-CM | POA: Diagnosis not present

## 2019-05-26 DIAGNOSIS — R6889 Other general symptoms and signs: Secondary | ICD-10-CM | POA: Diagnosis not present

## 2019-05-26 DIAGNOSIS — E7800 Pure hypercholesterolemia, unspecified: Secondary | ICD-10-CM | POA: Diagnosis not present

## 2019-05-26 DIAGNOSIS — K5190 Ulcerative colitis, unspecified, without complications: Secondary | ICD-10-CM | POA: Diagnosis not present

## 2019-05-26 DIAGNOSIS — Z125 Encounter for screening for malignant neoplasm of prostate: Secondary | ICD-10-CM | POA: Diagnosis not present

## 2019-05-26 DIAGNOSIS — R7301 Impaired fasting glucose: Secondary | ICD-10-CM | POA: Diagnosis not present

## 2019-05-26 DIAGNOSIS — M109 Gout, unspecified: Secondary | ICD-10-CM | POA: Diagnosis not present

## 2019-06-02 MED FILL — predniSONE 20 MG TABS: 20 | 30 days supply | Qty: 30 | Fill #0

## 2019-06-10 ENCOUNTER — Ambulatory Visit: Payer: 59 | Admitting: Cardiology

## 2019-06-11 DIAGNOSIS — K5190 Ulcerative colitis, unspecified, without complications: Secondary | ICD-10-CM | POA: Diagnosis not present

## 2019-06-11 MED FILL — predniSONE 5 MG TABS: 5 | 30 days supply | Qty: 30 | Fill #0

## 2019-06-20 MED FILL — VIIBRYD 40 MG TABLET: 40 | 90 days supply | Qty: 90 | Fill #0

## 2019-06-20 MED FILL — ESOMEPRAZOLE MAG DR 40 MG C: 40 | 90 days supply | Qty: 180 | Fill #0

## 2019-06-30 ENCOUNTER — Other Ambulatory Visit: Payer: Self-pay | Admitting: Cardiology

## 2019-07-11 ENCOUNTER — Other Ambulatory Visit: Payer: Self-pay | Admitting: Cardiology

## 2019-07-14 ENCOUNTER — Other Ambulatory Visit: Payer: Self-pay | Admitting: Cardiology

## 2019-07-14 MED FILL — ROSUVASTATIN CALCIUM 40 MG: 40 | 90 days supply | Qty: 90 | Fill #0

## 2019-07-14 MED FILL — CLOPIDOGREL 75 MG TABLET: 75 | 90 days supply | Qty: 90 | Fill #0

## 2019-07-31 DIAGNOSIS — M79674 Pain in right toe(s): Secondary | ICD-10-CM | POA: Diagnosis not present

## 2019-08-11 ENCOUNTER — Other Ambulatory Visit (HOSPITAL_COMMUNITY): Payer: Self-pay | Admitting: Gastroenterology

## 2019-08-11 MED FILL — LIALDA 1.2 GM TABLET SA: 1.2 | 30 days supply | Qty: 120 | Fill #0

## 2019-08-18 ENCOUNTER — Telehealth: Payer: Self-pay | Admitting: Cardiology

## 2019-08-18 NOTE — Telephone Encounter (Signed)
Returned the call to the patient. He stated that for a week now he has been having shortness of breath on exertion. This does not occur all the time. He has also been having off and on again palpitations that last a few seconds when they occur. He had them previously in the past and was on atenolol which was effective.   He was in [place] when the symptoms started and they have progressed as he has been home. He denies swelling and chest pain. Denies a fever. Recent Covid test was negative.   He stated that he does have allergies and wondered if part of the shortness of breath is from the sinuses.   Blood pressure while on the phone was 132/79 and heart rate was 91.  Appointment made for 5/26 with Kerin Ransom, [place]. He has been advised to call back if he has worsening symptoms or go to an Urgent Care.

## 2019-08-18 NOTE — Telephone Encounter (Signed)
Pt c/o Shortness Of Breath: STAT if SOB developed within the last 24 hours or pt is noticeably SOB on the phone  1. Are you currently SOB (can you hear that pt is SOB on the phone)?   2. How long have you been experiencing SOB? Last Tuesday 08/12/19  3. Are you SOB when sitting or when up moving around? both  4. Are you currently experiencing any other symptoms? Chest heaviness, choking feeling and palpitations.   Patient c/o Palpitations:  High priority if patient c/o lightheadedness, shortness of breath, or chest pain  1) How long have you had palpitations/irregular HR/ Afib? Are you having the symptoms now? palpitations  2) Are you currently experiencing lightheadedness, SOB or CP? no  3) Do you have a history of afib (atrial fibrillation) or irregular heart rhythm? Not sure  4) Have you checked your BP or HR? (document readings if available): no - will have ready when the nurse calls   5) Are you experiencing any other symptoms? Does get lightheaded when they happen as well as SOB and chest heaviness.  Scheduled patient for 08/29/19 with Roby Lofts. She did not feel like a virtual appt would be beneficial.

## 2019-08-20 ENCOUNTER — Ambulatory Visit: Payer: 59 | Admitting: Cardiology

## 2019-08-20 ENCOUNTER — Encounter: Payer: Self-pay | Admitting: Cardiology

## 2019-08-20 ENCOUNTER — Other Ambulatory Visit: Payer: Self-pay

## 2019-08-20 VITALS — BP 140/84 | HR 77 | Temp 97.3°F | Ht 71.0 in | Wt 207.0 lb

## 2019-08-20 DIAGNOSIS — R002 Palpitations: Secondary | ICD-10-CM

## 2019-08-20 DIAGNOSIS — I10 Essential (primary) hypertension: Secondary | ICD-10-CM

## 2019-08-20 DIAGNOSIS — Z9861 Coronary angioplasty status: Secondary | ICD-10-CM

## 2019-08-20 DIAGNOSIS — R0602 Shortness of breath: Secondary | ICD-10-CM | POA: Diagnosis not present

## 2019-08-20 DIAGNOSIS — K51919 Ulcerative colitis, unspecified with unspecified complications: Secondary | ICD-10-CM

## 2019-08-20 DIAGNOSIS — R079 Chest pain, unspecified: Secondary | ICD-10-CM

## 2019-08-20 DIAGNOSIS — I2510 Atherosclerotic heart disease of native coronary artery without angina pectoris: Secondary | ICD-10-CM

## 2019-08-20 DIAGNOSIS — E785 Hyperlipidemia, unspecified: Secondary | ICD-10-CM | POA: Diagnosis not present

## 2019-08-20 MED ORDER — ISOSORBIDE MONONITRATE ER 30 MG PO TB24
30.0000 mg | ORAL_TABLET | Freq: Every day | ORAL | 1 refills | Status: DC
Start: 2019-08-20 — End: 2019-09-09

## 2019-08-20 MED ORDER — NITROGLYCERIN 0.4 MG SL SUBL: 0.4000 mg | SUBLINGUAL_TABLET | SUBLINGUAL | 4 refills | Status: DC | PRN

## 2019-08-20 MED FILL — NITROGLYCERIN 0.4 MG TAB SL: 0.4 | 5 days supply | Qty: 25 | Fill #0

## 2019-08-20 MED FILL — ISOSORBIDE MN ER 30 MG TAB: 30 | 90 days supply | Qty: 90 | Fill #0

## 2019-08-20 NOTE — Assessment & Plan Note (Signed)
On Mesalamine

## 2019-08-20 NOTE — Assessment & Plan Note (Signed)
Reviewed with Dr Nicholos Johns diagnostic cath. Add Imdur 30 mg, NTG SL PRN.  He is not on ASA secondary to ulcerative colitis

## 2019-08-20 NOTE — Assessment & Plan Note (Signed)
PCI to RCA with tandem BMS (PS 1535); 2002: Cutting Balloon atherectomy for ISR

## 2019-08-20 NOTE — H&P (View-Only) (Signed)
Cardiology Office Note:    Date:  08/20/2019   ID:  CHAU SAWIN, DOB 09/29/1965, MRN [id number]  PCP:  [name], MD  Cardiologist:  Glenetta Hew, MD  Electrophysiologist:  None   Referring MD: [name], MD   Chief Complaint  Patient presents with  . Follow-up  . Palpitations  . Shortness of Breath  . Chest Pain  . Headache    History of Present Illness:    Daniel Buck is a 54 y.o. male with a hx of early CAD.  He had a remote inferior MI in 1996 treated with RCA BMS.  He had in-stent restenosis treated with atherectomy in 2002.  His last functional study was a nuclear stress in 2012 and this was low risk.  He has done well from a cardiac standpoint since.  He lives on a farm in [place], he lives near where Dr. Rollene Fare keeps his horses.  He was contacted last  05/06/2019 for preoperative clearance prior to inguinal hernia repair. He was cleared and tolerated this well.  Last week he was on a hunting trip in the [place] of [place] at [employer] for [place].  During the few days he was there he noted palpitations (skipped beats) and DOE. He returned home a few days ago (he has his negative COVID test results) and has continued to have DOE.  Today he was moving furniture and had SOB and became sweaty.  He also noted some throat pain similar to his pre PCI symptoms.  At rest he is pain free.   Past Medical History:  Diagnosis Date  . CAD S/P percutaneous coronary angioplasty 1996, 2002   a. s/p Inf MI (age 27) >>PCI to RCA with tandem BMS (PS 1535); b. LHC 06/2000: pRCA 30-40%, prox/mid stents with 75% ISR, 30-40% at crux in RCA >> PCI: Cutting Balloon atherectomy for ISR; c. 2012 MV: inf ischemia, no scar.  . Carpal tunnel syndrome on left 12/2011  . Chronic nasal congestion   . Depression 01/19/2012  . GERD (gastroesophageal reflux disease)   . Gout   . History of Doppler ultrasound    a. Carotid US 3/11: no ICA stenosis   . History of echocardiogram    a. Echo  6/14: mod LVH, EF 50-55%, inf-lat HK, Gr 1 DD, mild LAE  . Hyperlipidemia LDL goal < 70   . Hypertension    under control; has been on med. since 1996  . Myocardial infarct, old 1996   PCI  . Ulcerative colitis [facility])     Past Surgical History:  Procedure Laterality Date  . CARPAL TUNNEL RELEASE  01/26/2012   Procedure: CARPAL TUNNEL RELEASE;  Surgeon: Cammie Sickle., MD;  Location: [facility] [facility];  Service: Orthopedics;  Laterality: Left;  . CORONARY ANGIOPLASTY  07/11/2000   Cutting Balloon PTCA for RCA ISR  . CORONARY STENT PLACEMENT  1996   [id number] BMS x 2 in RCA  . CYSTECTOMY  as a child   from neck  . NM MYOVIEW LTD  December 2012   Inferior infarct but no ischemia  . TONSILLECTOMY  as a child  . TRANSTHORACIC ECHOCARDIOGRAM  08/2017   Normal echo-normal LV size and function.  EF 55-60%.  No R WMA.  No ASD/PFO.  No valve lesions.    Current Medications: Current Meds  Medication Sig  . clopidogrel (PLAVIX) 75 MG tablet TAKE 1 TABLET BY MOUTH DAILY.  [name] esomeprazole (NEXIUM) 40 MG capsule Take 40 mg  by mouth daily.   [name] LIALDA 1.2 g EC tablet Take 1.2 g by mouth daily with breakfast.   . nitroGLYCERIN (NITROSTAT) 0.4 MG SL tablet Place 1 tablet (0.4 mg total) under the tongue every 5 (five) minutes as needed for chest pain.  . rosuvastatin (CRESTOR) 40 MG tablet TAKE 1 TABLET (40 MG TOTAL) BY MOUTH DAILY.  . Vilazodone HCl (VIIBRYD) 40 MG TABS Take 40 mg by mouth daily.  . [DISCONTINUED] nitroGLYCERIN (NITROSTAT) 0.4 MG SL tablet Place 1 tablet (0.4 mg total) under the tongue every 5 (five) minutes as needed for chest pain.     Allergies:   Patient has no known allergies.   Social History   Socioeconomic History  . Marital status: Married    Spouse name: Not on file  . Number of children: Not on file  . Years of education: Not on file  . Highest education level: Not on file  Occupational History  . Not on file  Tobacco Use  . Smoking status: Former  Smoker    Quit date: 03/27/1992    Years since quitting: 27.4  . Smokeless tobacco: Current User    Types: Snuff, Chew  Substance and Sexual Activity  . Alcohol use: No  . Drug use: No  . Sexual activity: Not on file  Other Topics Concern  . Not on file  Social History Narrative   He lives in [place], [place] near [place] with his wife. He lives on a small farm. He is extremely active around the farm, but does not have a routine exercise regimen.   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   He quit smoking in 1994.   Social Determinants of Health   Financial Resource Strain:   . Difficulty of Paying Living Expenses:   Food Insecurity:   . Worried About Charity fundraiser in the Last Year:   . Arboriculturist in the Last Year:   Transportation Needs:   . Film/video editor (Medical):   [name] Lack of Transportation (Non-Medical):   Physical Activity:   . Days of Exercise per Week:   . Minutes of Exercise per Session:   Stress:   . Feeling of Stress :   Social Connections:   . Frequency of Communication with Friends and Family:   . Frequency of Social Gatherings with Friends and Family:   . Attends Religious Services:   . Active Member of Clubs or Organizations:   . Attends Archivist Meetings:   [name] Marital Status:      Family History: The patient's family history includes AAA (abdominal aortic aneurysm) in his father; Aortic stenosis in his mother; Atrial fibrillation in his father; CAD (age of onset: 67) in his father.  ROS:   Please see the history of present illness.    All other systems reviewed and are negative.  EKGs/Labs/Other Studies Reviewed:    The following studies were reviewed today: Echo June 2019- Study Conclusions   - Left ventricle: The cavity size was normal. Systolic function was  normal. The estimated ejection fraction was in the range of 55%  to 60%. Wall motion was normal; there were no regional wall  motion abnormalities. Left  ventricular diastolic function  parameters were normal.  - Atrial septum: No defect or patent foramen ovale was identified.   EKG:  EKG is ordered today.  The ekg ordered today demonstratesNSR, HR 77, LAD   Recent Labs: No results found for requested  labs within last 8760 hours.  Recent Lipid Panel    Component Value Date/Time   CHOL 224 (H) 08/21/2017 1236   TRIG 263 (H) 08/21/2017 1236   HDL 40 08/21/2017 1236   CHOLHDL 5.6 (H) 08/21/2017 1236   CHOLHDL 6.6 06/14/2016 0822   VLDL 25 06/14/2016 0822   LDLCALC 131 (H) 08/21/2017 1236    Physical Exam:    VS:  BP 140/84 (BP Location: Left Arm, Patient Position: Sitting, Cuff Size: Normal)   Pulse 77   Temp (!) 97.3 F (36.3 C)   Ht 5' 11"  (1.803 m)   Wt 207 lb (93.9 kg)   BMI 28.87 kg/m     Wt Readings from Last 3 Encounters:  08/20/19 207 lb (93.9 kg)  05/06/19 198 lb (89.8 kg)  01/02/18 200 lb (90.7 kg)     GEN:  Well nourished, well developed in no acute distress HEENT: Normal NECK: No JVD; No carotid bruits CARDIAC: RRR, no murmurs, rubs, gallops RESPIRATORY:  Clear to auscultation without rales, wheezing or rhonchi  ABDOMEN: Soft, non-tender, non-distended MUSCULOSKELETAL:  No edema; No deformity  SKIN: Warm and dry NEUROLOGIC:  Alert and oriented x 3 PSYCHIATRIC:  Normal affect   ASSESSMENT:    Chest pain with moderate risk of acute coronary syndrome Reviewed with Dr Nicholos Johns diagnostic cath. Add Imdur 30 mg, NTG SL PRN.  He is not on ASA secondary to ulcerative colitis  CAD S/P percutaneous coronary angioplasty PCI to RCA with tandem BMS (PS 1535); 2002: Cutting Balloon atherectomy for ISR  Ulcerative colitis ([place]) On Mesalamine  Hyperlipidemia LDL goal <70 On Crestor 40 mg   PLAN:    Plan cath Friday- The patient understands that risks included but are not limited to stroke (1 in 1000), death (1 in 30), kidney failure [usually temporary] (1 in 500), bleeding (1 in 200), allergic reaction  [possibly serious] (1 in 200).  The patient understands and agrees to proceed. Add Imdur 30 mg and SL NTG.    Medication Adjustments/Labs and Tests Ordered: Current medicines are reviewed at length with the patient today.  Concerns regarding medicines are outlined above.  Orders Placed This Encounter  Procedures  . Basic metabolic panel  . CBC  . EKG 12-Lead   Meds ordered this encounter  Medications  . nitroGLYCERIN (NITROSTAT) 0.4 MG SL tablet    Sig: Place 1 tablet (0.4 mg total) under the tongue every 5 (five) minutes as needed for chest pain.    Dispense:  25 tablet    Refill:  4  . isosorbide mononitrate (IMDUR) 30 MG 24 hr tablet    Sig: Take 1 tablet (30 mg total) by mouth daily.    Dispense:  90 tablet    Refill:  1    Patient Instructions     [place] [address] [place] [address] Dept: [phone] Loc: [phone]  [place] C Gerwig  08/20/2019  You are scheduled for a Cardiac Catheterization on Friday, May 28 with Dr. Glenetta Hew.  1. Please arrive at the [facility] (Main Entrance A) at [facility]: [address] [place], [place] [address] at 8:30 AM (This time is two hours before your procedure to ensure your preparation). Free valet parking service is available.   Special note: Every effort is made to have your procedure done on time. Please understand that emergencies sometimes delay scheduled procedures.  2. Diet: Do not eat solid foods  after midnight.  The patient may have clear liquids until 5am upon the day of the procedure.  3. Labs: Done today, 08/20/19  4. Medication instructions in preparation for your procedure:  On the morning of your procedure, take your Plavix/Clopidogrel and any morning medicines NOT listed above.  You may use sips of water.  5. Plan for one night stay--bring personal belongings. 6. Bring a current list of your  medications and current insurance cards. 7. You MUST have a responsible person to drive you home. 8. Someone MUST be with you the first 24 hours after you arrive home or your discharge will be delayed. 9. Please wear clothes that are easy to get on and off and wear slip-on shoes.  Thank you for allowing [place] to care for you!   -- [place] [facility]   Follow-Up: At [facility], you and your health needs are our priority.  As part of our continuing mission to provide you with exceptional heart care, we have created designated Provider Care Teams.  These Care Teams include your primary Cardiologist (physician) and Advanced Practice Providers (APPs -  Physician Assistants and Nurse Practitioners) who all work together to provide you with the care you need, when you need it.  We recommend signing up for the patient portal called "MyChart".  Sign up information is provided on this After Visit Summary.  MyChart is used to connect with patients for Virtual Visits (Telemedicine).  Patients are able to view lab/test results, encounter notes, upcoming appointments, etc.  Non-urgent messages can be sent to your provider as well.   To learn more about what you can do with MyChart, go to [email or web address].    Your next appointment:   4-6 week(s)  The format for your next appointment:   In Person  Provider:   You may see Glenetta Hew, MD or one of the following Advanced Practice Providers on your designated Care Team:    Kerin Ransom, PA-C        Signed, Kerin Ransom, [place]  08/20/2019 4:20 PM    [place]

## 2019-08-20 NOTE — Patient Instructions (Signed)
    Daniel Buck [place] [address] Dept: [phone] Loc: [facility]  08/20/2019  You are scheduled for a Cardiac Catheterization on Friday, May 28 with Dr. Glenetta Hew.  1. Please arrive at the [facility] (Main Entrance A) at [facility]: [address] [place], [place] [address] at 8:30 AM (This time is two hours before your procedure to ensure your preparation). Free valet parking service is available.   Special note: Every effort is made to have your procedure done on time. Please understand that emergencies sometimes delay scheduled procedures.  2. Diet: Do not eat solid foods after midnight.  The patient may have clear liquids until 5am upon the day of the procedure.  3. Labs: Done today, 08/20/19  4. Medication instructions in preparation for your procedure:  On the morning of your procedure, take your Plavix/Clopidogrel and any morning medicines NOT listed above.  You may use sips of water.  5. Plan for one night stay--bring personal belongings. 6. Bring a current list of your medications and current insurance cards. 7. You MUST have a responsible person to drive you home. 8. Someone MUST be with you the first 24 hours after you arrive home or your discharge will be delayed. 9. Please wear clothes that are easy to get on and off and wear slip-on shoes.  Thank you for allowing [place] to care for you!   -- [name] [facility]   Follow-Up: At [facility], you and your health needs are our priority.  As part of our continuing mission to provide you with exceptional heart care, we have created designated Provider Care Teams.  These Care Teams include your primary Cardiologist (physician) and Advanced Practice Providers (APPs -  Physician Assistants and Nurse Practitioners) who all work together to provide you with the care you  need, when you need it.  We recommend signing up for the patient portal called "MyChart".  Sign up information is provided on this After Visit Summary.  MyChart is used to connect with patients for Virtual Visits (Telemedicine).  Patients are able to view lab/test results, encounter notes, upcoming appointments, etc.  Non-urgent messages can be sent to your provider as well.   To learn more about what you can do with MyChart, go to [email or web address].    Your next appointment:   4-6 week(s)  The format for your next appointment:   In Person  Provider:   You may see Glenetta Hew, MD or one of the following Advanced Practice Providers on your designated Care Team:    Kerin Ransom, [place]

## 2019-08-20 NOTE — Assessment & Plan Note (Signed)
On Crestor 40 mg

## 2019-08-20 NOTE — Progress Notes (Signed)
Cardiology Office Note:    Date:  08/20/2019   ID:  Daniel Buck, DOB [birth year], MRN [id number]  PCP:  [name], MD  Cardiologist:  Glenetta Hew, MD  Electrophysiologist:  None   Referring MD: [name], MD   Chief Complaint  Patient presents with  . Follow-up  . Palpitations  . Shortness of Breath  . Chest Pain  . Headache    History of Present Illness:    Daniel Buck is a 54 y.o. male with a hx of early CAD.  He had a remote inferior MI in 1996 treated with RCA BMS.  He had in-stent restenosis treated with atherectomy in 2002.  His last functional study was a nuclear stress in 2012 and this was low risk.  He has done well from a cardiac standpoint since.  [place] farm in [place], he lives near where Dr. Rollene Fare keeps his horses.  He was contacted last  05/06/2019 for preoperative clearance prior to inguinal hernia repair. He was cleared and tolerated this well.  Last week he was on a [place] trip in the [place] at [employer] for [place].  During the few days he was there he noted palpitations (skipped beats) and DOE. He returned home a few days ago (he has his negative COVID test results) and has continued to have DOE.  Today he was moving furniture and had SOB and became sweaty.  He also noted some throat pain similar to his pre PCI symptoms.  At rest he is pain free.   Past Medical History:  Diagnosis Date  . CAD S/P percutaneous coronary angioplasty 1996, 2002   a. s/p Inf MI (age 84) >>PCI to RCA with tandem BMS (PS 1535); b. LHC 06/2000: pRCA 30-40%, prox/mid stents with 75% ISR, 30-40% at crux in RCA >> PCI: Cutting Balloon atherectomy for ISR; c. 2012 MV: inf ischemia, no scar.  . Carpal tunnel syndrome on left 12/2011  . Chronic nasal congestion   . Depression 01/19/2012  . GERD (gastroesophageal reflux disease)   . Gout   . History of Doppler ultrasound    a. Carotid US 3/11: no ICA stenosis   . History of echocardiogram    a. Echo  6/14: mod LVH, EF 50-55%, inf-lat HK, Gr 1 DD, mild LAE  . Hyperlipidemia LDL goal < 70   . Hypertension    under control; has been on med. since 1996  . Myocardial infarct, old 1996   PCI  . Ulcerative colitis [facility])     Past Surgical History:  Procedure Laterality Date  . CARPAL TUNNEL RELEASE  01/26/2012   Procedure: CARPAL TUNNEL RELEASE;  Surgeon: Cammie Sickle., MD;  Location: [facility];  Service: Orthopedics;  Laterality: Left;  . CORONARY ANGIOPLASTY  07/11/2000   Cutting Balloon PTCA for RCA ISR  . CORONARY STENT PLACEMENT  1996   [id number] BMS x 2 in RCA  . CYSTECTOMY  as a child   from neck  . NM MYOVIEW LTD  December 2012   Inferior infarct but no ischemia  . TONSILLECTOMY  as a child  . TRANSTHORACIC ECHOCARDIOGRAM  08/2017   Normal echo-normal LV size and function.  EF 55-60%.  No R WMA.  No ASD/PFO.  No valve lesions.    Current Medications: Current Meds  Medication Sig  . clopidogrel (PLAVIX) 75 MG tablet TAKE 1 TABLET BY MOUTH DAILY.  [name] esomeprazole (NEXIUM) 40 MG capsule Take 40 mg  by mouth daily.   [name] LIALDA 1.2 g EC tablet Take 1.2 g by mouth daily with breakfast.   . nitroGLYCERIN (NITROSTAT) 0.4 MG SL tablet Place 1 tablet (0.4 mg total) under the tongue every 5 (five) minutes as needed for chest pain.  . rosuvastatin (CRESTOR) 40 MG tablet TAKE 1 TABLET (40 MG TOTAL) BY MOUTH DAILY.  . Vilazodone HCl (VIIBRYD) 40 MG TABS Take 40 mg by mouth daily.  . [DISCONTINUED] nitroGLYCERIN (NITROSTAT) 0.4 MG SL tablet Place 1 tablet (0.4 mg total) under the tongue every 5 (five) minutes as needed for chest pain.     Allergies:   Patient has no known allergies.   Social History   Socioeconomic History  . Marital status: Married    Spouse name: Not on file  . Number of children: Not on file  . Years of education: Not on file  . Highest education level: Not on file  Occupational History  . Not on file  Tobacco Use  . Smoking status: Former  Smoker    Quit date: 03/27/1992    Years since quitting: 27.4  . Smokeless tobacco: Current User    Types: Snuff, Chew  Substance and Sexual Activity  . Alcohol use: No  . Drug use: No  . Sexual activity: Not on file  Other Topics Concern  . Not on file  Social History Narrative   He lives in [place], [place] near [place] with his wife. He lives on a small farm. He is extremely active around the farm, but does not have a routine exercise regimen.   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   He quit smoking in 1994.   Social Determinants of Health   Financial Resource Strain:   . Difficulty of Paying Living Expenses:   Food Insecurity:   . Worried About Charity fundraiser in the Last Year:   . Arboriculturist in the Last Year:   Transportation Needs:   . Film/video editor (Medical):   [name] Lack of Transportation (Non-Medical):   Physical Activity:   . Days of Exercise per Week:   . Minutes of Exercise per Session:   Stress:   . Feeling of Stress :   Social Connections:   . Frequency of Communication with Friends and Family:   . Frequency of Social Gatherings with Friends and Family:   . Attends Religious Services:   . Active Member of Clubs or Organizations:   . Attends Archivist Meetings:   [name] Marital Status:      Family History: The patient's family history includes AAA (abdominal aortic aneurysm) in his father; Aortic stenosis in his mother; Atrial fibrillation in his father; CAD (age of onset: 43) in his father.  ROS:   Please see the history of present illness.    All other systems reviewed and are negative.  EKGs/Labs/Other Studies Reviewed:    The following studies were reviewed today: Echo June 2019- Study Conclusions   - Left ventricle: The cavity size was normal. Systolic function was  normal. The estimated ejection fraction was in the range of 55%  to 60%. Wall motion was normal; there were no regional wall  motion abnormalities. Left  ventricular diastolic function  parameters were normal.  - Atrial septum: No defect or patent foramen ovale was identified.   EKG:  EKG is ordered today.  The ekg ordered today demonstratesNSR, HR 77, LAD   Recent Labs: No results found for requested  labs within last 8760 hours.  Recent Lipid Panel    Component Value Date/Time   CHOL 224 (H) 08/21/2017 1236   TRIG 263 (H) 08/21/2017 1236   HDL 40 08/21/2017 1236   CHOLHDL 5.6 (H) 08/21/2017 1236   CHOLHDL 6.6 06/14/2016 0822   VLDL 25 06/14/2016 0822   LDLCALC 131 (H) 08/21/2017 1236    Physical Exam:    VS:  BP 140/84 (BP Location: Left Arm, Patient Position: Sitting, Cuff Size: Normal)   Pulse 77   Temp (!) 97.3 F (36.3 C)   Ht 5' 11"  (1.803 m)   Wt 207 lb (93.9 kg)   BMI 28.87 kg/m     Wt Readings from Last 3 Encounters:  08/20/19 207 lb (93.9 kg)  05/06/19 198 lb (89.8 kg)  01/02/18 200 lb (90.7 kg)     GEN:  Well nourished, well developed in no acute distress HEENT: Normal NECK: No JVD; No carotid bruits CARDIAC: RRR, no murmurs, rubs, gallops RESPIRATORY:  Clear to auscultation without rales, wheezing or rhonchi  ABDOMEN: Soft, non-tender, non-distended MUSCULOSKELETAL:  No edema; No deformity  SKIN: Warm and dry NEUROLOGIC:  Alert and oriented x 3 PSYCHIATRIC:  Normal affect   ASSESSMENT:    Chest pain with moderate risk of acute coronary syndrome Reviewed with Dr Nicholos Johns diagnostic cath. Add Imdur 30 mg, NTG SL PRN.  He is not on ASA secondary to ulcerative colitis  CAD S/P percutaneous coronary angioplasty PCI to RCA with tandem BMS (PS 1535); 2002: Cutting Balloon atherectomy for ISR  Ulcerative colitis ([place]) On Mesalamine  Hyperlipidemia LDL goal <70 On Crestor 40 mg   PLAN:    Plan cath Friday- The patient understands that risks included but are not limited to stroke (1 in 1000), death (1 in 80), kidney failure [usually temporary] (1 in 500), bleeding (1 in 200), allergic reaction  [possibly serious] (1 in 200).  The patient understands and agrees to proceed. Add Imdur 30 mg and SL NTG.    Medication Adjustments/Labs and Tests Ordered: Current medicines are reviewed at length with the patient today.  Concerns regarding medicines are outlined above.  Orders Placed This Encounter  Procedures  . Basic metabolic panel  . CBC  . EKG 12-Lead   Meds ordered this encounter  Medications  . nitroGLYCERIN (NITROSTAT) 0.4 MG SL tablet    Sig: Place 1 tablet (0.4 mg total) under the tongue every 5 (five) minutes as needed for chest pain.    Dispense:  25 tablet    Refill:  4  . isosorbide mononitrate (IMDUR) 30 MG 24 hr tablet    Sig: Take 1 tablet (30 mg total) by mouth daily.    Dispense:  90 tablet    Refill:  1    Patient Instructions     [facility] [address] [place] [address] Dept: [phone] Loc: [phone]  Damare C Shearon  08/20/2019  You are scheduled for a Cardiac Catheterization on Friday, May 28 with Dr. Glenetta Hew.  1. Please arrive at the [facility] (Main Entrance A) at [facility]: [address] [place], [place] [address] at 8:30 AM (This time is two hours before your procedure to ensure your preparation). Free valet parking service is available.   Special note: Every effort is made to have your procedure done on time. Please understand that emergencies sometimes delay scheduled procedures.  2. Diet: Do not eat solid foods  after midnight.  The patient may have clear liquids until 5am upon the day of the procedure.  3. Labs: Done today, 08/20/19  4. Medication instructions in preparation for your procedure:  On the morning of your procedure, take your Plavix/Clopidogrel and any morning medicines NOT listed above.  You may use sips of water.  5. Plan for one night stay--bring personal belongings. 6. Bring a current list of your  medications and current insurance cards. 7. You MUST have a responsible person to drive you home. 8. Someone MUST be with you the first 24 hours after you arrive home or your discharge will be delayed. 9. Please wear clothes that are easy to get on and off and wear slip-on shoes.  Thank you for allowing [place] to care for you!   -- [facility]   Follow-Up: At [facility], you and your health needs are our priority.  As part of our continuing mission to provide you with exceptional heart care, we have created designated Provider Care Teams.  These Care Teams include your primary Cardiologist (physician) and Advanced Practice Providers (APPs -  Physician Assistants and Nurse Practitioners) who all work together to provide you with the care you need, when you need it.  We recommend signing up for the patient portal called "MyChart".  Sign up information is provided on this After Visit Summary.  MyChart is used to connect with patients for Virtual Visits (Telemedicine).  Patients are able to view lab/test results, encounter notes, upcoming appointments, etc.  Non-urgent messages can be sent to your provider as well.   To learn more about what you can do with MyChart, go to [email or web address].    Your next appointment:   4-6 week(s)  The format for your next appointment:   In Person  Provider:   You may see Glenetta Hew, MD or one of the following Advanced Practice Providers on your designated Care Team:    Kerin Ransom, PA-C        Signed, Kerin Ransom, [place]  08/20/2019 4:20 PM    [place]

## 2019-08-21 ENCOUNTER — Other Ambulatory Visit (HOSPITAL_COMMUNITY)
Admission: RE | Admit: 2019-08-21 | Discharge: 2019-08-21 | Disposition: A | Discharge status: Home / Self Care | Payer: 59 | Source: Ambulatory Visit | Attending: Cardiology | Admitting: Cardiology

## 2019-08-21 ENCOUNTER — Telehealth: Payer: Self-pay | Admitting: Cardiology

## 2019-08-21 DIAGNOSIS — Z20822 Contact with and (suspected) exposure to covid-19: Secondary | ICD-10-CM | POA: Insufficient documentation

## 2019-08-21 DIAGNOSIS — Z01812 Encounter for preprocedural laboratory examination: Secondary | ICD-10-CM | POA: Diagnosis not present

## 2019-08-21 LAB — CBC
Hematocrit: 48.5 % (ref 37.5–51.0)
Hemoglobin: 16.2 g/dL (ref 13.0–17.7)
MCH: 29.5 pg (ref 26.6–33.0)
MCHC: 33.4 g/dL (ref 31.5–35.7)
MCV: 88 fL (ref 79–97)
Platelets: 254 10*3/uL (ref 150–450)
RBC: 5.5 x10E6/uL (ref 4.14–5.80)
RDW: 13.1 % (ref 11.6–15.4)
WBC: 8.8 10*3/uL (ref 3.4–10.8)

## 2019-08-21 LAB — BASIC METABOLIC PANEL
BUN/Creatinine Ratio: 13 (ref 9–20)
BUN: 17 mg/dL (ref 6–24)
CO2: 22 mmol/L (ref 20–29)
Calcium: 10.3 mg/dL — ABNORMAL HIGH (ref 8.7–10.2)
Chloride: 106 mmol/L (ref 96–106)
Creatinine, Ser: 1.28 mg/dL — ABNORMAL HIGH (ref 0.76–1.27)
GFR calc Af Amer: 73 mL/min/{1.73_m2} (ref >59)
GFR calc non Af Amer: 63 mL/min/{1.73_m2} (ref >59)
Glucose: 75 mg/dL (ref 65–99)
Potassium: 4.4 mmol/L (ref 3.5–5.2)
Sodium: 145 mmol/L — ABNORMAL HIGH (ref 134–144)

## 2019-08-21 LAB — SARS CORONAVIRUS 2 (TAT 6-24 HRS): SARS Coronavirus 2: NEGATIVE

## 2019-08-21 NOTE — Telephone Encounter (Signed)
Spoke to pt wife--OK per DPR. Informed her that I received a message this morning from Kathlene Cote saying COVID test needs to be done within 4 days of procedure. (Pt had a negative COVID test done Friday 5/21). I called Rennis Harding at [facility] lab who said they would accept negative covid test done last Friday if the patient had been in quarantine. Pt wife stated he had not been in quarantine.  I asked pt wife if he would be able to have test done today. Pt wife asked where he can have Rapid Test done.   Because pt lives in [place], I called [facility] to find out if they are doing Rapid tests. They said they do not and the site across form the hospital does not open until 2 pm.   I called pt wife back to inform her and she stated she will be able to bring pt to [facility] testing site today for Rapid test.  Apologized to pt wife for the inconvenience. She was very nice and understood.

## 2019-08-22 ENCOUNTER — Ambulatory Visit (HOSPITAL_COMMUNITY)
Admission: RE | Admit: 2019-08-22 | Discharge: 2019-08-22 | Disposition: A | Discharge status: Home / Self Care | Payer: 59 | Attending: Interventional Cardiology | Admitting: Interventional Cardiology

## 2019-08-22 ENCOUNTER — Encounter (HOSPITAL_COMMUNITY)
Admission: RE | Disposition: A | Discharge status: Home / Self Care | Payer: Self-pay | Source: Home / Self Care | Attending: Interventional Cardiology

## 2019-08-22 ENCOUNTER — Other Ambulatory Visit: Payer: Self-pay

## 2019-08-22 DIAGNOSIS — F329 Major depressive disorder, single episode, unspecified: Secondary | ICD-10-CM | POA: Insufficient documentation

## 2019-08-22 DIAGNOSIS — I252 Old myocardial infarction: Secondary | ICD-10-CM | POA: Insufficient documentation

## 2019-08-22 DIAGNOSIS — E785 Hyperlipidemia, unspecified: Secondary | ICD-10-CM | POA: Insufficient documentation

## 2019-08-22 DIAGNOSIS — M109 Gout, unspecified: Secondary | ICD-10-CM | POA: Insufficient documentation

## 2019-08-22 DIAGNOSIS — R079 Chest pain, unspecified: Secondary | ICD-10-CM | POA: Diagnosis present

## 2019-08-22 DIAGNOSIS — R0609 Other forms of dyspnea: Secondary | ICD-10-CM | POA: Insufficient documentation

## 2019-08-22 DIAGNOSIS — K5190 Ulcerative colitis, unspecified, without complications: Secondary | ICD-10-CM | POA: Diagnosis not present

## 2019-08-22 DIAGNOSIS — Z955 Presence of coronary angioplasty implant and graft: Secondary | ICD-10-CM | POA: Diagnosis not present

## 2019-08-22 DIAGNOSIS — R002 Palpitations: Secondary | ICD-10-CM | POA: Diagnosis present

## 2019-08-22 DIAGNOSIS — F17290 Nicotine dependence, other tobacco product, uncomplicated: Secondary | ICD-10-CM | POA: Diagnosis not present

## 2019-08-22 DIAGNOSIS — Z7902 Long term (current) use of antithrombotics/antiplatelets: Secondary | ICD-10-CM | POA: Diagnosis not present

## 2019-08-22 DIAGNOSIS — I2510 Atherosclerotic heart disease of native coronary artery without angina pectoris: Secondary | ICD-10-CM

## 2019-08-22 DIAGNOSIS — I25709 Atherosclerosis of coronary artery bypass graft(s), unspecified, with unspecified angina pectoris: Secondary | ICD-10-CM | POA: Insufficient documentation

## 2019-08-22 DIAGNOSIS — I10 Essential (primary) hypertension: Secondary | ICD-10-CM | POA: Insufficient documentation

## 2019-08-22 DIAGNOSIS — Z79899 Other long term (current) drug therapy: Secondary | ICD-10-CM | POA: Insufficient documentation

## 2019-08-22 DIAGNOSIS — Z9861 Coronary angioplasty status: Secondary | ICD-10-CM

## 2019-08-22 DIAGNOSIS — K219 Gastro-esophageal reflux disease without esophagitis: Secondary | ICD-10-CM | POA: Diagnosis not present

## 2019-08-22 HISTORY — PX: LEFT HEART CATH AND CORONARY ANGIOGRAPHY: CATH118249

## 2019-08-22 SURGERY — LEFT HEART CATH AND CORONARY ANGIOGRAPHY
Anesthesia: LOCAL

## 2019-08-22 MED ORDER — FENTANYL CITRATE (PF) 100 MCG/2ML IJ SOLN
INTRAMUSCULAR | Status: AC
  Filled 2019-08-22: qty 2

## 2019-08-22 MED ORDER — ACETAMINOPHEN 325 MG PO TABS: 650.0000 mg | ORAL_TABLET | ORAL | Status: DC | PRN

## 2019-08-22 MED ORDER — HEPARIN SODIUM (PORCINE) 1000 UNIT/ML IJ SOLN
INTRAMUSCULAR | Status: AC
  Filled 2019-08-22: qty 1

## 2019-08-22 MED ORDER — MIDAZOLAM HCL 2 MG/2ML IJ SOLN
INTRAMUSCULAR | Status: DC | PRN
  Administered 2019-08-22 (×2): 1 mg via INTRAVENOUS

## 2019-08-22 MED ORDER — IOHEXOL 350 MG/ML SOLN
INTRAVENOUS | Status: DC | PRN
  Administered 2019-08-22: 70 mL

## 2019-08-22 MED ORDER — SODIUM CHLORIDE 0.9% FLUSH: 3.0000 mL | INTRAVENOUS | Status: DC | PRN

## 2019-08-22 MED ORDER — ASPIRIN 81 MG PO CHEW
CHEWABLE_TABLET | ORAL | Status: AC
  Administered 2019-08-22: 81 mg via ORAL
  Filled 2019-08-22: qty 1

## 2019-08-22 MED ORDER — HEPARIN (PORCINE) IN NACL 1000-0.9 UT/500ML-% IV SOLN
INTRAVENOUS | Status: AC
  Filled 2019-08-22: qty 1000

## 2019-08-22 MED ORDER — FENTANYL CITRATE (PF) 100 MCG/2ML IJ SOLN
INTRAMUSCULAR | Status: DC | PRN
  Administered 2019-08-22: 25 ug via INTRAVENOUS

## 2019-08-22 MED ORDER — LIDOCAINE HCL (PF) 1 % IJ SOLN
INTRAMUSCULAR | Status: DC | PRN
  Administered 2019-08-22: 2 mL

## 2019-08-22 MED ORDER — SODIUM CHLORIDE 0.9 % IV SOLN: 250.0000 mL | INTRAVENOUS | Status: DC | PRN

## 2019-08-22 MED ORDER — LABETALOL HCL 5 MG/ML IV SOLN: 10.0000 mg | INTRAVENOUS | Status: DC | PRN

## 2019-08-22 MED ORDER — SODIUM CHLORIDE 0.9 % IV SOLN: INTRAVENOUS | Status: DC

## 2019-08-22 MED ORDER — HEPARIN SODIUM (PORCINE) 1000 UNIT/ML IJ SOLN
INTRAMUSCULAR | Status: DC | PRN
  Administered 2019-08-22: 4500 [IU] via INTRAVENOUS

## 2019-08-22 MED ORDER — SODIUM CHLORIDE 0.9 % WEIGHT BASED INFUSION: 1.0000 mL/kg/h | INTRAVENOUS | Status: DC

## 2019-08-22 MED ORDER — HEPARIN (PORCINE) IN NACL 1000-0.9 UT/500ML-% IV SOLN
INTRAVENOUS | Status: DC | PRN
  Administered 2019-08-22: 500 mL

## 2019-08-22 MED ORDER — VERAPAMIL HCL 2.5 MG/ML IV SOLN
INTRAVENOUS | Status: AC
  Filled 2019-08-22: qty 2

## 2019-08-22 MED ORDER — LIDOCAINE HCL (PF) 1 % IJ SOLN
INTRAMUSCULAR | Status: AC
  Filled 2019-08-22: qty 60

## 2019-08-22 MED ORDER — MIDAZOLAM HCL 2 MG/2ML IJ SOLN
INTRAMUSCULAR | Status: AC
  Filled 2019-08-22: qty 2

## 2019-08-22 MED ORDER — ONDANSETRON HCL 4 MG/2ML IJ SOLN: 4.0000 mg | Freq: Four times a day (QID) | INTRAMUSCULAR | Status: DC | PRN

## 2019-08-22 MED ORDER — ASPIRIN 81 MG PO CHEW: 81.0000 mg | CHEWABLE_TABLET | Freq: Every day | ORAL | Status: DC

## 2019-08-22 MED ORDER — SODIUM CHLORIDE 0.9% FLUSH: 3.0000 mL | Freq: Two times a day (BID) | INTRAVENOUS | Status: DC

## 2019-08-22 MED ORDER — VERAPAMIL HCL 2.5 MG/ML IV SOLN
INTRAVENOUS | Status: DC | PRN
  Administered 2019-08-22: 10 mL via INTRA_ARTERIAL

## 2019-08-22 MED ORDER — HYDRALAZINE HCL 20 MG/ML IJ SOLN: 10.0000 mg | INTRAMUSCULAR | Status: DC | PRN

## 2019-08-22 MED ORDER — SODIUM CHLORIDE 0.9 % WEIGHT BASED INFUSION
3.0000 mL/kg/h | INTRAVENOUS | Status: AC
  Administered 2019-08-22: 3 mL/kg/h via INTRAVENOUS

## 2019-08-22 MED ORDER — ASPIRIN 81 MG PO CHEW: 81.0000 mg | CHEWABLE_TABLET | ORAL | Status: AC

## 2019-08-22 SURGICAL SUPPLY — 11 items
CATH 5FR JL3.5 JR4 ANG PIG MP (CATHETERS) ×1 IMPLANT
CATH INFINITI 5FR MPB2 (CATHETERS) ×1 IMPLANT
DEVICE RAD COMP TR BAND LRG (VASCULAR PRODUCTS) ×2 IMPLANT
GLIDESHEATH SLEND A-KIT 6F 22G (SHEATH) ×1 IMPLANT
GUIDEWIRE INQWIRE 1.5J.035X260 (WIRE) IMPLANT
INQWIRE 1.5J .035X260CM (WIRE) ×2
KIT HEART LEFT (KITS) ×2 IMPLANT
PACK CARDIAC CATHETERIZATION (CUSTOM PROCEDURE TRAY) ×2 IMPLANT
SHEATH PROBE COVER 6X72 (BAG) ×1 IMPLANT
TRANSDUCER W/STOPCOCK (MISCELLANEOUS) ×2 IMPLANT
TUBING CIL FLEX 10 FLL-RA (TUBING) ×2 IMPLANT

## 2019-08-22 NOTE — CV Procedure (Signed)
   Coronary angiography, left heart cath, left ventriculography via right radial approach using real-time vascular ultrasound for guidance.  Eccentric shelflike 40 to 50% ostial left main followed by distal 50 to 70% left main depending upon the view.  First diagonal of the LAD contains ostial 75% stenosis.  Mid LAD is heavily calcified and contains segmental 60 to 70% narrowing.  Mid circumflex contains eccentric 70% mid vessel stenosis.  RCA is dominant, there is proximal tubular 90% stenosis.  There is diffuse in-stent restenosis up to 85%.  Moderate disease in the distal RCA.  Competitive flow noted in PDA suggesting left to right collaterals  Left ventricular function is normal.  LVEF 50%.  LVEDP is normal.

## 2019-08-22 NOTE — Discharge Instructions (Signed)
Radial Site Care  This sheet gives you information about how to care for yourself after your procedure. Your health care provider may also give you more specific instructions. If you have problems or questions, contact your health care provider. What can I expect after the procedure? After the procedure, it is common to have:  Bruising and tenderness at the catheter insertion area. Follow these instructions at home: Medicines  Take over-the-counter and prescription medicines only as told by your health care provider. Insertion site care  Follow instructions from your health care provider about how to take care of your insertion site. Make sure you: ? Wash your hands with soap and water before you change your bandage (dressing). If soap and water are not available, use hand sanitizer. ? Change your dressing as told by your health care provider. ? Leave stitches (sutures), skin glue, or adhesive strips in place. These skin closures may need to stay in place for 2 weeks or longer. If adhesive strip edges start to loosen and curl up, you may trim the loose edges. Do not remove adhesive strips completely unless your health care provider tells you to do that.  Check your insertion site every day for signs of infection. Check for: ? Redness, swelling, or pain. ? Fluid or blood. ? Pus or a bad smell. ? Warmth.  Do not take baths, swim, or use a hot tub until your health care provider approves.  You may shower 24-48 hours after the procedure, or as directed by your health care provider. ? Remove the dressing and gently wash the site with plain soap and water. ? Pat the area dry with a clean towel. ? Do not rub the site. That could cause bleeding.  Do not apply powder or lotion to the site. Activity   For 24 hours after the procedure, or as directed by your health care provider: ? Do not flex or bend the affected arm. ? Do not push or pull heavy objects with the affected arm. ? Do not  drive yourself home from the hospital or clinic. You may drive 24 hours after the procedure unless your health care provider tells you not to. ? Do not operate machinery or power tools.  Do not lift anything that is heavier than 10 lb (4.5 kg), or the limit that you are told, until your health care provider says that it is safe.  Ask your health care provider when it is okay to: ? Return to work or school. ? Resume usual physical activities or sports. ? Resume sexual activity. General instructions  If the catheter site starts to bleed, raise your arm and put firm pressure on the site. If the bleeding does not stop, get help right away. This is a medical emergency.  If you went home on the same day as your procedure, a responsible adult should be with you for the first 24 hours after you arrive home.  Keep all follow-up visits as told by your health care provider. This is important. Contact a health care provider if:  You have a fever.  You have redness, swelling, or yellow drainage around your insertion site. Get help right away if:  You have unusual pain at the radial site.  The catheter insertion area swells very fast.  The insertion area is bleeding, and the bleeding does not stop when you hold steady pressure on the area.  Your arm or hand becomes pale, cool, tingly, or numb. These symptoms may represent a serious problem   that is an emergency. Do not wait to see if the symptoms will go away. Get medical help right away. Call your local emergency services (911 in the [place] Do not drive yourself to the hospital. Summary  After the procedure, it is common to have bruising and tenderness at the site.  Follow instructions from your health care provider about how to take care of your radial site wound. Check the wound every day for signs of infection.  Do not lift anything that is heavier than 10 lb (4.5 kg), or the limit that you are told, until your health care provider says  that it is safe. This information is not intended to replace advice given to you by your health care provider. Make sure you discuss any questions you have with your health care provider. Document Revised: 04/18/2017 Document Reviewed: 04/18/2017 Elsevier Patient Education  2020 [employer]vier Inc[employer]  

## 2019-08-22 NOTE — Interval H&P Note (Signed)
Cath Lab Visit (complete for each Cath Lab visit)  Clinical Evaluation Leading to the Procedure:   ACS: No.  Non-ACS:    Anginal Classification: CCS III  Anti-ischemic medical therapy: Minimal Therapy (1 class of medications)  Non-Invasive Test Results: No non-invasive testing performed  Prior CABG: No previous CABG      History and Physical Interval Note:  08/22/2019 10:53 AM  Daniel Buck  has presented today for surgery, with the diagnosis of chest pain.  The various methods of treatment have been discussed with the patient and family. After consideration of risks, benefits and other options for treatment, the patient has consented to  Procedure(s): LEFT HEART CATH AND CORONARY ANGIOGRAPHY (N/A) as a surgical intervention.  The patient's history has been reviewed, patient examined, no change in status, stable for surgery.  I have reviewed the patient's chart and labs.  Questions were answered to the patient's satisfaction.     Belva Crome III

## 2019-08-26 ENCOUNTER — Other Ambulatory Visit: Payer: Self-pay

## 2019-08-26 ENCOUNTER — Encounter: Payer: 59 | Admitting: Cardiothoracic Surgery

## 2019-08-26 NOTE — Progress Notes (Signed)
This encounter was created in error - please disregard.

## 2019-08-27 ENCOUNTER — Other Ambulatory Visit: Payer: Self-pay | Admitting: *Deleted

## 2019-08-27 ENCOUNTER — Institutional Professional Consult (permissible substitution): Payer: 59 | Admitting: Surgery

## 2019-08-27 ENCOUNTER — Encounter: Payer: Self-pay | Admitting: *Deleted

## 2019-08-27 VITALS — BP 137/90 | HR 85 | Temp 98.4°F | Resp 20 | Ht 71.0 in | Wt 205.0 lb

## 2019-08-27 DIAGNOSIS — I2510 Atherosclerotic heart disease of native coronary artery without angina pectoris: Secondary | ICD-10-CM | POA: Diagnosis not present

## 2019-08-27 NOTE — Progress Notes (Signed)
Cardiothoracic Surgery Admission History and Physical   PCP is [name], MD Referring Provider is Belva Crome, MD  Chief Complaint  Patient presents with  . Coronary Artery Disease    Surgical eval, Cardiac Cath 08/22/2019, ECHO 08/29/2017    HPI:  The patient is a 54 year old gentleman with a history of premature coronary artery disease status post remote inferior MI in 1996 treated with a bare-metal stent to the RCA.  He subsequently had in-stent restenosis that was treated with atherectomy in 2002.  He had a nuclear stress test in 2012 that was low risk.  He underwent preoperative cardiac clearance for an inguinal hernia repair in February 2021 and subsequently had that repaired without difficulty.  A few weeks ago he was on a hunting trip for [place] in the [place] of [place] at 9000 [place] elevation and noted frequent palpitations and shortness of breath with minimal exertion.  He returned home and continued to have shortness of breath with exertion.  He has had some pain in his left upper arm.  He was moving furniture recently and had some shortness of breath and diaphoresis and also had some throat pain which was similar to his pre-PCI symptoms.  He says in retrospect he has been fatigued over the past year.  He was evaluated by cardiology and underwent catheterization on 08/22/2019.  This showed an eccentric ostial 40 to 50% left main stenosis followed by 50 to 70% distal left main stenosis.  There was 65% mid LAD stenosis within the region with heavy calcification.  The first diagonal branch had an 80% ostial stenosis.  The left circumflex had 70% stenosis between the first and second marginal branches.  The right coronary artery was a large vessel with diffuse in-stent restenosis up to 90% with competitive flow in the PDA from collateralization from the left coronary system.  Left ventricular ejection fraction was 50 to 60% with an LVEDP of 17 mmHg.  The patient is here today with  his wife.  [place] live [place] farm in [place].  He works as a Forensic scientist.  Past Medical History:  Diagnosis Date  . CAD S/P percutaneous coronary angioplasty 1996, 2002   a. s/p Inf MI (age 28) >>PCI to RCA with tandem BMS (PS 1535); b. LHC 06/2000: pRCA 30-40%, prox/mid stents with 75% ISR, 30-40% at crux in RCA >> PCI: Cutting Balloon atherectomy for ISR; c. 2012 MV: inf ischemia, no scar.  . Carpal tunnel syndrome on left 12/2011  . Chronic nasal congestion   . Depression 01/19/2012  . GERD (gastroesophageal reflux disease)   . Gout   . History of Doppler ultrasound    a. Carotid US 3/11: no ICA stenosis   . History of echocardiogram    a. Echo 6/14: mod LVH, EF 50-55%, inf-lat HK, Gr 1 DD, mild LAE  . Hyperlipidemia LDL goal < 70   . Hypertension    under control; has been on med. since 1996  . Myocardial infarct, old 1996   PCI  . Ulcerative colitis [facility])     Past Surgical History:  Procedure Laterality Date  . CARPAL TUNNEL RELEASE  01/26/2012   Procedure: CARPAL TUNNEL RELEASE;  Surgeon: Cammie Sickle., MD;  Location: [facility];  Service: Orthopedics;  Laterality: Left;  . CORONARY ANGIOPLASTY  07/11/2000   Cutting Balloon PTCA for RCA ISR  . CORONARY STENT PLACEMENT  1996   [id number] BMS x 2 in RCA  . CYSTECTOMY  as a child   from neck  . LEFT HEART CATH AND CORONARY ANGIOGRAPHY N/A 08/22/2019   Procedure: LEFT HEART CATH AND CORONARY ANGIOGRAPHY;  Surgeon: Belva Crome, MD;  Location: [facility];  Service: Cardiovascular;  Laterality: N/A;  . NM MYOVIEW LTD  December 2012   Inferior infarct but no ischemia  . TONSILLECTOMY  as a child  . TRANSTHORACIC ECHOCARDIOGRAM  08/2017   Normal echo-normal LV size and function.  EF 55-60%.  No R WMA.  No ASD/PFO.  No valve lesions.    Family History  Problem Relation Age of Onset  . AAA (abdominal aortic aneurysm) Father        Died from ruptured AAA despite surgery  . CAD Father 1    . Atrial fibrillation Father   . Aortic stenosis Mother        Status post aVR    Social History Social History   Tobacco Use  . Smoking status: Former Smoker    Quit date: 03/27/1992    Years since quitting: 27.4  . Smokeless tobacco: Current User    Types: Snuff, Chew  Substance Use Topics  . Alcohol use: No  . Drug use: No    Current Outpatient Medications  Medication Sig Dispense Refill  . acetaminophen (TYLENOL) 325 MG tablet Take 650 mg by mouth every 6 (six) hours as needed for moderate pain.    . Cholecalciferol (VITAMIN D) 125 MCG (5000 UT) CAPS Take 5,000 Units by mouth daily.    . clopidogrel (PLAVIX) 75 MG tablet TAKE 1 TABLET BY MOUTH DAILY. (Patient taking differently: Take 75 mg by mouth daily. ) 90 tablet 2  . esomeprazole (NEXIUM) 40 MG capsule Take 40 mg by mouth daily.     . fluticasone (FLONASE) 50 MCG/ACT nasal spray Place 1 spray into both nostrils daily.    . isosorbide mononitrate (IMDUR) 30 MG 24 hr tablet Take 1 tablet (30 mg total) by mouth daily. 90 tablet 1  . LIALDA 1.2 g EC tablet Take 1.2 g by mouth QID.     [name] nitroGLYCERIN (NITROSTAT) 0.4 MG SL tablet Place 1 tablet (0.4 mg total) under the tongue every 5 (five) minutes as needed for chest pain. 25 tablet 4  . rosuvastatin (CRESTOR) 40 MG tablet TAKE 1 TABLET (40 MG TOTAL) BY MOUTH DAILY. 90 tablet 1  . Vilazodone HCl (VIIBRYD) 40 MG TABS Take 40 mg by mouth daily.     No current facility-administered medications for this visit.    No Known Allergies  Review of Systems  Constitutional: Positive for activity change, diaphoresis and fatigue.  HENT: Negative.   Eyes: Negative.   Respiratory: Positive for chest tightness and shortness of breath.   Cardiovascular: Positive for chest pain and palpitations. Negative for leg swelling.  Gastrointestinal:       Difficulty swallowing  Endocrine: Negative.   Genitourinary: Positive for frequency.  Musculoskeletal: Negative.   Skin: Negative.    Neurological:       Memory problems  Hematological: Bruises/bleeds easily.  Psychiatric/Behavioral: The patient is nervous/anxious.        Depression    BP 137/90 (BP Location: Left Arm, Patient Position: Sitting, Cuff Size: Large)   Pulse 85   Temp 98.4 F (36.9 C) (Temporal)   Resp 20   Ht 5' 11"  (1.803 m)   Wt 205 lb (93 kg)   SpO2 97% Comment: RA  BMI 28.59 kg/m  Physical Exam Constitutional:  Appearance: He is normal weight.  HENT:     Head: Normocephalic and atraumatic.  Eyes:     Extraocular Movements: Extraocular movements intact.     Conjunctiva/sclera: Conjunctivae normal.     Pupils: Pupils are equal, round, and reactive to light.  Neck:     Vascular: No carotid bruit.  Cardiovascular:     Rate and Rhythm: Normal rate and regular rhythm.     Pulses: Normal pulses.     Heart sounds: No murmur.  Pulmonary:     Effort: Pulmonary effort is normal.     Breath sounds: Normal breath sounds.  Abdominal:     General: Abdomen is flat.     Palpations: Abdomen is soft.  Musculoskeletal:        General: No swelling.     Cervical back: Neck supple.  Skin:    General: Skin is warm and dry.  Neurological:     General: No focal deficit present.     Mental Status: He is alert and oriented to person, place, and time.  Psychiatric:        Mood and Affect: Mood normal.        Behavior: Behavior normal.        Thought Content: Thought content normal.        Judgment: Judgment normal.      Diagnostic Tests:  Physicians Panel Physicians Referring Physician Case Authorizing Physician  Belva Crome, MD (Primary)       Procedures LEFT HEART CATH AND CORONARY ANGIOGRAPHY     Conclusion Eccentric ostial 40 to 50% left main followed by a 55 to 70% distal left main depending upon review.  65% mid LAD within the region of heavy calcification, first diagonal contains ostial proximal 80% stenosis.  Circumflex contains 70% stenosis between OM1 and OM 2.  RCA is  large and contains diffuse in-stent restenosis within the bare-metal stent placed in 1996 and retreated and 2002. Stenosis is up to 90%. The PDA has competitive flow due to collateralization from the left coronary artery.  LV function is overall normal with EF 50 to 60%. LVEDP 17 mmHg. RECOMMENDATIONS:  I have recommended surgical consultation for coronary bypass grafting. We will try to have this arranged to be done within the next 5 to 7 days.  On Plavix and will need to have this discontinued at least 5 days prior to surgery. I am resuming aspirin therapy today.     Recommendations Antiplatelet/Anticoag Recommend Aspirin 41m daily for moderate CAD.     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 Intravenous sedation with Versed and fentanyl was administered. 1% Xylocaine was infiltrated to achieve local analgesia. Using real-time vascular ultrasound, a double wall stick with an angiocath was utilized to obtain intra-arterial access. A VUS image was saved for the record.The modified Seldinger technique was used to place a 24F " Slender" sheath in the right radial artery. Weight based heparin was administered. Coronary angiography was done using 5 F catheters. Right coronary angiography was performed with a JR4. Left ventricular hemodymic recordings and angiography was done using the JR 4 catheter and hand injection. Left coronary angiography was performed with a JL 3.5 cm.  Hemostasis  was achieved using a pneumatic band.  During this procedure the patient is administered a total of Versed 2 mg and Fentanyl of 25 mcg to achieve and maintain moderate conscious sedation. The patient's heart  rate, blood pressure, and oxygen saturation are monitored continuously during the procedure. The period of conscious sedation is 25 minutes, of which I was present face-to-face 100% of this time. Estimated blood loss <50 mL.   During this procedure medications were administered to achieve and maintain moderate conscious sedation while the patient's heart rate, blood pressure, and oxygen saturation were continuously monitored and I was present face-to-face 100% of this time.     Medications (Filter: Administrations occurring from 08/22/19 1036 to 08/22/19 1140)  Continuous medications are totaled by the amount administered until 08/22/19 1140.  Heparin (Porcine) in NaCl 1000-0.9 UT/500ML-% SOLN (mL) Total volume: 500 mL  Date/Time   Rate/Dose/Volume Action  08/22/19 1042  500 mL Given  Heparin (Porcine) in NaCl 1000-0.9 UT/500ML-% SOLN (mL) Total volume: 500 mL  Date/Time   Rate/Dose/Volume Action  08/22/19 1042  500 mL Given  midazolam (VERSED) injection (mg) Total dose: 2 mg  Date/Time   Rate/Dose/Volume Action  08/22/19 1100  1 mg Given  1104  1 mg Given  fentaNYL (SUBLIMAZE) injection (mcg) Total dose: 25 mcg  Date/Time   Rate/Dose/Volume Action  08/22/19 1100  25 mcg Given  lidocaine (PF) (XYLOCAINE) 1 % injection (mL) Total volume: 2 mL  Date/Time   Rate/Dose/Volume Action  08/22/19 1102  2 mL Given  Radial Cocktail/Verapamil only (mL) Total volume: 10 mL  Date/Time   Rate/Dose/Volume Action  08/22/19 1103  10 mL Given  heparin sodium (porcine) injection (Units) Total dose: 4,500 Units  Date/Time   Rate/Dose/Volume Action  08/22/19 1108  4,500 Units Given  iohexol (OMNIPAQUE) 350 MG/ML injection (mL) Total volume: 70 mL  Date/Time   Rate/Dose/Volume Action  08/22/19 1134  70 mL Given     Sedation Time Sedation Time Physician-1: 25 minutes 23 seconds        Contrast Medication Name Total Dose  iohexol (OMNIPAQUE) 350 MG/ML injection 70 mL       Radiation/Fluoro Fluoro time: 7.1 (min)  DAP: 28366 (mGycm2)  Cumulative Air Kerma: 280 (mGy)        Coronary Findings Diagnostic Dominance: Right  Left Main  Ost LM lesion 50% stenosed  Ost LM lesion is 50% stenosed.  Mid LM to Dist LM lesion 65% stenosed  Mid LM to Dist LM lesion is 65% stenosed.   Left Anterior Descending  Mid LAD lesion 65% stenosed  Mid LAD lesion is 65% stenosed.   First Diagonal Branch  1st Diag lesion 85% stenosed  1st Diag lesion is 85% stenosed.   Left Circumflex  Prox Cx to Mid Cx lesion 70% stenosed  Prox Cx to Mid Cx lesion is 70% stenosed.   First Obtuse Marginal Branch  Vessel is small in size.   Right Coronary Artery  Ost RCA to Prox RCA lesion 85% stenosed  Ost RCA to Prox RCA lesion is 85% stenosed.  Prox RCA lesion 85% stenosed  Prox RCA lesion is 85% stenosed. The lesion was previously treated.  Mid RCA lesion 90% stenosed  Mid RCA lesion is 90% stenosed.   Right Posterior Descending Artery  Collaterals  RPDA filled by collaterals from Dist LAD.    Intervention No interventions have been documented.  Wall Motion Resting               All segments of the heart are normal.               Left Heart Left Ventricle The left ventricular size is normal. The left ventricular systolic function is normal. LV end diastolic pressure is normal. The left ventricular ejection fraction is 55-65% by visual estimate.     Coronary Diagrams Diagnostic Dominance: Right  &&&&&  Intervention      Implants  No implant documentation for this case.      Syngo Images Link to Procedure Log  Show images for CARDIAC CATHETERIZATION Procedure Log     Images on Long Term Storage   Show images for Icenhour, Daden C         Hemo Data   Most Recent Value  AO Systolic Pressure 060 mmHg  AO Diastolic Pressure 73 mmHg  AO Mean 94 mmHg  LV Systolic Pressure 156 mmHg  LV Diastolic Pressure 3 mmHg  LV  EDP 17 mmHg  AOp Systolic Pressure 153 mmHg  AOp Diastolic Pressure 77 mmHg  AOp Mean Pressure 794 mmHg  LVp Systolic Pressure 327 mmHg  LVp Diastolic Pressure 6 mmHg  LVp EDP Pressure 17 mmHg    Impression:  This 54 year old gentleman has left main and severe three-vessel coronary disease with progressive anginal symptoms.  I agree that coronary bypass graft surgery is the best treatment to resolve his symptoms and prevent myocardial infarction. I discussed the operative procedure with the patient and his wife including alternatives, benefits and risks; including but not limited to bleeding, blood transfusion, infection, stroke, myocardial infarction, graft failure, heart block requiring a permanent pacemaker, organ dysfunction, and death.  Prestyn C Netz understands and agrees to proceed.    Plan:  He will be scheduled for coronary bypass graft surgery on Thursday, 09/04/2019.  He will discontinue Plavix as of today and will start taking aspirin 81 mg daily.   I spent 60 minutes performing this consultation and > 50% of this time was spent face to face counseling and coordinating the care of this patient's left main and multivessel coronary artery disease.   Gaye Pollack, MD [facility] [phone]

## 2019-08-28 ENCOUNTER — Encounter: Payer: 59 | Admitting: Cardiothoracic Surgery

## 2019-08-29 ENCOUNTER — Ambulatory Visit: Payer: 59 | Admitting: Medical

## 2019-09-01 NOTE — Progress Notes (Addendum)
[place], [place] - [facility]. [address] [place] [address] Phone: [phone] Fax: [place], [place] - [place] [address] Phone: [phone] Fax: [phone]      Your procedure is scheduled on Thursday 09/04/2019.  Report to [facility] Main Entrance "A" at 05:30 A.M., and check in at the Admitting office.  Call this number if you have problems, questions, or concerns: [phone]      Remember:  Do not eat or drink after midnight the night before your surgery     Take these medicines the morning of surgery with A SIP OF WATER: Esomeprazole (Nexium) Fluticasone (Flonase) Isosorbide Mononitrate (Imdur) Rosuvastatin (Crestor) Vilazodone HCl (VIIBRYD)  If needed: Acetaminophen (Tylenol); Nitroglycerin (Nitrostat)  As of today, STOP taking any LIALDA and any other NSAIDs -  Aleve, Naproxen, Ibuprofen, Motrin, Advil, Goody's, BC's, all herbal medications, fish oil, and all vitamins.  Per Dr. Vivi Martens instructions stop Plavix (Clopidogrel) 7 days prior to your surgery.    Follow your surgeon's instructions on when to stop Aspirin.  If no instructions were given by your surgeon then you will need to call the office to get those instructions.                        Do not wear jewelry            Do not wear lotions, powders, colognes, or deodorant.            Do not shave 48 hours prior to surgery.  Men may shave face and neck.            Do not bring valuables to the hospital.            [facility] is not responsible for any belongings or valuables.  Do NOT Smoke (Tobacco/Vapping) or drink Alcohol 24 hours prior to your procedure  If you use a CPAP at night, you may bring all equipment for your overnight stay.   Contacts, glasses, dentures or bridgework may not be worn into surgery.      For patients admitted to the  hospital, discharge time will be determined by your treatment team.   Patients discharged the day of surgery will not be allowed to drive home, and someone needs to stay with them for 24 hours.    Special instructions:   [facility]- Preparing For Surgery  Before surgery, you can play an important role. Because skin is not sterile, your skin needs to be as free of germs as possible. You can reduce the number of germs on your skin by washing with CHG (chlorahexidine gluconate) Soap before surgery.  CHG is an antiseptic cleaner which kills germs and bonds with the skin to continue killing germs even after washing.    Oral Hygiene is also important to reduce your risk of infection.  Remember - BRUSH YOUR TEETH THE MORNING OF SURGERY WITH YOUR REGULAR TOOTHPASTE  Please do not use if you have an allergy to CHG or antibacterial soaps. If your skin becomes reddened/irritated stop using the CHG.  Do not shave (including legs and underarms) for at least 48 hours prior to first CHG shower. It is OK to shave your face.  Please follow these instructions carefully.   1. Shower the NIGHT BEFORE SURGERY and the MORNING OF SURGERY with CHG Soap.   2. If  you chose to wash your hair, wash your hair first as usual with your normal shampoo.  3. After you shampoo, rinse your hair and body thoroughly to remove the shampoo.  4. Use CHG as you would any other liquid soap. You can apply CHG directly to the skin and wash gently with a scrungie or a clean washcloth.   5. Apply the CHG Soap to your body ONLY FROM THE NECK DOWN.  Do not use on open wounds or open sores. Avoid contact with your eyes, ears, mouth and genitals (private parts). Wash Face and genitals (private parts)  with your normal soap.   6. Wash thoroughly, paying special attention to the area where your surgery will be performed.  7. Thoroughly rinse your body with warm water from the neck down.  8. DO NOT shower/wash with your normal soap  after using and rinsing off the CHG Soap.  9. Pat yourself dry with a CLEAN TOWEL.  10. Wear CLEAN PAJAMAS to bed the night before surgery, wear comfortable clothes the morning of surgery  11. Place CLEAN SHEETS on your bed the night of your first shower and DO NOT SLEEP WITH PETS.   Day of Surgery:   Do not apply any deodorants/lotions.  Please wear clean clothes to the hospital/surgery center.   Remember to brush your teeth WITH YOUR REGULAR TOOTHPASTE.   Please read over the following fact sheets that you were given.

## 2019-09-02 ENCOUNTER — Encounter (HOSPITAL_COMMUNITY)
Admission: RE | Admit: 2019-09-02 | Discharge: 2019-09-02 | Disposition: A | Discharge status: Home / Self Care | Payer: 59 | Source: Ambulatory Visit | Attending: Surgery | Admitting: Surgery

## 2019-09-02 ENCOUNTER — Other Ambulatory Visit: Payer: Self-pay

## 2019-09-02 ENCOUNTER — Ambulatory Visit (HOSPITAL_BASED_OUTPATIENT_CLINIC_OR_DEPARTMENT_OTHER)
Admission: RE | Admit: 2019-09-02 | Discharge: 2019-09-02 | Disposition: A | Discharge status: Home / Self Care | Payer: 59 | Source: Ambulatory Visit | Attending: Surgery | Admitting: Surgery

## 2019-09-02 ENCOUNTER — Telehealth: Payer: Self-pay | Admitting: Cardiology

## 2019-09-02 ENCOUNTER — Other Ambulatory Visit: Payer: Self-pay | Admitting: Cardiology

## 2019-09-02 ENCOUNTER — Other Ambulatory Visit (HOSPITAL_COMMUNITY)
Admission: RE | Admit: 2019-09-02 | Discharge: 2019-09-02 | Disposition: A | Discharge status: Home / Self Care | Payer: 59 | Source: Ambulatory Visit | Attending: Surgery | Admitting: Surgery

## 2019-09-02 ENCOUNTER — Encounter (HOSPITAL_COMMUNITY): Payer: Self-pay

## 2019-09-02 DIAGNOSIS — I252 Old myocardial infarction: Secondary | ICD-10-CM | POA: Diagnosis not present

## 2019-09-02 DIAGNOSIS — I25118 Atherosclerotic heart disease of native coronary artery with other forms of angina pectoris: Secondary | ICD-10-CM | POA: Diagnosis not present

## 2019-09-02 DIAGNOSIS — I10 Essential (primary) hypertension: Secondary | ICD-10-CM | POA: Diagnosis not present

## 2019-09-02 DIAGNOSIS — J90 Pleural effusion, not elsewhere classified: Secondary | ICD-10-CM | POA: Diagnosis not present

## 2019-09-02 DIAGNOSIS — D62 Acute posthemorrhagic anemia: Secondary | ICD-10-CM | POA: Diagnosis not present

## 2019-09-02 DIAGNOSIS — I2510 Atherosclerotic heart disease of native coronary artery without angina pectoris: Secondary | ICD-10-CM

## 2019-09-02 DIAGNOSIS — R0602 Shortness of breath: Secondary | ICD-10-CM | POA: Diagnosis not present

## 2019-09-02 DIAGNOSIS — Z20822 Contact with and (suspected) exposure to covid-19: Secondary | ICD-10-CM | POA: Diagnosis not present

## 2019-09-02 DIAGNOSIS — N179 Acute kidney failure, unspecified: Secondary | ICD-10-CM | POA: Diagnosis not present

## 2019-09-02 DIAGNOSIS — J9811 Atelectasis: Secondary | ICD-10-CM | POA: Diagnosis not present

## 2019-09-02 DIAGNOSIS — T82855A Stenosis of coronary artery stent, initial encounter: Secondary | ICD-10-CM | POA: Diagnosis not present

## 2019-09-02 HISTORY — DX: Personal history of other diseases of the digestive system: Z87.19

## 2019-09-02 HISTORY — DX: Sleep apnea, unspecified: G47.30

## 2019-09-02 LAB — BLOOD GAS, ARTERIAL
Acid-base deficit: 0.3 mmol/L (ref 0.0–2.0)
Bicarbonate: 23.7 mmol/L (ref 20.0–28.0)
FIO2: 21
O2 Saturation: 98.1 %
Patient temperature: 37
pCO2 arterial: 38.3 mmHg (ref 32.0–48.0)
pH, Arterial: 7.409 (ref 7.350–7.450)
pO2, Arterial: 105 mmHg (ref 83.0–108.0)

## 2019-09-02 LAB — COMPREHENSIVE METABOLIC PANEL
ALT: 33 U/L (ref 0–44)
AST: 36 U/L (ref 15–41)
Albumin: 4.3 g/dL (ref 3.5–5.0)
Alkaline Phosphatase: 66 U/L (ref 38–126)
Anion gap: 11 (ref 5–15)
BUN: 16 mg/dL (ref 6–20)
CO2: 22 mmol/L (ref 22–32)
Calcium: 9.7 mg/dL (ref 8.9–10.3)
Chloride: 104 mmol/L (ref 98–111)
Creatinine, Ser: 1.22 mg/dL (ref 0.61–1.24)
GFR calc Af Amer: >60 mL/min (ref >60)
GFR calc non Af Amer: >60 mL/min (ref >60)
Glucose, Bld: 99 mg/dL (ref 70–99)
Potassium: 3.9 mmol/L (ref 3.5–5.1)
Sodium: 137 mmol/L (ref 135–145)
Total Bilirubin: 0.7 mg/dL (ref 0.3–1.2)
Total Protein: 7.5 g/dL (ref 6.5–8.1)

## 2019-09-02 LAB — CBC
HCT: 47.9 % (ref 39.0–52.0)
Hemoglobin: 15.9 g/dL (ref 13.0–17.0)
MCH: 28.8 pg (ref 26.0–34.0)
MCHC: 33.2 g/dL (ref 30.0–36.0)
MCV: 86.8 fL (ref 80.0–100.0)
Platelets: 234 10*3/uL (ref 150–400)
RBC: 5.52 MIL/uL (ref 4.22–5.81)
RDW: 12.5 % (ref 11.5–15.5)
WBC: 6.1 10*3/uL (ref 4.0–10.5)
nRBC: 0 % (ref 0.0–0.2)

## 2019-09-02 LAB — HEMOGLOBIN A1C
Hgb A1c MFr Bld: 6.1 % — ABNORMAL HIGH (ref 4.8–5.6)
Mean Plasma Glucose: 128.37 mg/dL

## 2019-09-02 LAB — URINALYSIS, ROUTINE W REFLEX MICROSCOPIC
Bilirubin Urine: NEGATIVE
Glucose, UA: NEGATIVE mg/dL
Hgb urine dipstick: NEGATIVE
Ketones, ur: NEGATIVE mg/dL
Leukocytes,Ua: NEGATIVE
Nitrite: NEGATIVE
Protein, ur: NEGATIVE mg/dL
Specific Gravity, Urine: 1.009 (ref 1.005–1.030)
pH: 6 (ref 5.0–8.0)

## 2019-09-02 LAB — TYPE AND SCREEN
ABO/RH(D): A POS
Antibody Screen: NEGATIVE

## 2019-09-02 LAB — SURGICAL PCR SCREEN
MRSA, PCR: NEGATIVE
Staphylococcus aureus: POSITIVE — AB

## 2019-09-02 LAB — PROTIME-INR
INR: 1 (ref 0.8–1.2)
Prothrombin Time: 12.3 seconds (ref 11.4–15.2)

## 2019-09-02 LAB — APTT: aPTT: 35 seconds (ref 24–36)

## 2019-09-02 LAB — ABO/RH: ABO/RH(D): A POS

## 2019-09-02 LAB — SARS CORONAVIRUS 2 (TAT 6-24 HRS): SARS Coronavirus 2: NEGATIVE

## 2019-09-02 MED ORDER — AMLODIPINE BESYLATE 5 MG PO TABS
5.0000 mg | ORAL_TABLET | Freq: Every day | ORAL | 3 refills | Status: DC
Start: 2019-09-02 — End: 2019-12-29

## 2019-09-02 NOTE — Telephone Encounter (Signed)
I would suggest he start Amlodipine 5 mg daily  Delane Wessinger PA-C 09/02/2019 10:46 AM

## 2019-09-02 NOTE — Telephone Encounter (Signed)
Pts wife, Lenna [place] on [place], called to report that the pt went for his preop today prior to CABG with Dr. Cyndia Bent this Thursday.. his BP was elevated 153/104, 156/100. He is asymptomatic.   She says it has been uo and down at home no readings except she says the systolic has been 533'F-179'E/ 90's.   He is only taking Imdur and not on any other BP meds.   I advised her to continue to monitor and will forward to [facility] PA they have requested his assistance since he has seen him recently .

## 2019-09-02 NOTE — Progress Notes (Signed)
Pre-CABG Dopplers completed. Refer to "CV Proc" under chart review to view preliminary results.  09/02/2019 3:38 PM Kelby Aline., MHA, RVT, RDCS, RDMS

## 2019-09-02 NOTE — Progress Notes (Addendum)
PCP - K SHAW Cardiologist -DR Wilhemina Cash     CLEARANCE DONE    -  Chest x-ray - 09/02/19 EKG - 08/20/19 Stress Test -NA  ECHO - 6/5 Cardiac Cath -5/28   Sleep Study - AWAITING TO HAVE DONE CPAP - NO  Blood Thinner Instructions:STOP PLAVIX Aspirin Instructions:PT STARTED ASA PER INSTRUCTION   COVID TEST- FOR TODAY   Anesthesia review: HEART HX, HTN     PT WILL CALL HIS CARDIOLOGIST AND REPORT BP  152/104,156/100  JAMES PA ALSO NOTIFIED  Patient denies shortness of breath, fever, cough and chest pain at PAT appointment   All instructions explained to the patient, with a verbal understanding of the material. Patient agrees to go over the instructions while at home for a better understanding. Patient also instructed to self quarantine after being tested for COVID-19. The opportunity to ask questions was provided.

## 2019-09-02 NOTE — Telephone Encounter (Signed)
Pt c/o BP issue: STAT if pt c/o blurred vision, one-sided weakness or slurred speech  1. What are your last 5 BP readings?  153/104 156/100  2. Are you having any other symptoms (ex. Dizziness, headache, blurred vision, passed out)? No  3. What is your BP issue? [name] wife states BP has been increased.

## 2019-09-02 NOTE — Telephone Encounter (Signed)
Pts wife advised and will start Amlodipine 5 mg today. Will call if they have any further problems or questions.

## 2019-09-03 ENCOUNTER — Encounter (HOSPITAL_COMMUNITY): Payer: Self-pay | Admitting: Surgery

## 2019-09-03 MED ORDER — DEXMEDETOMIDINE HCL IN NACL 400 MCG/100ML IV SOLN
0.1000 ug/kg/h | INTRAVENOUS | Status: AC
  Administered 2019-09-04: .5 ug/kg/h via INTRAVENOUS
  Filled 2019-09-03: qty 100

## 2019-09-03 MED ORDER — TRANEXAMIC ACID (OHS) PUMP PRIME SOLUTION
2.0000 mg/kg | INTRAVENOUS | Status: DC
  Filled 2019-09-03: qty 1.88

## 2019-09-03 MED ORDER — PLASMA-LYTE 148 IV SOLN
INTRAVENOUS | Status: DC
  Filled 2019-09-03: qty 2.5

## 2019-09-03 MED ORDER — MILRINONE LACTATE IN DEXTROSE 20-5 MG/100ML-% IV SOLN
0.3000 ug/kg/min | INTRAVENOUS | Status: DC
  Filled 2019-09-03: qty 100

## 2019-09-03 MED ORDER — MAGNESIUM SULFATE 50 % IJ SOLN
40.0000 meq | INTRAMUSCULAR | Status: DC
  Filled 2019-09-03: qty 9.85

## 2019-09-03 MED ORDER — SODIUM CHLORIDE 0.9 % IV SOLN
1.5000 g | INTRAVENOUS | Status: AC
  Administered 2019-09-04: 1.5 g via INTRAVENOUS
  Filled 2019-09-03: qty 1.5

## 2019-09-03 MED ORDER — NOREPINEPHRINE 4 MG/250ML-% IV SOLN
0.0000 ug/min | INTRAVENOUS | Status: DC
  Filled 2019-09-03: qty 250

## 2019-09-03 MED ORDER — SODIUM CHLORIDE 0.9 % IV SOLN
750.0000 mg | INTRAVENOUS | Status: AC
  Administered 2019-09-04: 750 mg via INTRAVENOUS
  Filled 2019-09-03: qty 750

## 2019-09-03 MED ORDER — EPINEPHRINE HCL 5 MG/250ML IV SOLN IN NS
0.0000 ug/min | INTRAVENOUS | Status: DC
  Filled 2019-09-03: qty 250

## 2019-09-03 MED ORDER — TRANEXAMIC ACID 1000 MG/10ML IV SOLN
1.5000 mg/kg/h | INTRAVENOUS | Status: AC
  Administered 2019-09-04: 1.5 mg/kg/h via INTRAVENOUS
  Filled 2019-09-03: qty 25

## 2019-09-03 MED ORDER — SODIUM CHLORIDE 0.9 % IV SOLN
INTRAVENOUS | Status: DC
  Filled 2019-09-03: qty 30

## 2019-09-03 MED ORDER — POTASSIUM CHLORIDE 2 MEQ/ML IV SOLN
80.0000 meq | INTRAVENOUS | Status: DC
  Filled 2019-09-03: qty 40

## 2019-09-03 MED ORDER — INSULIN REGULAR(HUMAN) IN NACL 100-0.9 UT/100ML-% IV SOLN
INTRAVENOUS | Status: AC
  Administered 2019-09-04: .9 [IU]/h via INTRAVENOUS
  Filled 2019-09-03: qty 100

## 2019-09-03 MED ORDER — TRANEXAMIC ACID (OHS) BOLUS VIA INFUSION
15.0000 mg/kg | INTRAVENOUS | Status: AC
  Administered 2019-09-04: 1410 mg via INTRAVENOUS
  Filled 2019-09-03: qty 1410

## 2019-09-03 MED ORDER — PHENYLEPHRINE HCL-NACL 20-0.9 MG/250ML-% IV SOLN
30.0000 ug/min | INTRAVENOUS | Status: AC
  Administered 2019-09-04: 30 ug/min via INTRAVENOUS
  Administered 2019-09-04: 10 ug/min via INTRAVENOUS
  Filled 2019-09-03: qty 250

## 2019-09-03 MED ORDER — VANCOMYCIN HCL 1500 MG/300ML IV SOLN
1500.0000 mg | INTRAVENOUS | Status: AC
  Administered 2019-09-04: 1500 mg via INTRAVENOUS
  Filled 2019-09-03: qty 300

## 2019-09-03 MED ORDER — NITROGLYCERIN IN D5W 200-5 MCG/ML-% IV SOLN
2.0000 ug/min | INTRAVENOUS | Status: AC
  Administered 2019-09-04: 33.3 ug/min via INTRAVENOUS
  Filled 2019-09-03: qty 250

## 2019-09-03 NOTE — Anesthesia Preprocedure Evaluation (Addendum)
Anesthesia Evaluation  Patient identified by MRN, date of birth, ID band Patient awake    Reviewed: Allergy & Precautions, NPO status , [name] Chart, lab work & pertinent test results, reviewed documented beta blocker date and time   Airway Mallampati: II  TM Distance: >3 FB Neck ROM: Full    Dental no notable dental hx. (+) Teeth Intact, Dental Advisory Given   Pulmonary former smoker,    Pulmonary exam normal breath sounds clear to auscultation       Cardiovascular hypertension, Pt. on medications + CAD, + Past MI and + Cardiac Stents (x 2)  Normal cardiovascular exam Rhythm:Regular Rate:Normal     Neuro/Psych PSYCHIATRIC DISORDERS Depression  Neuromuscular disease    GI/Hepatic Neg liver ROS, hiatal hernia, PUD, GERD  Medicated and Controlled,Ulcerative colitis   Endo/Other  negative endocrine ROS  Renal/GU negative Renal ROS     Musculoskeletal Gout   Abdominal   Peds  Hematology HLD   Anesthesia Other Findings   Reproductive/Obstetrics                          Anesthesia Physical Anesthesia Plan  ASA: IV  Anesthesia Plan: General   Post-op Pain Management:    Induction: Intravenous  PONV Risk Score and Plan: 2 and Ondansetron, Dexamethasone, Midazolam and Treatment may vary due to age or medical condition  Airway Management Planned: Oral ETT  Additional Equipment: Arterial line, CVP, PA Cath, TEE and Ultrasound Guidance Line Placement  Intra-op Plan:   Post-operative Plan: Post-operative intubation/ventilation  Informed Consent: I have reviewed the patients History and Physical, chart, labs and discussed the procedure including the risks, benefits and alternatives for the proposed anesthesia with the patient or authorized representative who has indicated his/her understanding and acceptance.     Dental advisory given  Plan Discussed with: CRNA and  Anesthesiologist  Anesthesia Plan Comments:        Anesthesia Quick Evaluation

## 2019-09-03 NOTE — H&P (Signed)
[address]       [place] [address]             [phone]      Cardiothoracic Surgery Admission History and Physical   PCP is [name], MD  Referring Provider is Belva Crome, MD      Chief Complaint  Patient presents with  . Coronary Artery Disease       HPI:  The patient is a 54 year old gentleman with a history of premature coronary artery disease status post remote inferior MI in 1996 treated with a bare-metal stent to the RCA. He subsequently had in-stent restenosis that was treated with atherectomy in 2002. He had a nuclear stress test in 2012 that was low risk. He underwent preoperative cardiac clearance for an inguinal hernia repair in February 2021 and subsequently had that repaired without difficulty. A few weeks ago he was on a hunting trip for [place] in the [place] 9000 [place] elevation and noted frequent palpitations and shortness of breath with minimal exertion. He returned home and continued to have shortness of breath with exertion. He has had some pain in his left upper arm. He was moving furniture recently and had some shortness of breath and diaphoresis and also had some throat pain which was similar to his pre-PCI symptoms. He says in retrospect he has been fatigued over the past year. He was evaluated by cardiology and underwent catheterization on 08/22/2019. This showed an eccentric ostial 40 to 50% left main stenosis followed by 50 to 70% distal left main stenosis. There was 65% mid LAD stenosis within the region with heavy calcification. The first diagonal branch had an 80% ostial stenosis. The left circumflex had 70% stenosis between the first and second marginal branches. The right coronary artery was a large vessel with diffuse in-stent restenosis up to 90% with competitive flow in the PDA from collateralization from the left coronary system. Left ventricular ejection fraction was 50 to 60% with an LVEDP of 17 mmHg.  The  patient is married and lives with his wife. [place] live [place] in [place]. He works as a Forensic scientist.      Past Medical History:  Diagnosis Date  . CAD S/P percutaneous coronary angioplasty 1996, 2002   a. s/p Inf MI (age 33) >>PCI to RCA with tandem BMS (PS 1535); b. LHC 06/2000: pRCA 30-40%, prox/mid stents with 75% ISR, 30-40% at crux in RCA >> PCI: Cutting Balloon atherectomy for ISR; c. 2012 MV: inf ischemia, no scar.  . Carpal tunnel syndrome on left 12/2011  . Chronic nasal congestion   . Depression 01/19/2012  . GERD (gastroesophageal reflux disease)   . Gout   . History of Doppler ultrasound    a. Carotid US 3/11: no ICA stenosis   . History of echocardiogram    a. Echo 6/14: mod LVH, EF 50-55%, inf-lat HK, Gr 1 DD, mild LAE  . Hyperlipidemia LDL goal < 70   . Hypertension    under control; has been on med. since 1996  . Myocardial infarct, old 1996   PCI  . Ulcerative colitis [facility])         Past Surgical History:  Procedure Laterality Date  . CARPAL TUNNEL RELEASE  01/26/2012   Procedure: CARPAL TUNNEL RELEASE; Surgeon: Cammie Sickle., MD; Location: [facility]; Service: Orthopedics; Laterality: Left;  . CORONARY ANGIOPLASTY  07/11/2000   Cutting Balloon PTCA for  RCA ISR  . CORONARY STENT PLACEMENT  1996   [id number] BMS x 2 in RCA  . CYSTECTOMY  as a child   from neck  . LEFT HEART CATH AND CORONARY ANGIOGRAPHY N/A 08/22/2019   Procedure: LEFT HEART CATH AND CORONARY ANGIOGRAPHY; Surgeon: Belva Crome, MD; Location: [facility]; Service: Cardiovascular; Laterality: N/A;  . NM MYOVIEW LTD  December 2012   Inferior infarct but no ischemia  . TONSILLECTOMY  as a child  . TRANSTHORACIC ECHOCARDIOGRAM  08/2017   Normal echo-normal LV size and function. EF 55-60%. No R WMA. No ASD/PFO. No valve lesions.        Family History  Problem Relation Age of Onset  . AAA (abdominal aortic aneurysm) Father    Died from ruptured AAA despite  surgery  . CAD Father 72  . Atrial fibrillation Father   . Aortic stenosis Mother    Status post aVR   Social History  Social History        Tobacco Use  . Smoking status: Former Smoker    Quit date: 03/27/1992    Years since quitting: 27.4  . Smokeless tobacco: Current User    Types: Snuff, Chew  Substance Use Topics  . Alcohol use: No  . Drug use: No         Current Outpatient Medications  Medication Sig Dispense Refill  . acetaminophen (TYLENOL) 325 MG tablet Take 650 mg by mouth every 6 (six) hours as needed for moderate pain.    . Cholecalciferol (VITAMIN D) 125 MCG (5000 UT) CAPS Take 5,000 Units by mouth daily.    . clopidogrel (PLAVIX) 75 MG tablet TAKE 1 TABLET BY MOUTH DAILY. (Patient taking differently: Take 75 mg by mouth daily. ) 90 tablet 2  . esomeprazole (NEXIUM) 40 MG capsule Take 40 mg by mouth daily.     . fluticasone (FLONASE) 50 MCG/ACT nasal spray Place 1 spray into both nostrils daily.    . isosorbide mononitrate (IMDUR) 30 MG 24 hr tablet Take 1 tablet (30 mg total) by mouth daily. 90 tablet 1  . LIALDA 1.2 g EC tablet Take 1.2 g by mouth QID.     [name] nitroGLYCERIN (NITROSTAT) 0.4 MG SL tablet Place 1 tablet (0.4 mg total) under the tongue every 5 (five) minutes as needed for chest pain. 25 tablet 4  . rosuvastatin (CRESTOR) 40 MG tablet TAKE 1 TABLET (40 MG TOTAL) BY MOUTH DAILY. 90 tablet 1  . Vilazodone HCl (VIIBRYD) 40 MG TABS Take 40 mg by mouth daily.     No current facility-administered medications for this visit.   No Known Allergies  Review of Systems  Constitutional: Positive for activity change, diaphoresis and fatigue.  HENT: Negative.  Eyes: Negative.  Respiratory: Positive for chest tightness and shortness of breath.  Cardiovascular: Positive for chest pain and palpitations. Negative for leg swelling.  Gastrointestinal:  Difficulty swallowing  Endocrine: Negative.  Genitourinary: Positive for frequency.  Musculoskeletal: Negative.   Skin: Negative.  Neurological:  Memory problems  Hematological: Bruises/bleeds easily.  Psychiatric/Behavioral: The patient is nervous/anxious.  Depression   BP 137/90 (BP Location: Left Arm, Patient Position: Sitting, Cuff Size: Large)  Pulse 85  Temp 98.4 F (36.9 C) (Temporal)  Resp 20  Ht 5' 11"  (1.803 m)  Wt 205 lb (93 kg)  SpO2 97% Comment: RA  BMI 28.59 kg/m  Physical Exam  Constitutional:  Appearance: He is normal weight.  HENT:  Head: Normocephalic and atraumatic.  Eyes:  Extraocular Movements: Extraocular movements intact.  Conjunctiva/sclera: Conjunctivae normal.  Pupils: Pupils are equal, round, and reactive to light.  Neck:  Vascular: No carotid bruit.  Cardiovascular:  Rate and Rhythm: Normal rate and regular rhythm.  Pulses: Normal pulses.  Heart sounds: No murmur.  Pulmonary:  Effort: Pulmonary effort is normal.  Breath sounds: Normal breath sounds.  Abdominal:  General: Abdomen is flat.  Palpations: Abdomen is soft.  Musculoskeletal:  General: No swelling.  Cervical back: Neck supple.  Skin:  General: Skin is warm and dry.  Neurological:  General: No focal deficit present.  Mental Status: He is alert and oriented to person, place, and time.  Psychiatric:  Mood and Affect: Mood normal.  Behavior: Behavior normal.  Thought Content: Thought content normal.  Judgment: Judgment normal.   Diagnostic Tests:  Physicians  Panel Physicians Referring Physician Case Authorizing Physician  Belva Crome, MD (Primary)       Procedures  LEFT HEART CATH AND CORONARY ANGIOGRAPHY     Conclusion  Eccentric ostial 40 to 50% left main followed by a 55 to 70% distal left main depending upon review.  65% mid LAD within the region of heavy calcification, first diagonal contains ostial proximal 80% stenosis.  Circumflex contains 70% stenosis between OM1 and OM 2.  RCA is large and contains diffuse in-stent restenosis within the bare-metal stent placed in  1996 and retreated and 2002. Stenosis is up to 90%. The PDA has competitive flow due to collateralization from the left coronary artery.  LV function is overall normal with EF 50 to 60%. LVEDP 17 mmHg. RECOMMENDATIONS:  I have recommended surgical consultation for coronary bypass grafting. We will try to have this arranged to be done within the next 5 to 7 days.  On Plavix and will need to have this discontinued at least 5 days prior to surgery. I am resuming aspirin therapy today.     Recommendations  Antiplatelet/Anticoag Recommend Aspirin 69m daily for moderate CAD.     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 Intravenous sedation with Versed and fentanyl was administered. 1% Xylocaine was infiltrated to achieve local analgesia. Using real-time vascular ultrasound, a double wall stick with an angiocath was utilized to obtain intra-arterial access. A VUS image was saved for the record.The modified Seldinger technique was used to place a 9F " Slender" sheath in the right radial artery. Weight based heparin was administered. Coronary angiography was done using 5 F catheters. Right coronary angiography was performed with a JR4. Left ventricular hemodymic recordings and angiography was done using the JR 4 catheter and hand injection. Left coronary angiography was performed with a JL 3.5 cm.  Hemostasis was achieved using a pneumatic band.  During this procedure the patient is administered a total of Versed 2 mg and Fentanyl of 25 mcg to achieve and maintain moderate conscious sedation. The patient's heart rate, blood pressure, and oxygen saturation are monitored continuously during  the procedure. The period of conscious sedation is 25 minutes, of which I was present face-to-face 100% of this time. Estimated blood loss <50 mL.   During this procedure medications were administered to achieve and maintain moderate conscious sedation while the  patient's heart rate, blood pressure, and oxygen saturation were continuously monitored and I was present face-to-face 100% of this time.     Medications  (Filter: Administrations occurring from 08/22/19 1036 to 08/22/19 1140)  Continuous medications are totaled by the amount administered until 08/22/19 1140.  Heparin (Porcine) in NaCl 1000-0.9 UT/500ML-% SOLN (mL)  Total volume: 500 mL  Date/Time   Rate/Dose/Volume Action  08/22/19 1042  500 mL Given  Heparin (Porcine) in NaCl 1000-0.9 UT/500ML-% SOLN (mL)  Total volume: 500 mL  Date/Time   Rate/Dose/Volume Action  08/22/19 1042  500 mL Given  midazolam (VERSED) injection (mg)  Total dose: 2 mg  Date/Time   Rate/Dose/Volume Action  08/22/19 1100  1 mg Given  1104  1 mg Given  fentaNYL (SUBLIMAZE) injection (mcg)  Total dose: 25 mcg  Date/Time   Rate/Dose/Volume Action  08/22/19 1100  25 mcg Given  lidocaine (PF) (XYLOCAINE) 1 % injection (mL)  Total volume: 2 mL  Date/Time   Rate/Dose/Volume Action  08/22/19 1102  2 mL Given  Radial Cocktail/Verapamil only (mL)  Total volume: 10 mL  Date/Time   Rate/Dose/Volume Action  08/22/19 1103  10 mL Given  heparin sodium (porcine) injection (Units)  Total dose: 4,500 Units  Date/Time   Rate/Dose/Volume Action  08/22/19 1108  4,500 Units Given  iohexol (OMNIPAQUE) 350 MG/ML injection (mL)  Total volume: 70 mL  Date/Time   Rate/Dose/Volume Action  08/22/19 1134  70 mL Given     Sedation Time  Sedation Time Physician-1: 25 minutes 23 seconds        Contrast  Medication Name Total Dose  iohexol (OMNIPAQUE) 350 MG/ML injection 70 mL     Radiation/Fluoro  Fluoro time: 7.1 (min)  DAP: 61607 (mGycm2)   Cumulative Air Kerma: 280 (mGy)        Coronary Findings  Diagnostic  Dominance: Right  Left Main  Ost LM lesion 50% stenosed  Ost LM lesion is 50% stenosed.  Mid LM to Dist LM lesion 65% stenosed  Mid LM to Dist LM lesion is 65% stenosed.   Left Anterior Descending  Mid LAD lesion 65% stenosed  Mid LAD lesion is 65% stenosed.   First Diagonal Branch  1st Diag lesion 85% stenosed  1st Diag lesion is 85% stenosed.   Left Circumflex  Prox Cx to Mid Cx lesion 70% stenosed  Prox Cx to Mid Cx lesion is 70% stenosed.   First Obtuse Marginal Branch  Vessel is small in size.   Right Coronary Artery  Ost RCA to Prox RCA lesion 85% stenosed  Ost RCA to Prox RCA lesion is 85% stenosed.  Prox RCA lesion 85% stenosed  Prox RCA lesion is 85% stenosed. The lesion was previously treated.  Mid RCA lesion 90% stenosed  Mid RCA lesion is 90% stenosed.   Right Posterior Descending Artery  Collaterals  RPDA filled by collaterals from Dist LAD.    Intervention  No interventions have been documented.                      Wall Motion         Resting               All segments of the heart are normal.                      Left Heart  Left Ventricle The left ventricular size is normal. The left ventricular systolic function is normal.  LV end diastolic pressure is normal. The left ventricular ejection fraction is 55-65% by visual estimate.     Coronary Diagrams  Diagnostic  Dominance: Right  &&&&&  Intervention       Implants  No implant documentation for this case.      Syngo Images Link to Procedure Log  Show images for CARDIAC CATHETERIZATION Procedure Log     Images on Long Term Storage   Show images for Louque, Raine C            Hemo Data    Most Recent Value  AO Systolic Pressure 136 mmHg  AO Diastolic Pressure 73 mmHg  AO Mean 94 mmHg  LV Systolic Pressure 438 mmHg  LV Diastolic Pressure 3 mmHg  LV EDP 17 mmHg  AOp Systolic Pressure  377 mmHg  AOp Diastolic Pressure 77 mmHg  AOp Mean Pressure 939 mmHg  LVp Systolic Pressure 688 mmHg  LVp Diastolic Pressure 6 mmHg  LVp EDP Pressure 17 mmHg    Impression:   This 54 year old gentleman has left main and severe three-vessel coronary disease with progressive anginal symptoms. I agree that coronary bypass graft surgery is the best treatment to resolve his symptoms and prevent myocardial infarction. I discussed the operative procedure with the patient and his wife including alternatives, benefits and risks; including but not limited to bleeding, blood transfusion, infection, stroke, myocardial infarction, graft failure, heart block requiring a permanent pacemaker, organ dysfunction, and death. Peggy C Dacus understands and agrees to proceed.    Plan:   Coronary bypass graft surgery.   Gaye Pollack, MD  [facility]  [phone]

## 2019-09-04 ENCOUNTER — Other Ambulatory Visit: Payer: Self-pay

## 2019-09-04 ENCOUNTER — Inpatient Hospital Stay (HOSPITAL_COMMUNITY): Payer: 59 | Admitting: Physician Assistant

## 2019-09-04 ENCOUNTER — Inpatient Hospital Stay (HOSPITAL_COMMUNITY): Payer: 59

## 2019-09-04 ENCOUNTER — Encounter (HOSPITAL_COMMUNITY): Payer: Self-pay | Admitting: Surgery

## 2019-09-04 ENCOUNTER — Inpatient Hospital Stay (HOSPITAL_COMMUNITY)
Admission: RE | Disposition: A | Discharge status: Home / Self Care | Payer: Self-pay | Source: Home / Self Care | Attending: Surgery

## 2019-09-04 ENCOUNTER — Inpatient Hospital Stay (HOSPITAL_COMMUNITY)
Admission: RE | Admit: 2019-09-04 | Discharge: 2019-09-09 | DRG: 236 | Disposition: A | Discharge status: Home / Self Care | Payer: 59 | Attending: Surgery | Admitting: Surgery

## 2019-09-04 ENCOUNTER — Inpatient Hospital Stay (HOSPITAL_COMMUNITY): Payer: 59 | Admitting: Certified Registered Nurse Anesthetist

## 2019-09-04 DIAGNOSIS — I25118 Atherosclerotic heart disease of native coronary artery with other forms of angina pectoris: Principal | ICD-10-CM | POA: Diagnosis present

## 2019-09-04 DIAGNOSIS — R110 Nausea: Secondary | ICD-10-CM | POA: Diagnosis not present

## 2019-09-04 DIAGNOSIS — F329 Major depressive disorder, single episode, unspecified: Secondary | ICD-10-CM | POA: Diagnosis present

## 2019-09-04 DIAGNOSIS — I252 Old myocardial infarction: Secondary | ICD-10-CM | POA: Diagnosis not present

## 2019-09-04 DIAGNOSIS — K219 Gastro-esophageal reflux disease without esophagitis: Secondary | ICD-10-CM | POA: Diagnosis present

## 2019-09-04 DIAGNOSIS — M109 Gout, unspecified: Secondary | ICD-10-CM | POA: Diagnosis present

## 2019-09-04 DIAGNOSIS — I10 Essential (primary) hypertension: Secondary | ICD-10-CM | POA: Diagnosis not present

## 2019-09-04 DIAGNOSIS — T82855A Stenosis of coronary artery stent, initial encounter: Secondary | ICD-10-CM | POA: Diagnosis present

## 2019-09-04 DIAGNOSIS — Z951 Presence of aortocoronary bypass graft: Secondary | ICD-10-CM

## 2019-09-04 DIAGNOSIS — D72829 Elevated white blood cell count, unspecified: Secondary | ICD-10-CM | POA: Diagnosis not present

## 2019-09-04 DIAGNOSIS — I517 Cardiomegaly: Secondary | ICD-10-CM | POA: Diagnosis not present

## 2019-09-04 DIAGNOSIS — N179 Acute kidney failure, unspecified: Secondary | ICD-10-CM | POA: Diagnosis not present

## 2019-09-04 DIAGNOSIS — J90 Pleural effusion, not elsewhere classified: Secondary | ICD-10-CM | POA: Diagnosis not present

## 2019-09-04 DIAGNOSIS — Z8249 Family history of ischemic heart disease and other diseases of the circulatory system: Secondary | ICD-10-CM | POA: Diagnosis not present

## 2019-09-04 DIAGNOSIS — F17220 Nicotine dependence, chewing tobacco, uncomplicated: Secondary | ICD-10-CM | POA: Diagnosis present

## 2019-09-04 DIAGNOSIS — F419 Anxiety disorder, unspecified: Secondary | ICD-10-CM | POA: Diagnosis present

## 2019-09-04 DIAGNOSIS — E785 Hyperlipidemia, unspecified: Secondary | ICD-10-CM | POA: Diagnosis not present

## 2019-09-04 DIAGNOSIS — Z955 Presence of coronary angioplasty implant and graft: Secondary | ICD-10-CM

## 2019-09-04 DIAGNOSIS — Z7902 Long term (current) use of antithrombotics/antiplatelets: Secondary | ICD-10-CM | POA: Diagnosis not present

## 2019-09-04 DIAGNOSIS — Z79899 Other long term (current) drug therapy: Secondary | ICD-10-CM | POA: Diagnosis not present

## 2019-09-04 DIAGNOSIS — Z20822 Contact with and (suspected) exposure to covid-19: Secondary | ICD-10-CM | POA: Diagnosis present

## 2019-09-04 DIAGNOSIS — I2510 Atherosclerotic heart disease of native coronary artery without angina pectoris: Secondary | ICD-10-CM | POA: Diagnosis not present

## 2019-09-04 DIAGNOSIS — Z4682 Encounter for fitting and adjustment of non-vascular catheter: Secondary | ICD-10-CM | POA: Diagnosis not present

## 2019-09-04 DIAGNOSIS — D62 Acute posthemorrhagic anemia: Secondary | ICD-10-CM | POA: Diagnosis not present

## 2019-09-04 DIAGNOSIS — E8770 Fluid overload, unspecified: Secondary | ICD-10-CM | POA: Diagnosis not present

## 2019-09-04 DIAGNOSIS — J9811 Atelectasis: Secondary | ICD-10-CM | POA: Diagnosis not present

## 2019-09-04 DIAGNOSIS — J939 Pneumothorax, unspecified: Secondary | ICD-10-CM | POA: Diagnosis not present

## 2019-09-04 DIAGNOSIS — E876 Hypokalemia: Secondary | ICD-10-CM | POA: Diagnosis not present

## 2019-09-04 DIAGNOSIS — J189 Pneumonia, unspecified organism: Secondary | ICD-10-CM | POA: Diagnosis not present

## 2019-09-04 DIAGNOSIS — Y831 Surgical operation with implant of artificial internal device as the cause of abnormal reaction of the patient, or of later complication, without mention of misadventure at the time of the procedure: Secondary | ICD-10-CM | POA: Diagnosis present

## 2019-09-04 HISTORY — PX: CORONARY ARTERY BYPASS GRAFT: SHX141

## 2019-09-04 HISTORY — PX: ENDOVEIN HARVEST OF GREATER SAPHENOUS VEIN: SHX5059

## 2019-09-04 HISTORY — DX: Presence of aortocoronary bypass graft: Z95.1

## 2019-09-04 HISTORY — PX: TEE WITHOUT CARDIOVERSION: SHX5443

## 2019-09-04 LAB — HEMOGLOBIN AND HEMATOCRIT, BLOOD
HCT: 35.6 % — ABNORMAL LOW (ref 39.0–52.0)
Hemoglobin: 11.9 g/dL — ABNORMAL LOW (ref 13.0–17.0)

## 2019-09-04 LAB — POCT I-STAT 7, (LYTES, BLD GAS, ICA,H+H)
Acid-Base Excess: 0 mmol/L (ref 0.0–2.0)
Acid-Base Excess: 2 mmol/L (ref 0.0–2.0)
Acid-Base Excess: 0 mmol/L (ref 0.0–2.0)
Acid-base deficit: 2 mmol/L (ref 0.0–2.0)
Acid-base deficit: 3 mmol/L — ABNORMAL HIGH (ref 0.0–2.0)
Acid-base deficit: 3 mmol/L — ABNORMAL HIGH (ref 0.0–2.0)
Bicarbonate: 27.2 mmol/L (ref 20.0–28.0)
Bicarbonate: 27.7 mmol/L (ref 20.0–28.0)
Bicarbonate: 24.2 mmol/L (ref 20.0–28.0)
Bicarbonate: 24.4 mmol/L (ref 20.0–28.0)
Bicarbonate: 23.3 mmol/L (ref 20.0–28.0)
Bicarbonate: 23.1 mmol/L (ref 20.0–28.0)
Calcium, Ion: 1.32 mmol/L (ref 1.15–1.40)
Calcium, Ion: 1.04 mmol/L — ABNORMAL LOW (ref 1.15–1.40)
Calcium, Ion: 1.09 mmol/L — ABNORMAL LOW (ref 1.15–1.40)
Calcium, Ion: 1.19 mmol/L (ref 1.15–1.40)
Calcium, Ion: 1.19 mmol/L (ref 1.15–1.40)
Calcium, Ion: 1.27 mmol/L (ref 1.15–1.40)
HCT: 46 % (ref 39.0–52.0)
HCT: 35 % — ABNORMAL LOW (ref 39.0–52.0)
HCT: 33 % — ABNORMAL LOW (ref 39.0–52.0)
HCT: 36 % — ABNORMAL LOW (ref 39.0–52.0)
HCT: 39 % (ref 39.0–52.0)
HCT: 40 % (ref 39.0–52.0)
Hemoglobin: 15.6 g/dL (ref 13.0–17.0)
Hemoglobin: 11.9 g/dL — ABNORMAL LOW (ref 13.0–17.0)
Hemoglobin: 11.2 g/dL — ABNORMAL LOW (ref 13.0–17.0)
Hemoglobin: 12.2 g/dL — ABNORMAL LOW (ref 13.0–17.0)
Hemoglobin: 13.3 g/dL (ref 13.0–17.0)
Hemoglobin: 13.6 g/dL (ref 13.0–17.0)
O2 Saturation: 100 %
O2 Saturation: 100 %
O2 Saturation: 100 %
O2 Saturation: 97 %
O2 Saturation: 98 %
O2 Saturation: 98 %
Patient temperature: 36
Patient temperature: 36.5
Patient temperature: 36.5
Potassium: 4.1 mmol/L (ref 3.5–5.1)
Potassium: 4.9 mmol/L (ref 3.5–5.1)
Potassium: 5.6 mmol/L — ABNORMAL HIGH (ref 3.5–5.1)
Potassium: 3.8 mmol/L (ref 3.5–5.1)
Potassium: 4.4 mmol/L (ref 3.5–5.1)
Potassium: 4.5 mmol/L (ref 3.5–5.1)
Sodium: 141 mmol/L (ref 135–145)
Sodium: 140 mmol/L (ref 135–145)
Sodium: 137 mmol/L (ref 135–145)
Sodium: 143 mmol/L (ref 135–145)
Sodium: 143 mmol/L (ref 135–145)
Sodium: 139 mmol/L (ref 135–145)
TCO2: 29 mmol/L (ref 22–32)
TCO2: 29 mmol/L (ref 22–32)
TCO2: 25 mmol/L (ref 22–32)
TCO2: 26 mmol/L (ref 22–32)
TCO2: 25 mmol/L (ref 22–32)
TCO2: 25 mmol/L (ref 22–32)
pCO2 arterial: 54.8 mmHg — ABNORMAL HIGH (ref 32.0–48.0)
pCO2 arterial: 46.4 mmHg (ref 32.0–48.0)
pCO2 arterial: 36.1 mmHg (ref 32.0–48.0)
pCO2 arterial: 44.9 mmHg (ref 32.0–48.0)
pCO2 arterial: 44.3 mmHg (ref 32.0–48.0)
pCO2 arterial: 45 mmHg (ref 32.0–48.0)
pH, Arterial: 7.304 — ABNORMAL LOW (ref 7.350–7.450)
pH, Arterial: 7.384 (ref 7.350–7.450)
pH, Arterial: 7.434 (ref 7.350–7.450)
pH, Arterial: 7.338 — ABNORMAL LOW (ref 7.350–7.450)
pH, Arterial: 7.326 — ABNORMAL LOW (ref 7.350–7.450)
pH, Arterial: 7.316 — ABNORMAL LOW (ref 7.350–7.450)
pO2, Arterial: 233 mmHg — ABNORMAL HIGH (ref 83.0–108.0)
pO2, Arterial: 328 mmHg — ABNORMAL HIGH (ref 83.0–108.0)
pO2, Arterial: 290 mmHg — ABNORMAL HIGH (ref 83.0–108.0)
pO2, Arterial: 97 mmHg (ref 83.0–108.0)
pO2, Arterial: 114 mmHg — ABNORMAL HIGH (ref 83.0–108.0)
pO2, Arterial: 107 mmHg (ref 83.0–108.0)

## 2019-09-04 LAB — BASIC METABOLIC PANEL
Anion gap: 8 (ref 5–15)
BUN: 16 mg/dL (ref 6–20)
CO2: 21 mmol/L — ABNORMAL LOW (ref 22–32)
Calcium: 8.6 mg/dL — ABNORMAL LOW (ref 8.9–10.3)
Chloride: 111 mmol/L (ref 98–111)
Creatinine, Ser: 1.27 mg/dL — ABNORMAL HIGH (ref 0.61–1.24)
GFR calc Af Amer: >60 mL/min (ref >60)
GFR calc non Af Amer: >60 mL/min (ref >60)
Glucose, Bld: 120 mg/dL — ABNORMAL HIGH (ref 70–99)
Potassium: 4.7 mmol/L (ref 3.5–5.1)
Sodium: 140 mmol/L (ref 135–145)

## 2019-09-04 LAB — CBC
HCT: 37.9 % — ABNORMAL LOW (ref 39.0–52.0)
HCT: 42.9 % (ref 39.0–52.0)
Hemoglobin: 12.7 g/dL — ABNORMAL LOW (ref 13.0–17.0)
Hemoglobin: 14.1 g/dL (ref 13.0–17.0)
MCH: 29 pg (ref 26.0–34.0)
MCH: 28.8 pg (ref 26.0–34.0)
MCHC: 33.5 g/dL (ref 30.0–36.0)
MCHC: 32.9 g/dL (ref 30.0–36.0)
MCV: 86.5 fL (ref 80.0–100.0)
MCV: 87.6 fL (ref 80.0–100.0)
Platelets: 139 10*3/uL — ABNORMAL LOW (ref 150–400)
Platelets: 179 10*3/uL (ref 150–400)
RBC: 4.38 MIL/uL (ref 4.22–5.81)
RBC: 4.9 MIL/uL (ref 4.22–5.81)
RDW: 12.5 % (ref 11.5–15.5)
RDW: 12.5 % (ref 11.5–15.5)
WBC: 14.2 10*3/uL — ABNORMAL HIGH (ref 4.0–10.5)
WBC: 17.8 10*3/uL — ABNORMAL HIGH (ref 4.0–10.5)
nRBC: 0 % (ref 0.0–0.2)
nRBC: 0 % (ref 0.0–0.2)

## 2019-09-04 LAB — POCT I-STAT, CHEM 8
BUN: 21 mg/dL — ABNORMAL HIGH (ref 6–20)
BUN: 21 mg/dL — ABNORMAL HIGH (ref 6–20)
BUN: 18 mg/dL (ref 6–20)
BUN: 19 mg/dL (ref 6–20)
BUN: 17 mg/dL (ref 6–20)
Calcium, Ion: 1.3 mmol/L (ref 1.15–1.40)
Calcium, Ion: 1.26 mmol/L (ref 1.15–1.40)
Calcium, Ion: 1.05 mmol/L — ABNORMAL LOW (ref 1.15–1.40)
Calcium, Ion: 1.09 mmol/L — ABNORMAL LOW (ref 1.15–1.40)
Calcium, Ion: 1.12 mmol/L — ABNORMAL LOW (ref 1.15–1.40)
Chloride: 102 mmol/L (ref 98–111)
Chloride: 104 mmol/L (ref 98–111)
Chloride: 101 mmol/L (ref 98–111)
Chloride: 102 mmol/L (ref 98–111)
Chloride: 104 mmol/L (ref 98–111)
Creatinine, Ser: 1.1 mg/dL (ref 0.61–1.24)
Creatinine, Ser: 1.3 mg/dL — ABNORMAL HIGH (ref 0.61–1.24)
Creatinine, Ser: 1.1 mg/dL (ref 0.61–1.24)
Creatinine, Ser: 1.2 mg/dL (ref 0.61–1.24)
Creatinine, Ser: 1.2 mg/dL (ref 0.61–1.24)
Glucose, Bld: 117 mg/dL — ABNORMAL HIGH (ref 70–99)
Glucose, Bld: 113 mg/dL — ABNORMAL HIGH (ref 70–99)
Glucose, Bld: 103 mg/dL — ABNORMAL HIGH (ref 70–99)
Glucose, Bld: 117 mg/dL — ABNORMAL HIGH (ref 70–99)
Glucose, Bld: 108 mg/dL — ABNORMAL HIGH (ref 70–99)
HCT: 46 % (ref 39.0–52.0)
HCT: 42 % (ref 39.0–52.0)
HCT: 33 % — ABNORMAL LOW (ref 39.0–52.0)
HCT: 34 % — ABNORMAL LOW (ref 39.0–52.0)
HCT: 33 % — ABNORMAL LOW (ref 39.0–52.0)
Hemoglobin: 15.6 g/dL (ref 13.0–17.0)
Hemoglobin: 14.3 g/dL (ref 13.0–17.0)
Hemoglobin: 11.2 g/dL — ABNORMAL LOW (ref 13.0–17.0)
Hemoglobin: 11.6 g/dL — ABNORMAL LOW (ref 13.0–17.0)
Hemoglobin: 11.2 g/dL — ABNORMAL LOW (ref 13.0–17.0)
Potassium: 4.1 mmol/L (ref 3.5–5.1)
Potassium: 4.7 mmol/L (ref 3.5–5.1)
Potassium: 5.3 mmol/L — ABNORMAL HIGH (ref 3.5–5.1)
Potassium: 5.5 mmol/L — ABNORMAL HIGH (ref 3.5–5.1)
Potassium: 4.4 mmol/L (ref 3.5–5.1)
Sodium: 142 mmol/L (ref 135–145)
Sodium: 139 mmol/L (ref 135–145)
Sodium: 136 mmol/L (ref 135–145)
Sodium: 138 mmol/L (ref 135–145)
Sodium: 141 mmol/L (ref 135–145)
TCO2: 26 mmol/L (ref 22–32)
TCO2: 25 mmol/L (ref 22–32)
TCO2: 24 mmol/L (ref 22–32)
TCO2: 25 mmol/L (ref 22–32)
TCO2: 22 mmol/L (ref 22–32)

## 2019-09-04 LAB — GLUCOSE, CAPILLARY
Glucose-Capillary: 109 mg/dL — ABNORMAL HIGH (ref 70–99)
Glucose-Capillary: 109 mg/dL — ABNORMAL HIGH (ref 70–99)
Glucose-Capillary: 114 mg/dL — ABNORMAL HIGH (ref 70–99)
Glucose-Capillary: 117 mg/dL — ABNORMAL HIGH (ref 70–99)
Glucose-Capillary: 48 mg/dL — ABNORMAL LOW (ref 70–99)
Glucose-Capillary: 72 mg/dL (ref 70–99)

## 2019-09-04 LAB — PROTIME-INR
INR: 1.4 — ABNORMAL HIGH (ref 0.8–1.2)
Prothrombin Time: 16.5 seconds — ABNORMAL HIGH (ref 11.4–15.2)

## 2019-09-04 LAB — PLATELET COUNT: Platelets: 158 10*3/uL (ref 150–400)

## 2019-09-04 LAB — APTT: aPTT: 40 seconds — ABNORMAL HIGH (ref 24–36)

## 2019-09-04 LAB — MAGNESIUM: Magnesium: 2.7 mg/dL — ABNORMAL HIGH (ref 1.7–2.4)

## 2019-09-04 SURGERY — CORONARY ARTERY BYPASS GRAFTING (CABG)
Anesthesia: General | Site: Leg Upper | Laterality: Right

## 2019-09-04 MED ORDER — LACTATED RINGERS IV SOLN
INTRAVENOUS | Status: DC | PRN
Start: 2019-09-04 — End: 2019-09-04

## 2019-09-04 MED ORDER — POTASSIUM CHLORIDE 10 MEQ/50ML IV SOLN
10.0000 meq | INTRAVENOUS | Status: AC
  Administered 2019-09-04 (×3): 10 meq via INTRAVENOUS

## 2019-09-04 MED ORDER — LACTATED RINGERS IV SOLN: INTRAVENOUS | Status: DC | PRN

## 2019-09-04 MED ORDER — ACETAMINOPHEN 500 MG PO TABS
1000.0000 mg | ORAL_TABLET | Freq: Four times a day (QID) | ORAL | Status: DC
  Administered 2019-09-04 – 2019-09-08 (×13): 1000 mg via ORAL
  Filled 2019-09-04 (×12): qty 2

## 2019-09-04 MED ORDER — NITROGLYCERIN 0.2 MG/ML ON CALL CATH LAB
INTRAVENOUS | Status: DC | PRN
  Administered 2019-09-04: 80 ug via INTRAVENOUS
  Administered 2019-09-04 (×2): 40 ug via INTRAVENOUS
  Administered 2019-09-04: 20 ug via INTRAVENOUS
  Administered 2019-09-04 (×3): 40 ug via INTRAVENOUS
  Administered 2019-09-04: 60 ug via INTRAVENOUS
  Administered 2019-09-04: 20 ug via INTRAVENOUS

## 2019-09-04 MED ORDER — ROCURONIUM BROMIDE 10 MG/ML (PF) SYRINGE
PREFILLED_SYRINGE | INTRAVENOUS | Status: AC
  Filled 2019-09-04: qty 10

## 2019-09-04 MED ORDER — CHLORHEXIDINE GLUCONATE CLOTH 2 % EX PADS: 6.0000 | MEDICATED_PAD | Freq: Every day | CUTANEOUS | Status: DC

## 2019-09-04 MED ORDER — ROCURONIUM BROMIDE 10 MG/ML (PF) SYRINGE
PREFILLED_SYRINGE | INTRAVENOUS | Status: DC | PRN
  Administered 2019-09-04: 20 mg via INTRAVENOUS
  Administered 2019-09-04: 80 mg via INTRAVENOUS
  Administered 2019-09-04 (×2): 50 mg via INTRAVENOUS
  Administered 2019-09-04: 30 mg via INTRAVENOUS

## 2019-09-04 MED ORDER — SODIUM CHLORIDE 0.9 % IV SOLN: INTRAVENOUS | Status: DC | PRN

## 2019-09-04 MED ORDER — ALBUMIN HUMAN 5 % IV SOLN
250.0000 mL | INTRAVENOUS | Status: DC | PRN
  Administered 2019-09-04 (×3): 12.5 g via INTRAVENOUS
  Filled 2019-09-04: qty 250

## 2019-09-04 MED ORDER — MIDAZOLAM HCL (PF) 10 MG/2ML IJ SOLN
INTRAMUSCULAR | Status: AC
  Filled 2019-09-04: qty 2

## 2019-09-04 MED ORDER — FENTANYL CITRATE (PF) 250 MCG/5ML IJ SOLN
INTRAMUSCULAR | Status: DC | PRN
  Administered 2019-09-04: 200 ug via INTRAVENOUS
  Administered 2019-09-04 (×3): 100 ug via INTRAVENOUS
  Administered 2019-09-04: 25 ug via INTRAVENOUS
  Administered 2019-09-04: 100 ug via INTRAVENOUS
  Administered 2019-09-04: 50 ug via INTRAVENOUS
  Administered 2019-09-04: 175 ug via INTRAVENOUS
  Administered 2019-09-04 (×4): 100 ug via INTRAVENOUS
  Administered 2019-09-04: 50 ug via INTRAVENOUS
  Administered 2019-09-04 (×2): 100 ug via INTRAVENOUS

## 2019-09-04 MED ORDER — SODIUM CHLORIDE 0.9 % IV SOLN: INTRAVENOUS | Status: DC

## 2019-09-04 MED ORDER — TRAMADOL HCL 50 MG PO TABS
50.0000 mg | ORAL_TABLET | ORAL | Status: DC | PRN
  Administered 2019-09-04 – 2019-09-07 (×6): 100 mg via ORAL
  Filled 2019-09-04 (×6): qty 2

## 2019-09-04 MED ORDER — BISACODYL 10 MG RE SUPP: 10.0000 mg | Freq: Every day | RECTAL | Status: DC

## 2019-09-04 MED ORDER — ARTIFICIAL TEARS OPHTHALMIC OINT
TOPICAL_OINTMENT | OPHTHALMIC | Status: AC
  Filled 2019-09-04: qty 3.5

## 2019-09-04 MED ORDER — ASPIRIN 81 MG PO CHEW: 324.0000 mg | CHEWABLE_TABLET | Freq: Every day | ORAL | Status: DC

## 2019-09-04 MED ORDER — PLASMA-LYTE 148 IV SOLN
INTRAVENOUS | Status: DC | PRN
  Administered 2019-09-04: 500 mL

## 2019-09-04 MED ORDER — THROMBIN 20000 UNITS EX SOLR
CUTANEOUS | Status: DC | PRN
  Administered 2019-09-04: 20000 [IU] via TOPICAL

## 2019-09-04 MED ORDER — CHLORHEXIDINE GLUCONATE 0.12 % MT SOLN
15.0000 mL | Freq: Once | OROMUCOSAL | Status: AC
  Administered 2019-09-04: 15 mL via OROMUCOSAL
  Filled 2019-09-04: qty 15

## 2019-09-04 MED ORDER — ACETAMINOPHEN 650 MG RE SUPP
650.0000 mg | Freq: Once | RECTAL | Status: AC
  Administered 2019-09-04: 650 mg via RECTAL

## 2019-09-04 MED ORDER — VILAZODONE HCL 20 MG PO TABS
40.0000 mg | ORAL_TABLET | Freq: Every day | ORAL | Status: DC
  Administered 2019-09-05 – 2019-09-09 (×5): 40 mg via ORAL
  Filled 2019-09-04 (×5): qty 2

## 2019-09-04 MED ORDER — VANCOMYCIN HCL IN DEXTROSE 1-5 GM/200ML-% IV SOLN
1000.0000 mg | Freq: Once | INTRAVENOUS | Status: AC
  Administered 2019-09-04: 1000 mg via INTRAVENOUS
  Filled 2019-09-04: qty 200

## 2019-09-04 MED ORDER — PHENYLEPHRINE HCL-NACL 20-0.9 MG/250ML-% IV SOLN
0.0000 ug/min | INTRAVENOUS | Status: DC
  Filled 2019-09-04 (×2): qty 250

## 2019-09-04 MED ORDER — METOPROLOL TARTRATE 5 MG/5ML IV SOLN
2.5000 mg | INTRAVENOUS | Status: DC | PRN
  Administered 2019-09-05 – 2019-09-06 (×4): 2.5 mg via INTRAVENOUS
  Administered 2019-09-06: 5 mg via INTRAVENOUS
  Filled 2019-09-04 (×4): qty 5

## 2019-09-04 MED ORDER — ROSUVASTATIN CALCIUM 20 MG PO TABS: 40.0000 mg | ORAL_TABLET | Freq: Every day | ORAL | Status: DC

## 2019-09-04 MED ORDER — CHLORHEXIDINE GLUCONATE CLOTH 2 % EX PADS
6.0000 | MEDICATED_PAD | Freq: Every day | CUTANEOUS | Status: DC
  Administered 2019-09-04 – 2019-09-06 (×3): 6 via TOPICAL

## 2019-09-04 MED ORDER — SODIUM CHLORIDE 0.9% FLUSH
3.0000 mL | Freq: Two times a day (BID) | INTRAVENOUS | Status: DC
  Administered 2019-09-05 – 2019-09-07 (×3): 3 mL via INTRAVENOUS
  Administered 2019-09-07: 10 mL via INTRAVENOUS

## 2019-09-04 MED ORDER — ARTIFICIAL TEARS OPHTHALMIC OINT
TOPICAL_OINTMENT | OPHTHALMIC | Status: DC | PRN
  Administered 2019-09-04: 1 via OPHTHALMIC

## 2019-09-04 MED ORDER — MAGNESIUM SULFATE 4 GM/100ML IV SOLN
4.0000 g | Freq: Once | INTRAVENOUS | Status: AC
  Administered 2019-09-04: 4 g via INTRAVENOUS

## 2019-09-04 MED ORDER — INSULIN ASPART 100 UNIT/ML ~~LOC~~ SOLN
0.0000 [IU] | SUBCUTANEOUS | Status: DC
  Administered 2019-09-05 – 2019-09-06 (×5): 2 [IU] via SUBCUTANEOUS

## 2019-09-04 MED ORDER — ACETAMINOPHEN 160 MG/5ML PO SOLN: 650.0000 mg | Freq: Once | ORAL | Status: AC

## 2019-09-04 MED ORDER — INSULIN REGULAR(HUMAN) IN NACL 100-0.9 UT/100ML-% IV SOLN: INTRAVENOUS | Status: DC

## 2019-09-04 MED ORDER — BISACODYL 5 MG PO TBEC
10.0000 mg | DELAYED_RELEASE_TABLET | Freq: Every day | ORAL | Status: DC
  Administered 2019-09-05 – 2019-09-06 (×2): 10 mg via ORAL
  Filled 2019-09-04 (×2): qty 2

## 2019-09-04 MED ORDER — SODIUM CHLORIDE 0.9 % IV SOLN: 250.0000 mL | INTRAVENOUS | Status: DC

## 2019-09-04 MED ORDER — DEXTROSE 50 % IV SOLN
0.0000 mL | INTRAVENOUS | Status: DC | PRN
  Filled 2019-09-04: qty 50

## 2019-09-04 MED ORDER — PROTAMINE SULFATE 10 MG/ML IV SOLN
INTRAVENOUS | Status: DC | PRN
  Administered 2019-09-04 (×3): 70 mg via INTRAVENOUS
  Administered 2019-09-04: 20 mg via INTRAVENOUS
  Administered 2019-09-04: 30 mg via INTRAVENOUS
  Administered 2019-09-04: 40 mg via INTRAVENOUS

## 2019-09-04 MED ORDER — DOCUSATE SODIUM 100 MG PO CAPS
200.0000 mg | ORAL_CAPSULE | Freq: Every day | ORAL | Status: DC
  Administered 2019-09-05 – 2019-09-06 (×2): 200 mg via ORAL
  Filled 2019-09-04 (×3): qty 2

## 2019-09-04 MED ORDER — SODIUM CHLORIDE 0.45 % IV SOLN: INTRAVENOUS | Status: DC | PRN

## 2019-09-04 MED ORDER — FENTANYL CITRATE (PF) 250 MCG/5ML IJ SOLN
INTRAMUSCULAR | Status: AC
  Filled 2019-09-04: qty 5

## 2019-09-04 MED ORDER — PROTAMINE SULFATE 10 MG/ML IV SOLN
INTRAVENOUS | Status: AC
  Filled 2019-09-04: qty 5

## 2019-09-04 MED ORDER — PHENYLEPHRINE HCL-NACL 10-0.9 MG/250ML-% IV SOLN: INTRAVENOUS | Status: DC | PRN

## 2019-09-04 MED ORDER — PANTOPRAZOLE SODIUM 40 MG PO TBEC
40.0000 mg | DELAYED_RELEASE_TABLET | Freq: Every day | ORAL | Status: DC
  Administered 2019-09-06 – 2019-09-07 (×2): 40 mg via ORAL
  Filled 2019-09-04 (×3): qty 1

## 2019-09-04 MED ORDER — VILAZODONE HCL 40 MG PO TABS: 40.0000 mg | ORAL_TABLET | Freq: Every day | ORAL | Status: DC

## 2019-09-04 MED ORDER — LACTATED RINGERS IV SOLN: 500.0000 mL | Freq: Once | INTRAVENOUS | Status: DC | PRN

## 2019-09-04 MED ORDER — CHLORHEXIDINE GLUCONATE 0.12 % MT SOLN
15.0000 mL | OROMUCOSAL | Status: AC
  Administered 2019-09-04: 15 mL via OROMUCOSAL

## 2019-09-04 MED ORDER — ALBUMIN HUMAN 5 % IV SOLN: INTRAVENOUS | Status: DC | PRN

## 2019-09-04 MED ORDER — ORAL CARE MOUTH RINSE
15.0000 mL | Freq: Two times a day (BID) | OROMUCOSAL | Status: DC
  Administered 2019-09-04 – 2019-09-07 (×7): 15 mL via OROMUCOSAL

## 2019-09-04 MED ORDER — SODIUM CHLORIDE 0.9% FLUSH: 3.0000 mL | INTRAVENOUS | Status: DC | PRN

## 2019-09-04 MED ORDER — LACTATED RINGERS IV SOLN: INTRAVENOUS | Status: DC

## 2019-09-04 MED ORDER — METOPROLOL TARTRATE 12.5 MG HALF TABLET: 12.5000 mg | ORAL_TABLET | Freq: Once | ORAL | Status: AC

## 2019-09-04 MED ORDER — PROPOFOL 10 MG/ML IV BOLUS
INTRAVENOUS | Status: AC
  Filled 2019-09-04: qty 20

## 2019-09-04 MED ORDER — FENTANYL CITRATE (PF) 250 MCG/5ML IJ SOLN
INTRAMUSCULAR | Status: AC
  Filled 2019-09-04: qty 25

## 2019-09-04 MED ORDER — METOPROLOL TARTRATE 25 MG/10 ML ORAL SUSPENSION: 12.5000 mg | Freq: Two times a day (BID) | ORAL | Status: DC

## 2019-09-04 MED ORDER — METOPROLOL TARTRATE 12.5 MG HALF TABLET
12.5000 mg | ORAL_TABLET | Freq: Two times a day (BID) | ORAL | Status: DC
  Administered 2019-09-05 – 2019-09-06 (×4): 12.5 mg via ORAL
  Filled 2019-09-04 (×5): qty 1

## 2019-09-04 MED ORDER — PROTAMINE SULFATE 10 MG/ML IV SOLN
INTRAVENOUS | Status: AC
  Filled 2019-09-04: qty 25

## 2019-09-04 MED ORDER — THROMBIN (RECOMBINANT) 20000 UNITS EX SOLR
CUTANEOUS | Status: AC
  Filled 2019-09-04: qty 20000

## 2019-09-04 MED ORDER — PHENYLEPHRINE 40 MCG/ML (10ML) SYRINGE FOR IV PUSH (FOR BLOOD PRESSURE SUPPORT)
PREFILLED_SYRINGE | INTRAVENOUS | Status: DC | PRN
  Administered 2019-09-04: 20 ug via INTRAVENOUS

## 2019-09-04 MED ORDER — PROPOFOL 10 MG/ML IV BOLUS
INTRAVENOUS | Status: DC | PRN
  Administered 2019-09-04: 50 mg via INTRAVENOUS
  Administered 2019-09-04: 20 mg via INTRAVENOUS
  Administered 2019-09-04: 50 mg via INTRAVENOUS
  Administered 2019-09-04 (×2): 20 mg via INTRAVENOUS

## 2019-09-04 MED ORDER — HEPARIN SODIUM (PORCINE) 1000 UNIT/ML IJ SOLN
INTRAMUSCULAR | Status: AC
  Filled 2019-09-04: qty 1

## 2019-09-04 MED ORDER — CHLORHEXIDINE GLUCONATE 4 % EX LIQD: 30.0000 mL | CUTANEOUS | Status: DC

## 2019-09-04 MED ORDER — ACETAMINOPHEN 160 MG/5ML PO SOLN: 1000.0000 mg | Freq: Four times a day (QID) | ORAL | Status: DC

## 2019-09-04 MED ORDER — MIDAZOLAM HCL 5 MG/5ML IJ SOLN
INTRAMUSCULAR | Status: DC | PRN
  Administered 2019-09-04 (×2): 2 mg via INTRAVENOUS
  Administered 2019-09-04: 3 mg via INTRAVENOUS
  Administered 2019-09-04: 1 mg via INTRAVENOUS
  Administered 2019-09-04: 2 mg via INTRAVENOUS

## 2019-09-04 MED ORDER — OXYCODONE HCL 5 MG PO TABS
5.0000 mg | ORAL_TABLET | ORAL | Status: DC | PRN
  Administered 2019-09-04: 5 mg via ORAL
  Administered 2019-09-04 – 2019-09-05 (×3): 10 mg via ORAL
  Administered 2019-09-05: 5 mg via ORAL
  Administered 2019-09-05 – 2019-09-06 (×2): 10 mg via ORAL
  Filled 2019-09-04 (×3): qty 2
  Filled 2019-09-04: qty 1
  Filled 2019-09-04 (×3): qty 2

## 2019-09-04 MED ORDER — MORPHINE SULFATE (PF) 2 MG/ML IV SOLN
1.0000 mg | INTRAVENOUS | Status: DC | PRN
  Administered 2019-09-04: 4 mg via INTRAVENOUS
  Administered 2019-09-04 (×5): 2 mg via INTRAVENOUS
  Administered 2019-09-05: 4 mg via INTRAVENOUS
  Administered 2019-09-05 (×4): 2 mg via INTRAVENOUS
  Filled 2019-09-04 (×8): qty 1
  Filled 2019-09-04: qty 2
  Filled 2019-09-04: qty 1
  Filled 2019-09-04: qty 2
  Filled 2019-09-04 (×2): qty 1

## 2019-09-04 MED ORDER — ONDANSETRON HCL 4 MG/2ML IJ SOLN
4.0000 mg | Freq: Four times a day (QID) | INTRAMUSCULAR | Status: DC | PRN
  Administered 2019-09-05: 4 mg via INTRAVENOUS
  Filled 2019-09-04 (×2): qty 2

## 2019-09-04 MED ORDER — HEMOSTATIC AGENTS (NO CHARGE) OPTIME
TOPICAL | Status: DC | PRN
  Administered 2019-09-04 (×4): 1 via TOPICAL

## 2019-09-04 MED ORDER — FAMOTIDINE IN NACL 20-0.9 MG/50ML-% IV SOLN
20.0000 mg | Freq: Two times a day (BID) | INTRAVENOUS | Status: AC
  Administered 2019-09-04: 20 mg via INTRAVENOUS

## 2019-09-04 MED ORDER — ASPIRIN EC 325 MG PO TBEC
325.0000 mg | DELAYED_RELEASE_TABLET | Freq: Every day | ORAL | Status: DC
  Administered 2019-09-05 – 2019-09-07 (×3): 325 mg via ORAL
  Filled 2019-09-04 (×4): qty 1

## 2019-09-04 MED ORDER — HEPARIN SODIUM (PORCINE) 1000 UNIT/ML IJ SOLN
INTRAMUSCULAR | Status: DC | PRN
  Administered 2019-09-04: 30000 [IU] via INTRAVENOUS

## 2019-09-04 MED ORDER — METOPROLOL TARTRATE 12.5 MG HALF TABLET
ORAL_TABLET | ORAL | Status: AC
  Administered 2019-09-04: 12.5 mg via ORAL
  Filled 2019-09-04: qty 1

## 2019-09-04 MED ORDER — MIDAZOLAM HCL 2 MG/2ML IJ SOLN: 2.0000 mg | INTRAMUSCULAR | Status: DC | PRN

## 2019-09-04 MED ORDER — 0.9 % SODIUM CHLORIDE (POUR BTL) OPTIME
TOPICAL | Status: DC | PRN
Start: 2019-09-04 — End: 2019-09-04
  Administered 2019-09-04: 5000 mL

## 2019-09-04 MED ORDER — SODIUM CHLORIDE 0.9 % IV SOLN
1.5000 g | Freq: Two times a day (BID) | INTRAVENOUS | Status: AC
  Administered 2019-09-04 – 2019-09-06 (×4): 1.5 g via INTRAVENOUS
  Filled 2019-09-04 (×4): qty 1.5

## 2019-09-04 MED ORDER — DEXMEDETOMIDINE HCL IN NACL 400 MCG/100ML IV SOLN: 0.0000 ug/kg/h | INTRAVENOUS | Status: DC

## 2019-09-04 MED ORDER — NITROGLYCERIN IN D5W 200-5 MCG/ML-% IV SOLN: 0.0000 ug/min | INTRAVENOUS | Status: DC

## 2019-09-04 SURGICAL SUPPLY — 88 items
ADH SKN CLS APL DERMABOND .7 (GAUZE/BANDAGES/DRESSINGS) ×3
BAG DECANTER FOR FLEXI CONT (MISCELLANEOUS) ×4 IMPLANT
BLADE CLIPPER SURG (BLADE) ×4 IMPLANT
BLADE STERNUM SYSTEM 6 (BLADE) ×4 IMPLANT
BLADE SURG 11 STRL SS (BLADE) ×1 IMPLANT
BNDG ELASTIC 4X5.8 VLCR STR LF (GAUZE/BANDAGES/DRESSINGS) ×4 IMPLANT
BNDG ELASTIC 6X5.8 VLCR STR LF (GAUZE/BANDAGES/DRESSINGS) ×4 IMPLANT
BNDG GAUZE ELAST 4 BULKY (GAUZE/BANDAGES/DRESSINGS) ×4 IMPLANT
CANISTER SUCT 3000ML PPV (MISCELLANEOUS) ×5 IMPLANT
CATH ROBINSON RED A/P 18FR (CATHETERS) ×8 IMPLANT
CATH THORACIC 28FR (CATHETERS) ×4 IMPLANT
CATH THORACIC 36FR (CATHETERS) ×4 IMPLANT
CATH THORACIC 36FR RT ANG (CATHETERS) ×4 IMPLANT
CLIP VESOCCLUDE MED 24/CT (CLIP) IMPLANT
CLIP VESOCCLUDE SM WIDE 24/CT (CLIP) ×2 IMPLANT
DEFOGGER ANTIFOG KIT (MISCELLANEOUS) ×1 IMPLANT
DERMABOND ADVANCED (GAUZE/BANDAGES/DRESSINGS) ×1
DERMABOND ADVANCED .7 DNX12 (GAUZE/BANDAGES/DRESSINGS) IMPLANT
DRAPE CARDIOVASCULAR INCISE (DRAPES) ×4
DRAPE SLUSH/WARMER DISC (DRAPES) ×4 IMPLANT
DRAPE SRG 135X102X78XABS (DRAPES) ×3 IMPLANT
DRSG COVADERM 4X14 (GAUZE/BANDAGES/DRESSINGS) ×4 IMPLANT
ELECT CAUTERY BLADE 6.4 (BLADE) ×5 IMPLANT
ELECT REM PT RETURN 9FT ADLT (ELECTROSURGICAL) ×8
ELECTRODE REM PT RTRN 9FT ADLT (ELECTROSURGICAL) ×6 IMPLANT
FELT TEFLON 1X6 (MISCELLANEOUS) ×8 IMPLANT
GAUZE SPONGE 4X4 12PLY STRL (GAUZE/BANDAGES/DRESSINGS) ×8 IMPLANT
GAUZE SPONGE 4X4 12PLY STRL LF (GAUZE/BANDAGES/DRESSINGS) ×2 IMPLANT
GLOVE BIO SURGEON STRL SZ 6 (GLOVE) ×6 IMPLANT
GLOVE BIO SURGEON STRL SZ 6.5 (GLOVE) ×1 IMPLANT
GLOVE BIO SURGEON STRL SZ7 (GLOVE) IMPLANT
GLOVE BIO SURGEON STRL SZ7.5 (GLOVE) IMPLANT
GLOVE BIOGEL PI IND STRL 6 (GLOVE) IMPLANT
GLOVE BIOGEL PI IND STRL 6.5 (GLOVE) ×1 IMPLANT
GLOVE BIOGEL PI IND STRL 7.0 (GLOVE) IMPLANT
GLOVE BIOGEL PI INDICATOR 6 (GLOVE) ×1
GLOVE BIOGEL PI INDICATOR 6.5 (GLOVE) ×1
GLOVE BIOGEL PI INDICATOR 7.0 (GLOVE)
GLOVE ORTHO TXT STRL SZ7.5 (GLOVE) IMPLANT
GLOVE SURG SS PI 6.0 STRL IVOR (GLOVE) ×2 IMPLANT
GLOVE TRIUMPH SURG SIZE 7.0 (KITS) ×2 IMPLANT
GOWN STRL REUS W/ TWL LRG LVL3 (GOWN DISPOSABLE) ×12 IMPLANT
GOWN STRL REUS W/ TWL XL LVL3 (GOWN DISPOSABLE) ×3 IMPLANT
GOWN STRL REUS W/TWL LRG LVL3 (GOWN DISPOSABLE) ×36
GOWN STRL REUS W/TWL XL LVL3 (GOWN DISPOSABLE) ×4
HEMOSTAT POWDER SURGIFOAM 1G (HEMOSTASIS) ×12 IMPLANT
HEMOSTAT SURGICEL 2X14 (HEMOSTASIS) ×4 IMPLANT
KIT BASIN OR (CUSTOM PROCEDURE TRAY) ×4 IMPLANT
KIT CATH CPB BARTLE (MISCELLANEOUS) ×4 IMPLANT
KIT SUCTION CATH 14FR (SUCTIONS) ×4 IMPLANT
KIT TURNOVER KIT B (KITS) ×4 IMPLANT
KIT VASOVIEW HEMOPRO 2 VH 4000 (KITS) ×4 IMPLANT
NS IRRIG 1000ML POUR BTL (IV SOLUTION) ×20 IMPLANT
PACK E OPEN HEART (SUTURE) ×4 IMPLANT
PACK OPEN HEART (CUSTOM PROCEDURE TRAY) ×4 IMPLANT
PAD ARMBOARD 7.5X6 YLW CONV (MISCELLANEOUS) ×8 IMPLANT
PAD ELECT DEFIB RADIOL ZOLL (MISCELLANEOUS) ×4 IMPLANT
PENCIL BUTTON HOLSTER BLD 10FT (ELECTRODE) ×5 IMPLANT
POSITIONER HEAD DONUT 9IN (MISCELLANEOUS) ×4 IMPLANT
PUNCH AORTIC ROTATE 4.5MM 8IN (MISCELLANEOUS) ×4 IMPLANT
SET CARDIOPLEGIA MPS 5001102 (MISCELLANEOUS) ×2 IMPLANT
SPONGE LAP 18X18 RF (DISPOSABLE) ×2 IMPLANT
SUPPORT HEART JANKE-BARRON (MISCELLANEOUS) ×4 IMPLANT
SUT BONE WAX W31G (SUTURE) ×4 IMPLANT
SUT MNCRL AB 4-0 PS2 18 (SUTURE) ×1 IMPLANT
SUT PROLENE 3 0 SH1 36 (SUTURE) ×4 IMPLANT
SUT PROLENE 6 0 C 1 30 (SUTURE) ×4 IMPLANT
SUT PROLENE 7 0 BV 1 (SUTURE) IMPLANT
SUT PROLENE 7 0 BV1 MDA (SUTURE) ×4 IMPLANT
SUT SILK  1 MH (SUTURE)
SUT SILK 1 MH (SUTURE) IMPLANT
SUT SILK 2 0 SH CR/8 (SUTURE) ×2 IMPLANT
SUT STEEL STERNAL CCS#1 18IN (SUTURE) ×2 IMPLANT
SUT STEEL SZ 6 DBL 3X14 BALL (SUTURE) ×2 IMPLANT
SUT VIC AB 1 CT1 27 (SUTURE) ×4
SUT VIC AB 1 CT1 27XBRD ANBCTR (SUTURE) IMPLANT
SUT VIC AB 1 CTX 36 (SUTURE) ×12
SUT VIC AB 1 CTX36XBRD ANBCTR (SUTURE) ×6 IMPLANT
SYSTEM SAHARA CHEST DRAIN ATS (WOUND CARE) ×4 IMPLANT
TAPE CLOTH SURG 4X10 WHT LF (GAUZE/BANDAGES/DRESSINGS) ×1 IMPLANT
TAPE PAPER 2X10 WHT MICROPORE (GAUZE/BANDAGES/DRESSINGS) ×1 IMPLANT
TOWEL GREEN STERILE (TOWEL DISPOSABLE) ×4 IMPLANT
TOWEL GREEN STERILE FF (TOWEL DISPOSABLE) ×4 IMPLANT
TRAY FOLEY SLVR 16FR TEMP STAT (SET/KITS/TRAYS/PACK) ×4 IMPLANT
TUBE CONNECTING 20X1/4 (TUBING) ×1 IMPLANT
TUBING LAP HI FLOW INSUFFLATIO (TUBING) ×4 IMPLANT
UNDERPAD 30X36 HEAVY ABSORB (UNDERPADS AND DIAPERS) ×4 IMPLANT
WATER STERILE IRR 1000ML POUR (IV SOLUTION) ×8 IMPLANT

## 2019-09-04 NOTE — Progress Notes (Signed)
  Echocardiogram Echocardiogram Transesophageal has been performed.  Bobbye Charleston 09/04/2019, 8:58 AM

## 2019-09-04 NOTE — Transfer of Care (Addendum)
Immediate Anesthesia Transfer of Care Note  Patient: Daniel Buck  Procedure(s) Performed: CORONARY ARTERY BYPASS GRAFTING (CABG) using LIMA to LAD; Endoscopic Vein Harvest of Right Greater Saphenous: SVG to Diag1; SVG to OM1; SVG to PL. (N/A Chest) TRANSESOPHAGEAL ECHOCARDIOGRAM (TEE) (N/A ) ENDOVEIN HARVEST OF GREATER SAPHENOUS VEIN (Right Leg Upper)  Patient Location: SICU  Anesthesia Type:General  Level of Consciousness: sedated, unresponsive and Patient remains intubated per anesthesia plan  Airway & Oxygen Therapy: Patient remains intubated per anesthesia plan and Patient placed on Ventilator (see vital sign flow sheet for setting)  Post-op Assessment: Report given to RN and Post -op Vital signs reviewed and stable  Post vital signs: Reviewed and stable  Last Vitals:  Vitals Value Taken Time  BP    Temp    Pulse    Resp    SpO2      Last Pain:  Vitals:   09/04/19 0621  TempSrc:   PainSc: 0-No pain      Patients Stated Pain Goal: 2 (24/09/73 5329)  Complications: No complications documented.

## 2019-09-04 NOTE — Anesthesia Procedure Notes (Signed)
Arterial Line Insertion Start/End6/12/2019 6:55 AM, 09/04/2019 7:05 AM Performed by: Harden Mo, CRNA, CRNA  Patient location: Pre-op. Preanesthetic checklist: patient identified, IV checked, site marked, risks and benefits discussed, surgical consent, monitors and equipment checked, pre-op evaluation, timeout performed and anesthesia consent Lidocaine 1% used for infiltration and patient sedated Left, radial was placed Catheter size: 20 G Hand hygiene performed  and maximum sterile barriers used  Allen's test indicative of satisfactory collateral circulation Attempts: 1 Procedure performed without using ultrasound guided technique. Ultrasound Notes:anatomy identified, needle tip was noted to be adjacent to the nerve/plexus identified and no ultrasound evidence of intravascular and/or intraneural injection Following insertion, dressing applied and Biopatch. Post procedure assessment: normal  Patient tolerated the procedure well with no immediate complications.

## 2019-09-04 NOTE — Anesthesia Procedure Notes (Addendum)
Central Venous Catheter Insertion Performed by: Murvin Natal, MD, anesthesiologist Start/End6/12/2019 7:00 AM, 09/04/2019 7:15 AM Patient location: Pre-op. Preanesthetic checklist: patient identified, IV checked, site marked, risks and benefits discussed, surgical consent, monitors and equipment checked, pre-op evaluation, timeout performed and anesthesia consent Position: Trendelenburg Lidocaine 1% used for infiltration and patient sedated Hand hygiene performed , maximum sterile barriers used  and Seldinger technique used Catheter size: 9 Fr Total catheter length 12. MAC introducer Swan type:thermodilution PA Cath depth:49 Procedure performed using ultrasound guided technique. Ultrasound Notes:anatomy identified, needle tip was noted to be adjacent to the nerve/plexus identified and no ultrasound evidence of intravascular and/or intraneural injection Attempts: 1 Following insertion, line sutured, dressing applied and Biopatch. Post procedure assessment: blood return through all ports, free fluid flow and no air  Patient tolerated the procedure well with no immediate complications.

## 2019-09-04 NOTE — Interval H&P Note (Signed)
History and Physical Interval Note:  09/04/2019 7:04 AM  Daniel Buck  has presented today for surgery, with the diagnosis of CAD.  The various methods of treatment have been discussed with the patient and family. After consideration of risks, benefits and other options for treatment, the patient has consented to  Procedure(s): CORONARY ARTERY BYPASS GRAFTING (CABG) (N/A) TRANSESOPHAGEAL ECHOCARDIOGRAM (TEE) (N/A) as a surgical intervention.  The patient's history has been reviewed, patient examined, no change in status, stable for surgery.  I have reviewed the patient's chart and labs.  Questions were answered to the patient's satisfaction.     Gaye Pollack

## 2019-09-04 NOTE — Anesthesia Postprocedure Evaluation (Signed)
Anesthesia Post Note  Patient: Daniel Buck  Procedure(s) Performed: CORONARY ARTERY BYPASS GRAFTING (CABG) using LIMA to LAD; Endoscopic Vein Harvest of Right Greater Saphenous: SVG to Diag1; SVG to OM1; SVG to PL. (N/A Chest) TRANSESOPHAGEAL ECHOCARDIOGRAM (TEE) (N/A ) ENDOVEIN HARVEST OF GREATER SAPHENOUS VEIN (Right Leg Upper)     Patient location during evaluation: SICU Anesthesia Type: General Level of consciousness: patient remains intubated per anesthesia plan Pain management: pain level controlled Vital Signs Assessment: post-procedure vital signs reviewed and stable Respiratory status: patient remains intubated per anesthesia plan Cardiovascular status: stable Postop Assessment: no apparent nausea or vomiting Anesthetic complications: no   No complications documented.  Last Vitals:  Vitals:   09/04/19 1600 09/04/19 1615  BP: 106/78   Pulse: 84 84  Resp:    Temp: 36.5 C 36.5 C  SpO2: 100% 99%    Last Pain:  Vitals:   09/04/19 1600  TempSrc: Core (Comment)  PainSc:                  Analucia Hush

## 2019-09-04 NOTE — Anesthesia Procedure Notes (Addendum)
Procedure Name: Intubation Date/Time: 09/04/2019 7:40 AM Performed by: Harden Mo, CRNA Pre-anesthesia Checklist: Patient identified, Emergency Drugs available, Suction available and Patient being monitored Patient Re-evaluated:Patient Re-evaluated prior to induction Oxygen Delivery Method: Circle System Utilized Preoxygenation: Pre-oxygenation with 100% oxygen Induction Type: IV induction Ventilation: Mask ventilation without difficulty and Oral airway inserted - appropriate to patient size Laryngoscope Size: Sabra Heck and 2 Grade View: Grade I Tube type: Oral Tube size: 8.0 mm Number of attempts: 1 Airway Equipment and Method: Stylet and Oral airway Placement Confirmation: ETT inserted through vocal cords under direct vision,  positive ETCO2 and breath sounds checked- equal and bilateral Secured at: 22 cm Tube secured with: Tape Dental Injury: Teeth and Oropharynx as per pre-operative assessment  Comments: SRNA Doretha Sou intubated

## 2019-09-04 NOTE — Op Note (Signed)
CARDIOVASCULAR SURGERY OPERATIVE NOTE  09/04/2019  Surgeon:  Gaye Pollack, MD  First Assistant: Nicholes Rough,  PA-C   Preoperative Diagnosis:  Severe multi-vessel coronary artery disease   Postoperative Diagnosis:  Same   Procedure:  1. Median Sternotomy 2. Extracorporeal circulation 3.   Coronary artery bypass grafting x 4   Left internal mammary artery graft to the LAD  SVG to diagonal  SVG to OM  SVG to PL (RCA) 4.   Endoscopic vein harvest from the right leg   Anesthesia:  General Endotracheal   Clinical History/Surgical Indication:  This 54 year old gentleman has left main and severe three-vessel coronary disease with progressive anginal symptoms.Catheterization showed an eccentric ostial 40 to 50% left main stenosis followed by 50 to 70% distal left main stenosis. There was 65% mid LAD stenosis within the region with heavy calcification. The first diagonal branch had an 80% ostial stenosis. The left circumflex had 70% stenosis between the first and second marginal branches. The right coronary artery was a large vessel with diffuse in-stent restenosis up to 90% with competitive flow in the PDA from collateralization from the left coronary system. Left ventricular ejection fraction was 50 to 60% with an LVEDP of 17 mmHg.  I agree that coronary bypass graft surgery is the best treatment to resolve his symptoms and prevent myocardial infarction. I discussed the operative procedure with the patient and his wife including alternatives, benefits and risks; including but not limited to bleeding, blood transfusion, infection, stroke, myocardial infarction, graft failure, heart block requiring a permanent pacemaker, organ dysfunction, and death. Nicko C Rio understands and agrees to proceed.   Preparation:  The patient was seen in the preoperative holding area and the correct patient,  correct operation were confirmed with the patient after reviewing the medical record and catheterization. The consent was signed by me. Preoperative antibiotics were given. A pulmonary arterial line and radial arterial line were placed by the anesthesia team. The patient was taken back to the operating room and positioned supine on the operating room table. After being placed under general endotracheal anesthesia by the anesthesia team a foley catheter was placed. The neck, chest, abdomen, and both legs were prepped with betadine soap and solution and draped in the usual sterile manner. A surgical time-out was taken and the correct patient and operative procedure were confirmed with the nursing and anesthesia staff.   Cardiopulmonary Bypass:  A median sternotomy was performed. The pericardium was opened in the midline. Right ventricular function appeared normal. The ascending aorta was of normal size and had no palpable plaque. There were no contraindications to aortic cannulation or cross-clamping. The patient was fully systemically heparinized and the ACT was maintained > 400 sec. The proximal aortic arch was cannulated with a 20 F aortic cannula for arterial inflow. Venous cannulation was performed via the right atrial appendage using a two-staged venous cannula. An antegrade cardioplegia/vent cannula was inserted into the mid-ascending aorta. Aortic occlusion was performed with a single cross-clamp. Systemic cooling to 32 degrees Centigrade and topical cooling of the heart with iced saline were used. Hyperkalemic antegrade cold blood cardioplegia was used to induce diastolic arrest and was then given at about 20 minute intervals throughout the period of arrest to maintain myocardial temperature at or below 10 degrees centigrade. A temperature probe was inserted into the interventricular septum and an insulating pad was placed in the pericardium.   Left internal mammary artery harvest:  The left side of  the sternum was retracted using  the Rultract retractor. The left internal mammary artery was harvested as a pedicle graft. All side branches were clipped. It was a medium-sized vessel of good quality with excellent blood flow. It was ligated distally and divided. It was sprayed with topical papaverine solution to prevent vasospasm.   Endoscopic vein harvest:  The right greater saphenous vein was harvested endoscopically through a 2 cm incision medial to the right knee. It was harvested from the upper thigh to below the knee. It was a medium-sized vein of good quality. The side branches were all ligated with 4-0 silk ties.    Coronary arteries:  The coronary arteries were examined.   LAD:  Large vessel with no distal disease. Diagonal was a medium caliber vessel with calcified proximal plaque but no distal disease.  LCX:  Large OM with no distal disease  RCA:  Diffusely diseased with calcific plaque. Small PDA. PL was medium caliber vessel with no distal disease and was the largest branch of the RCA.   Grafts:  1. LIMA to the LAD: 2.5 mm. It was sewn end to side using 8-0 prolene continuous suture. 2. SVG to diagonal:  1.75 mm. It was sewn end to side using 7-0 prolene continuous suture. 3. SVG to OM:  2.0 mm. It was sewn end to side using 7-0 prolene continuous suture. 4. SVG to PL (RCA):  1.6 mm. It was sewn end to side using 7-0 prolene continuous suture.  The proximal vein graft anastomoses were performed to the mid-ascending aorta using continuous 6-0 prolene suture. Graft markers were placed around the proximal anastomoses.   Completion:  The patient was rewarmed to 37 degrees Centigrade. The clamp was removed from the LIMA pedicle and there was rapid warming of the septum and return of ventricular fibrillation. The crossclamp was removed with a time of 76 minutes. There was spontaneous return of sinus rhythm. The distal and proximal anastomoses were checked for hemostasis. The  position of the grafts was satisfactory. Two temporary epicardial pacing wires were placed on the right atrium and two on the right ventricle. The patient was weaned from CPB without difficulty on no inotropes. CPB time was 95 minutes. Cardiac output was 5 LPM. Heparin was fully reversed with protamine and the aortic and venous cannulas removed. Hemostasis was achieved. Mediastinal and left pleural drainage tubes were placed. The sternum was closed with single and double #6 stainless steel wires. The fascia was closed with continuous # 1 vicryl suture. The subcutaneous tissue was closed with 2-0 vicryl continuous suture. The skin was closed with 3-0 vicryl subcuticular suture. All sponge, needle, and instrument counts were reported correct at the end of the case. Dry sterile dressings were placed over the incisions and around the chest tubes which were connected to pleurevac suction. The patient was then transported to the surgical intensive care unit in stable condition.

## 2019-09-04 NOTE — Brief Op Note (Signed)
09/04/2019  12:35 PM  PATIENT:  Daniel Buck  54 y.o. male  PRE-OPERATIVE DIAGNOSIS:  CORONARY ARTERY DISEASE  POST-OPERATIVE DIAGNOSIS:  CORONARY ARTERY DISEASE  PROCEDURE:  Procedure(s) with comments: CORONARY ARTERY BYPASS GRAFTING (CABG) using LIMA to LAD; Endoscopic Vein Harvest of Right Greater Saphenous: SVG to Diag1; SVG to OM1; SVG to PL. (N/A) TRANSESOPHAGEAL ECHOCARDIOGRAM (TEE) (N/A) ENDOVEIN HARVEST OF GREATER SAPHENOUS VEIN (Right) - Endovein scope harvest of right upper and lower LE.   LIMA to LAD SVG to Diag1 SVG to OM1 SVG to PL  SURGEON:  Surgeon(s) and Role:    * Bartle, Fernande Boyden, MD - Primary  PHYSICIAN ASSISTANT:  Nicholes Rough, PA-C   ANESTHESIA:   general  EBL:  850 mL   BLOOD ADMINISTERED:none  DRAINS:  ROUTINE     LOCAL MEDICATIONS USED:  NONE  SPECIMEN:  No Specimen  DISPOSITION OF SPECIMEN:  N/A  COUNTS:  YES  DICTATION: .Dragon Dictation  PLAN OF CARE: Admit to inpatient   PATIENT DISPOSITION:  ICU - intubated and hemodynamically stable.   Delay start of Pharmacological VTE agent (>24hrs) due to surgical blood loss or risk of bleeding: yes

## 2019-09-04 NOTE — Procedures (Signed)
Extubation Procedure Note  Patient Details:   Name: Daniel Buck DOB: January 11, 1966 MRN: [id number]   Airway Documentation:    Vent end date: 09/04/19 Vent end time: 1630   Evaluation  O2 sats: stable throughout Complications: No apparent complications Patient did tolerate procedure well. Bilateral Breath Sounds: Clear, Diminished   Yes   Pt extubated per rapid wean protocol to 4L nasal cannula with humidity. NIF -30 and VC 988m. Pt suctioned via ETT and orally prior. Upon extubation pt with strong cough, able to speak name and no stridor heard at this time. Incentive spirometry given. RT will continue to monitor.   MSharla Kidney6/12/2019, 4:34 PM

## 2019-09-04 NOTE — Progress Notes (Signed)
Patient ID: Daniel Buck, male   DOB: Jun 19, 1965, 54 y.o.   MRN: [id number]  TCTS Evening Rounds:   Hemodynamically stable  CI = 2.0  Extubated and alert  Urine output good  CT output low  CBC    Component Value Date/Time   WBC 14.2 (H) 09/04/2019 1312   RBC 4.38 09/04/2019 1312   HGB 12.2 (L) 09/04/2019 1315   HGB 16.2 08/20/2019 1624   HCT 36.0 (L) 09/04/2019 1315   HCT 48.5 08/20/2019 1624   PLT 139 (L) 09/04/2019 1312   PLT 254 08/20/2019 1624   MCV 86.5 09/04/2019 1312   MCV 88 08/20/2019 1624   MCH 29.0 09/04/2019 1312   MCHC 33.5 09/04/2019 1312   RDW 12.5 09/04/2019 1312   RDW 13.1 08/20/2019 1624   LYMPHSABS 2,465 09/29/2016 1259   MONOABS 595 09/29/2016 1259   EOSABS 425 09/29/2016 1259   BASOSABS 85 09/29/2016 1259     BMET    Component Value Date/Time   NA 143 09/04/2019 1315   NA 145 (H) 08/20/2019 1624   K 3.8 09/04/2019 1315   CL 104 09/04/2019 1152   CO2 22 09/02/2019 0838   GLUCOSE 108 (H) 09/04/2019 1152   BUN 17 09/04/2019 1152   BUN 17 08/20/2019 1624   CREATININE 1.20 09/04/2019 1152   CREATININE 1.08 09/29/2016 1259   CALCIUM 9.7 09/02/2019 0838   GFRNONAA >60 09/02/2019 0838   GFRAA >60 09/02/2019 0838     A/P:  Stable postop course. Continue current plans

## 2019-09-05 ENCOUNTER — Inpatient Hospital Stay (HOSPITAL_COMMUNITY): Payer: 59

## 2019-09-05 ENCOUNTER — Encounter (HOSPITAL_COMMUNITY): Payer: Self-pay | Admitting: Surgery

## 2019-09-05 LAB — BASIC METABOLIC PANEL
Anion gap: 7 (ref 5–15)
Anion gap: 9 (ref 5–15)
BUN: 16 mg/dL (ref 6–20)
BUN: 23 mg/dL — ABNORMAL HIGH (ref 6–20)
CO2: 22 mmol/L (ref 22–32)
CO2: 24 mmol/L (ref 22–32)
Calcium: 8.7 mg/dL — ABNORMAL LOW (ref 8.9–10.3)
Calcium: 9.2 mg/dL (ref 8.9–10.3)
Chloride: 107 mmol/L (ref 98–111)
Chloride: 103 mmol/L (ref 98–111)
Creatinine, Ser: 1.23 mg/dL (ref 0.61–1.24)
Creatinine, Ser: 1.79 mg/dL — ABNORMAL HIGH (ref 0.61–1.24)
GFR calc Af Amer: >60 mL/min (ref >60)
GFR calc Af Amer: 49 mL/min — ABNORMAL LOW (ref >60)
GFR calc non Af Amer: >60 mL/min (ref >60)
GFR calc non Af Amer: 42 mL/min — ABNORMAL LOW (ref >60)
Glucose, Bld: 128 mg/dL — ABNORMAL HIGH (ref 70–99)
Glucose, Bld: 131 mg/dL — ABNORMAL HIGH (ref 70–99)
Potassium: 4.4 mmol/L (ref 3.5–5.1)
Potassium: 4.9 mmol/L (ref 3.5–5.1)
Sodium: 136 mmol/L (ref 135–145)
Sodium: 136 mmol/L (ref 135–145)

## 2019-09-05 LAB — CBC
HCT: 43.7 % (ref 39.0–52.0)
HCT: 45.3 % (ref 39.0–52.0)
Hemoglobin: 14.3 g/dL (ref 13.0–17.0)
Hemoglobin: 14.9 g/dL (ref 13.0–17.0)
MCH: 28.8 pg (ref 26.0–34.0)
MCH: 29.6 pg (ref 26.0–34.0)
MCHC: 32.7 g/dL (ref 30.0–36.0)
MCHC: 32.9 g/dL (ref 30.0–36.0)
MCV: 88.1 fL (ref 80.0–100.0)
MCV: 89.9 fL (ref 80.0–100.0)
Platelets: 180 10*3/uL (ref 150–400)
Platelets: 201 10*3/uL (ref 150–400)
RBC: 4.96 MIL/uL (ref 4.22–5.81)
RBC: 5.04 MIL/uL (ref 4.22–5.81)
RDW: 12.8 % (ref 11.5–15.5)
RDW: 13.1 % (ref 11.5–15.5)
WBC: 17.5 10*3/uL — ABNORMAL HIGH (ref 4.0–10.5)
WBC: 22.4 10*3/uL — ABNORMAL HIGH (ref 4.0–10.5)
nRBC: 0 % (ref 0.0–0.2)
nRBC: 0 % (ref 0.0–0.2)

## 2019-09-05 LAB — GLUCOSE, CAPILLARY
Glucose-Capillary: 114 mg/dL — ABNORMAL HIGH (ref 70–99)
Glucose-Capillary: 116 mg/dL — ABNORMAL HIGH (ref 70–99)
Glucose-Capillary: 127 mg/dL — ABNORMAL HIGH (ref 70–99)
Glucose-Capillary: 130 mg/dL — ABNORMAL HIGH (ref 70–99)
Glucose-Capillary: 141 mg/dL — ABNORMAL HIGH (ref 70–99)
Glucose-Capillary: 148 mg/dL — ABNORMAL HIGH (ref 70–99)

## 2019-09-05 LAB — MAGNESIUM
Magnesium: 2.2 mg/dL (ref 1.7–2.4)
Magnesium: 1.9 mg/dL (ref 1.7–2.4)

## 2019-09-05 MED ORDER — INSULIN ASPART 100 UNIT/ML ~~LOC~~ SOLN: 0.0000 [IU] | SUBCUTANEOUS | Status: DC

## 2019-09-05 MED ORDER — METOCLOPRAMIDE HCL 5 MG/ML IJ SOLN
10.0000 mg | Freq: Four times a day (QID) | INTRAMUSCULAR | Status: AC
  Administered 2019-09-05 – 2019-09-06 (×8): 10 mg via INTRAVENOUS
  Filled 2019-09-05 (×8): qty 2

## 2019-09-05 MED ORDER — ENOXAPARIN SODIUM 40 MG/0.4ML ~~LOC~~ SOLN
40.0000 mg | Freq: Every day | SUBCUTANEOUS | Status: DC
  Administered 2019-09-05 – 2019-09-08 (×4): 40 mg via SUBCUTANEOUS
  Filled 2019-09-05 (×4): qty 0.4

## 2019-09-05 MED ORDER — ALBUMIN HUMAN 5 % IV SOLN
12.5000 g | Freq: Once | INTRAVENOUS | Status: AC
  Administered 2019-09-05: 12.5 g via INTRAVENOUS
  Filled 2019-09-05: qty 250

## 2019-09-05 MED ORDER — FUROSEMIDE 10 MG/ML IJ SOLN
40.0000 mg | Freq: Two times a day (BID) | INTRAMUSCULAR | Status: AC
  Administered 2019-09-05 (×2): 40 mg via INTRAVENOUS
  Filled 2019-09-05 (×2): qty 4

## 2019-09-05 MED ORDER — FLUTICASONE PROPIONATE 50 MCG/ACT NA SUSP
1.0000 | Freq: Every day | NASAL | Status: DC
  Administered 2019-09-06: 1 via NASAL
  Filled 2019-09-05: qty 16

## 2019-09-05 MED ORDER — ROSUVASTATIN CALCIUM 20 MG PO TABS
40.0000 mg | ORAL_TABLET | Freq: Every day | ORAL | Status: DC
  Administered 2019-09-05 – 2019-09-09 (×5): 40 mg via ORAL
  Filled 2019-09-05 (×5): qty 2

## 2019-09-05 MED ORDER — LORAZEPAM 1 MG PO TABS
1.0000 mg | ORAL_TABLET | ORAL | Status: AC
  Administered 2019-09-05: 1 mg via ORAL
  Filled 2019-09-05: qty 1

## 2019-09-05 MED FILL — Thrombin (Recombinant) For Soln 20000 Unit: CUTANEOUS | Qty: 1 | Status: AC

## 2019-09-05 NOTE — Progress Notes (Signed)
[name] creatinine level and evening lab values discussed with MD Roxan Hockey.  RN and MD Roxan Hockey discussed patient's scheduled lasix administration.  MD Roxan Hockey advised to give patient an albumin and then to proceed with administration of Lasix.

## 2019-09-05 NOTE — Progress Notes (Signed)
1 Day Post-Op Procedure(s) (LRB): CORONARY ARTERY BYPASS GRAFTING (CABG) using LIMA to LAD; Endoscopic Vein Harvest of Right Greater Saphenous: SVG to Diag1; SVG to OM1; SVG to PL. (N/A) TRANSESOPHAGEAL ECHOCARDIOGRAM (TEE) (N/A) ENDOVEIN HARVEST OF GREATER SAPHENOUS VEIN (Right) Subjective: Has had nausea and pain overnight  Objective: Vital signs in last 24 hours: Temp:  [96.6 F (35.9 C)-98.6 F (37 C)] 97.7 F (36.5 C) (06/11 0700) Pulse Rate:  [75-176] 80 (06/11 0700) Cardiac Rhythm: Normal sinus rhythm (06/11 0400) Resp:  [7-32] 21 (06/11 0700) BP: (102-143)/(60-94) 128/87 (06/11 0700) SpO2:  [86 %-100 %] 92 % (06/11 0700) Arterial Line BP: (90-166)/(50-76) 130/72 (06/11 0700) FiO2 (%):  [40 %-50 %] 40 % (06/10 1550) Weight:  [94.7 kg] 94.7 kg (06/11 0206)  Hemodynamic parameters for last 24 hours: PAP: (17-46)/(6-28) 35/18 CO:  [4 L/min-7.3 L/min] 7.3 L/min CI:  [1.9 L/min/m2-3.5 L/min/m2] 3.5 L/min/m2  Intake/Output from previous day: 06/10 0701 - 06/11 0700 In: 4315.4 [P.O.:420; I.V.:2999; Blood:654; IV Piggyback:2596.7] Out: 0086 [Urine:3030; Blood:850; Chest Tube:800] Intake/Output this shift: No intake/output data recorded.  General appearance: alert and cooperative Neurologic: intact Heart: regular rate and rhythm, S1, S2 normal, no murmur Lungs: clear to auscultation bilaterally Extremities: edema mild Wound: dressings dry  Lab Results: Recent Labs    09/04/19 1858 09/05/19 0358  WBC 17.8* 17.5*  HGB 14.1 14.3  HCT 42.9 43.7  PLT 179 180   BMET:  Recent Labs    09/04/19 1858 09/05/19 0358  NA 140 136  K 4.7 4.4  CL 111 107  CO2 21* 22  GLUCOSE 120* 128*  BUN 16 16  CREATININE 1.27* 1.23  CALCIUM 8.6* 8.7*    PT/INR:  Recent Labs    09/04/19 1312  LABPROT 16.5*  INR 1.4*   ABG    Component Value Date/Time   PHART 7.316 (L) 09/04/2019 1731   HCO3 23.1 09/04/2019 1731   TCO2 25 09/04/2019 1731   ACIDBASEDEF 3.0 (H) 09/04/2019  1731   O2SAT 98.0 09/04/2019 1731   CBG (last 3)  Recent Labs    09/04/19 2331 09/05/19 0405 09/05/19 0629  GLUCAP 114* 116* 130*   CXR: low lung volumes. Gastric dilitation  ECG: NSR, RBBB  Assessment/Plan: S/P Procedure(s) (LRB): CORONARY ARTERY BYPASS GRAFTING (CABG) using LIMA to LAD; Endoscopic Vein Harvest of Right Greater Saphenous: SVG to Diag1; SVG to OM1; SVG to PL. (N/A) TRANSESOPHAGEAL ECHOCARDIOGRAM (TEE) (N/A) ENDOVEIN HARVEST OF GREATER SAPHENOUS VEIN (Right)  POD 1  Hemodynamically stable. Continue Lopressor.  DC  Chest tubes, swan, arterial line.  Diurese  Glucose under good control. No hx of DM with preop Hgb A1c of 6.1. Will need continued attention to diet and outpt follow up.  IS, OOB, ambulation  Add Reglan for nausea.  Pain should improve with chest tube removal. No Toradol with chronically mildly elevated creat of 1.2-1.4.   LOS: 1 day    Daniel Buck 09/05/2019

## 2019-09-05 NOTE — Progress Notes (Signed)
      [address]       [place] [address]             [phone]      POD # 1 CABG  Sleeping currently  BP (!) 149/90   Pulse 73   Temp 98.8 F (37.1 C) (Bladder)   Resp 18   Ht 5' 11"  (1.803 m)   Wt 94.7 kg   SpO2 93%   BMI 29.12 kg/m   Intake/Output Summary (Last 24 hours) at 09/05/2019 1743 Last data filed at 09/05/2019 1719 Gross per 24 hour  Intake 4075.91 ml  Output 2270 ml  Net 1805.91 ml   Creatinine 1.79 up from 1.23 WBC up to 22K Suspect SIRS  Remo Lipps C. Roxan Hockey, MD [facility] [phone]

## 2019-09-06 ENCOUNTER — Inpatient Hospital Stay (HOSPITAL_COMMUNITY): Payer: 59

## 2019-09-06 LAB — CBC
HCT: 39.2 % (ref 39.0–52.0)
Hemoglobin: 12.8 g/dL — ABNORMAL LOW (ref 13.0–17.0)
MCH: 28.8 pg (ref 26.0–34.0)
MCHC: 32.7 g/dL (ref 30.0–36.0)
MCV: 88.1 fL (ref 80.0–100.0)
Platelets: 162 10*3/uL (ref 150–400)
RBC: 4.45 MIL/uL (ref 4.22–5.81)
RDW: 13 % (ref 11.5–15.5)
WBC: 15.7 10*3/uL — ABNORMAL HIGH (ref 4.0–10.5)
nRBC: 0 % (ref 0.0–0.2)

## 2019-09-06 LAB — BASIC METABOLIC PANEL
Anion gap: 11 (ref 5–15)
BUN: 23 mg/dL — ABNORMAL HIGH (ref 6–20)
CO2: 25 mmol/L (ref 22–32)
Calcium: 9 mg/dL (ref 8.9–10.3)
Chloride: 97 mmol/L — ABNORMAL LOW (ref 98–111)
Creatinine, Ser: 1.3 mg/dL — ABNORMAL HIGH (ref 0.61–1.24)
GFR calc Af Amer: >60 mL/min (ref >60)
GFR calc non Af Amer: >60 mL/min (ref >60)
Glucose, Bld: 143 mg/dL — ABNORMAL HIGH (ref 70–99)
Potassium: 4 mmol/L (ref 3.5–5.1)
Sodium: 133 mmol/L — ABNORMAL LOW (ref 135–145)

## 2019-09-06 LAB — GLUCOSE, CAPILLARY
Glucose-Capillary: 129 mg/dL — ABNORMAL HIGH (ref 70–99)
Glucose-Capillary: 153 mg/dL — ABNORMAL HIGH (ref 70–99)
Glucose-Capillary: 115 mg/dL — ABNORMAL HIGH (ref 70–99)
Glucose-Capillary: 118 mg/dL — ABNORMAL HIGH (ref 70–99)
Glucose-Capillary: 110 mg/dL — ABNORMAL HIGH (ref 70–99)

## 2019-09-06 MED ORDER — FUROSEMIDE 10 MG/ML IJ SOLN
40.0000 mg | Freq: Two times a day (BID) | INTRAMUSCULAR | Status: AC
  Administered 2019-09-06 (×2): 40 mg via INTRAVENOUS
  Filled 2019-09-06 (×2): qty 4

## 2019-09-06 MED ORDER — MESALAMINE 1.2 G PO TBEC
1.2000 g | DELAYED_RELEASE_TABLET | Freq: Four times a day (QID) | ORAL | Status: DC
  Administered 2019-09-06 – 2019-09-08 (×8): 1.2 g via ORAL
  Filled 2019-09-06 (×15): qty 1

## 2019-09-06 MED ORDER — SODIUM CHLORIDE 0.9% FLUSH
10.0000 mL | Freq: Two times a day (BID) | INTRAVENOUS | Status: DC
  Administered 2019-09-06: 10 mL
  Administered 2019-09-07: 20 mL

## 2019-09-06 MED ORDER — SODIUM CHLORIDE 0.9% FLUSH: 10.0000 mL | INTRAVENOUS | Status: DC | PRN

## 2019-09-06 MED ORDER — INSULIN ASPART 100 UNIT/ML ~~LOC~~ SOLN
0.0000 [IU] | Freq: Three times a day (TID) | SUBCUTANEOUS | Status: DC
  Administered 2019-09-08: 2 [IU] via SUBCUTANEOUS

## 2019-09-06 MED ORDER — LORAZEPAM 0.5 MG PO TABS
0.5000 mg | ORAL_TABLET | Freq: Four times a day (QID) | ORAL | Status: DC | PRN
  Administered 2019-09-06 – 2019-09-07 (×2): 0.5 mg via ORAL
  Filled 2019-09-06 (×2): qty 1

## 2019-09-06 MED ORDER — PROMETHAZINE HCL 25 MG/ML IJ SOLN
12.5000 mg | Freq: Four times a day (QID) | INTRAMUSCULAR | Status: DC | PRN
  Administered 2019-09-06: 12.5 mg via INTRAVENOUS
  Filled 2019-09-06: qty 1

## 2019-09-06 MED ORDER — CLOPIDOGREL BISULFATE 75 MG PO TABS
75.0000 mg | ORAL_TABLET | Freq: Every day | ORAL | Status: DC
  Administered 2019-09-06 – 2019-09-09 (×4): 75 mg via ORAL
  Filled 2019-09-06 (×4): qty 1

## 2019-09-06 NOTE — Progress Notes (Signed)
° °   °  [address]       [place] [address]             [phone]      C/o pain and nausea  BP 125/77    Pulse 95    Temp 98.9 F (37.2 C) (Oral)    Resp (!) 31    Ht 5' 11"  (1.803 m)    Wt 93.3 kg    SpO2 91%    BMI 28.69 kg/m   Intake/Output Summary (Last 24 hours) at 09/06/2019 1810 Last data filed at 09/06/2019 1346 Gross per 24 hour  Intake 1165.05 ml  Output 4920 ml  Net -3754.95 ml   Diuresed well  Will address pain, nausea, anxiety  Kayne Yuhas C. Roxan Hockey, MD [facility] [phone]

## 2019-09-06 NOTE — Plan of Care (Signed)
  Problem: Activity: Goal: Risk for activity intolerance will decrease Outcome: Progressing   Problem: Cardiac: Goal: Will achieve and/or maintain hemodynamic stability Outcome: Progressing   Problem: Clinical Measurements: Goal: Postoperative complications will be avoided or minimized Outcome: Progressing   Problem: Respiratory: Goal: Respiratory status will improve Outcome: Progressing   Problem: Skin Integrity: Goal: Wound healing without signs and symptoms of infection Outcome: Progressing Goal: Risk for impaired skin integrity will decrease Outcome: Progressing   Problem: Clinical Measurements: Goal: Ability to maintain clinical measurements within normal limits will improve Outcome: Progressing Goal: Will remain free from infection Outcome: Progressing Goal: Diagnostic test results will improve Outcome: Progressing Goal: Respiratory complications will improve Outcome: Progressing Goal: Cardiovascular complication will be avoided Outcome: Progressing   Problem: Activity: Goal: Risk for activity intolerance will decrease Outcome: Progressing   Problem: Nutrition: Goal: Adequate nutrition will be maintained Outcome: Progressing   Problem: Coping: Goal: Level of anxiety will decrease Outcome: Progressing   Problem: Elimination: Goal: Will not experience complications related to bowel motility Outcome: Progressing   Problem: Pain Managment: Goal: General experience of comfort will improve Outcome: Progressing   Problem: Safety: Goal: Ability to remain free from injury will improve Outcome: Progressing   Problem: Skin Integrity: Goal: Risk for impaired skin integrity will decrease Outcome: Progressing

## 2019-09-06 NOTE — Plan of Care (Signed)
°  Problem: Education: Goal: Will demonstrate proper wound care and an understanding of methods to prevent future damage Outcome: Progressing Goal: Knowledge of disease or condition will improve Outcome: Progressing Goal: Knowledge of the prescribed therapeutic regimen will improve Outcome: Progressing Goal: Individualized Educational Video(s) Outcome: Progressing   Problem: Activity: Goal: Risk for activity intolerance will decrease Outcome: Progressing   Problem: Cardiac: Goal: Will achieve and/or maintain hemodynamic stability Outcome: Progressing   Problem: Clinical Measurements: Goal: Postoperative complications will be avoided or minimized Outcome: Progressing   Problem: Respiratory: Goal: Respiratory status will improve Outcome: Progressing   Problem: Skin Integrity: Goal: Wound healing without signs and symptoms of infection Outcome: Progressing Goal: Risk for impaired skin integrity will decrease Outcome: Progressing   Problem: Urinary Elimination: Goal: Ability to achieve and maintain adequate renal perfusion and functioning will improve Outcome: Progressing   Problem: Education: Goal: Knowledge of General Education information will improve Description: Including pain rating scale, medication(s)/side effects and non-pharmacologic comfort measures Outcome: Progressing   Problem: Health Behavior/Discharge Planning: Goal: Ability to manage health-related needs will improve Outcome: Progressing   Problem: Clinical Measurements: Goal: Ability to maintain clinical measurements within normal limits will improve Outcome: Progressing Goal: Will remain free from infection Outcome: Progressing Goal: Diagnostic test results will improve Outcome: Progressing Goal: Respiratory complications will improve Outcome: Progressing Goal: Cardiovascular complication will be avoided Outcome: Progressing   Problem: Activity: Goal: Risk for activity intolerance will  decrease Outcome: Progressing   Problem: Nutrition: Goal: Adequate nutrition will be maintained Outcome: Progressing   Problem: Coping: Goal: Level of anxiety will decrease Outcome: Progressing   Problem: Elimination: Goal: Will not experience complications related to bowel motility Outcome: Progressing Goal: Will not experience complications related to urinary retention Outcome: Progressing   Problem: Pain Managment: Goal: General experience of comfort will improve Outcome: Progressing   Problem: Safety: Goal: Ability to remain free from injury will improve Outcome: Progressing   Problem: Skin Integrity: Goal: Risk for impaired skin integrity will decrease Outcome: Progressing

## 2019-09-06 NOTE — Progress Notes (Signed)
2 Days Post-Op Procedure(s) (LRB): CORONARY ARTERY BYPASS GRAFTING (CABG) using LIMA to LAD; Endoscopic Vein Harvest of Right Greater Saphenous: SVG to Diag1; SVG to OM1; SVG to PL. (N/A) TRANSESOPHAGEAL ECHOCARDIOGRAM (TEE) (N/A) ENDOVEIN HARVEST OF GREATER SAPHENOUS VEIN (Right) Subjective: C/o nausea. Anxiety improved  Objective: Vital signs in last 24 hours: Temp:  [97.7 F (36.5 C)-99.3 F (37.4 C)] 98.5 F (36.9 C) (06/12 0756) Pulse Rate:  [73-96] 81 (06/12 0630) Cardiac Rhythm: Normal sinus rhythm (06/12 0400) Resp:  [15-32] 29 (06/12 0630) BP: (118-158)/(57-108) 144/79 (06/12 0600) SpO2:  [89 %-95 %] 93 % (06/12 0630) Weight:  [93.3 kg] 93.3 kg (06/12 0500)  Hemodynamic parameters for last 24 hours:    Intake/Output from previous day: 06/11 0701 - 06/12 0700 In: 2860.1 [P.O.:2040; I.V.:406.3; IV Piggyback:403.8] Out: 2670 [Urine:2630; Chest Tube:40] Intake/Output this shift: No intake/output data recorded.  General appearance: alert, cooperative and no distress Neurologic: intact Heart: regular rate and rhythm Lungs: diminished breath sounds left base Abdomen: normal findings: soft, non-tender  Lab Results: Recent Labs    09/05/19 1623 09/06/19 0527  WBC 22.4* 15.7*  HGB 14.9 12.8*  HCT 45.3 39.2  PLT 201 162   BMET:  Recent Labs    09/05/19 1623 09/06/19 0527  NA 136 133*  K 4.9 4.0  CL 103 97*  CO2 24 25  GLUCOSE 131* 143*  BUN 23* 23*  CREATININE 1.79* 1.30*  CALCIUM 9.2 9.0    PT/INR:  Recent Labs    09/04/19 1312  LABPROT 16.5*  INR 1.4*   ABG    Component Value Date/Time   PHART 7.316 (L) 09/04/2019 1731   HCO3 23.1 09/04/2019 1731   TCO2 25 09/04/2019 1731   ACIDBASEDEF 3.0 (H) 09/04/2019 1731   O2SAT 98.0 09/04/2019 1731   CBG (last 3)  Recent Labs    09/05/19 2357 09/06/19 0351 09/06/19 0752  GLUCAP 127* 129* 153*    Assessment/Plan: S/P Procedure(s) (LRB): CORONARY ARTERY BYPASS GRAFTING (CABG) using LIMA to  LAD; Endoscopic Vein Harvest of Right Greater Saphenous: SVG to Diag1; SVG to OM1; SVG to PL. (N/A) TRANSESOPHAGEAL ECHOCARDIOGRAM (TEE) (N/A) ENDOVEIN HARVEST OF GREATER SAPHENOUS VEIN (Right) -  CV- stable in SR RESP- still on HF [place] at 10 L  Continue diuresis  Left lower lobe atelectasis +/- effusion- IS, flutter RENAL- creatinine up to 1.79 last night now back down to 1.30  [employer] [place] today ENDO- CBG well controlled, change to AC/HS LEUKOCYTOSIS- WBC down from 22K to 15K Cardiac rehab   LOS: 2 days    Daniel Buck 09/06/2019

## 2019-09-06 NOTE — Progress Notes (Signed)
RT instructed patient and family on the use of a flutter valve. Patient was able to demonstrate back good technique.

## 2019-09-07 ENCOUNTER — Inpatient Hospital Stay (HOSPITAL_COMMUNITY): Payer: 59

## 2019-09-07 LAB — BASIC METABOLIC PANEL
Anion gap: 11 (ref 5–15)
BUN: 20 mg/dL (ref 6–20)
CO2: 31 mmol/L (ref 22–32)
Calcium: 9.1 mg/dL (ref 8.9–10.3)
Chloride: 96 mmol/L — ABNORMAL LOW (ref 98–111)
Creatinine, Ser: 1.13 mg/dL (ref 0.61–1.24)
GFR calc Af Amer: >60 mL/min (ref >60)
GFR calc non Af Amer: >60 mL/min (ref >60)
Glucose, Bld: 110 mg/dL — ABNORMAL HIGH (ref 70–99)
Potassium: 3.2 mmol/L — ABNORMAL LOW (ref 3.5–5.1)
Sodium: 138 mmol/L (ref 135–145)

## 2019-09-07 LAB — GLUCOSE, CAPILLARY
Glucose-Capillary: 115 mg/dL — ABNORMAL HIGH (ref 70–99)
Glucose-Capillary: 107 mg/dL — ABNORMAL HIGH (ref 70–99)
Glucose-Capillary: 107 mg/dL — ABNORMAL HIGH (ref 70–99)
Glucose-Capillary: 98 mg/dL (ref 70–99)

## 2019-09-07 LAB — CBC
HCT: 37.9 % — ABNORMAL LOW (ref 39.0–52.0)
Hemoglobin: 12.6 g/dL — ABNORMAL LOW (ref 13.0–17.0)
MCH: 28.6 pg (ref 26.0–34.0)
MCHC: 33.2 g/dL (ref 30.0–36.0)
MCV: 85.9 fL (ref 80.0–100.0)
Platelets: 155 10*3/uL (ref 150–400)
RBC: 4.41 MIL/uL (ref 4.22–5.81)
RDW: 12.7 % (ref 11.5–15.5)
WBC: 12.9 10*3/uL — ABNORMAL HIGH (ref 4.0–10.5)
nRBC: 0 % (ref 0.0–0.2)

## 2019-09-07 MED ORDER — CHLORHEXIDINE GLUCONATE CLOTH 2 % EX PADS
6.0000 | MEDICATED_PAD | Freq: Every day | CUTANEOUS | Status: DC
  Administered 2019-09-08: 6 via TOPICAL

## 2019-09-07 MED ORDER — METOPROLOL TARTRATE 25 MG PO TABS
25.0000 mg | ORAL_TABLET | Freq: Two times a day (BID) | ORAL | Status: DC
  Administered 2019-09-07 (×2): 25 mg via ORAL
  Filled 2019-09-07 (×3): qty 1

## 2019-09-07 MED ORDER — AMLODIPINE BESYLATE 5 MG PO TABS
5.0000 mg | ORAL_TABLET | Freq: Every day | ORAL | Status: DC
  Administered 2019-09-07 – 2019-09-09 (×3): 5 mg via ORAL
  Filled 2019-09-07 (×3): qty 1

## 2019-09-07 MED ORDER — METOPROLOL TARTRATE 25 MG/10 ML ORAL SUSPENSION: 25.0000 mg | Freq: Two times a day (BID) | ORAL | Status: DC

## 2019-09-07 MED ORDER — POTASSIUM CHLORIDE 10 MEQ/50ML IV SOLN
10.0000 meq | INTRAVENOUS | Status: AC
  Administered 2019-09-07 (×3): 10 meq via INTRAVENOUS
  Filled 2019-09-07 (×2): qty 50

## 2019-09-07 MED ORDER — POTASSIUM CHLORIDE CRYS ER 20 MEQ PO TBCR
20.0000 meq | EXTENDED_RELEASE_TABLET | ORAL | Status: AC
  Administered 2019-09-07 (×3): 20 meq via ORAL
  Filled 2019-09-07 (×3): qty 1

## 2019-09-07 MED ORDER — CHLORHEXIDINE GLUCONATE CLOTH 2 % EX PADS
6.0000 | MEDICATED_PAD | Freq: Every day | CUTANEOUS | Status: DC
  Administered 2019-09-07: 6 via TOPICAL

## 2019-09-07 NOTE — Progress Notes (Signed)
      [address]       [place] [address]             [phone]      Feels better this evening  BP 125/80   Pulse 92   Temp 99 F (37.2 C) (Oral)   Resp (!) 26   Ht 5' 11"  (1.803 m)   Wt 91.2 kg   SpO2 98%   BMI 28.04 kg/m  Down to 1L [place]   Intake/Output Summary (Last 24 hours) at 09/07/2019 1950 Last data filed at 09/07/2019 1900 Gross per 24 hour  Intake 557.71 ml  Output 3351 ml  Net -2793.29 ml    Doing well Should be able to transfer to 4E in AM  Remo Lipps C. Roxan Hockey, MD [facility] [phone]

## 2019-09-07 NOTE — Progress Notes (Signed)
3 Days Post-Op Procedure(s) (LRB): CORONARY ARTERY BYPASS GRAFTING (CABG) using LIMA to LAD; Endoscopic Vein Harvest of Right Greater Saphenous: SVG to Diag1; SVG to OM1; SVG to PL. (N/A) TRANSESOPHAGEAL ECHOCARDIOGRAM (TEE) (N/A) ENDOVEIN HARVEST OF GREATER SAPHENOUS VEIN (Right) Subjective: Feels better today, got some sleep last night, but still having nausea  Objective: Vital signs in last 24 hours: Temp:  [98.1 F (36.7 C)-98.9 F (37.2 C)] 98.3 F (36.8 C) (06/13 0700) Pulse Rate:  [76-109] 99 (06/13 0900) Cardiac Rhythm: Normal sinus rhythm;Sinus tachycardia (06/12 2000) Resp:  [16-31] 20 (06/13 0900) BP: (118-176)/(72-106) 143/88 (06/13 0900) SpO2:  [89 %-98 %] 96 % (06/13 0900) Weight:  [91.2 kg] 91.2 kg (06/13 0645)  Hemodynamic parameters for last 24 hours:    Intake/Output from previous day: 06/12 0701 - 06/13 0700 In: 479.4 [P.O.:360; I.V.:109.4] Out: 5675 [Urine:5675] Intake/Output this shift: No intake/output data recorded.  General appearance: alert, cooperative and no distress Neurologic: intact Heart: regular rate and rhythm Lungs: diminished breath sounds bibasilar Abdomen: normal findings: soft, non-tender  Lab Results: Recent Labs    09/06/19 0527 09/07/19 0438  WBC 15.7* 12.9*  HGB 12.8* 12.6*  HCT 39.2 37.9*  PLT 162 155   BMET:  Recent Labs    09/06/19 0527 09/07/19 0438  NA 133* 138  K 4.0 3.2*  CL 97* 96*  CO2 25 31  GLUCOSE 143* 110*  BUN 23* 20  CREATININE 1.30* 1.13  CALCIUM 9.0 9.1    PT/INR:  Recent Labs    09/04/19 1312  LABPROT 16.5*  INR 1.4*   ABG    Component Value Date/Time   PHART 7.316 (L) 09/04/2019 1731   HCO3 23.1 09/04/2019 1731   TCO2 25 09/04/2019 1731   ACIDBASEDEF 3.0 (H) 09/04/2019 1731   O2SAT 98.0 09/04/2019 1731   CBG (last 3)  Recent Labs    09/06/19 1503 09/06/19 2154 09/07/19 0625  GLUCAP 118* 110* 115*    Assessment/Plan: S/P Procedure(s) (LRB): CORONARY ARTERY BYPASS GRAFTING  (CABG) using LIMA to LAD; Endoscopic Vein Harvest of Right Greater Saphenous: SVG to Diag1; SVG to OM1; SVG to PL. (N/A) TRANSESOPHAGEAL ECHOCARDIOGRAM (TEE) (N/A) ENDOVEIN HARVEST OF GREATER SAPHENOUS VEIN (Right) -CV- In Sr, BP mildly elevated- increase metoprolol, restart Norvasc  Statin, ASA RESP- left lower lobe atelectasis +/- effusion  Continue IS, flutter RENAL- diuresed 5L yesterday  Hold off on additional diuresis for now  Hypokalemia- supplement K ENDO- CBG well controlled Cardiac rehab Dc central line and Foley   LOS: 3 days    Melrose Nakayama 09/07/2019

## 2019-09-08 ENCOUNTER — Inpatient Hospital Stay (HOSPITAL_COMMUNITY): Payer: 59

## 2019-09-08 DIAGNOSIS — I252 Old myocardial infarction: Secondary | ICD-10-CM | POA: Diagnosis not present

## 2019-09-08 DIAGNOSIS — I10 Essential (primary) hypertension: Secondary | ICD-10-CM | POA: Diagnosis not present

## 2019-09-08 DIAGNOSIS — J9811 Atelectasis: Secondary | ICD-10-CM | POA: Diagnosis not present

## 2019-09-08 DIAGNOSIS — Z20822 Contact with and (suspected) exposure to covid-19: Secondary | ICD-10-CM | POA: Diagnosis not present

## 2019-09-08 DIAGNOSIS — I25118 Atherosclerotic heart disease of native coronary artery with other forms of angina pectoris: Secondary | ICD-10-CM | POA: Diagnosis not present

## 2019-09-08 DIAGNOSIS — T82855A Stenosis of coronary artery stent, initial encounter: Secondary | ICD-10-CM | POA: Diagnosis not present

## 2019-09-08 DIAGNOSIS — N179 Acute kidney failure, unspecified: Secondary | ICD-10-CM | POA: Diagnosis not present

## 2019-09-08 DIAGNOSIS — J90 Pleural effusion, not elsewhere classified: Secondary | ICD-10-CM | POA: Diagnosis not present

## 2019-09-08 DIAGNOSIS — D62 Acute posthemorrhagic anemia: Secondary | ICD-10-CM | POA: Diagnosis not present

## 2019-09-08 LAB — CBC
HCT: 37.3 % — ABNORMAL LOW (ref 39.0–52.0)
Hemoglobin: 12.1 g/dL — ABNORMAL LOW (ref 13.0–17.0)
MCH: 28.5 pg (ref 26.0–34.0)
MCHC: 32.4 g/dL (ref 30.0–36.0)
MCV: 88 fL (ref 80.0–100.0)
Platelets: 202 10*3/uL (ref 150–400)
RBC: 4.24 MIL/uL (ref 4.22–5.81)
RDW: 13.2 % (ref 11.5–15.5)
WBC: 11.2 10*3/uL — ABNORMAL HIGH (ref 4.0–10.5)
nRBC: 0 % (ref 0.0–0.2)

## 2019-09-08 LAB — BASIC METABOLIC PANEL
Anion gap: 11 (ref 5–15)
BUN: 20 mg/dL (ref 6–20)
CO2: 28 mmol/L (ref 22–32)
Calcium: 9.2 mg/dL (ref 8.9–10.3)
Chloride: 101 mmol/L (ref 98–111)
Creatinine, Ser: 1.17 mg/dL (ref 0.61–1.24)
GFR calc Af Amer: >60 mL/min (ref >60)
GFR calc non Af Amer: >60 mL/min (ref >60)
Glucose, Bld: 108 mg/dL — ABNORMAL HIGH (ref 70–99)
Potassium: 3.5 mmol/L (ref 3.5–5.1)
Sodium: 140 mmol/L (ref 135–145)

## 2019-09-08 LAB — GLUCOSE, CAPILLARY
Glucose-Capillary: 142 mg/dL — ABNORMAL HIGH (ref 70–99)
Glucose-Capillary: 122 mg/dL — ABNORMAL HIGH (ref 70–99)

## 2019-09-08 MED ORDER — POTASSIUM CHLORIDE CRYS ER 20 MEQ PO TBCR
20.0000 meq | EXTENDED_RELEASE_TABLET | Freq: Every day | ORAL | Status: DC
  Administered 2019-09-09: 20 meq via ORAL
  Filled 2019-09-08: qty 1

## 2019-09-08 MED ORDER — SODIUM CHLORIDE 0.9 % IV SOLN: 250.0000 mL | INTRAVENOUS | Status: DC | PRN

## 2019-09-08 MED ORDER — POTASSIUM CHLORIDE CRYS ER 20 MEQ PO TBCR
20.0000 meq | EXTENDED_RELEASE_TABLET | ORAL | Status: AC
  Administered 2019-09-08 (×3): 20 meq via ORAL
  Filled 2019-09-08 (×3): qty 1

## 2019-09-08 MED ORDER — ASPIRIN EC 325 MG PO TBEC
325.0000 mg | DELAYED_RELEASE_TABLET | Freq: Every day | ORAL | Status: DC
  Administered 2019-09-08 – 2019-09-09 (×2): 325 mg via ORAL
  Filled 2019-09-08: qty 1

## 2019-09-08 MED ORDER — DOCUSATE SODIUM 100 MG PO CAPS
200.0000 mg | ORAL_CAPSULE | Freq: Every day | ORAL | Status: DC
  Filled 2019-09-08: qty 2

## 2019-09-08 MED ORDER — ~~LOC~~ CARDIAC SURGERY, PATIENT & FAMILY EDUCATION: Freq: Once | Status: AC

## 2019-09-08 MED ORDER — PANTOPRAZOLE SODIUM 40 MG PO TBEC
40.0000 mg | DELAYED_RELEASE_TABLET | Freq: Every day | ORAL | Status: DC
  Administered 2019-09-08 – 2019-09-09 (×2): 40 mg via ORAL
  Filled 2019-09-08: qty 1

## 2019-09-08 MED ORDER — ONDANSETRON HCL 4 MG PO TABS: 4.0000 mg | ORAL_TABLET | Freq: Four times a day (QID) | ORAL | Status: DC | PRN

## 2019-09-08 MED ORDER — ONDANSETRON HCL 4 MG/2ML IJ SOLN: 4.0000 mg | Freq: Four times a day (QID) | INTRAMUSCULAR | Status: DC | PRN

## 2019-09-08 MED ORDER — OXYCODONE HCL 5 MG PO TABS: 5.0000 mg | ORAL_TABLET | ORAL | Status: DC | PRN

## 2019-09-08 MED ORDER — SODIUM CHLORIDE 0.9% FLUSH
3.0000 mL | INTRAVENOUS | Status: DC | PRN
  Administered 2019-09-08: 3 mL via INTRAVENOUS

## 2019-09-08 MED ORDER — ACETAMINOPHEN 325 MG PO TABS
650.0000 mg | ORAL_TABLET | Freq: Four times a day (QID) | ORAL | Status: DC | PRN
  Administered 2019-09-08: 650 mg via ORAL
  Filled 2019-09-08: qty 2

## 2019-09-08 MED ORDER — SODIUM CHLORIDE 0.9% FLUSH
3.0000 mL | Freq: Two times a day (BID) | INTRAVENOUS | Status: DC
  Administered 2019-09-08 – 2019-09-09 (×3): 3 mL via INTRAVENOUS

## 2019-09-08 MED ORDER — FUROSEMIDE 40 MG PO TABS
40.0000 mg | ORAL_TABLET | Freq: Every day | ORAL | Status: DC
  Administered 2019-09-08 – 2019-09-09 (×2): 40 mg via ORAL
  Filled 2019-09-08 (×2): qty 1

## 2019-09-08 MED ORDER — METOPROLOL TARTRATE 25 MG PO TABS
25.0000 mg | ORAL_TABLET | Freq: Two times a day (BID) | ORAL | Status: DC
  Administered 2019-09-08 – 2019-09-09 (×3): 25 mg via ORAL
  Filled 2019-09-08 (×2): qty 1

## 2019-09-08 NOTE — Progress Notes (Signed)
CARDIAC REHAB PHASE I   PRE:  Rate/Rhythm: 91 SR    BP: sitting 104/71    SaO2: 92 RA  MODE:  Ambulation: 860 ft   POST:  Rate/Rhythm: 115 ST with PACs in room, 100 ST by end of walk    BP: sitting 141/83     SaO2: 98 RA  Min assist to get out of bed. Slightly unsteady in room initially but then able to walk hall without AD, steady. Better with distance. Sts he feels more SOB today but VSS. Return to bed, appropriate mechanics. Encouraged IS and more walking. Will f/u for education tomorrow. [place], ACSM 09/08/2019 1:48 PM

## 2019-09-08 NOTE — Plan of Care (Signed)
  Problem: Education: Goal: Will demonstrate proper wound care and an understanding of methods to prevent future damage Outcome: Progressing Goal: Knowledge of disease or condition will improve Outcome: Progressing Goal: Knowledge of the prescribed therapeutic regimen will improve Outcome: Progressing   Problem: Activity: Goal: Risk for activity intolerance will decrease Outcome: Progressing   Problem: Cardiac: Goal: Will achieve and/or maintain hemodynamic stability Outcome: Progressing   Problem: Clinical Measurements: Goal: Postoperative complications will be avoided or minimized Outcome: Progressing   Problem: Respiratory: Goal: Respiratory status will improve Outcome: Progressing   Problem: Skin Integrity: Goal: Wound healing without signs and symptoms of infection Outcome: Progressing Goal: Risk for impaired skin integrity will decrease Outcome: Progressing   Problem: Urinary Elimination: Goal: Ability to achieve and maintain adequate renal perfusion and functioning will improve Outcome: Completed/Met   Problem: Education: Goal: Knowledge of General Education information will improve Description: Including pain rating scale, medication(s)/side effects and non-pharmacologic comfort measures Outcome: Progressing   Problem: Health Behavior/Discharge Planning: Goal: Ability to manage health-related needs will improve Outcome: Progressing   Problem: Clinical Measurements: Goal: Ability to maintain clinical measurements within normal limits will improve Outcome: Progressing Goal: Will remain free from infection Outcome: Progressing Goal: Diagnostic test results will improve Outcome: Progressing Goal: Respiratory complications will improve Outcome: Progressing Goal: Cardiovascular complication will be avoided Outcome: Progressing   Problem: Activity: Goal: Risk for activity intolerance will decrease Outcome: Progressing   Problem: Nutrition: Goal: Adequate  nutrition will be maintained Outcome: Progressing   Problem: Coping: Goal: Level of anxiety will decrease Outcome: Progressing   Problem: Elimination: Goal: Will not experience complications related to bowel motility Outcome: Completed/Met Goal: Will not experience complications related to urinary retention Outcome: Completed/Met   Problem: Pain Managment: Goal: General experience of comfort will improve Outcome: Progressing   Problem: Safety: Goal: Ability to remain free from injury will improve Outcome: Progressing   Problem: Skin Integrity: Goal: Risk for impaired skin integrity will decrease Outcome: Progressing

## 2019-09-08 NOTE — Progress Notes (Signed)
Mobility Specialist - Progress Note   09/08/19 1759  Mobility  Activity Ambulated in hall  Level of Assistance Independent  Assistive Device None  Distance Ambulated (ft) 1200 ft  Mobility Response Tolerated well  Mobility performed by Mobility specialist  $Mobility charge 1 Mobility   Pre-mobility: 97 HR, 128/87 BP, 90% SpO2 During mobility: 103 HR, 97% SpO2 Post-mobility: 93 HR, 163/93 BP, 96% SpO2  Nurse notified of elevated BP.   [place] Specialist

## 2019-09-08 NOTE — Progress Notes (Signed)
4 Days Post-Op Procedure(s) (LRB): CORONARY ARTERY BYPASS GRAFTING (CABG) using LIMA to LAD; Endoscopic Vein Harvest of Right Greater Saphenous: SVG to Diag1; SVG to OM1; SVG to PL. (N/A) TRANSESOPHAGEAL ECHOCARDIOGRAM (TEE) (N/A) ENDOVEIN HARVEST OF GREATER SAPHENOUS VEIN (Right) Subjective: Feeling much better  Objective: Vital signs in last 24 hours: Temp:  [98.2 F (36.8 C)-99.6 F (37.6 C)] 98.3 F (36.8 C) (06/14 0400) Pulse Rate:  [87-108] 108 (06/14 0600) Cardiac Rhythm: Normal sinus rhythm (06/13 2000) Resp:  [14-37] 21 (06/14 0600) BP: (106-145)/(67-90) 106/84 (06/14 0600) SpO2:  [89 %-100 %] 91 % (06/14 0600) Weight:  [93.2 kg] 93.2 kg (06/14 0615)  Hemodynamic parameters for last 24 hours:    Intake/Output from previous day: 06/13 0701 - 06/14 0700 In: 838.4 [P.O.:630; I.V.:58.4; IV Piggyback:150] Out: 951 [Urine:950; Stool:1] Intake/Output this shift: No intake/output data recorded.  General appearance: alert and cooperative Neurologic: intact Heart: regular rate and rhythm, S1, S2 normal, no murmur, click, rub or gallop Lungs: diminished breath sounds LLL Extremities: extremities normal, atraumatic, no cyanosis or edema Wound: incision ok  Lab Results: Recent Labs    09/07/19 0438 09/08/19 0346  WBC 12.9* 11.2*  HGB 12.6* 12.1*  HCT 37.9* 37.3*  PLT 155 202   BMET:  Recent Labs    09/07/19 0438 09/08/19 0346  NA 138 140  K 3.2* 3.5  CL 96* 101  CO2 31 28  GLUCOSE 110* 108*  BUN 20 20  CREATININE 1.13 1.17  CALCIUM 9.1 9.2    PT/INR: No results for input(s): LABPROT, INR in the last 72 hours. ABG    Component Value Date/Time   PHART 7.316 (L) 09/04/2019 1731   HCO3 23.1 09/04/2019 1731   TCO2 25 09/04/2019 1731   ACIDBASEDEF 3.0 (H) 09/04/2019 1731   O2SAT 98.0 09/04/2019 1731   CBG (last 3)  Recent Labs    09/07/19 1512 09/07/19 2148 09/08/19 0634  GLUCAP 107* 98 142*   CXR: LLL atelectasis and small left effusion. Aeration  improved from yesterday.  Assessment/Plan: S/P Procedure(s) (LRB): CORONARY ARTERY BYPASS GRAFTING (CABG) using LIMA to LAD; Endoscopic Vein Harvest of Right Greater Saphenous: SVG to Diag1; SVG to OM1; SVG to PL. (N/A) TRANSESOPHAGEAL ECHOCARDIOGRAM (TEE) (N/A) ENDOVEIN HARVEST OF GREATER SAPHENOUS VEIN (Right)  POD 4 Hemodynamically stable on Lopressor.  Pain under good control with oxy.  LLL atelectasis: continue flutter valve and IS, ambulate. Will do 2V CXR in am.  Mild volume excess: wt is 5.5 lbs over preop if accurate. Continue diuretic.  Transfer to 4E. Home in a 1-2 days.   LOS: 4 days    Daniel Buck 09/08/2019

## 2019-09-08 NOTE — Plan of Care (Signed)
Continue to monitor

## 2019-09-08 NOTE — Progress Notes (Signed)
Patient arrived to 4E room 05 at this time. Telemetry applied and CCMD notified. V/s and assessment complete. Patient oriented to the room and how to call nurse with any needs.

## 2019-09-09 ENCOUNTER — Inpatient Hospital Stay (HOSPITAL_COMMUNITY): Payer: 59

## 2019-09-09 LAB — BASIC METABOLIC PANEL
Anion gap: 10 (ref 5–15)
BUN: 21 mg/dL — ABNORMAL HIGH (ref 6–20)
CO2: 28 mmol/L (ref 22–32)
Calcium: 9.4 mg/dL (ref 8.9–10.3)
Chloride: 101 mmol/L (ref 98–111)
Creatinine, Ser: 1.14 mg/dL (ref 0.61–1.24)
GFR calc Af Amer: >60 mL/min (ref >60)
GFR calc non Af Amer: >60 mL/min (ref >60)
Glucose, Bld: 107 mg/dL — ABNORMAL HIGH (ref 70–99)
Potassium: 4 mmol/L (ref 3.5–5.1)
Sodium: 139 mmol/L (ref 135–145)

## 2019-09-09 LAB — CBC
HCT: 38.6 % — ABNORMAL LOW (ref 39.0–52.0)
Hemoglobin: 12.7 g/dL — ABNORMAL LOW (ref 13.0–17.0)
MCH: 28.9 pg (ref 26.0–34.0)
MCHC: 32.9 g/dL (ref 30.0–36.0)
MCV: 87.7 fL (ref 80.0–100.0)
Platelets: 238 10*3/uL (ref 150–400)
RBC: 4.4 MIL/uL (ref 4.22–5.81)
RDW: 13.2 % (ref 11.5–15.5)
WBC: 10.3 10*3/uL (ref 4.0–10.5)
nRBC: 0 % (ref 0.0–0.2)

## 2019-09-09 MED ORDER — OXYCODONE HCL 5 MG PO TABS: 5.0000 mg | ORAL_TABLET | Freq: Four times a day (QID) | ORAL | 0 refills | Status: DC | PRN

## 2019-09-09 MED ORDER — METOPROLOL TARTRATE 25 MG PO TABS: 25.0000 mg | ORAL_TABLET | Freq: Two times a day (BID) | ORAL | 1 refills | Status: DC

## 2019-09-09 MED ORDER — ASPIRIN EC 81 MG PO TBEC: 81.0000 mg | DELAYED_RELEASE_TABLET | Freq: Every day | ORAL | Status: DC

## 2019-09-09 MED FILL — METOPROLOL TARTRATE 25 MG T: 25 | 30 days supply | Qty: 60 | Fill #0

## 2019-09-09 MED FILL — oxyCODONE HCL 5 MG TABS: 5 | 8 days supply | Qty: 30 | Fill #0

## 2019-09-09 MED FILL — Sodium Chloride IV Soln 0.9%: INTRAVENOUS | Qty: 2000 | Status: AC

## 2019-09-09 MED FILL — Sodium Bicarbonate IV Soln 8.4%: INTRAVENOUS | Qty: 50 | Status: AC

## 2019-09-09 MED FILL — Electrolyte-R (PH 7.4) Solution: INTRAVENOUS | Qty: 4000 | Status: AC

## 2019-09-09 MED FILL — Heparin Sodium (Porcine) Inj 1000 Unit/ML: INTRAMUSCULAR | Qty: 10 | Status: AC

## 2019-09-09 MED FILL — Lidocaine HCl Local Soln Prefilled Syringe 100 MG/5ML (2%): INTRAMUSCULAR | Qty: 5 | Status: AC

## 2019-09-09 MED FILL — Mannitol IV Soln 20%: INTRAVENOUS | Qty: 500 | Status: AC

## 2019-09-09 MED FILL — Potassium Chloride Inj 2 mEq/ML: INTRAVENOUS | Qty: 40 | Status: AC

## 2019-09-09 MED FILL — Heparin Sodium (Porcine) Inj 1000 Unit/ML: INTRAMUSCULAR | Qty: 30 | Status: AC

## 2019-09-09 MED FILL — Magnesium Sulfate Inj 50%: INTRAMUSCULAR | Qty: 10 | Status: AC

## 2019-09-09 NOTE — Progress Notes (Addendum)
EPW wires removed at this time. Patient tolerated well. Patient instructed on bedrest for an hour and verbalizes understanding. Med sternal and right leg harvest painted with betadine. Will continue to monitor.

## 2019-09-09 NOTE — Progress Notes (Addendum)
CARDIAC REHAB PHASE I   PRE:  Rate/Rhythm: 106 ST    BP: sitting 128/91    SaO2: 97 RA  MODE:  Ambulation: 740 ft   POST:  Rate/Rhythm: 106 ST    BP: sitting 148/90     SaO2: 98 RA  Pt moving well but needs reminders for sternal precautions. HR and BP elevated but just took all of his meds. No c/o. Discussed IS, sternal precautions, nicotine cessation, diet, exercise, and CRPII with pt and wife. Good reception, will refer to [place]. He is motivated to quit tobacco. Likes to exercise.  Pt is interested in participating in Virtual Cardiac and Pulmonary Rehab. Pt advised that Virtual Cardiac and Pulmonary Rehab is provided at no cost to the patient.  Checklist:  1. Pt has smart device  ie smartphone and/or ipad for downloading an app  Yes 2. Reliable internet/wifi service    Yes 3. Understands how to use their smartphone and navigate within an app.  Yes Pt verbalized understanding and is in agreement. Daniel Buck, ACSM 09/09/2019 9:32 AM

## 2019-09-09 NOTE — Plan of Care (Signed)
Discharge to home °

## 2019-09-09 NOTE — Progress Notes (Addendum)
      [address]       [place] [address]             [phone]      5 Days Post-Op Procedure(s) (LRB): CORONARY ARTERY BYPASS GRAFTING (CABG) using LIMA to LAD; Endoscopic Vein Harvest of Right Greater Saphenous: SVG to Diag1; SVG to OM1; SVG to PL. (N/A) TRANSESOPHAGEAL ECHOCARDIOGRAM (TEE) (N/A) ENDOVEIN HARVEST OF GREATER SAPHENOUS VEIN (Right) Subjective: Feels okay this morning. He wants to go home.   Objective: Vital signs in last 24 hours: Temp:  [98.1 F (36.7 C)-99.1 F (37.3 C)] 98.7 F (37.1 C) (06/15 0516) Pulse Rate:  [85-99] 99 (06/15 0516) Cardiac Rhythm: Normal sinus rhythm (06/15 0000) Resp:  [14-36] 25 (06/15 0516) BP: (110-144)/(80-87) 132/87 (06/15 0516) SpO2:  [90 %-100 %] 94 % (06/15 0516) FiO2 (%):  [21 %] 21 % (06/14 2100) Weight:  [89 kg] 89 kg (06/15 0500)  Intake/Output from previous day: 06/14 0701 - 06/15 0700 In: 920 [P.O.:920] Out: 525 [Urine:525] Intake/Output this shift: No intake/output data recorded.  General appearance: alert, cooperative and no distress Heart: regular rate and rhythm, S1, S2 normal, no murmur, click, rub or gallop Lungs: clear to auscultation bilaterally Abdomen: soft, non-tender; bowel sounds normal; no masses,  no organomegaly Extremities: extremities normal, atraumatic, no cyanosis or edema Wound: clean and dry  Lab Results: Recent Labs    09/08/19 0346 09/09/19 0301  WBC 11.2* 10.3  HGB 12.1* 12.7*  HCT 37.3* 38.6*  PLT 202 238   BMET:  Recent Labs    09/08/19 0346 09/09/19 0301  NA 140 139  K 3.5 4.0  CL 101 101  CO2 28 28  GLUCOSE 108* 107*  BUN 20 21*  CREATININE 1.17 1.14  CALCIUM 9.2 9.4    PT/INR: No results for input(s): LABPROT, INR in the last 72 hours. ABG    Component Value Date/Time   PHART 7.316 (L) 09/04/2019 1731   HCO3 23.1 09/04/2019 1731   TCO2 25 09/04/2019 1731   ACIDBASEDEF 3.0 (H) 09/04/2019 1731   O2SAT 98.0 09/04/2019 1731   CBG (last 3)    Recent Labs    09/07/19 2148 09/08/19 0634 09/08/19 1136  GLUCAP 98 142* 122*    Assessment/Plan: S/P Procedure(s) (LRB): CORONARY ARTERY BYPASS GRAFTING (CABG) using LIMA to LAD; Endoscopic Vein Harvest of Right Greater Saphenous: SVG to Diag1; SVG to OM1; SVG to PL. (N/A) TRANSESOPHAGEAL ECHOCARDIOGRAM (TEE) (N/A) ENDOVEIN HARVEST OF GREATER SAPHENOUS VEIN (Right)   1. CV-sinus tachycardia this morning. BP well controlled. Continue asa325/plavix. Continue metoprolol  2. Pulm- CXR shows small left pleural effusion. Tolerating room air with good oxygen saturation.  3. Renal-creatinine 1.14, electrolytes okay. Continue lasix for fluid overload.  4. H and H -12.7/38.6, expected acute blood loss anemia 5. Endo-blood glucose well controlled  Plan: EPW removed today. May be able to go home later today if he feels up to it. Discharge instructions reviewed with the patient and wife at the bedside and all questions were answered.    LOS: 5 days    Elgie Collard 09/09/2019   Chart reviewed, patient examined, agree with above. He looks good today and feels ready to go home.

## 2019-09-09 NOTE — Discharge Summary (Addendum)
[address]       [place], [address]             [phone]      Physician Discharge Summary  Patient ID: Daniel Buck MRN: [id number] DOB/AGE: Oct 31, 1965 54 y.o.  Admit date: 09/04/2019 Discharge date: 09/09/2019  Admission Diagnoses:  Patient Active Problem List   Diagnosis Date Noted   Pre-operative clearance 05/06/2019   Chest pain with moderate risk of acute coronary syndrome 01/04/2018   GERD (gastroesophageal reflux disease) 10/04/2016   Cervical spondylosis 10/04/2016   Orthostatic dizziness 01/20/2016   Memory loss 01/19/2016   Carpal tunnel syndrome, right 12/15/2015   Carpal tunnel syndrome on right 01/20/2015   Osteoarthritis of left wrist 01/20/2015   Heart palpitations 02/06/2014   Myocardial infarct, old    CAD S/P percutaneous coronary angioplasty    Hyperlipidemia LDL goal <70    Essential hypertension, benign 02/02/2011   Ulcerative colitis ([place]) 02/02/2011    Discharge Diagnoses:  Active Problems:   S/P CABG x 4   Discharged Condition: good  HPI:   The patient is a 54 year old gentleman with a history of premature coronary artery disease status post remote inferior MI in 1996 treated with a bare-metal stent to the RCA. He subsequently had in-stent restenosis that was treated with atherectomy in 2002. He had a nuclear stress test in 2012 that was low risk. He underwent preoperative cardiac clearance for an inguinal hernia repair in February 2021 and subsequently had that repaired without difficulty. A few weeks ago he was on a hunting trip for [place] in the [place] 9000 [place] elevation and noted frequent palpitations and shortness of breath with minimal exertion. He returned home and continued to have shortness of breath with exertion. He has had some pain in his left upper arm. He was moving furniture recently and had some shortness of breath and diaphoresis and also had some throat pain which was  similar to his pre-PCI symptoms. He says in retrospect he has been fatigued over the past year. He was evaluated by cardiology and underwent catheterization on 08/22/2019. This showed an eccentric ostial 40 to 50% left main stenosis followed by 50 to 70% distal left main stenosis. There was 65% mid LAD stenosis within the region with heavy calcification. The first diagonal branch had an 80% ostial stenosis. The left circumflex had 70% stenosis between the first and second marginal branches. The right coronary artery was a large vessel with diffuse in-stent restenosis up to 90% with competitive flow in the PDA from collateralization from the left coronary system. Left ventricular ejection fraction was 50 to 60% with an LVEDP of 17 mmHg.  The patient is married and lives with his wife. They live on [place] farm in [place]. He works as a Forensic scientist.    Hospital Course:   Daniel Buck is a 54 year old male patient who underwent a coronary bypass grafting x4 by Dr. Cyndia Bent on 09/04/2019.  He tolerated the procedure well was transferred to the surgical ICU for continued care.  He was extubated in a timely manner.  Postop day 1 he has nausea and overnight.  He added Reglan for the nausea.  Discontinued his chest tubes, Swan-Ganz catheter, and arterial line.  We initiated a diuretic regimen.  Postop day 2 he remained on nasal cannula at 10 L.  His creatinine climbed to 1.79 the night before but was back down to 1.3.  We continued his  Foley catheter 1 more day.  He did have some mild leukocytosis which we continue to trend.  Postop day 3 he did feel little bit better but was still having some nausea.  We have been able to discontinue his central line and Foley cath.  He diuresed 5 L therefore we held additional diuresis for now.  Postop day 4 she is feeling much better.  His pain was under better control.  He continued to diuresis and his weight was decreasing.  We continue to encourage flutter valve and  incentive spirometry use.  He was stable to transfer to 4 E. for continued care.  Postop day 5 on the floor he was progressing.  We removed his epicardial pacing wires since he was in normal sinus rhythm.  Today, he is tolerating room air, his incisions are healing well, he is ambulating with limited assistance, and he is ready for discharge home.   Consults: None  Significant Diagnostic Studies:   CLINICAL DATA:  Status post CABG  EXAM: CHEST - 2 VIEW  COMPARISON:  09/08/2019  FINDINGS: Moderate left pleural effusion with eventration of the left hemidiaphragm. Trace right pleural effusion. No pneumothorax.  The heart is normal in size. Postsurgical changes related to prior CABG.  Mild degenerative changes of the lower thoracic spine. Median sternotomy.  IMPRESSION: Moderate left pleural effusion with eventration of the left hemidiaphragm. Trace right pleural effusion. No pneumothorax.   Electronically Signed   By: Julian Hy M.D.   On: 09/09/2019 08:34  Treatments:  CARDIOVASCULAR SURGERY OPERATIVE NOTE  09/04/2019  Surgeon:  Gaye Pollack, MD  First Assistant: Nicholes Rough,  PA-C   Preoperative Diagnosis:  Severe multi-vessel coronary artery disease   Postoperative Diagnosis:  Same   Procedure:  1. Median Sternotomy 2. Extracorporeal circulation 3.   Coronary artery bypass grafting x 4   Left internal mammary artery graft to the LAD  SVG to diagonal  SVG to OM  SVG to PL (RCA) 4.   Endoscopic vein harvest from the right leg   Anesthesia:  General Endotracheal   Discharge Exam: Blood pressure 126/82, pulse 99, temperature 98.2 F (36.8 C), temperature source Oral, resp. rate 20, height 5' 11"  (1.803 m), weight 89 kg, SpO2 95 %.   General appearance: alert, cooperative and no distress Heart: regular rate and rhythm, S1, S2 normal, no murmur, click, rub or gallop Lungs: clear to auscultation bilaterally Abdomen:  soft, non-tender; bowel sounds normal; no masses,  no organomegaly Extremities: extremities normal, atraumatic, no cyanosis or edema Wound: clean and dry  Disposition:  Discharge disposition: 01-Home or Self Care       Discharge Instructions    Amb Referral to Cardiac Rehabilitation   Complete by: As directed    Diagnosis: CABG   CABG X ___: 4   After initial evaluation and assessments completed: Virtual Based Care may be provided alone or in conjunction with Phase 2 Cardiac Rehab based on patient barriers.: Yes     Allergies as of 09/09/2019   No Known Allergies     Medication List    STOP taking these medications   isosorbide mononitrate 30 MG 24 hr tablet Commonly known as: IMDUR   nitroGLYCERIN 0.4 MG SL tablet Commonly known as: NITROSTAT     TAKE these medications   acetaminophen 325 MG tablet Commonly known as: TYLENOL Take 650 mg by mouth every 6 (six) hours as needed for moderate pain.   amLODipine 5 MG tablet Commonly known as: NORVASC  Take 1 tablet (5 mg total) by mouth daily.   aspirin EC 81 MG tablet Take 1 tablet (81 mg total) by mouth daily. Start taking on: September 10, 2019   clopidogrel 75 MG tablet Commonly known as: PLAVIX TAKE 1 TABLET BY MOUTH DAILY.   esomeprazole 40 MG capsule Commonly known as: NEXIUM Take 40 mg by mouth daily.   fluticasone 50 MCG/ACT nasal spray Commonly known as: FLONASE Place 1 spray into both nostrils daily.   Lialda 1.2 g EC tablet Generic drug: mesalamine Take 1.2 g by mouth QID.   metoprolol tartrate 25 MG tablet Commonly known as: LOPRESSOR Take 1 tablet (25 mg total) by mouth 2 (two) times daily.   oxyCODONE 5 MG immediate release tablet Commonly known as: Oxy IR/ROXICODONE Take 1 tablet (5 mg total) by mouth every 6 (six) hours as needed for severe pain.   rosuvastatin 40 MG tablet Commonly known as: CRESTOR TAKE 1 TABLET (40 MG TOTAL) BY MOUTH DAILY.   Viibryd 40 MG Tabs Generic drug:  Vilazodone HCl Take 40 mg by mouth daily.   Vitamin D 125 MCG (5000 UT) Caps Take 5,000 Units by mouth daily.       Follow-up Information    [name], MD. Call in 1 day(s).   Specialty: Family Medicine Contact information: [address] [place] [address] [phone]        [name], MD .   Specialty: Cardiology Contact information: [address] [place] [address] [phone]        nursing suture removal Follow up.   Why: Your suture removal appointemnt is on 6/22 at 10:45am Contact information: Dr. Vivi Martens office       Cyndia Bent, Fernande Boyden, MD Follow up.   Specialty: Cardiothoracic Surgery Why: Your appointment is on 7/14 at 2:00pm. Please arrive at 1:30pm for chest xray located at [facility] which is on the first floor of our building Contact information: [place] [address] [phone]        Kilroy, Luke K, PA-C Follow up.   Specialties: Cardiology, Radiology Why: Your appointment is on 6/30 at 1:45pm. Please bring your hospital paperwork. Contact information: [place] [address] [phone]              The patient has been discharged on:   1.Beta Blocker:  Yes [ yes  ]                              No   [   ]                              If No, reason:  2.Ace Inhibitor/ARB: Yes [   ]                                     No  [ no   ]                                     If No, reason: elevated creatinine  3.Statin:   Yes [ yes  ]  No  [   ]                  If No, reason:  4.Ecasa:  Yes  [  yes ]                  No   [   ]                  If No, reason:    Signed: Elgie Collard 09/09/2019, 3:03 PM

## 2019-09-09 NOTE — Progress Notes (Signed)
IV and Telemetry discontinued at this time. CCMD notified. Discharge instructions reviewed with patient and patient's wife. All questions answered.

## 2019-09-09 NOTE — Plan of Care (Signed)
Continue to monitor

## 2019-09-09 NOTE — Discharge Instructions (Signed)

## 2019-09-09 NOTE — Progress Notes (Signed)
Mobility Specialist - Progress Note   09/09/19 1300  Mobility  Activity Ambulated in hall  Level of Assistance Independent  Assistive Device None  Distance Ambulated (ft) 950 ft  Mobility Response Tolerated well  Mobility performed by Family member  $Mobility charge 1 Mobility    Pt seen walking with family member, says this was his third walk for the day.   [place] Specialist

## 2019-09-10 ENCOUNTER — Telehealth (HOSPITAL_COMMUNITY): Payer: Self-pay

## 2019-09-10 NOTE — Telephone Encounter (Signed)
Pt insurance is active and benefits verified through [facility]. Co-pay $0.00, DED $300.00/$300.00 met, out of pocket $7,900.00/$7,635.06 met, co-insurance 20%. No pre-authorization required. Passport, 09/10/19 @ 219PM, [id number]  Will contact patient to see if he is interested in the Cardiac Rehab Program. If interested, patient will need to complete follow up appt. Once completed, patient will be contacted for scheduling upon review by the RN Navigator.

## 2019-09-10 NOTE — Telephone Encounter (Signed)
Called patient to see if he is interested in the Cardiac Rehab Program. Patient expressed interest. Explained scheduling process and went over insurance, patient verbalized understanding. Will contact patient for scheduling once f/u has been completed.  °

## 2019-09-11 ENCOUNTER — Other Ambulatory Visit: Payer: Self-pay | Admitting: *Deleted

## 2019-09-11 ENCOUNTER — Encounter: Payer: Self-pay | Admitting: *Deleted

## 2019-09-11 NOTE — Patient Outreach (Signed)
[facility]) Care Management  09/11/2019  Daniel Buck 07-Jan-1966 [id number]   Transition of care call/case closure   Referral received:09/02/19 Initial outreach:09/11/19 Insurance: UMR    Subjective: Initial successful telephone call to patient's preferred number in order to complete transition of care assessment; Patient wife answers, Daniel Buck, DPR,  2 HIPAA identifiers verified. Explained purpose of call and completed transition of care assessment.  States patient is doing pretty good  denies post-operative problems, says surgical incisions are unremarkable, states surgical pain well managed with prescribed medications,taking tylenol as needed. She reports that he is  tolerating diet, slowly improving with appetite. Discussed small frequent meals, he denies nausea. She report that he is having daily bowel movements. Patient continues to weigh daily and keeping record today weight 192 and weight on yesterday 190.6, reinforced discharge instruction on when to notify MD of sudden weight gain, she denies patient having increase in swelling or shortness of breath. Wife reports patient tolerating showering and walking 3 to 4 times a day since being home  Spouse is  assisting with hisrecovery.   She does not have the hospital indemnity He uses  a [facility] outpatient pharmacy at [facility] outpatient .  Objective:  Daniel Buck was hospitalized at [facility] from 6/10-6/15/21 for CABG x 4, Comorbidities include:  He was discharged to home on 09/09/19 without the need for home health services or DME.   Assessment:  Patient voices good understanding of all discharge instructions.  See transition of care flowsheet for assessment details.   Plan:  Reviewed hospital discharge diagnosis of CABG x4   and discharge treatment plan using hospital discharge instructions, assessing medication adherence, reviewing problems requiring provider notification, and discussing the importance  of follow up with surgeon, primary care provider and/or specialists as directed.  Reviewed [place] healthy lifestyle program information to receive discounted premium for  2022   Step 1: Get  your annual physical  Step 2: Complete your health assessment  Step 3:Identify your current health status and complete the corresponding action step between January 1, and November 26, 2019.     No ongoing care management needs identified so will close case to [facility] Management services and route successful outreach letter with [place] Management pamphlet and 24 Hour Nurse Line Magnet to [place] Management clinical pool to be mailed to patient's home address.   Joylene Draft, RN, BSN  [place] Management Coordinator  [phone]- Mobile [phone]- Toll Free Main Office

## 2019-09-12 LAB — ECHO INTRAOPERATIVE TEE
Height: 71 in
Weight: 3200 oz

## 2019-09-16 ENCOUNTER — Other Ambulatory Visit: Payer: Self-pay

## 2019-09-16 ENCOUNTER — Ambulatory Visit (INDEPENDENT_AMBULATORY_CARE_PROVIDER_SITE_OTHER): Payer: Self-pay

## 2019-09-16 DIAGNOSIS — Z951 Presence of aortocoronary bypass graft: Secondary | ICD-10-CM

## 2019-09-16 DIAGNOSIS — Z4802 Encounter for removal of sutures: Secondary | ICD-10-CM

## 2019-09-16 NOTE — Progress Notes (Signed)
Pt comes in today for suture removal.  He is s/p CABG on 09/04/19. He is a/o x 4 and w/o complaint. Three chest tube incision sites to upper abdomen w/ sutures intact, incisions well approximated, and without s/s of infection. Sutures removed per standard protocol w/o difficulty. Instructed pt to keep sites clean (w/ mild soap and water) and dry. To notify office of any s/s of infection or other problems.

## 2019-09-17 DIAGNOSIS — H47011 Ischemic optic neuropathy, right eye: Secondary | ICD-10-CM | POA: Diagnosis not present

## 2019-09-19 ENCOUNTER — Ambulatory Visit (INDEPENDENT_AMBULATORY_CARE_PROVIDER_SITE_OTHER): Payer: Self-pay | Admitting: Physician Assistant

## 2019-09-19 ENCOUNTER — Other Ambulatory Visit: Payer: Self-pay

## 2019-09-19 ENCOUNTER — Telehealth: Payer: Self-pay

## 2019-09-19 ENCOUNTER — Ambulatory Visit
Admission: RE | Admit: 2019-09-19 | Discharge: 2019-09-19 | Disposition: A | Discharge status: Home / Self Care | Payer: 59 | Source: Ambulatory Visit | Attending: Surgery | Admitting: Surgery

## 2019-09-19 VITALS — BP 122/80 | HR 81 | Temp 97.9°F | Resp 16 | Ht 71.0 in | Wt 194.0 lb

## 2019-09-19 DIAGNOSIS — Z951 Presence of aortocoronary bypass graft: Secondary | ICD-10-CM

## 2019-09-19 DIAGNOSIS — R079 Chest pain, unspecified: Secondary | ICD-10-CM | POA: Diagnosis not present

## 2019-09-19 DIAGNOSIS — M7918 Myalgia, other site: Secondary | ICD-10-CM

## 2019-09-19 MED ORDER — FUROSEMIDE 40 MG PO TABS: 40.0000 mg | ORAL_TABLET | Freq: Every day | ORAL | 0 refills | Status: DC

## 2019-09-19 MED ORDER — COLCHICINE 0.6 MG PO TABS
0.6000 mg | ORAL_TABLET | Freq: Every day | ORAL | 2 refills | Status: DC
Start: 2019-09-19 — End: 2019-11-26

## 2019-09-19 MED ORDER — POTASSIUM CHLORIDE ER 20 MEQ PO TBCR
10.0000 meq | EXTENDED_RELEASE_TABLET | Freq: Every day | ORAL | 0 refills | Status: DC
Start: 2019-09-19 — End: 2019-11-12

## 2019-09-19 MED FILL — FUROSEMIDE 40 MG TAB: 40 | 5 days supply | Qty: 5 | Fill #0

## 2019-09-19 MED FILL — POTASSIUM CHLORIDE CRYS ER: 20 | 5 days supply | Qty: 5 | Fill #0

## 2019-09-19 MED FILL — COLCHICINE 0.6 MG TABS: 0.6 | 30 days supply | Qty: 30 | Fill #0

## 2019-09-19 NOTE — Progress Notes (Signed)
Patient contacted office with complaints of right sided chest discomfort.  He states its worse at night and is sometimes sharp.  He also states he is having issues with gout, which he hasn't experienced in years.  He has been sleeping on his right side and has been working with a Programmer, applications.  Finally he feels like his ankles are swollen  BP 122/80 (BP Location: Right Arm, Patient Position: Sitting, Cuff Size: Normal)   Pulse 81   Temp 97.9 F (36.6 C) (Temporal)   Resp 16   Ht 5' 11"  (1.803 m)   Wt 194 lb (88 kg)   SpO2 97% Comment: RA  BMI 27.06 kg/m   Gen: no apparent distress Heart: RRR Lungs; Diminished left base Ext: no edema Incisions: C/D/I   CXR: left sided pleural effusion, improved from previous CXR.[name] no pneumothorax present   A/P:  1. Right sided musculoskeletal strain- patient has been sleeping on right side, also has been doing some wood work... Patient provided education on sternal precautions and avoid sleeping on this right side.  I suspect the sleeping and use of his wood working machine is the source of his discomfort 2. Gout- H/O in the past, not uncommon to flair up, will provide Colchicine 3. Left Pleural effusion- improved since hospital discharge, will give Lasix 40 mg daily x 5 days, supplement K  4. RTC as scheduled with Dr. Cyndia Bent with repeat CXR, he will contact office sooner if need arises or he feels like his right sided pain isn't improving   Ellwood Handler, PA-C

## 2019-09-19 NOTE — Telephone Encounter (Signed)
TC from pt's wife, reporting that pt has had 2-3 episodes of R chest pain since last night. Pt is s/p CABG by Dr. Cyndia Bent on Reports that pain is sharp and that he is sob when the pain occurs. Pt is currently not experiencing pain or sob. Wife also states that pt's R ankle is swollen and sore. She does not notice any s/s of infection to R inner calf incision. Instructed to come for OV w/ CXR this morning.

## 2019-09-24 ENCOUNTER — Other Ambulatory Visit: Payer: Self-pay

## 2019-09-24 ENCOUNTER — Ambulatory Visit: Payer: 59 | Admitting: Cardiology

## 2019-09-24 ENCOUNTER — Encounter: Payer: Self-pay | Admitting: Cardiology

## 2019-09-24 VITALS — BP 118/72 | HR 90 | Ht 71.0 in | Wt 194.4 lb

## 2019-09-24 DIAGNOSIS — I10 Essential (primary) hypertension: Secondary | ICD-10-CM

## 2019-09-24 DIAGNOSIS — Z951 Presence of aortocoronary bypass graft: Secondary | ICD-10-CM | POA: Diagnosis not present

## 2019-09-24 DIAGNOSIS — K51919 Ulcerative colitis, unspecified with unspecified complications: Secondary | ICD-10-CM | POA: Diagnosis not present

## 2019-09-24 DIAGNOSIS — Z9861 Coronary angioplasty status: Secondary | ICD-10-CM

## 2019-09-24 DIAGNOSIS — I2510 Atherosclerotic heart disease of native coronary artery without angina pectoris: Secondary | ICD-10-CM

## 2019-09-24 DIAGNOSIS — E785 Hyperlipidemia, unspecified: Secondary | ICD-10-CM | POA: Diagnosis not present

## 2019-09-24 NOTE — Progress Notes (Signed)
Cardiology Office Note:    Date:  09/24/2019   ID:  Daniel Buck, DOB 1965/08/08, MRN [id number]  PCP:  [name], MD  Cardiologist:  Glenetta Hew, MD  Electrophysiologist:  None   Referring MD: [name], MD   CC: post CABG follow up  History of Present Illness:    Daniel Buck is a 54 y.o. male with a hx of early CAD. He had a remote inferior MI in 1996 treated with RCA BMS. He had in-stent restenosis treated with atherectomy in 2002. His last functional study was a nuclear stress in 2012 and this was low risk. He hasdone well from a cardiac standpoint since. He lives on a farm in [place], he lives near where Dr. Rollene Fare keeps his horses. He was contacted last  05/06/2019 for preoperative clearance prior to inguinal hernia repair. He was cleared and tolerated this well.  He was seen in the office in May 2021 and complained of exertional chest discomfort similar to his pre PCI symptoms.  He was set up for an OP cath 08/22/2019.  This revealed significant progression of disease as well as disease in the LM.  On 09/04/2019 he underwent elective CABG x 4 with an LIMA-LAD, SVG-OM, SVG-Dx, and an SVG-RPLA.  He tolerated this well and is seen today as a post op follow up.   Since discharge he has done well overall.  He does complain of fatigue and I reassured him this was not unusual and to give it time.  He did have a small-moderate Lt pleural effusion by CXR 09/19/2019.  He denies orthopnea or pleuritic pain currently.  He has had occasional skipped beats but no sustained tachycardia. He has been walking a mile twice a day. 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   Past Medical History:  Diagnosis Date  . CAD S/P percutaneous coronary angioplasty 1996, 2002   a. s/p Inf MI (age 38) >>PCI to RCA with tandem BMS (PS 1535); b. LHC 06/2000: pRCA 30-40%, prox/mid stents with 75% ISR, 30-40% at crux in  RCA >> PCI: Cutting Balloon atherectomy for ISR; c. 2012 MV: inf ischemia, no scar.  . Carpal tunnel syndrome on left 12/2011  . Chronic nasal congestion   . Depression 01/19/2012  . GERD (gastroesophageal reflux disease)   . Gout   . History of Doppler ultrasound    a. Carotid US 3/11: no ICA stenosis   . History of echocardiogram    a. Echo 6/14: mod LVH, EF 50-55%, inf-lat HK, Gr 1 DD, mild LAE  . History of hiatal hernia   . Hyperlipidemia LDL goal < 70   . Hypertension    under control; has been on med. since 1996  . Myocardial infarct, old 1996   PCI  . Sleep apnea    no testing yet  . Ulcerative colitis [facility])     Past Surgical History:  Procedure Laterality Date  . CARPAL TUNNEL RELEASE  01/26/2012   Procedure: CARPAL TUNNEL RELEASE;  Surgeon: Cammie Sickle., MD;  Location: [place];  Service: Orthopedics;  Laterality: Left;  . CORONARY ANGIOPLASTY  07/11/2000   Cutting Balloon PTCA for RCA ISR  . CORONARY ARTERY BYPASS GRAFT N/A 09/04/2019   Procedure: CORONARY ARTERY BYPASS GRAFTING (CABG) using LIMA to LAD; Endoscopic Vein Harvest of Right Greater Saphenous: SVG to Diag1; SVG to OM1; SVG to PL.;  Surgeon:  Gaye Pollack, MD;  Location[facility];  Service: Open Heart Surgery;  Laterality: N/A;  . CORONARY STENT PLACEMENT  1996   [id number] BMS x 2 in RCA  . CYSTECTOMY  as a child   from neck  . ENDOVEIN HARVEST OF GREATER SAPHENOUS VEIN Right 09/04/2019   Procedure: ENDOVEIN HARVEST OF GREATER SAPHENOUS VEIN;  Surgeon: Gaye Pollack, MD;  Location: [place];  Service: Open Heart Surgery;  Laterality: Right;  Endovein scope harvest of right upper and lower LE.  [name] HERNIA REPAIR    . LEFT HEART CATH AND CORONARY ANGIOGRAPHY N/A 08/22/2019   Procedure: LEFT HEART CATH AND CORONARY ANGIOGRAPHY;  Surgeon: Belva Crome, MD;  Location: [facility];  Service: Cardiovascular;  Laterality: N/A;  . NM MYOVIEW LTD  December 2012   Inferior infarct but no ischemia   . TEE WITHOUT CARDIOVERSION N/A 09/04/2019   Procedure: TRANSESOPHAGEAL ECHOCARDIOGRAM (TEE);  Surgeon: Gaye Pollack, MD;  Location: [place];  Service: Open Heart Surgery;  Laterality: N/A;  . TONSILLECTOMY  as a child  . TRANSTHORACIC ECHOCARDIOGRAM  08/2017   Normal echo-normal LV size and function.  EF 55-60%.  No R WMA.  No ASD/PFO.  No valve lesions.    Current Medications: Current Meds  Medication Sig  . acetaminophen (TYLENOL) 325 MG tablet Take 650 mg by mouth every 6 (six) hours as needed for moderate pain.  [name] amLODipine (NORVASC) 5 MG tablet Take 1 tablet (5 mg total) by mouth daily.  [name] aspirin EC 81 MG tablet Take 1 tablet (81 mg total) by mouth daily.  . Cholecalciferol (VITAMIN D) 125 MCG (5000 UT) CAPS Take 5,000 Units by mouth daily.  . clopidogrel (PLAVIX) 75 MG tablet TAKE 1 TABLET BY MOUTH DAILY. (Patient taking differently: Take 75 mg by mouth daily. )  . esomeprazole (NEXIUM) 40 MG capsule Take 40 mg by mouth daily.   . fluticasone (FLONASE) 50 MCG/ACT nasal spray Place 1 spray into both nostrils daily.  [name] LIALDA 1.2 g EC tablet Take 1.2 g by mouth QID.   [name] metoprolol tartrate (LOPRESSOR) 25 MG tablet Take 1 tablet (25 mg total) by mouth 2 (two) times daily.  . potassium chloride 20 MEQ TBCR Take 10 mEq by mouth daily.  . rosuvastatin (CRESTOR) 40 MG tablet TAKE 1 TABLET (40 MG TOTAL) BY MOUTH DAILY.  . Vilazodone HCl (VIIBRYD) 40 MG TABS Take 40 mg by mouth daily.  . [DISCONTINUED] furosemide (LASIX) 40 MG tablet Take 1 tablet (40 mg total) by mouth daily.  . [DISCONTINUED] oxyCODONE (OXY IR/ROXICODONE) 5 MG immediate release tablet Take 1 tablet (5 mg total) by mouth every 6 (six) hours as needed for severe pain.     Allergies:   Patient has no known allergies.   Social History   Socioeconomic History  . Marital status: Married    Spouse name: Not on file  . Number of children: Not on file  . Years of education: Not on file  . Highest education level: Not on  file  Occupational History  . Not on file  Tobacco Use  . Smoking status: Former Smoker    Quit date: 03/27/1992    Years since quitting: 27.5  . Smokeless tobacco: Current User    Types: Snuff, Chew  Substance and Sexual Activity  . Alcohol use: No  . Drug use: No  . Sexual activity: Not on file  Other Topics Concern  . Not on file  Social  History Narrative   He lives in [place], [place] near [place] with his wife. He lives on a small farm. He is extremely active around the farm, but does not have a routine exercise regimen.   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   He quit smoking in 1994.   Social Determinants of Health   Financial Resource Strain:   . Difficulty of Paying Living Expenses:   Food Insecurity:   . Worried About Charity fundraiser in the Last Year:   . Arboriculturist in the Last Year:   Transportation Needs:   . Film/video editor (Medical):   [name] Lack of Transportation (Non-Medical):   Physical Activity:   . Days of Exercise per Week:   . Minutes of Exercise per Session:   Stress:   . Feeling of Stress :   Social Connections:   . Frequency of Communication with Friends and Family:   . Frequency of Social Gatherings with Friends and Family:   . Attends Religious Services:   . Active Member of Clubs or Organizations:   . Attends Archivist Meetings:   [name] Marital Status:      Family History: The patient's family history includes AAA (abdominal aortic aneurysm) in his father; Aortic stenosis in his mother; Atrial fibrillation in his father; CAD (age of onset: 69) in his father.  ROS:   Please see the history of present illness.     All other systems reviewed and are negative.  EKGs/Labs/Other Studies Reviewed:    The following studies were reviewed today:  CXR 09/19/2019- IMPRESSION: Left base atelectasis with small to moderate left pleural effusion.  EKG:  EKG is ordered today.  The ekg ordered today demonstrates NSR- HR  83  Recent Labs: 09/02/2019: ALT 33 09/05/2019: Magnesium 1.9 09/09/2019: BUN 21; Creatinine, Ser 1.14; Hemoglobin 12.7; Platelets 238; Potassium 4.0; Sodium 139  Recent Lipid Panel    Component Value Date/Time   CHOL 224 (H) 08/21/2017 1236   TRIG 263 (H) 08/21/2017 1236   HDL 40 08/21/2017 1236   CHOLHDL 5.6 (H) 08/21/2017 1236   CHOLHDL 6.6 06/14/2016 0822   VLDL 25 06/14/2016 0822   LDLCALC 131 (H) 08/21/2017 1236       VS:  BP 118/72   Pulse 90   Ht 5' 11"  (1.803 m)   Wt 194 lb 6.4 oz (88.2 kg)   SpO2 97%   BMI 27.11 kg/m     Wt Readings from Last 3 Encounters:  09/24/19 194 lb 6.4 oz (88.2 kg)  09/19/19 194 lb (88 kg)  09/09/19 196 lb 1.6 oz (89 kg)     GEN:  Well nourished, well developed in no acute distress HEENT: Normal NECK: No JVD; No carotid bruits CARDIAC: RRR, no murmurs, rubs, gallops RESPIRATORY:  Decreased breath sounds Lt base,otherwise clear to auscultation without rales, wheezing or rhonchi  ABDOMEN: Soft, non-tender, non-distendedPhysical Exam:  MUSCULOSKELETAL:  No edema; No deformity  SKIN: Warm and dry NEUROLOGIC:  Alert and oriented x 3 PSYCHIATRIC:  Normal affect   ASSESSMENT:    CABG- S/P elective CABG x 4 after cath 08/22/2019 showed progression of CAD.  H/O CAD- PCI to RCA with tandem BMS (PS 1535); 2002: Cutting Balloon atherectomy for ISR  HTN- Controlled  Ulcerative colitis- Prior to CABG he was on Plavix alone- now ASA and Plavix.  I suggested he speak with Dr Cyndia Bent about the duration of ASA therapy.  He is currently stable, no  GI bleeding.   HLD- He is on statin but no recent lipids in the chart, will order lipids before his OV with Dr Ellyn Hack in two months.  PLAN:    Plan: Same Rx.  Keep f/u with Dr Ellyn Hack and Dr Cyndia Bent as scheduled.   Medication Adjustments/Labs and Tests Ordered: Current medicines are reviewed at length with the patient today.  Concerns regarding medicines are outlined above.  No orders of the  defined types were placed in this encounter.  No orders of the defined types were placed in this encounter.   There are no Patient Instructions on file for this visit.   Signed, Kerin Ransom, PA-C  09/24/2019 1:49 PM    [facility]

## 2019-09-24 NOTE — Patient Instructions (Signed)
Medication Instructions:  Your physician recommends that you continue on your current medications as directed. Please refer to the Current Medication list given to you today.  *If you need a refill on your cardiac medications before your next appointment, please call your pharmacy*  Follow-Up: At [facility], you and your health needs are our priority.  As part of our continuing mission to provide you with exceptional heart care, we have created designated Provider Care Teams.  These Care Teams include your primary Cardiologist (physician) and Advanced Practice Providers (APPs -  Physician Assistants and Nurse Practitioners) who all work together to provide you with the care you need, when you need it.  We recommend signing up for the patient portal called "MyChart".  Sign up information is provided on this After Visit Summary.  MyChart is used to connect with patients for Virtual Visits (Telemedicine).  Patients are able to view lab/test results, encounter notes, upcoming appointments, etc.  Non-urgent messages can be sent to your provider as well.   To learn more about what you can do with MyChart, go to [email or web address].    Your next appointment:   2 month(s)  The format for your next appointment:   In Person  Provider:   Glenetta Hew, MD

## 2019-09-26 MED FILL — VIIBRYD 40 MG TABLET: 40 | 90 days supply | Qty: 90 | Fill #1

## 2019-10-01 ENCOUNTER — Other Ambulatory Visit: Payer: Self-pay | Admitting: Surgery

## 2019-10-01 DIAGNOSIS — Z951 Presence of aortocoronary bypass graft: Secondary | ICD-10-CM

## 2019-10-01 MED FILL — predniSONE 5 MG TABS: 5 | 30 days supply | Qty: 30 | Fill #1

## 2019-10-06 DIAGNOSIS — R202 Paresthesia of skin: Secondary | ICD-10-CM | POA: Diagnosis not present

## 2019-10-06 DIAGNOSIS — D179 Benign lipomatous neoplasm, unspecified: Secondary | ICD-10-CM | POA: Diagnosis not present

## 2019-10-07 ENCOUNTER — Telehealth (HOSPITAL_COMMUNITY): Payer: Self-pay

## 2019-10-07 NOTE — Telephone Encounter (Signed)
Called patient to see if he was interested in participating in the Cardiac Rehab Program. Patient stated not at this time.  Closed referral

## 2019-10-08 ENCOUNTER — Other Ambulatory Visit: Payer: Self-pay

## 2019-10-08 ENCOUNTER — Encounter: Payer: Self-pay | Admitting: Surgery

## 2019-10-08 ENCOUNTER — Ambulatory Visit
Admission: RE | Admit: 2019-10-08 | Discharge: 2019-10-08 | Disposition: A | Discharge status: Home / Self Care | Payer: 59 | Source: Ambulatory Visit | Attending: Surgery | Admitting: Surgery

## 2019-10-08 ENCOUNTER — Ambulatory Visit (INDEPENDENT_AMBULATORY_CARE_PROVIDER_SITE_OTHER): Payer: Self-pay | Admitting: Surgery

## 2019-10-08 ENCOUNTER — Ambulatory Visit: Payer: 59 | Admitting: Surgery

## 2019-10-08 VITALS — BP 120/83 | HR 80 | Temp 97.8°F | Resp 20 | Ht 71.0 in | Wt 195.0 lb

## 2019-10-08 DIAGNOSIS — Z951 Presence of aortocoronary bypass graft: Secondary | ICD-10-CM

## 2019-10-08 DIAGNOSIS — I2510 Atherosclerotic heart disease of native coronary artery without angina pectoris: Secondary | ICD-10-CM

## 2019-10-08 DIAGNOSIS — J90 Pleural effusion, not elsewhere classified: Secondary | ICD-10-CM | POA: Diagnosis not present

## 2019-10-08 MED ORDER — METOPROLOL TARTRATE 25 MG PO TABS: 25.0000 mg | ORAL_TABLET | Freq: Two times a day (BID) | ORAL | 1 refills | Status: DC

## 2019-10-08 MED FILL — METOPROLOL TARTRATE 25 MG T: 25 | 30 days supply | Qty: 60 | Fill #0

## 2019-10-08 NOTE — Progress Notes (Signed)
HPI: Patient returns for routine postoperative follow-up having undergone coronary artery bypass graft surgery x4 on 09/04/2019. The patient's early postoperative recovery while in the hospital was notable for an uncomplicated postoperative course.  He was seen in our office on 09/19/2019 by one of the PAs and was complaining of a gout flare as well as some right-sided chest wall discomfort.  Chest x-ray showed a moderate left pleural effusion.  He was started on colchicine and Lasix for 5 days. Since last seen in our office the patient reports that he has been feeling well.  He is now walking about 1/2 to 2 miles per day without chest pain or shortness of breath.  His right chest wall discomfort has resolved.  His right chest wall discomfort has resolved.  He has still has problems with gout and started colchicine.   Current Outpatient Medications  Medication Sig Dispense Refill  . acetaminophen (TYLENOL) 325 MG tablet Take 650 mg by mouth every 6 (six) hours as needed for moderate pain.    [name] amLODipine (NORVASC) 5 MG tablet Take 1 tablet (5 mg total) by mouth daily. 90 tablet 3  . aspirin EC 81 MG tablet Take 1 tablet (81 mg total) by mouth daily.    . Cholecalciferol (VITAMIN D) 125 MCG (5000 UT) CAPS Take 5,000 Units by mouth daily.    . clopidogrel (PLAVIX) 75 MG tablet TAKE 1 TABLET BY MOUTH DAILY. (Patient taking differently: Take 75 mg by mouth daily. ) 90 tablet 2  . esomeprazole (NEXIUM) 40 MG capsule Take 40 mg by mouth daily.     [name] LIALDA 1.2 g EC tablet Take 1.2 g by mouth QID.     [name] metoprolol tartrate (LOPRESSOR) 25 MG tablet Take 1 tablet (25 mg total) by mouth 2 (two) times daily. 60 tablet 1  . rosuvastatin (CRESTOR) 40 MG tablet TAKE 1 TABLET (40 MG TOTAL) BY MOUTH DAILY. 90 tablet 1  . Vilazodone HCl (VIIBRYD) 40 MG TABS Take 40 mg by mouth daily.    . colchicine 0.6 MG tablet Take 1 tablet (0.6 mg total) by mouth daily. (Patient not taking: Reported on 09/24/2019) 30 tablet  2  . potassium chloride 20 MEQ TBCR Take 10 mEq by mouth daily. 5 tablet 0   No current facility-administered medications for this visit.    Physical Exam: BP 120/83   Pulse 80   Temp 97.8 F (36.6 C) (Skin)   Resp 20   Ht 5' 11"  (1.803 m)   Wt 195 lb (88.5 kg)   SpO2 98% Comment: RA  BMI 27.20 kg/m  He looks well. Cardiac exam shows a regular rate and rhythm with normal heart sounds. Lung exam reveals slight decrease in breath sounds at the left base. Chest incision is healing well and the sternum is stable. His right leg incision is healing well and there is no peripheral edema.  Diagnostic Tests:  CLINICAL DATA:  Status post coronary bypass graft.  EXAM: CHEST - 2 VIEW  COMPARISON:  September 19, 2019.  FINDINGS: The heart size and mediastinal contours are within normal limits. No pneumothorax is noted. Right lung is clear. Small left pleural effusion is noted. The visualized skeletal structures are unremarkable.  IMPRESSION: Small left pleural effusion.   Electronically Signed   By: Marijo Conception M.D.   On: 10/08/2019 09:46   Impression:  Overall he is doing well approximately 1 month following coronary bypass surgery.  He has continued to abstain from dipping  tobacco products.  I encouraged him to continue walking as much possible.  I encouraged him to continue to walk as much as possible.  I told him he could return driving a car but should refrain from lifting anything heavier than 10 pounds for 3 months postoperatively.  I refilled his metoprolol today.  Plan:  He will continue to follow-up with cardiology and his PCP.  He will return to see me if he has any problems with his incisions.   Gaye Pollack, MD [facility] [phone]

## 2019-10-13 MED FILL — ROSUVASTATIN CALCIUM 40 MG: 40 | 90 days supply | Qty: 90 | Fill #1

## 2019-10-15 MED FILL — ALLOPURINOL 100 MG TABS: 100 | 30 days supply | Qty: 30 | Fill #0

## 2019-10-20 DIAGNOSIS — G5601 Carpal tunnel syndrome, right upper limb: Secondary | ICD-10-CM | POA: Diagnosis not present

## 2019-10-20 DIAGNOSIS — M25531 Pain in right wrist: Secondary | ICD-10-CM | POA: Diagnosis not present

## 2019-10-28 DIAGNOSIS — N50811 Right testicular pain: Secondary | ICD-10-CM | POA: Diagnosis not present

## 2019-10-28 DIAGNOSIS — R102 Pelvic and perineal pain: Secondary | ICD-10-CM | POA: Diagnosis not present

## 2019-11-03 MED FILL — COLCHICINE 0.6 MG TABS: 0.6 | 10 days supply | Qty: 30 | Fill #0

## 2019-11-03 MED FILL — METOPROLOL TARTRATE 25 MG T: 25 | 30 days supply | Qty: 60 | Fill #1

## 2019-11-11 ENCOUNTER — Other Ambulatory Visit: Payer: Self-pay | Admitting: Surgery

## 2019-11-11 DIAGNOSIS — Z951 Presence of aortocoronary bypass graft: Secondary | ICD-10-CM

## 2019-11-12 ENCOUNTER — Ambulatory Visit
Admission: RE | Admit: 2019-11-12 | Discharge: 2019-11-12 | Disposition: A | Discharge status: Home / Self Care | Payer: 59 | Source: Ambulatory Visit | Attending: Surgery | Admitting: Surgery

## 2019-11-12 ENCOUNTER — Encounter: Payer: Self-pay | Admitting: Surgery

## 2019-11-12 ENCOUNTER — Other Ambulatory Visit: Payer: Self-pay

## 2019-11-12 ENCOUNTER — Ambulatory Visit (INDEPENDENT_AMBULATORY_CARE_PROVIDER_SITE_OTHER): Payer: Self-pay | Admitting: Surgery

## 2019-11-12 VITALS — BP 116/78 | HR 66 | Temp 97.7°F | Resp 20 | Ht 71.0 in | Wt 200.0 lb

## 2019-11-12 DIAGNOSIS — Z951 Presence of aortocoronary bypass graft: Secondary | ICD-10-CM

## 2019-11-12 DIAGNOSIS — I2510 Atherosclerotic heart disease of native coronary artery without angina pectoris: Secondary | ICD-10-CM

## 2019-11-12 MED FILL — ALLOPURINOL 300 MG TABS: 300 | 30 days supply | Qty: 30 | Fill #0

## 2019-11-12 NOTE — Progress Notes (Signed)
HPI:  Patient returns for routine postoperative follow-up having undergone coronary artery bypass graft surgery x4 on 09/04/2019.  Last saw him on 10/08/2019 and he was doing well.  He said that he began having some burning discomfort along the lower end of his chest incision and was concerned about whether something was wrong.  It is not there all the time but comes and goes.  He has not been doing any unusual activity and has maintaining sternal precautions.  He has been walking daily and increasing his speed to the point where he gets fatigued and is sweating.  He feels like his stamina is improving.  Current Outpatient Medications  Medication Sig Dispense Refill  . acetaminophen (TYLENOL) 325 MG tablet Take 650 mg by mouth every 6 (six) hours as needed for moderate pain.    [name] amLODipine (NORVASC) 5 MG tablet Take 1 tablet (5 mg total) by mouth daily. 90 tablet 3  . aspirin EC 81 MG tablet Take 1 tablet (81 mg total) by mouth daily.    . Cholecalciferol (VITAMIN D) 125 MCG (5000 UT) CAPS Take 5,000 Units by mouth daily.    . clopidogrel (PLAVIX) 75 MG tablet TAKE 1 TABLET BY MOUTH DAILY. (Patient taking differently: Take 75 mg by mouth daily. ) 90 tablet 2  . colchicine 0.6 MG tablet Take 1 tablet (0.6 mg total) by mouth daily. 30 tablet 2  . esomeprazole (NEXIUM) 40 MG capsule Take 40 mg by mouth daily.     [name] LIALDA 1.2 g EC tablet Take 1.2 g by mouth QID.     [name] metoprolol tartrate (LOPRESSOR) 25 MG tablet Take 1 tablet (25 mg total) by mouth 2 (two) times daily. 60 tablet 1  . rosuvastatin (CRESTOR) 40 MG tablet TAKE 1 TABLET (40 MG TOTAL) BY MOUTH DAILY. 90 tablet 1  . Vilazodone HCl (VIIBRYD) 40 MG TABS Take 40 mg by mouth daily.    [name] allopurinol (ZYLOPRIM) 100 MG tablet Take 100 mg by mouth daily.     No current facility-administered medications for this visit.     Physical Exam: BP 116/78   Pulse 66   Temp 97.7 F (36.5 C) (Skin)   Resp 20   Ht 5' 11"  (1.803 m)   Wt 200  lb (90.7 kg)   SpO2 98% Comment: RA  BMI 27.89 kg/m  He looks well. Cardiac exam shows regular rate and rhythm with normal heart sounds. Lungs are clear. A chest incision is healing well.  There is slight thickening of the scar at the lower end for several centimeters.  This is the area where he has been having the burning pain.  There is no erythema.  The sternum feels stable.  Diagnostic Tests:  CLINICAL DATA:  Status post coronary artery bypass grafting  EXAM: CHEST - 2 VIEW  COMPARISON:  October 08, 2019  FINDINGS: Lungs are clear. Heart size and pulmonary vascularity are normal. No adenopathy. Patient is status post coronary artery bypass grafting. No pneumothorax. No bone lesions.  IMPRESSION: Status post coronary artery bypass grafting. Heart size normal. Lungs clear.   Electronically Signed   By: Lowella Grip III M.D.   On: 11/12/2019 09:04   Impression:  His incision is healing well and the sternum is stable.  He has slight thickening of the scar at the lower end of the chest incision and this is sometimes associated with some burning discomfort.  I told him I would expect this to gradually resolve.  I recommended that he could try using some vitamin E oil or Mederma on this area of the incision to decrease scarring.  I am not sure if that will resolve the discomfort but it should get better over time.  Plan:  He will return to see me as needed.   Gaye Pollack, MD [facility] [phone]

## 2019-11-18 DIAGNOSIS — I700 Atherosclerosis of aorta: Secondary | ICD-10-CM | POA: Diagnosis not present

## 2019-11-18 DIAGNOSIS — R102 Pelvic and perineal pain: Secondary | ICD-10-CM | POA: Diagnosis not present

## 2019-11-19 DIAGNOSIS — E785 Hyperlipidemia, unspecified: Secondary | ICD-10-CM | POA: Diagnosis not present

## 2019-11-20 LAB — LIPID PANEL
Chol/HDL Ratio: 3.8 ratio (ref 0.0–5.0)
Cholesterol, Total: 130 mg/dL (ref 100–199)
HDL: 34 mg/dL — ABNORMAL LOW (ref >39)
LDL Chol Calc (NIH): 76 mg/dL (ref 0–99)
Triglycerides: 106 mg/dL (ref 0–149)
VLDL Cholesterol Cal: 20 mg/dL (ref 5–40)

## 2019-11-24 MED FILL — CLOPIDOGREL 75 MG TABLET: 75 | 90 days supply | Qty: 90 | Fill #1

## 2019-11-24 MED FILL — LIALDA 1.2 GM TABLET SA: 1.2 | 30 days supply | Qty: 120 | Fill #1

## 2019-11-24 MED FILL — ESOMEPRAZOLE MAG DR 40 MG C: 40 | 90 days supply | Qty: 180 | Fill #1

## 2019-11-26 ENCOUNTER — Other Ambulatory Visit: Payer: Self-pay

## 2019-11-26 ENCOUNTER — Ambulatory Visit (INDEPENDENT_AMBULATORY_CARE_PROVIDER_SITE_OTHER): Payer: 59 | Admitting: Cardiology

## 2019-11-26 VITALS — BP 124/82 | HR 75 | Ht 71.0 in | Wt 204.0 lb

## 2019-11-26 DIAGNOSIS — Z951 Presence of aortocoronary bypass graft: Secondary | ICD-10-CM | POA: Diagnosis not present

## 2019-11-26 DIAGNOSIS — E785 Hyperlipidemia, unspecified: Secondary | ICD-10-CM | POA: Diagnosis not present

## 2019-11-26 DIAGNOSIS — I10 Essential (primary) hypertension: Secondary | ICD-10-CM

## 2019-11-26 DIAGNOSIS — Z79899 Other long term (current) drug therapy: Secondary | ICD-10-CM | POA: Diagnosis not present

## 2019-11-26 DIAGNOSIS — R002 Palpitations: Secondary | ICD-10-CM

## 2019-11-26 DIAGNOSIS — I25119 Atherosclerotic heart disease of native coronary artery with unspecified angina pectoris: Secondary | ICD-10-CM | POA: Diagnosis not present

## 2019-11-26 NOTE — Progress Notes (Signed)
Primary Care Provider: [name], MD Cardiologist: Glenetta Hew, MD Electrophysiologist: None  Clinic Note: Chief Complaint  Patient presents with   Follow-up    6-month  Coronary Artery Disease    Recent CABG   HPI:    Daniel Buck is a 54y.o. male with a PMH notable for early onset CAD (inferior STEMI 1996--BMS PCI to RCA, restenosis with atherectomy PCI in 2002) now status post CABG x4 for progression of disease who presents today for routine follow-up.  I had last seen Daniel Buck October 2019 -> was doing well at the time.  No chest pain.  Was taking Humira for ulcerative colitis.  Converted him from simvastatin to rosuvastatin 40 mg.  Daniel Buck was seen by LKerin Ransom PA on Aug 20, 2019 with complaint of exertional chest discomfort and palpitations similar to his pre PCI symptoms ([place]  Daniel Buck was set up for an OP cath 08/22/2019 --> progression of multivessel disease along with LM stenosis. On 09/04/2019 Daniel Buck underwent elective CABG x 4 with a LIMA-LAD, SVG-OM, SVG-Dx, and an SVG-RPLA.  Recent Hospitalizations:   08/22/2019 outpatient Cath--  6/10-15/2021 Staged CABG x4; uncomplicated postop course;   Initial postop visit with PA on 09/19/2019-complaint of gout flare and right-sided chest pain.  There is moderate left pleural effusion noted.  Started on colchicine and 5 days of Lasix.  Daniel Buck was last seen for initial post hospital follow-up by LKerin Ransom PLortonon September 24, 2019.  Was doing well postop.  Daniel Buck did note some fatigue, but denied any pleuritic pain.  Occasional skipped beats but no sustained tachycardia.  Was walking at least a mile twice a day.   Daniel Buck was then seen by Dr. BCyndia Bentfor postop follow-up on 10/08/2019 -> feeling well.  Walking 1/2-2 miles per day without chest pain or dyspnea.  Right sided chest wall pain was resolved.  Some gout problems but not much.  Reviewed   CV studies:    The following studies were reviewed today: (if available, images/films reviewed: From Epic Chart or Care Everywhere)  Left Heart Cath-Coronaries (08/22/2019): Eccentric ostial 40 of 50% LM followed by 55-70% distal LM.  65% midLAD (heavily calcified), ostial D1 80%, LCx 70% between OM1 and OM2, RCA large with diffuse ISR prior PCI in 1996 and 2002-up to 90%.  PDA with competitive flow due to collateral flow from LCA.  EF 50-60%.  LVEDP 17 mmHg. Diagnostic- Dominance: Right    Carotid Dopplers 09/02/2019-relative normal  CABG x4 (09/04/2019): LIMA-LAD, SVG-OM, SVG-DX, SVG-RPLA (Dr. BCyndia Bent o Intra-Op TEE: EF 50-55%.  Mild MR normal aortic, pulmonic and tricuspid valves. ->  Stable postop   Interval History:   Daniel Buck returns today for standard postop follow-up stating that Daniel Buck is doing pretty well.  Daniel Buck is not really having any significant exertional dyspnea or chest pain.  Daniel Buck says his some days are better than others as far as energy level but for the most part doing better.  The one thing Daniel Buck does note is that Daniel Buck is more irritable or easily irritable since surgery.  Daniel Buck can get quite upset very easily, but otherwise is in relatively good spirits. Daniel Buck has off-and-on palpitations or forceful beats that Daniel Buck feels mostly when lying down or after Daniel Buck eats. No more pleuritic chest pain.  Still exercising, and hoping to get back to work.  Cardiovascular Review  of Symptoms (Summary): no chest pain or dyspnea on exertion positive for - irregular heartbeat, palpitations and Notably improved fatigue.  Easy irritability. negative for - edema, orthopnea, paroxysmal nocturnal dyspnea, rapid heart rate, shortness of breath or Lightheadedness or dizziness, syncope/near syncope or TIA/amaurosis fugax, claudication  The patient does not have symptoms concerning for COVID-19 infection (fever, chills, cough, or new shortness of breath).   REVIEWED OF SYSTEMS   Review of Systems  Constitutional:  Negative for malaise/fatigue (Energy level slowly getting better) and weight loss.  HENT: Negative for congestion and nosebleeds.   Eyes: Negative for blurred vision.  Respiratory: Negative for cough, shortness of breath (Only when Daniel Buck overdoes it) and wheezing.   Cardiovascular: Negative for claudication.  Gastrointestinal: Negative for blood in stool.  Genitourinary: Negative for hematuria.  Musculoskeletal: Negative for joint pain.  Neurological: Negative for dizziness, focal weakness, weakness and headaches.  Psychiatric/Behavioral: Negative for depression. The patient does not have insomnia.        Describe himself as being easily irritable postop   I have reviewed and (if needed) personally updated the patient's problem list, medications, allergies, past medical and surgical history, social and family history.   PAST MEDICAL HISTORY   Past Medical History:  Diagnosis Date   Carpal tunnel syndrome on left 12/2011   Chronic nasal congestion    Depression 01/19/2012   GERD (gastroesophageal reflux disease)    Gout    History of Doppler ultrasound    a. Carotid US 3/11: no ICA stenosis    History of echocardiogram    a. Echo 6/14: mod LVH, EF 50-55%, inf-lat HK, Gr 1 DD, mild LAE   History of hiatal hernia    Hx of CABG 09/04/2019   LIMA-LAD, SVG-OM, SVG-Dx, and an SVG-RPLA.   Hyperlipidemia LDL goal < 70    Hypertension    under control; has been on med. since 1996   Multiple vessel coronary artery disease 1996, '02; '21   a. s/p Inf MI (age 37) >>PCI to RCA with tandem BMS (PS 1535); b. LHC 06/2000: pRCA 30-40%, prox/mid stents with 75% ISR, 30-40% at crux in RCA >> PCI: Cutting Balloon atherectomy for ISR; c. 2012 Myoview-nonischemic; d. 07/2019 -cath with progression of disease/LM disease => CABG x4   Myocardial infarct, old 1996   PCI   Sleep apnea    no testing yet   Ulcerative colitis ([place])     PAST SURGICAL HISTORY   Past Surgical History:  Procedure  Laterality Date   CARPAL TUNNEL RELEASE  01/26/2012   Procedure: CARPAL TUNNEL RELEASE;  Surgeon: Cammie Sickle., MD;  Location: [place];  Service: Orthopedics;  Laterality: Left;   CORONARY ANGIOPLASTY  07/11/2000   Cutting Balloon PTCA for RCA ISR   CORONARY ARTERY BYPASS GRAFT N/A 09/04/2019   Procedure: CORONARY ARTERY BYPASS GRAFTING (CABG) using LIMA to LAD; Endoscopic Vein Harvest of Right Greater Saphenous: SVG to Diag1; SVG to OM1; SVG to PL.;  Surgeon: Gaye Pollack, MD;  Location: [place];  Service: Open Heart Surgery;  Laterality: N/A;   CORONARY STENT PLACEMENT  1996   [id number] BMS x 2 in RCA   CYSTECTOMY  as a child   from neck   ENDOVEIN HARVEST OF GREATER SAPHENOUS VEIN Right 09/04/2019   Procedure: [place];  Surgeon: Gaye Pollack, MD;  Location: [place];  Service: Open Heart Surgery;  Laterality: Right;  Endovein scope harvest of right upper  and lower LE.   HERNIA REPAIR     LEFT HEART CATH AND CORONARY ANGIOGRAPHY N/A 08/22/2019   Procedure: LEFT HEART CATH AND CORONARY ANGIOGRAPHY;  Surgeon: Belva Crome, MD;  Location: [place] [facility];;  Eccentric ostial 40 of 50% LM followed by 55-70% distal LM.  65% midLAD (heavily calcified), ostial D1 80%, LCx 70% between OM1 and OM2, RCA large with diffuse ISR prior PCI in 1996 and 2002-up to 90%.  PDA with competitive flow due to collateral flow from LCA.  EF 50-60%.  LVEDP 17    NM MYOVIEW LTD  December 2012   Inferior infarct but no ischemia   TEE WITHOUT CARDIOVERSION N/A 09/04/2019   Procedure: INTRAOPERATIVE TRANSESOPHAGEAL ECHOCARDIOGRAM (TEE);  Surgeon: Gaye Pollack, MD;  Location: [place];  Service: Open Heart Surgery;  Intra-Op TEE: EF 50-55%.  Mild MR normal aortic, pulmonic and tricuspid valves. ->  Stable postop   TONSILLECTOMY  as a child   TRANSTHORACIC ECHOCARDIOGRAM  08/2017   Normal echo-normal LV size and function.  EF 55-60%.  No R WMA.  No ASD/PFO.   No valve lesions.    Immunization History  Administered Date(s) Administered   Influenza,inj,Quad PF,6+ Mos 01/17/2013, 04/07/2015, 03/06/2017   Pneumococcal Polysaccharide-23 01/17/2013    MEDICATIONS/ALLERGIES   Current Meds  Medication Sig   acetaminophen (TYLENOL) 325 MG tablet Take 650 mg by mouth every 6 (six) hours as needed for moderate pain.   allopurinol (ZYLOPRIM) 300 MG tablet Take 300 mg by mouth daily.   amLODipine (NORVASC) 5 MG tablet Take 1 tablet (5 mg total) by mouth daily.   aspirin EC 81 MG tablet Take 1 tablet (81 mg total) by mouth daily.   Cholecalciferol (VITAMIN D) 125 MCG (5000 UT) CAPS Take 5,000 Units by mouth daily.   clopidogrel (PLAVIX) 75 MG tablet TAKE 1 TABLET BY MOUTH DAILY. (Patient taking differently: Take 75 mg by mouth daily. )   esomeprazole (NEXIUM) 40 MG capsule Take 40 mg by mouth daily.    LIALDA 1.2 g EC tablet Take 1.2 g by mouth QID.    metoprolol tartrate (LOPRESSOR) 25 MG tablet Take 1 tablet (25 mg total) by mouth 2 (two) times daily.   rosuvastatin (CRESTOR) 40 MG tablet TAKE 1 TABLET (40 MG TOTAL) BY MOUTH DAILY.   Vilazodone HCl (VIIBRYD) 40 MG TABS Take 40 mg by mouth daily.    No Known Allergies  SOCIAL HISTORY/FAMILY HISTORY   Reviewed in Epic: No changes to social and family history.  Social History   Social History Narrative   Daniel Buck lives in [place], [place] near [place] with his wife. Daniel Buck [place].  Near the former [address] takes houses his horses.   At baseline, Daniel Buck is [place] active [place], but does not have a routine exercise regimen.   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   Daniel Buck quit smoking in 1994.    OBJCTIVE -PE, EKG, labs   Wt Readings from Last 3 Encounters:  11/26/19 204 lb (92.5 kg)  11/12/19 200 lb (90.7 kg)  10/08/19 195 lb (88.5 kg)    Physical Exam: BP 124/82    Pulse 75    Ht 5' 11"  (1.803 m)    Wt 204 lb (92.5 kg)    SpO2 97%    BMI 28.45 kg/m    Physical Exam Constitutional:      General: Daniel Buck is not in acute distress.    Appearance:  Normal appearance. Daniel Buck is normal weight. Daniel Buck is not ill-appearing.     Comments: Healthy-appearing.  Well-groomed  HENT:     Head: Normocephalic and atraumatic.  Eyes:     Extraocular Movements: Extraocular movements intact.  Neck:     Vascular: No carotid bruit, hepatojugular reflux or JVD.  Cardiovascular:     Rate and Rhythm: Normal rate and regular rhythm. Occasional extrasystoles are present.    Chest Wall: PMI is not displaced.     Pulses: Normal pulses.     Heart sounds: Normal heart sounds. No murmur heard.  No friction rub. No gallop.   Pulmonary:     Effort: Pulmonary effort is normal. No respiratory distress.  Chest:     Chest wall: Tenderness: Mild tenderness along the sternal borders.  Abdominal:     General: Abdomen is flat. Bowel sounds are normal. There is no distension.     Palpations: Abdomen is soft.     Comments: No HSM or bruit  Musculoskeletal:        General: No swelling. Normal range of motion.     Cervical back: Normal range of motion.  Neurological:     General: No focal deficit present.     Mental Status: Daniel Buck is alert and oriented to person, place, and time.  Psychiatric:        Mood and Affect: Mood normal.        Behavior: Behavior normal.        Thought Content: Thought content normal.        Judgment: Judgment normal.     Comments: Did not seem all that irritable to me     Adult ECG Report Not checked  Recent Labs:    Lab Results  Component Value Date   CHOL 130 11/19/2019   HDL 34 (L) 11/19/2019   LDLCALC 76 11/19/2019   TRIG 106 11/19/2019   CHOLHDL 3.8 11/19/2019   Lab Results  Component Value Date   CREATININE 1.14 09/09/2019   BUN 21 (H) 09/09/2019   NA 139 09/09/2019   K 4.0 09/09/2019   CL 101 09/09/2019   CO2 28 09/09/2019   Lab Results  Component Value Date   TSH 2.652 06/14/2016    ASSESSMENT/PLAN    Problem List Items  Addressed This Visit    Essential hypertension, benign (Chronic)    Stable blood pressures.  Daniel Buck is on amlodipine and low-dose metoprolol.  Metoprolol was restarted because of palpitations and CAD.      Hyperlipidemia LDL goal <70 (Chronic)    Last labs from August showed LDL of 76.  Is on rosuvastatin 40 mg daily.  Should be due for follow-up labs in January timeframe.  We can reassess to see if Daniel Buck potentially needs additional therapy.      Relevant Orders   Lipid panel   Heart palpitations (Chronic)    Daniel Buck is now describing these pounding sensations of irregular heartbeats.  Sounds like it could be PVCs.  It is somewhat worsened him.  Plan: 3-day Zio patch monitor  Continue beta-blocker.  May we will titrate up further depending on blood pressure.      Relevant Orders   LONG TERM MONITOR (3-14 DAYS)   Coronary artery disease involving native coronary artery of native heart with angina pectoris (HCC) - Primary (Chronic)    Multivessel CAD including Left Main disease.  Now 3 months post CABG with no major complaints besides some mild irritability.  Plan:  Currently on aspirin plus  Plavix--we will discontinued aspirin as of November  Continue current doses of amlodipine and metoprolol tartrate.  Continue simvastatin.  Continue to progressively increase of his activity.      S/P CABG x 4    No further angina.  Has little fatigue but that is improving.  Little bit concerning that Daniel Buck is having some personality differences.  Hopefully this will also improve as time goes by.  Should be okay to go back to work now.       Other Visit Diagnoses    Medication management       Relevant Orders   Lipid panel   Hepatic function panel      COVID-19 Education: The signs and symptoms of COVID-19 were discussed with the patient and how to seek care for testing (follow up with PCP or arrange E-visit).   The importance of social distancing and COVID-19 vaccination was discussed today.  1 min The patient is practicing social distancing & Masking.   I spent a total of 19mnutes with the patient spent in direct patient consultation.  Additional time spent with chart review  / charting (studies, outside notes, etc): 24 Total Time: 564m   Current medicines are reviewed at length with the patient today.  (+/- concerns) n/a  Notice: This dictation was prepared with Dragon dictation along with smaller phrase technology. Any transcriptional errors that result from this process are unintentional and may not be corrected upon review.  Patient Instructions / Medication Changes & Studies & Tests Ordered   Patient Instructions  Medication Instructions:  Continue current medications  *If you need a refill on your cardiac medications before your next appointment, please call your pharmacy*   Lab Work: Fasting Lipid and Liver in January   Testing/Procedures: Your physician has recommended that you wear a 3 days Zio monitor. Holter monitors are medical devices that record the hearts electrical activity. Doctors most often use these monitors to diagnose arrhythmias. Arrhythmias are problems with the speed or rhythm of the heartbeat. The monitor is a small, portable device. You can wear one while you do your normal daily activities. This is usually used to diagnose what is causing palpitations/syncope (passing out).   Follow-Up: At [facility] and your health needs are our priority.  As part of our continuing mission to provide you with exceptional heart care, we have created designated Provider Care Teams.  These Care Teams include your primary Cardiologist (physician) and Advanced Practice Providers (APPs -  Physician Assistants and Nurse Practitioners) who all work together to provide you with the care you need, when you need it.  We recommend signing up for the patient portal called "MyChart".  Sign up information is provided on this After Visit Summary.  MyChart is used  to connect with patients for Virtual Visits (Telemedicine).  Patients are able to view lab/test results, encounter notes, upcoming appointments, etc.  Non-urgent messages can be sent to your provider as well.   To learn more about what you can do with MyChart, go to [email or web address]   Your next appointment:   6 month(s)  The format for your next appointment:   In Person  Provider:   You may see DaGlenetta HewMD or one of the following Advanced Practice Providers on your designated Care Team:    RhRosaria FerriesPA-C  KaJory SimsDNP, ANP  Cadence FuKathlen ModyPA-C        Studies Ordered:   Orders Placed This Encounter  Procedures   Lipid  panel   Hepatic function panel   LONG TERM MONITOR (3-14 DAYS)     Glenetta Hew, M.D., M.S. Interventional Cardiologist   Pager # [phone] Phone # [phone] [address] [place], [place] [address]   Thank you for choosing Heartcare at [facility]!!

## 2019-11-26 NOTE — Patient Instructions (Signed)
Medication Instructions:  Continue current medications  *If you need a refill on your cardiac medications before your next appointment, please call your pharmacy*   Lab Work: Fasting Lipid and Liver in January   Testing/Procedures: Your physician has recommended that you wear a 3 days Zio monitor. Holter monitors are medical devices that record the heart's electrical activity. Doctors most often use these monitors to diagnose arrhythmias. Arrhythmias are problems with the speed or rhythm of the heartbeat. The monitor is a small, portable device. You can wear one while you do your normal daily activities. This is usually used to diagnose what is causing palpitations/syncope (passing out).   Follow-Up: At [facility], you and your health needs are our priority.  As part of our continuing mission to provide you with exceptional heart care, we have created designated Provider Care Teams.  These Care Teams include your primary Cardiologist (physician) and Advanced Practice Providers (APPs -  Physician Assistants and Nurse Practitioners) who all work together to provide you with the care you need, when you need it.  We recommend signing up for the patient portal called "MyChart".  Sign up information is provided on this After Visit Summary.  MyChart is used to connect with patients for Virtual Visits (Telemedicine).  Patients are able to view lab/test results, encounter notes, upcoming appointments, etc.  Non-urgent messages can be sent to your provider as well.   To learn more about what you can do with MyChart, go to [email or web address].    Your next appointment:   6 month(s)  The format for your next appointment:   In Person  Provider:   You may see Glenetta Hew, MD or one of the following Advanced Practice Providers on your designated Care Team:    Rosaria Ferries, PA-C  Jory Sims, DNP, ANP  Cadence Kathlen Mody, PA-C

## 2019-12-03 ENCOUNTER — Encounter: Payer: Self-pay | Admitting: Cardiology

## 2019-12-03 NOTE — Assessment & Plan Note (Signed)
Last labs from August showed LDL of 76.  Is on rosuvastatin 40 mg daily.  Should be due for follow-up labs in January timeframe.  We can reassess to see if he potentially needs additional therapy.

## 2019-12-03 NOTE — Assessment & Plan Note (Signed)
Stable blood pressures.  He is on amlodipine and low-dose metoprolol.  Metoprolol was restarted because of palpitations and CAD.

## 2019-12-03 NOTE — Assessment & Plan Note (Signed)
Multivessel CAD including Left Main disease.  Now 3 months post CABG with no major complaints besides some mild irritability.  Plan:  Currently on aspirin plus Plavix--we will discontinued aspirin as of November  Continue current doses of amlodipine and metoprolol tartrate.  Continue simvastatin.  Continue to progressively increase of his activity.

## 2019-12-03 NOTE — Assessment & Plan Note (Signed)
No further angina.  Has little fatigue but that is improving.  Little bit concerning that he is having some personality differences.  Hopefully this will also improve as time goes by.  Should be okay to go back to work now.

## 2019-12-03 NOTE — Assessment & Plan Note (Signed)
He is now describing these pounding sensations of irregular heartbeats.  Sounds like it could be PVCs.  It is somewhat worsened him.  Plan: 3-day Zio patch monitor  Continue beta-blocker.  May we will titrate up further depending on blood pressure.

## 2019-12-06 ENCOUNTER — Ambulatory Visit (INDEPENDENT_AMBULATORY_CARE_PROVIDER_SITE_OTHER): Payer: 59

## 2019-12-06 DIAGNOSIS — R002 Palpitations: Secondary | ICD-10-CM | POA: Diagnosis not present

## 2019-12-08 ENCOUNTER — Other Ambulatory Visit: Payer: Self-pay

## 2019-12-08 ENCOUNTER — Other Ambulatory Visit: Payer: Self-pay | Admitting: Surgery

## 2019-12-08 ENCOUNTER — Telehealth: Payer: Self-pay | Admitting: Cardiology

## 2019-12-08 MED ORDER — METOPROLOL TARTRATE 25 MG PO TABS
25.0000 mg | ORAL_TABLET | Freq: Two times a day (BID) | ORAL | 3 refills | Status: DC
Start: 2019-12-08 — End: 2019-12-29

## 2019-12-08 MED FILL — METOPROLOL TARTRATE 25 MG T: 25 | 30 days supply | Qty: 60 | Fill #1

## 2019-12-08 MED FILL — AMLODIPINE BESYLATE 5 MG TA: 5 | 90 days supply | Qty: 90 | Fill #1

## 2019-12-08 MED FILL — ALLOPURINOL 300 MG TABS: 300 | 30 days supply | Qty: 30 | Fill #1

## 2019-12-08 NOTE — Telephone Encounter (Signed)
Pt c/o medication issue:  1. Name of Medication: metoprolol tartrate (LOPRESSOR) 25 MG tablet  2. How are you currently taking this medication (dosage and times per day)? As written  3. Are you having a reaction (difficulty breathing--STAT)? No  4. What is your medication issue? Pt needs a new prescription

## 2019-12-08 NOTE — Telephone Encounter (Signed)
Attempted to contact pt to make aware that medication had been refilled. Unable to leave voicemail due to mailbox being full. Will send mychart message.

## 2019-12-12 NOTE — Telephone Encounter (Signed)
Confirmed with Erin at [facility] outpatient pharmacy that the prescription was received and was dispensed to the patient 9/13. Confirmed he has refills available as well. Roderic Ovens for her assistance

## 2019-12-17 DIAGNOSIS — R002 Palpitations: Secondary | ICD-10-CM | POA: Diagnosis not present

## 2019-12-18 DIAGNOSIS — R002 Palpitations: Secondary | ICD-10-CM

## 2019-12-29 ENCOUNTER — Other Ambulatory Visit: Payer: Self-pay | Admitting: Cardiology

## 2019-12-29 MED ORDER — AMLODIPINE BESYLATE 5 MG PO TABS
2.5000 mg | ORAL_TABLET | Freq: Every day | ORAL | 3 refills | Status: DC
Start: 2019-12-29 — End: 2020-06-28

## 2019-12-29 MED ORDER — METOPROLOL TARTRATE 50 MG PO TABS
50.0000 mg | ORAL_TABLET | Freq: Two times a day (BID) | ORAL | 3 refills | Status: DC
Start: 2019-12-29 — End: 2019-12-29

## 2019-12-29 MED FILL — METOPROLOL TARTRATE 50 MG T: 50 | 90 days supply | Qty: 180 | Fill #0

## 2019-12-29 MED FILL — VIIBRYD 40 MG TABLET: 40 | 90 days supply | Qty: 90 | Fill #2

## 2019-12-29 NOTE — Addendum Note (Signed)
Addended by: Sherrie Mustache on: 12/29/2019 04:21 PM   Modules accepted: Orders

## 2019-12-31 ENCOUNTER — Telehealth: Payer: Self-pay | Admitting: Cardiology

## 2019-12-31 NOTE — Telephone Encounter (Signed)
1. What dental office are you calling from? Dr. Thurnell Lose  2. What is your office phone number? [phone]  3. What is your fax number? [phone]  4. What type of procedure is the patient having performed? crown  5. What date is procedure scheduled or is the patient there now? 01/06/2020 (if the patient is at the dentist's office question goes to their cardiologist if he/she is in the office.  If not, question should go to the DOD).   6. What is your question (ex. Antibiotics prior to procedure, holding medication-we need to know how long dentist wants pt to hold med)? Requesting to know if the patient is released for dental work at this time and if any antibiotics are needed prior.

## 2019-12-31 NOTE — Telephone Encounter (Signed)
   Primary Cardiologist: Glenetta Hew, MD  Chart reviewed as part of pre-operative protocol coverage.   Simple dental extractions and procedures are considered low risk procedures per guidelines and generally do not require any specific cardiac clearance. It is also generally accepted that for simple extractions and dental cleanings, there is no need to interrupt blood thinner therapy.   SBE prophylaxis is not required for the patient from a cardiac standpoint.  I will route this recommendation to the requesting party via Epic fax function and remove from pre-op pool.  Please call with questions.  Tami Lin Julez Huseby, PA 12/31/2019, 2:48 PM

## 2020-01-05 MED FILL — ALLOPURINOL 300 MG TABS: 300 | 30 days supply | Qty: 30 | Fill #2

## 2020-01-08 DIAGNOSIS — E291 Testicular hypofunction: Secondary | ICD-10-CM | POA: Diagnosis not present

## 2020-01-08 DIAGNOSIS — E559 Vitamin D deficiency, unspecified: Secondary | ICD-10-CM | POA: Diagnosis not present

## 2020-01-08 DIAGNOSIS — M109 Gout, unspecified: Secondary | ICD-10-CM | POA: Diagnosis not present

## 2020-01-08 DIAGNOSIS — R5383 Other fatigue: Secondary | ICD-10-CM | POA: Diagnosis not present

## 2020-01-12 MED FILL — ROSUVASTATIN CALCIUM 40 MG: 40 | 90 days supply | Qty: 90 | Fill #2

## 2020-01-16 ENCOUNTER — Other Ambulatory Visit (HOSPITAL_COMMUNITY): Payer: Self-pay | Admitting: Family Medicine

## 2020-01-29 DIAGNOSIS — H3562 Retinal hemorrhage, left eye: Secondary | ICD-10-CM | POA: Diagnosis not present

## 2020-02-06 ENCOUNTER — Telehealth: Payer: Self-pay | Admitting: Radiology

## 2020-02-06 NOTE — Telephone Encounter (Signed)
Enrolled patient for a 7 day Zio XT monitor to be mailed to patients home.  

## 2020-02-06 NOTE — Addendum Note (Signed)
Addended by: Rexanne Mano B on: 02/06/2020 07:30 AM   Modules accepted: Orders

## 2020-02-09 DIAGNOSIS — G5601 Carpal tunnel syndrome, right upper limb: Secondary | ICD-10-CM | POA: Diagnosis not present

## 2020-02-09 MED FILL — ALLOPURINOL 300 MG TABS: 300 | 90 days supply | Qty: 90 | Fill #0

## 2020-02-10 DIAGNOSIS — M79672 Pain in left foot: Secondary | ICD-10-CM | POA: Diagnosis not present

## 2020-02-10 DIAGNOSIS — H2513 Age-related nuclear cataract, bilateral: Secondary | ICD-10-CM | POA: Diagnosis not present

## 2020-02-10 DIAGNOSIS — H35033 Hypertensive retinopathy, bilateral: Secondary | ICD-10-CM | POA: Diagnosis not present

## 2020-02-16 DIAGNOSIS — B370 Candidal stomatitis: Secondary | ICD-10-CM | POA: Diagnosis not present

## 2020-02-25 HISTORY — PX: OTHER SURGICAL HISTORY: SHX169

## 2020-02-25 MED FILL — LIALDA 1.2 GM TABLET SA: 1.2 | 90 days supply | Qty: 360 | Fill #2

## 2020-02-25 MED FILL — CLOPIDOGREL 75 MG TABLET: 75 | 90 days supply | Qty: 90 | Fill #2

## 2020-02-26 ENCOUNTER — Other Ambulatory Visit (HOSPITAL_COMMUNITY): Payer: Self-pay | Admitting: Gastroenterology

## 2020-02-26 MED FILL — ESOMEPRAZOLE MAG DR 40 MG C: 40 | 90 days supply | Qty: 180 | Fill #0

## 2020-03-08 ENCOUNTER — Other Ambulatory Visit (HOSPITAL_COMMUNITY): Payer: Self-pay | Admitting: Sports Medicine

## 2020-03-08 ENCOUNTER — Other Ambulatory Visit: Payer: Self-pay | Admitting: Sports Medicine

## 2020-03-08 DIAGNOSIS — M79672 Pain in left foot: Secondary | ICD-10-CM

## 2020-03-08 MED FILL — AMLODIPINE BESYLATE 5 MG TA: 5 | 90 days supply | Qty: 90 | Fill #2

## 2020-03-09 ENCOUNTER — Ambulatory Visit (INDEPENDENT_AMBULATORY_CARE_PROVIDER_SITE_OTHER): Payer: 59

## 2020-03-09 DIAGNOSIS — H2513 Age-related nuclear cataract, bilateral: Secondary | ICD-10-CM | POA: Diagnosis not present

## 2020-03-09 DIAGNOSIS — H35033 Hypertensive retinopathy, bilateral: Secondary | ICD-10-CM | POA: Diagnosis not present

## 2020-03-09 DIAGNOSIS — R002 Palpitations: Secondary | ICD-10-CM | POA: Diagnosis not present

## 2020-03-09 DIAGNOSIS — G453 Amaurosis fugax: Secondary | ICD-10-CM

## 2020-03-12 NOTE — Telephone Encounter (Signed)
Daniel Man, MD  You 13 hours ago (6:28 PM)     Ok TO CHECK CAROTID DOPPLER   CAN CALL IT AMAUROSIS FUGAX   Glenetta Hew, MD

## 2020-03-15 ENCOUNTER — Ambulatory Visit (HOSPITAL_COMMUNITY)
Admission: RE | Admit: 2020-03-15 | Discharge: 2020-03-15 | Disposition: A | Discharge status: Home / Self Care | Payer: 59 | Source: Ambulatory Visit | Attending: Cardiovascular Disease | Admitting: Cardiovascular Disease

## 2020-03-15 ENCOUNTER — Other Ambulatory Visit: Payer: Self-pay

## 2020-03-15 DIAGNOSIS — G453 Amaurosis fugax: Secondary | ICD-10-CM | POA: Diagnosis not present

## 2020-03-17 MED FILL — AMLODIPINE BESYLATE 5 MG TA: 5 | 90 days supply | Qty: 90 | Fill #2

## 2020-03-22 ENCOUNTER — Other Ambulatory Visit: Payer: Self-pay

## 2020-03-22 ENCOUNTER — Ambulatory Visit (HOSPITAL_COMMUNITY)
Admission: RE | Admit: 2020-03-22 | Discharge: 2020-03-22 | Disposition: A | Discharge status: Home / Self Care | Payer: 59 | Source: Ambulatory Visit | Attending: Sports Medicine | Admitting: Sports Medicine

## 2020-03-22 DIAGNOSIS — M79675 Pain in left toe(s): Secondary | ICD-10-CM | POA: Diagnosis not present

## 2020-03-22 DIAGNOSIS — M79672 Pain in left foot: Secondary | ICD-10-CM | POA: Insufficient documentation

## 2020-03-22 DIAGNOSIS — M7989 Other specified soft tissue disorders: Secondary | ICD-10-CM | POA: Diagnosis not present

## 2020-03-22 DIAGNOSIS — S83412A Sprain of medial collateral ligament of left knee, initial encounter: Secondary | ICD-10-CM | POA: Diagnosis not present

## 2020-03-23 ENCOUNTER — Other Ambulatory Visit: Payer: 59

## 2020-03-23 DIAGNOSIS — H35361 Drusen (degenerative) of macula, right eye: Secondary | ICD-10-CM | POA: Diagnosis not present

## 2020-03-23 DIAGNOSIS — H35062 Retinal vasculitis, left eye: Secondary | ICD-10-CM

## 2020-03-23 DIAGNOSIS — R002 Palpitations: Secondary | ICD-10-CM | POA: Diagnosis not present

## 2020-03-23 DIAGNOSIS — H43823 Vitreomacular adhesion, bilateral: Secondary | ICD-10-CM | POA: Diagnosis not present

## 2020-03-23 DIAGNOSIS — H35033 Hypertensive retinopathy, bilateral: Secondary | ICD-10-CM | POA: Diagnosis not present

## 2020-03-24 ENCOUNTER — Encounter (HOSPITAL_COMMUNITY): Payer: 59

## 2020-03-24 ENCOUNTER — Other Ambulatory Visit (HOSPITAL_COMMUNITY): Payer: Self-pay | Admitting: Family Medicine

## 2020-03-24 ENCOUNTER — Other Ambulatory Visit (HOSPITAL_COMMUNITY): Payer: Self-pay | Admitting: Ophthalmology

## 2020-03-24 DIAGNOSIS — H35062 Retinal vasculitis, left eye: Secondary | ICD-10-CM

## 2020-03-24 DIAGNOSIS — M316 Other giant cell arteritis: Secondary | ICD-10-CM

## 2020-03-24 LAB — CBC WITH DIFFERENTIAL/PLATELET
Basophils Absolute: 0.1 10*3/uL (ref 0.0–0.2)
Basos: 2 %
EOS (ABSOLUTE): 0.6 10*3/uL — ABNORMAL HIGH (ref 0.0–0.4)
Eos: 6 %
Hematocrit: 46.3 % (ref 37.5–51.0)
Hemoglobin: 14.3 g/dL (ref 13.0–17.7)
Immature Grans (Abs): 0 10*3/uL (ref 0.0–0.1)
Immature Granulocytes: 0 %
Lymphocytes Absolute: 2.2 10*3/uL (ref 0.7–3.1)
Lymphs: 24 %
MCH: 24.2 pg — ABNORMAL LOW (ref 26.6–33.0)
MCHC: 30.9 g/dL — ABNORMAL LOW (ref 31.5–35.7)
MCV: 79 fL (ref 79–97)
Monocytes Absolute: 0.9 10*3/uL (ref 0.1–0.9)
Monocytes: 10 %
Neutrophils Absolute: 5.1 10*3/uL (ref 1.4–7.0)
Neutrophils: 58 %
Platelets: 306 10*3/uL (ref 150–450)
RBC: 5.9 x10E6/uL — ABNORMAL HIGH (ref 4.14–5.80)
RDW: 14.6 % (ref 11.6–15.4)
WBC: 8.9 10*3/uL (ref 3.4–10.8)

## 2020-03-24 LAB — C-REACTIVE PROTEIN: CRP: 1 mg/L (ref 0–10)

## 2020-03-24 LAB — SEDIMENTATION RATE: Sed Rate: 11 mm/hr (ref 0–30)

## 2020-03-24 MED FILL — METOPROLOL TARTRATE 50 MG T: 50 | 90 days supply | Qty: 180 | Fill #1

## 2020-03-24 MED FILL — NYSTATIN 100000 UNIT/ML SUS: 100000 | 10 days supply | Qty: 160 | Fill #0

## 2020-03-24 MED FILL — VIIBRYD 40 MG TABLET: 40 | 90 days supply | Qty: 90 | Fill #0

## 2020-03-27 HISTORY — PX: OTHER SURGICAL HISTORY: SHX169

## 2020-03-29 ENCOUNTER — Other Ambulatory Visit (HOSPITAL_COMMUNITY): Payer: Self-pay | Admitting: Family Medicine

## 2020-03-30 MED FILL — MOMETASONE FUROATE 0.1% CRM: 0.1 | 21 days supply | Qty: 30 | Fill #0

## 2020-04-02 ENCOUNTER — Other Ambulatory Visit (HOSPITAL_COMMUNITY): Payer: Self-pay | Admitting: Sports Medicine

## 2020-04-02 MED FILL — predniSONE 10 MG TABS: 10 | 6 days supply | Qty: 21 | Fill #0

## 2020-04-06 ENCOUNTER — Other Ambulatory Visit (HOSPITAL_COMMUNITY): Payer: Self-pay | Admitting: Surgery

## 2020-04-06 DIAGNOSIS — R1032 Left lower quadrant pain: Secondary | ICD-10-CM | POA: Diagnosis not present

## 2020-04-06 DIAGNOSIS — R1031 Right lower quadrant pain: Secondary | ICD-10-CM | POA: Diagnosis not present

## 2020-04-06 MED FILL — GABAPENTIN 300 MG CAPSULE: 300 | 21 days supply | Qty: 63 | Fill #0

## 2020-04-06 MED FILL — ROSUVASTATIN CALCIUM 40 MG: 40 | 90 days supply | Qty: 90 | Fill #3

## 2020-04-08 ENCOUNTER — Ambulatory Visit (HOSPITAL_COMMUNITY)
Admission: RE | Admit: 2020-04-08 | Discharge: 2020-04-08 | Disposition: A | Discharge status: Home / Self Care | Payer: 59 | Source: Ambulatory Visit | Attending: Ophthalmology | Admitting: Ophthalmology

## 2020-04-08 ENCOUNTER — Other Ambulatory Visit: Payer: Self-pay

## 2020-04-08 ENCOUNTER — Encounter (HOSPITAL_COMMUNITY): Payer: Self-pay

## 2020-04-08 ENCOUNTER — Ambulatory Visit (HOSPITAL_COMMUNITY): Payer: 59

## 2020-04-08 DIAGNOSIS — H35062 Retinal vasculitis, left eye: Secondary | ICD-10-CM

## 2020-04-08 DIAGNOSIS — M316 Other giant cell arteritis: Secondary | ICD-10-CM | POA: Diagnosis not present

## 2020-04-08 DIAGNOSIS — I6521 Occlusion and stenosis of right carotid artery: Secondary | ICD-10-CM | POA: Diagnosis not present

## 2020-04-08 DIAGNOSIS — Z9889 Other specified postprocedural states: Secondary | ICD-10-CM | POA: Diagnosis not present

## 2020-04-08 DIAGNOSIS — Z951 Presence of aortocoronary bypass graft: Secondary | ICD-10-CM | POA: Diagnosis not present

## 2020-04-08 LAB — POCT I-STAT CREATININE: Creatinine, Ser: 1.3 mg/dL — ABNORMAL HIGH (ref 0.61–1.24)

## 2020-04-08 MED ORDER — IOHEXOL 350 MG/ML SOLN
100.0000 mL | Freq: Once | INTRAVENOUS | Status: AC | PRN
  Administered 2020-04-08: 100 mL via INTRAVENOUS

## 2020-04-20 DIAGNOSIS — H35033 Hypertensive retinopathy, bilateral: Secondary | ICD-10-CM | POA: Diagnosis not present

## 2020-04-20 DIAGNOSIS — H35062 Retinal vasculitis, left eye: Secondary | ICD-10-CM | POA: Diagnosis not present

## 2020-04-23 ENCOUNTER — Other Ambulatory Visit: Payer: Self-pay | Admitting: Cardiology

## 2020-04-23 DIAGNOSIS — E034 Atrophy of thyroid (acquired): Secondary | ICD-10-CM | POA: Diagnosis not present

## 2020-04-23 DIAGNOSIS — E785 Hyperlipidemia, unspecified: Secondary | ICD-10-CM | POA: Diagnosis not present

## 2020-04-23 DIAGNOSIS — E291 Testicular hypofunction: Secondary | ICD-10-CM | POA: Diagnosis not present

## 2020-04-23 DIAGNOSIS — R5383 Other fatigue: Secondary | ICD-10-CM | POA: Diagnosis not present

## 2020-04-23 DIAGNOSIS — E559 Vitamin D deficiency, unspecified: Secondary | ICD-10-CM | POA: Diagnosis not present

## 2020-04-23 DIAGNOSIS — Z79899 Other long term (current) drug therapy: Secondary | ICD-10-CM | POA: Diagnosis not present

## 2020-04-24 LAB — HEPATIC FUNCTION PANEL
AG Ratio: 1.8 (calc) (ref 1.0–2.5)
ALT: 31 U/L (ref 9–46)
AST: 42 U/L — ABNORMAL HIGH (ref 10–35)
Albumin: 4.4 g/dL (ref 3.6–5.1)
Alkaline phosphatase (APISO): 87 U/L (ref 35–144)
Bilirubin, Direct: 0.1 mg/dL (ref 0.0–0.2)
Globulin: 2.5 g/dL (calc) (ref 1.9–3.7)
Indirect Bilirubin: 0.3 mg/dL (calc) (ref 0.2–1.2)
Total Bilirubin: 0.4 mg/dL (ref 0.2–1.2)
Total Protein: 6.9 g/dL (ref 6.1–8.1)

## 2020-04-24 LAB — LIPID PANEL
Cholesterol: 139 mg/dL (ref <200)
HDL: 31 mg/dL — ABNORMAL LOW (ref >=40)
LDL Cholesterol (Calc): 86 mg/dL (calc)
Non-HDL Cholesterol (Calc): 108 mg/dL (calc) (ref <130)
Total CHOL/HDL Ratio: 4.5 (calc) (ref <5.0)
Triglycerides: 128 mg/dL (ref <150)

## 2020-04-30 ENCOUNTER — Telehealth: Payer: Self-pay | Admitting: *Deleted

## 2020-04-30 ENCOUNTER — Other Ambulatory Visit: Payer: Self-pay | Admitting: Cardiology

## 2020-04-30 DIAGNOSIS — E785 Hyperlipidemia, unspecified: Secondary | ICD-10-CM

## 2020-04-30 DIAGNOSIS — I25119 Atherosclerotic heart disease of native coronary artery with unspecified angina pectoris: Secondary | ICD-10-CM

## 2020-04-30 MED ORDER — EZETIMIBE 10 MG PO TABS: 10.0000 mg | ORAL_TABLET | Freq: Every day | ORAL | 3 refills | Status: DC

## 2020-04-30 MED FILL — EZETIMIBE 10 MG TABS: 10 | 90 days supply | Qty: 90 | Fill #0

## 2020-04-30 NOTE — Telephone Encounter (Signed)
Left detail message with detail instruction from lab results  Sending prescription to pharmacy and mailing lab slip for labs in May 2022

## 2020-04-30 NOTE — Telephone Encounter (Signed)
-----   Message from [name], MD sent at 04/26/2020  9:37 PM EST ----- Cholesterol levels look okay, but LDL actually went up to 86 from 76.  Our goal is less than 70.  Liver function tests are normal. This is probably related to diet-we can send information about heart healthy diet.  I would like to add ezetimibe (Zetia) 10 mg daily and reassess in another 3 months.  Glenetta Hew, MD

## 2020-05-04 MED FILL — ALLOPURINOL 300 MG TABS: 300 | 60 days supply | Qty: 60 | Fill #1

## 2020-05-10 DIAGNOSIS — G5601 Carpal tunnel syndrome, right upper limb: Secondary | ICD-10-CM | POA: Diagnosis not present

## 2020-05-13 NOTE — Progress Notes (Unsigned)
Office Visit Note  Patient: Daniel Buck             Date of Birth: Oct 30, 1965           MRN: [id number]             PCP: [name], MD Referring: [name], MD Visit Date: 05/14/2020 Occupation: Theatre manager  Subjective:  New Patient (Initial Visit) (Patient is here for management of gout. Per patient symptoms seem well controlled at this time. )   History of Present Illness: Daniel Buck is a 55 y.o. male with history of UC, CAD, HTN, HLD here for evaluation of joint pains and gout currently using allopurinol 3107m daily and colchicine PRN. He has a history of gout since about 10 years ago.  This is most often affected the right great toe but his most recent attack was in the left great toe.  He takes allopurinol 300 mg daily currently.  He has taken colchicine intermittently in the past not sure when last used it.  About 2 to 3 months ago had a prolonged episode of left great toe pain and swelling this responded to steroid Dosepak although symptoms returned and persisted for weeks after stopping the steroids.  This did eventually improve and currently he is having no joint complaints today.  He denies any nodules or rashes.  He is never on fluid medication for his heart disease.  Labs reviewed 09/2019 Uric acid 7.5  05/2019 eGFR 71  Imaging reviewed 03/2020 MR left foot 1st MTP periarticular erosion, tendon sprain  Activities of Daily Living:  Patient reports morning stiffness for 0 minutes.   Patient Denies nocturnal pain.  Difficulty dressing/grooming: Denies Difficulty climbing stairs: Denies Difficulty getting out of chair: Denies Difficulty using hands for taps, buttons, cutlery, and/or writing: Reports  Review of Systems  Constitutional: Negative for fatigue.  HENT: Negative for mouth sores, mouth dryness and nose dryness.   Eyes: Positive for visual disturbance. Negative for pain, itching and dryness.  Respiratory: Negative for cough, hemoptysis,  shortness of breath and difficulty breathing.   Cardiovascular: Negative for chest pain, palpitations and swelling in legs/feet.  Gastrointestinal: Negative for abdominal pain, blood in stool, constipation and diarrhea.  Endocrine: Negative for increased urination.  Genitourinary: Negative for painful urination.  Musculoskeletal: Negative for arthralgias, joint pain, joint swelling, myalgias, muscle weakness, morning stiffness, muscle tenderness and myalgias.  Skin: Negative for color change, rash and redness.  Allergic/Immunologic: Negative for susceptible to infections.  Neurological: Negative for dizziness, numbness, headaches, memory loss and weakness.  Hematological: Negative for swollen glands.  Psychiatric/Behavioral: Negative for confusion and sleep disturbance.    PMFS History:  Patient Active Problem List   Diagnosis Date Noted  . Idiopathic gout of multiple sites 05/14/2020  . Coronary artery disease involving native coronary artery of native heart with angina pectoris (HTyronza 11/26/2019  . S/P CABG x 4 09/04/2019  . Pre-operative clearance 05/06/2019  . Tear of triangular fibrocartilage 03/12/2018  . GERD (gastroesophageal reflux disease) 10/04/2016  . Cervical spondylosis 10/04/2016  . Orthostatic dizziness 01/20/2016  . Memory loss 01/19/2016  . Carpal tunnel syndrome, right 12/15/2015  . Carpal tunnel syndrome on right 01/20/2015  . Osteoarthritis of left wrist 01/20/2015  . Heart palpitations 02/06/2014  . Myocardial infarct, old   . CAD S/P percutaneous coronary angioplasty   . Hyperlipidemia LDL goal <70   . Essential hypertension, benign 02/02/2011  . Ulcerative colitis ([place] 02/02/2011    Past Medical History:  Diagnosis Date  . Carpal tunnel syndrome on left 12/2011  . Chronic nasal congestion   . Depression 01/19/2012  . GERD (gastroesophageal reflux disease)   . Gout   . History of Doppler ultrasound    a. Carotid US 3/11: no ICA stenosis   . History  of echocardiogram    a. Echo 6/14: mod LVH, EF 50-55%, inf-lat HK, Gr 1 DD, mild LAE  . History of hiatal hernia   . Hx of CABG 09/04/2019   LIMA-LAD, SVG-OM, SVG-Dx, and an SVG-RPLA.  [name] Hyperlipidemia LDL goal < 70   . Hypertension    under control; has been on med. since 1996  . Multiple vessel coronary artery disease 1996, '02; '21   a. s/p Inf MI (age 28) >>PCI to RCA with tandem BMS (PS 1535); b. LHC 06/2000: pRCA 30-40%, prox/mid stents with 75% ISR, 30-40% at crux in RCA >> PCI: Cutting Balloon atherectomy for ISR; c. 2012 Myoview-nonischemic; d. 07/2019 -cath with progression of disease/LM disease => CABG x4  . Myocardial infarct, old 1996   PCI  . Sleep apnea    no testing yet  . Ulcerative colitis ([place])     Family History  Problem Relation Age of Onset  . AAA (abdominal aortic aneurysm) Father        Died from ruptured AAA despite surgery  . CAD Father 64  . Atrial fibrillation Father   . Aortic stenosis Mother        Status post aVR   Past Surgical History:  Procedure Laterality Date  . CARPAL TUNNEL RELEASE  01/26/2012   Procedure: CARPAL TUNNEL RELEASE;  Surgeon: Cammie Sickle., MD;  Location: [facility];  Service: Orthopedics;  Laterality: Left;  . CORONARY ANGIOPLASTY  07/11/2000   Cutting Balloon PTCA for RCA ISR  . CORONARY ARTERY BYPASS GRAFT N/A 09/04/2019   Procedure: CORONARY ARTERY BYPASS GRAFTING (CABG) using LIMA to LAD; Endoscopic Vein Harvest of Right Greater Saphenous: SVG to Diag1; SVG to OM1; SVG to PL.;  Surgeon: Gaye Pollack, MD;  Location: [facility];  Service: Open Heart Surgery;  Laterality: N/A;  . CORONARY STENT PLACEMENT  1996   [id number] BMS x 2 in RCA  . CYSTECTOMY  as a child   from neck  . ENDOVEIN HARVEST OF GREATER SAPHENOUS VEIN Right 09/04/2019   Procedure: ENDOVEIN HARVEST OF GREATER SAPHENOUS VEIN;  Surgeon: Gaye Pollack, MD;  Location: [facility] [place];  Service: Open Heart Surgery;  Laterality: Right;  Endovein scope harvest of  right upper and lower LE.  [name] HERNIA REPAIR    . LEFT HEART CATH AND CORONARY ANGIOGRAPHY N/A 08/22/2019   Procedure: LEFT HEART CATH AND CORONARY ANGIOGRAPHY;  Surgeon: Belva Crome, MD;  Location: [facility];;  Eccentric ostial 40 of 50% LM followed by 55-70% distal LM.  65% midLAD (heavily calcified), ostial D1 80%, LCx 70% between OM1 and OM2, RCA large with diffuse ISR prior PCI in 1996 and 2002-up to 90%.  PDA with competitive flow due to collateral flow from LCA.  EF 50-60%.  LVEDP 17   . NM MYOVIEW LTD  December 2012   Inferior infarct but no ischemia  . TEE WITHOUT CARDIOVERSION N/A 09/04/2019   Procedure: INTRAOPERATIVE TRANSESOPHAGEAL ECHOCARDIOGRAM (TEE);  Surgeon: Gaye Pollack, MD;  Location: [facility];  Service: Open Heart Surgery;  Intra-Op TEE: EF 50-55%.  Mild MR normal aortic, pulmonic and tricuspid valves. ->  Stable postop  . TONSILLECTOMY  as a child  . TRANSTHORACIC ECHOCARDIOGRAM  08/2017   Normal echo-normal LV size and function.  EF 55-60%.  No R WMA.  No ASD/PFO.  No valve lesions.   Social History   Social History Narrative   He lives in [place], [place] near [place] with his wife. [place] on [place] farm.  Near the former [address] F[address] takes houses his horses.   At baseline, he is extremely active around [place] farm, but does not have a routine exercise regimen.   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   He quit smoking in 1994.   Immunization History  Administered Date(s) Administered  . Influenza,inj,Quad PF,6+ Mos 01/17/2013, 04/07/2015, 03/06/2017  . Pneumococcal Polysaccharide-23 01/17/2013     Objective: Vital Signs: BP 128/79 (BP Location: Right Arm, Patient Position: Sitting, Cuff Size: Normal)   Pulse 65   Ht 5' 10.5" (1.791 m)   Wt 218 lb 3.2 oz (99 kg)   BMI 30.87 kg/m    Physical Exam HENT:     Right Ear: External ear normal.     Left Ear: External ear normal.  Eyes:     Conjunctiva/sclera: Conjunctivae normal.  Skin:    General:  Skin is warm and dry.     Findings: No rash.     Comments: Palmar calluses  Neurological:     General: No focal deficit present.     Mental Status: He is alert.  Psychiatric:        Mood and Affect: Mood normal.     Musculoskeletal Exam:  Elbows full ROM no tenderness or swelling Wrists full ROM no tenderness or swelling Fingers full ROM no tenderness or swelling Knees full ROM no tenderness or swelling Ankles full ROM no tenderness or swelling MTPs full ROM no tenderness or swelling  Investigation: No additional findings.  Imaging: No results found.  Recent Labs: Lab Results  Component Value Date   WBC 8.9 03/23/2020   HGB 14.3 03/23/2020   PLT 306 03/23/2020   NA 139 09/09/2019   K 4.0 09/09/2019   CL 101 09/09/2019   CO2 28 09/09/2019   GLUCOSE 107 (H) 09/09/2019   BUN 21 (H) 09/09/2019   CREATININE 1.30 (H) 04/08/2020   BILITOT 0.4 04/23/2020   ALKPHOS 66 09/02/2019   AST 42 (H) 04/23/2020   ALT 31 04/23/2020   PROT 6.9 04/23/2020   ALBUMIN 4.3 09/02/2019   CALCIUM 9.4 09/09/2019   GFRAA >60 09/09/2019    Speciality Comments: No specialty comments available.  Procedures:  No procedures performed Allergies: Patient has no known allergies.   Assessment / Plan:     Visit Diagnoses: Idiopathic chronic gout of multiple sites without tophus - Plan: Sedimentation rate, COMPLETE METABOLIC PANEL WITH GFR, Uric acid  Idiopathic chronic nontophaceous gout with probable associated erosion at the left first MTP joint.  Currently he has no symptoms.  Does describe recent prolonged inflammatory symptoms question if this could be related to the ligamentous sprain that was involved or see developing a more persistent chronic gout.  Check uric acid will treat to goal below 6.  Currently allopurinol 300 mg will stay on this or increased dose based on findings.  Also checking sedimentation rate and metabolic panel for elevated inflammatory markers between flares also for any  baseline renal insufficiency.  Orders: Orders Placed This Encounter  Procedures  . Sedimentation rate  . COMPLETE METABOLIC PANEL WITH GFR  . Uric acid   No orders of the  defined types were placed in this encounter.   Follow-Up Instructions: No follow-ups on file.   Collier Salina, MD  Note - This record has been created using [employer].  Chart creation errors have been sought, but may not always  have been located. Such creation errors do not reflect on  the standard of medical care.

## 2020-05-14 ENCOUNTER — Ambulatory Visit (INDEPENDENT_AMBULATORY_CARE_PROVIDER_SITE_OTHER): Payer: 59 | Admitting: Internal Medicine

## 2020-05-14 ENCOUNTER — Other Ambulatory Visit: Payer: Self-pay

## 2020-05-14 ENCOUNTER — Encounter: Payer: Self-pay | Admitting: Internal Medicine

## 2020-05-14 VITALS — BP 128/79 | HR 65 | Ht 70.5 in | Wt 218.2 lb

## 2020-05-14 DIAGNOSIS — M1A09X0 Idiopathic chronic gout, multiple sites, without tophus (tophi): Secondary | ICD-10-CM | POA: Diagnosis not present

## 2020-05-14 DIAGNOSIS — M1009 Idiopathic gout, multiple sites: Secondary | ICD-10-CM | POA: Insufficient documentation

## 2020-05-14 NOTE — Patient Instructions (Signed)
Your symptoms sound very consistent with gout and foot MRI shows small erosive damage at the joint. Continue the allopurinol 371m daily for now.  We are checking if the uric acid is adequately lowered with this dose. I am also checking your kidney and liver function tests to see that there is no other problem that could be underlying increased gout activity.  If it is too high, I wwould recommend increasing this medication and following up in several weeks to see that it improved this.  If it is already low enough, I would recommend staying on this and follow up when symptoms return again.

## 2020-05-15 LAB — COMPLETE METABOLIC PANEL WITH GFR
AG Ratio: 1.7 (calc) (ref 1.0–2.5)
ALT: 24 U/L (ref 9–46)
AST: 32 U/L (ref 10–35)
Albumin: 4.4 g/dL (ref 3.6–5.1)
Alkaline phosphatase (APISO): 90 U/L (ref 35–144)
BUN/Creatinine Ratio: 17 (calc) (ref 6–22)
BUN: 23 mg/dL (ref 7–25)
CO2: 26 mmol/L (ref 20–32)
Calcium: 9.7 mg/dL (ref 8.6–10.3)
Chloride: 104 mmol/L (ref 98–110)
Creat: 1.35 mg/dL — ABNORMAL HIGH (ref 0.70–1.33)
GFR, Est African American: 68 mL/min/{1.73_m2} (ref >=60)
GFR, Est Non African American: 59 mL/min/{1.73_m2} — ABNORMAL LOW (ref >=60)
Globulin: 2.6 g/dL (calc) (ref 1.9–3.7)
Glucose, Bld: 95 mg/dL (ref 65–99)
Potassium: 4.2 mmol/L (ref 3.5–5.3)
Sodium: 141 mmol/L (ref 135–146)
Total Bilirubin: 0.3 mg/dL (ref 0.2–1.2)
Total Protein: 7 g/dL (ref 6.1–8.1)

## 2020-05-15 LAB — SEDIMENTATION RATE: Sed Rate: 2 mm/h (ref 0–20)

## 2020-05-15 LAB — URIC ACID: Uric Acid, Serum: 4.6 mg/dL (ref 4.0–8.0)

## 2020-05-17 ENCOUNTER — Telehealth: Payer: Self-pay

## 2020-05-17 NOTE — Progress Notes (Signed)
Uric acid is less than 6 so would not increase allopurinol dose. With no current symptoms would not start any antiinflammatory medication. I would like to see him back when symptoms return to look at that time to examine and treat, otherwise no changes recommended currently.

## 2020-05-17 NOTE — Telephone Encounter (Signed)
Daniel Buck from [facility] called stating Daniel Buck is a mutual patient and she was returning your call.  [phone]

## 2020-05-24 ENCOUNTER — Other Ambulatory Visit: Payer: Self-pay | Admitting: Cardiology

## 2020-05-24 MED FILL — CLOPIDOGREL 75 MG TABLET: 75 | 90 days supply | Qty: 90 | Fill #0

## 2020-05-25 ENCOUNTER — Ambulatory Visit: Payer: 59 | Admitting: Cardiology

## 2020-05-25 NOTE — Progress Notes (Deleted)
Primary Care Provider: [name], MD Cardiologist: Glenetta Hew, MD Electrophysiologist: None  Clinic Note: No chief complaint on file.   ===================================  ASSESSMENT/PLAN   Problem List Items Addressed This Visit    S/P CABG x 4   Essential hypertension, benign - Primary (Chronic)   Myocardial infarct, old (Chronic)   CAD S/P percutaneous coronary angioplasty (Chronic)   Hyperlipidemia LDL goal <70 (Chronic)   Heart palpitations (Chronic)   Coronary artery disease involving native coronary artery of native heart with angina pectoris (HCC) (Chronic)     ===================================  HPI:    Daniel Buck is a 55 y.o. male with a PMH for early onset CAD (inferior STEMI in 1996-BMS PCI RCA, 2002 restenosis atherectomy and PCI) -> s/p CABG X 4 (September 04, 2019) below who presents today for ***.   1996: Inferior STEMI -BMS PCI RCA  2002: Restenosis of RCA atherectomy and PCI  08/22/2019: OP cath for progressive anginal symptoms noted during clinic visit 12/21/2019 (exertional chest pain palpitations, while on a hunting trip in [place]) => MV CAD (progression of disease-see below)  09/04/2019: CABG x4: LIMA to LAD, SVG-OM, SVG-Diag, SVG-RPL  Daniel Buck was last seen on November 26, 2019 for second post hospital follow-up.  Was doing fairly well.  No significant DOE or chest pain.  Some good days, some bad.  Energy level slowly coming back.  Easily irritable, but otherwise in good spirits.  Off-and-on palpitations/forceful beats are.  Some pleuritic pain.  Still exercising.  3-day Zio patch monitor ordered.  Recent Hospitalizations:   None  Reviewed  CV studies:    The following studies were reviewed today: (if available, images/films reviewed: From Epic Chart or Care Everywhere) . 3-day Zio patch September 2021: Predominantly sinus rhythm.  Heart rate range 55-166 bpm.  Average 86 bpm.  Rare isolated PACs and PVCs.  Some PVC couplets.  No  arrhythmias.  Symptoms notably PVCs. o => Plan was to increase beta-blocker.   Carotid Dopplers 03/15/2020: R ICA 1-39%; nonhemodynamically significant < 50% R CCA.  No stenosis of left ICA.  Bilateral vertebral arteries antegrade flow.  Normal bilateral subclavian artery flow.   Zio patch monitor January 2022: BMP normal sinus rhythm with rate range 58 to 146 bpm.  Average 81 bpm.  Rare PACs and PVCs.  No couplets or triplets.  No arrhythmias.  Interval History:   Daniel Buck   CV Review of Symptoms (Summary) Cardiovascular ROS: {roscv:310661}   (Summary): no chest pain or dyspnea on exertion positive for - irregular heartbeat, palpitations and Notably improved fatigue.  Easy irritability. negative for - edema, orthopnea, paroxysmal nocturnal dyspnea, rapid heart rate, shortness of breath or Lightheadedness or dizziness, syncope/near syncope or TIA/amaurosis fugax, claudication  The patient {does/does not:200015} have symptoms concerning for COVID-19 infection (fever, chills, cough, or new shortness of breath).   REVIEWED OF SYSTEMS   ROS Weight gain Energy level slowly getting better.  DOE whenever he overdoes it. Easily irritable  I have reviewed and (if needed) personally updated the patient's problem list, medications, allergies, past medical and surgical history, social and family history.   PAST MEDICAL HISTORY   Past Medical History:  Diagnosis Date  . Carpal tunnel syndrome on left 12/2011  . Chronic nasal congestion   . Depression 01/19/2012  . GERD (gastroesophageal reflux disease)   . Gout   . History of Doppler ultrasound    a. Carotid US 3/11: no ICA stenosis   . History of  echocardiogram    a. Echo 6/14: mod LVH, EF 50-55%, inf-lat HK, Gr 1 DD, mild LAE  . History of hiatal hernia   . Hx of CABG 09/04/2019   LIMA-LAD, SVG-OM, SVG-Dx, and an SVG-RPLA.  [name] Hyperlipidemia LDL goal < 70   . Hypertension    under control; has been on med. since 1996  .  Multiple vessel coronary artery disease 1996, '02; '21   a. s/p Inf MI (age 38) >>PCI to RCA with tandem BMS (PS 1535); b. LHC 06/2000: pRCA 30-40%, prox/mid stents with 75% ISR, 30-40% at crux in RCA >> PCI: Cutting Balloon atherectomy for ISR; c. 2012 Myoview-nonischemic; d. 07/2019 -cath with progression of disease/LM disease => CABG x4  . Myocardial infarct, old 1996   PCI  . Sleep apnea    no testing yet  . Ulcerative colitis ([place])     PAST SURGICAL HISTORY   Past Surgical History:  Procedure Laterality Date  . CARPAL TUNNEL RELEASE  01/26/2012   Procedure: CARPAL TUNNEL RELEASE;  Surgeon: Cammie Sickle., MD;  Location: [facility];  Service: Orthopedics;  Laterality: Left;  . CORONARY ANGIOPLASTY  07/11/2000   Cutting Balloon PTCA for RCA ISR  . CORONARY ARTERY BYPASS GRAFT N/A 09/04/2019   Procedure: CORONARY ARTERY BYPASS GRAFTING (CABG) using LIMA to LAD; Endoscopic Vein Harvest of Right Greater Saphenous: SVG to Diag1; SVG to OM1; SVG to PL.;  Surgeon: Gaye Pollack, MD;  Location: [facility];  Service: Open Heart Surgery;  Laterality: N/A;  . CORONARY STENT PLACEMENT  1996   [id number] BMS x 2 in RCA  . CYSTECTOMY  as a child   from neck  . ENDOVEIN HARVEST OF GREATER SAPHENOUS VEIN Right 09/04/2019   Procedure: ENDOVEIN HARVEST OF GREATER SAPHENOUS VEIN;  Surgeon: Gaye Pollack, MD;  Location: [facility];  Service: Open Heart Surgery;  Laterality: Right;  Endovein scope harvest of right upper and lower LE.  [name] HERNIA REPAIR    . LEFT HEART CATH AND CORONARY ANGIOGRAPHY N/A 08/22/2019   Procedure: LEFT HEART CATH AND CORONARY ANGIOGRAPHY;  Surgeon: Belva Crome, MD;  Location: [facility];;  Eccentric ostial 40 of 50% LM followed by 55-70% distal LM.  65% midLAD (heavily calcified), ostial D1 80%, LCx 70% between OM1 and OM2, RCA large with diffuse ISR prior PCI in 1996 and 2002-up to 90%.  PDA with competitive flow due to collateral flow from LCA.  EF 50-60%.  LVEDP  17   . NM MYOVIEW LTD  December 2012   Inferior infarct but no ischemia  . TEE WITHOUT CARDIOVERSION N/A 09/04/2019   Procedure: INTRAOPERATIVE TRANSESOPHAGEAL ECHOCARDIOGRAM (TEE);  Surgeon: Gaye Pollack, MD;  Location: [facility];  Service: Open Heart Surgery;  Intra-Op TEE: EF 50-55%.  Mild MR normal aortic, pulmonic and tricuspid valves. ->  Stable postop  . TONSILLECTOMY  as a child  . TRANSTHORACIC ECHOCARDIOGRAM  08/2017   Normal echo-normal LV size and function.  EF 55-60%.  No R WMA.  No ASD/PFO.  No valve lesions.   PreCABG Cath 08/22/2019: Ost LM 40-50%, dLM 55-70%, heavily calcified mLAD 65% s/ Ost D1 80%; mLCX 70% btw OM1&OM2; diffuse 85% ISR of prior RCA stents with 85% stenosis proximal and 90% distal.  Collateral flow from the LCx to the PDA.  EF 55 to 60%.  EDP 17 mmHg.    Immunization History  Administered Date(s) Administered  . Influenza,inj,Quad PF,6+ Mos 01/17/2013, 04/07/2015, 03/06/2017  .  Pneumococcal Polysaccharide-23 01/17/2013    MEDICATIONS/ALLERGIES   No outpatient medications have been marked as taking for the 05/25/20 encounter (Appointment) with [name], MD.    No Known Allergies  SOCIAL HISTORY/FAMILY HISTORY   Reviewed in Epic:  Pertinent findings:  Social History   Tobacco Use  . Smoking status: Former Smoker    Quit date: 03/27/1992    Years since quitting: 28.1  . Smokeless tobacco: Current User    Types: Snuff, Chew  Vaping Use  . Vaping Use: Never used  Substance Use Topics  . Alcohol use: No  . Drug use: No   Social History   Social History Narrative   He lives in [place], [place] near [place] with his wife. [place] lives on [place] farm.  Near the former [address] takes houses his horses.   At baseline, he is extremely active around the farm, but does not have a routine exercise regimen.   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   He quit smoking in 1994.    OBJCTIVE -PE, EKG, labs   Wt Readings from Last 3 Encounters:   05/14/20 218 lb 3.2 oz (99 kg)  11/26/19 204 lb (92.5 kg)  11/12/19 200 lb (90.7 kg)    Physical Exam: There were no vitals taken for this visit. Physical Exam Vitals reviewed.  Constitutional:      General: He is not in acute distress.    Appearance: Normal appearance. He is not ill-appearing or toxic-appearing.  HENT:     Head: Normocephalic and atraumatic.  Neck:     Vascular: No carotid bruit, hepatojugular reflux or JVD.  Cardiovascular:     Rate and Rhythm: Normal rate and regular rhythm. Occasional extrasystoles are present.    Chest Wall: PMI is not displaced.     Pulses: Normal pulses.     Heart sounds: Normal heart sounds. No murmur heard. No friction rub. No gallop.   Pulmonary:     Effort: Pulmonary effort is normal. No respiratory distress.     Breath sounds: Normal breath sounds.  Chest:     Chest wall: No tenderness.  Musculoskeletal:        General: No swelling. Normal range of motion.     Cervical back: Normal range of motion and neck supple.  Skin:    General: Skin is warm and dry.  Neurological:     General: No focal deficit present.     Mental Status: He is alert and oriented to person, place, and time.     Motor: No weakness.  Psychiatric:        Mood and Affect: Mood normal.        Behavior: Behavior normal.        Thought Content: Thought content normal.        Judgment: Judgment normal.      Adult ECG Report  Rate: *** ;  Rhythm: {rhythm:17366};   Narrative Interpretation: ***  Recent Labs:  ***  Lab Results  Component Value Date   CHOL 139 04/23/2020   HDL 31 (L) 04/23/2020   LDLCALC 86 04/23/2020   TRIG 128 04/23/2020   CHOLHDL 4.5 04/23/2020   Lab Results  Component Value Date   CREATININE 1.35 (H) 05/14/2020   BUN 23 05/14/2020   NA 141 05/14/2020   K 4.2 05/14/2020   CL 104 05/14/2020   CO2 26 05/14/2020   CBC Latest Ref Rng & Units 03/23/2020 09/09/2019 09/08/2019  WBC 3.4 -  10.8 x10E3/uL 8.9 10.3 11.2(H)  Hemoglobin  13.0 - 17.7 g/dL 14.3 12.7(L) 12.1(L)  Hematocrit 37.5 - 51.0 % 46.3 38.6(L) 37.3(L)  Platelets 150 - 450 x10E3/uL 306 238 202    Lab Results  Component Value Date   TSH 2.652 06/14/2016    ==================================================  COVID-19 Education: The signs and symptoms of COVID-19 were discussed with the patient and how to seek care for testing (follow up with PCP or arrange E-visit).   The importance of social distancing and COVID-19 vaccination was discussed today. The patient {ACTION; IS/IS EXN:17001749} practicing social distancing & Masking.   I spent a total of ***minutes with the patient spent in direct patient consultation.  Additional time spent with chart review  / charting (studies, outside notes, etc): *** min Total Time: *** min   Current medicines are reviewed at length with the patient today.  (+/- concerns) ***  This visit occurred during the SARS-CoV-2 public health emergency.  Safety protocols were in place, including screening questions prior to the visit, additional usage of staff PPE, and extensive cleaning of exam room while observing appropriate contact time as indicated for disinfecting solutions.  Notice: This dictation was prepared with Dragon dictation along with smaller phrase technology. Any transcriptional errors that result from this process are unintentional and may not be corrected upon review.  Patient Instructions / Medication Changes & Studies & Tests Ordered   There are no Patient Instructions on file for this visit.   Studies Ordered:   No orders of the defined types were placed in this encounter.    Glenetta Hew, M.D., M.S. Interventional Cardiologist   Pager # [phone] Phone # [phone] [address] [place], [place] [address]   Thank you for choosing [facility] at [facility]!!

## 2020-06-07 ENCOUNTER — Other Ambulatory Visit (HOSPITAL_COMMUNITY): Payer: Self-pay | Admitting: Family Medicine

## 2020-06-07 DIAGNOSIS — I130 Hypertensive heart and chronic kidney disease with heart failure and stage 1 through stage 4 chronic kidney disease, or unspecified chronic kidney disease: Secondary | ICD-10-CM | POA: Diagnosis not present

## 2020-06-07 DIAGNOSIS — R7301 Impaired fasting glucose: Secondary | ICD-10-CM | POA: Diagnosis not present

## 2020-06-07 DIAGNOSIS — Z0000 Encounter for general adult medical examination without abnormal findings: Secondary | ICD-10-CM | POA: Diagnosis not present

## 2020-06-07 DIAGNOSIS — K5190 Ulcerative colitis, unspecified, without complications: Secondary | ICD-10-CM | POA: Diagnosis not present

## 2020-06-07 DIAGNOSIS — F411 Generalized anxiety disorder: Secondary | ICD-10-CM | POA: Diagnosis not present

## 2020-06-07 DIAGNOSIS — M109 Gout, unspecified: Secondary | ICD-10-CM | POA: Diagnosis not present

## 2020-06-07 DIAGNOSIS — N183 Chronic kidney disease, stage 3 unspecified: Secondary | ICD-10-CM | POA: Diagnosis not present

## 2020-06-07 DIAGNOSIS — E7800 Pure hypercholesterolemia, unspecified: Secondary | ICD-10-CM | POA: Diagnosis not present

## 2020-06-07 DIAGNOSIS — K219 Gastro-esophageal reflux disease without esophagitis: Secondary | ICD-10-CM | POA: Diagnosis not present

## 2020-06-07 DIAGNOSIS — I2510 Atherosclerotic heart disease of native coronary artery without angina pectoris: Secondary | ICD-10-CM | POA: Diagnosis not present

## 2020-06-10 DIAGNOSIS — E559 Vitamin D deficiency, unspecified: Secondary | ICD-10-CM | POA: Diagnosis not present

## 2020-06-10 DIAGNOSIS — E291 Testicular hypofunction: Secondary | ICD-10-CM | POA: Diagnosis not present

## 2020-06-10 DIAGNOSIS — E034 Atrophy of thyroid (acquired): Secondary | ICD-10-CM | POA: Diagnosis not present

## 2020-06-10 DIAGNOSIS — R5383 Other fatigue: Secondary | ICD-10-CM | POA: Diagnosis not present

## 2020-06-22 DIAGNOSIS — H35033 Hypertensive retinopathy, bilateral: Secondary | ICD-10-CM | POA: Diagnosis not present

## 2020-06-22 DIAGNOSIS — H2513 Age-related nuclear cataract, bilateral: Secondary | ICD-10-CM | POA: Diagnosis not present

## 2020-06-26 ENCOUNTER — Other Ambulatory Visit (HOSPITAL_COMMUNITY): Payer: Self-pay

## 2020-06-28 ENCOUNTER — Encounter: Payer: Self-pay | Admitting: Cardiology

## 2020-06-28 ENCOUNTER — Ambulatory Visit: Payer: 59 | Admitting: Cardiology

## 2020-06-28 ENCOUNTER — Other Ambulatory Visit: Payer: Self-pay

## 2020-06-28 VITALS — BP 126/78 | HR 79 | Ht 70.5 in | Wt 210.0 lb

## 2020-06-28 DIAGNOSIS — R42 Dizziness and giddiness: Secondary | ICD-10-CM | POA: Diagnosis not present

## 2020-06-28 DIAGNOSIS — G4730 Sleep apnea, unspecified: Secondary | ICD-10-CM

## 2020-06-28 DIAGNOSIS — Z951 Presence of aortocoronary bypass graft: Secondary | ICD-10-CM | POA: Diagnosis not present

## 2020-06-28 DIAGNOSIS — E785 Hyperlipidemia, unspecified: Secondary | ICD-10-CM

## 2020-06-28 DIAGNOSIS — R002 Palpitations: Secondary | ICD-10-CM | POA: Diagnosis not present

## 2020-06-28 DIAGNOSIS — I25119 Atherosclerotic heart disease of native coronary artery with unspecified angina pectoris: Secondary | ICD-10-CM | POA: Diagnosis not present

## 2020-06-28 DIAGNOSIS — I10 Essential (primary) hypertension: Secondary | ICD-10-CM

## 2020-06-28 DIAGNOSIS — I2089 Other forms of angina pectoris: Secondary | ICD-10-CM | POA: Insufficient documentation

## 2020-06-28 DIAGNOSIS — I252 Old myocardial infarction: Secondary | ICD-10-CM

## 2020-06-28 DIAGNOSIS — I208 Other forms of angina pectoris: Secondary | ICD-10-CM

## 2020-06-28 MED ORDER — NITROGLYCERIN 0.4 MG SL SUBL
0.4000 mg | SUBLINGUAL_TABLET | SUBLINGUAL | 6 refills | Status: DC | PRN
  Filled 2020-06-28: qty 25, 15d supply, fill #0

## 2020-06-28 MED ORDER — AMLODIPINE BESYLATE 2.5 MG PO TABS
2.5000 mg | ORAL_TABLET | Freq: Every day | ORAL | 3 refills | Status: DC
  Filled 2020-06-28: qty 90, 90d supply, fill #0
  Filled 2020-09-23: qty 90, 90d supply, fill #1
  Filled 2020-12-21: qty 90, 90d supply, fill #2
  Filled 2021-03-21: qty 90, 90d supply, fill #3

## 2020-06-28 NOTE — Progress Notes (Signed)
Primary Care Provider: [name], MD Cardiologist: Glenetta Hew, MD Electrophysiologist: None  Clinic Note: Chief Complaint  Patient presents with  . Fatigue    Exercise intolerance, dyspnea on exertion  . Coronary Artery Disease    Exertional dyspnea with occasional chest discomfort    ===================================  ASSESSMENT/PLAN   Problem List Items Addressed This Visit    Atypical angina ([place]) - Primary    Some difficult other atypical features with his chest discomfort and dyspnea.  Mostly seems exercise intolerance.  He I think a lot of his symptoms may be related to OSA and poor sleep with fatigue.  However I am concerned with him being this close to his CABG that could be a mechanical issue with his grafts.  Plan: Lexiscan Myoview-low threshold to relook cath.  For now, continue current dose of amlodipine Lopressor.  Anginal benefit.  Low threshold to consider increasing back to 5 mg amlodipine.  Shared Decision Making/Informed Consent{ The risks [chest pain, shortness of breath, cardiac arrhythmias, dizziness, blood pressure fluctuations, myocardial infarction, stroke/transient ischemic attack, nausea, vomiting, allergic reaction, radiation exposure, metallic taste sensation and life-threatening complications (estimated to be 1 in 10,000)], benefits (risk stratification, diagnosing coronary artery disease, treatment guidance) and alternatives of a nuclear stress test were discussed in detail with Daniel Buck and he agrees to proceed.       Relevant Medications   amLODipine (NORVASC) 2.5 MG tablet   nitroGLYCERIN (NITROSTAT) 0.4 MG SL tablet   Other Relevant Orders   Cardiac Stress Test: Informed Consent Details: Physician/Practitioner Attestation; Transcribe to consent form and obtain patient signature   MYOCARDIAL PERFUSION IMAGING   Lipid panel   Comprehensive metabolic panel   CBC   Sleep-disordered breathing    Significantly elevated Epworth  scale with daytime fatigue.  I wonder if this is probably depression symptoms.  Following ischemic evaluation, plan check polysomnogram.  Apparently had had a polysomnogram the past but never followed up.      Orthostatic dizziness (Chronic)    Less prominent.  We had stopped his ACE inhibitor/ARB: Additional blood pressure control.  He is on amlodipine.  Hemoglobin 3.  Think we may be an increase if necessary.      Essential hypertension, benign (Chronic)    Stable blood pressure.  On low-dose amlodipine and moderate-dose metoprolol tartrate (Lopressor).  Palpitations well controlled.  May need to increase amlodipine to 5 mg for additional antianginal benefit.      Relevant Medications   amLODipine (NORVASC) 2.5 MG tablet   nitroGLYCERIN (NITROSTAT) 0.4 MG SL tablet   Other Relevant Orders   Comprehensive metabolic panel   Myocardial infarct, old (Chronic)    Prior inferior infarct noted in 2012.  Preserved EF.  He has not had another Myoview since then, will be looking for new ischemia on upcoming Myoview.      Relevant Medications   amLODipine (NORVASC) 2.5 MG tablet   nitroGLYCERIN (NITROSTAT) 0.4 MG SL tablet   Other Relevant Orders   Cardiac Stress Test: Informed Consent Details: Physician/Practitioner Attestation; Transcribe to consent form and obtain patient signature   Comprehensive metabolic panel   CBC   Hyperlipidemia LDL goal <70 (Chronic)    Interestingly, LDL in August of last year was 67, and has gone up to 38 as of this January.  Would probably need to address once the more urgent evaluation of potential ischemia is complete.  I had hoped to potentially stop Zetia, however I do not think that they will  be possible.  May need to actually add additional medication.  He does indicate that he had had some indiscretion his diet.      Relevant Medications   amLODipine (NORVASC) 2.5 MG tablet   nitroGLYCERIN (NITROSTAT) 0.4 MG SL tablet   Other Relevant Orders    Lipid panel   Comprehensive metabolic panel   Heart palpitations (Chronic)    Zio patch was grossly noncontributory.  Nothing significant.  Continue beta-blocker.      Relevant Orders   MYOCARDIAL PERFUSION IMAGING   Lipid panel   Comprehensive metabolic panel   CBC   S/P CABG x 4 (Chronic)    Still has fatigue which seems to be worse than it has been and some atypical anginal symptoms.  Plan:: Myoview for ischemic evaluation.      Relevant Orders   Cardiac Stress Test: Informed Consent Details: Physician/Practitioner Attestation; Transcribe to consent form and obtain patient signature   MYOCARDIAL PERFUSION IMAGING   Lipid panel   Comprehensive metabolic panel   CBC   Coronary artery disease involving native coronary artery of native heart with angina pectoris (HCC) (Chronic)    Multivessel disease status post CABG x410 months ago for recurrent anginal symptoms.  Now having some no unusual symptoms of not quite consistent with his previous angina, however still concerning.  Plan:   Continue aspirin and Plavix along with metoprolol has been repeated.  Continue rosuvastatin and Zetia. (We converted him from simvastatin) -  Lexiscan Myoview for ischemic evaluation.  Low threshold to consider invasive evaluation.      Relevant Medications   amLODipine (NORVASC) 2.5 MG tablet   nitroGLYCERIN (NITROSTAT) 0.4 MG SL tablet   Other Relevant Orders   Cardiac Stress Test: Informed Consent Details: Physician/Practitioner Attestation; Transcribe to consent form and obtain patient signature   Lipid panel   Comprehensive metabolic panel   CBC     ===================================  HPI:    Daniel Buck is a 55 y.o. male with a PMH notable for early onset CAD (inferior STEMI 1996-BMS PCI RCA, atherectomy and PCI for ISR 2002), CABG x4 June 2021, along with HTN, HLD and Ulcerative Colitis (on Humira) who presents today for 73-monthfollow-up November 26, 2019.  He had been seen by  Daniel Ransom PA on Aug 20, 2019 with exertional chest discomfort and palpitations similar prior anginal symptoms [id number]     Referred for cardiac cath 08/14/2019 revealing multivessel disease along with Left Main stenosis =>  CABG x 4 (LIMA-LAD, SVG-OM, SVG-Dx, SVG-rPL) => uncomplicated post hospital course.;  Moderate left foot effusion noted on chest x-ray, started colchicine and 5 days of Lasix for gout.  Postop follow-up 09/24/2019 doing well..  Occasional skipped beats but no sustained tachycardia.  Daniel Buck was last seen on November 26, 2019 as a 39-monthollow-up from post hospital evaluation following 4V CABG in May 2021.  I have not seen him since October 2019.  Was walking about 1/2 to 2 miles a day when he saw Dr. BaCyndia Bentn July.  Right-sided chest pain had improved and gout had improved as well.  He was doing fairly well with no significant exertional dyspnea or chest pain. => Is a bit better than others.  Energy level still little low.  Little more irritable since surgery.  Easily upset.  Off-and-on palpitations/forceful heartbeats (evaluated with 3-day Zio patch).  Recent Hospitalizations: None  Reviewed  CV studies:    The  following studies were reviewed today: (if available, images/films reviewed: From Epic Chart or Care Everywhere) . 3-day Zio patch monitor (January 2022): Rare PACs and PVCs.  No arrhythmias.  Mostly sinus rhythm rate ranged from 58 to 146 bpm.  Average 81 bpm. . Carotid Dopplers 03/15/2020: R ICA 1-39%.  R CCA <50%.  LICA no evidence of stenosis.  Minimal plaque in extracranial vessels.  Normal bilateral vertebral arteries and subclavian arteries.   Interval History:   Daniel Buck is here today sooner than usual with complaints of feeling more fatigue and short of breath.  He says he is tired all the time.  He gets short of breath after walking more than couple minutes and will also start noticing chest  discomfort.  In general he says he just does not feel very well.  He just does not feel it doing anything active.  He may go to 3 days and feel okay and then start feeling poorly again.  He also says that he spends a lot of time in bed tossing and turning, having hard time sleeping.  He does not feel he wakes up very tired or rested.  He can feel tired throughout the course of the day, often feels that he will fall asleep at any time.  He has occasional swelling of his hands and feet.  Occasional skipped beats but nothing prolonged.  But he usually notices that at night he will feel this forceful sensation in his chest may take his breath away.  No rapid regular heartbeat sensations.  He has occasional orthostatic dizziness.  CV Review of Symptoms (Summary)-Cardiovascular ROS: positive for - chest pain, dyspnea on exertion, edema, irregular heartbeat, palpitations and Fatigue, exercise intolerance, nonrestful sleep. negative for - orthopnea, paroxysmal nocturnal dyspnea, rapid heart rate, shortness of breath or Lightheadedness, dizziness or syncope/near syncope or TIA/amaurosis fugax, claudication   Epworth Sleepiness Scale: Situation   Chance of Dozing/Sleeping (0 = never , 1 = slight chance , 2 = moderate chance , 3 = high chance )   sitting and reading 2   watching TV 2   sitting inactive in a public place 2   being a passenger in a motor vehicle for an hour or more 3   lying down in the afternoon 3   sitting and talking to someone 0   sitting quietly after lunch (no alcohol) 3   while stopped for a few minutes in traffic as the driver 2   Total Score  17      The patient does not have symptoms concerning for COVID-19 infection (fever, chills, cough, or new shortness of breath).   REVIEWED OF SYSTEMS   Review of Systems  Constitutional: Positive for malaise/fatigue (Exercise intolerance, feels tired all the time, sleepy) and weight loss (Lost back some of the weight that he had  gained).  HENT: Positive for congestion. Negative for nosebleeds.   Respiratory: Positive for shortness of breath (With minimal exertion). Negative for cough.   Gastrointestinal: Negative for abdominal pain, blood in stool and melena.       No recent UC flare.  Genitourinary: Negative for frequency and hematuria.  Musculoskeletal: Positive for joint pain (  Mild musculoskeletal pains).  Neurological: Negative for dizziness (Occasionally orthostatic, but not routinely), focal weakness, weakness and headaches.  Psychiatric/Behavioral: Negative for memory loss. The patient has insomnia (Does have a hard time falling asleep, but just wakes up not rested.). The patient is not nervous/anxious.  He seems somewhat down.  Borderline dysthymic.    I have reviewed and (if needed) personally updated the patient's problem list, medications, allergies, past medical and surgical history, social and family history.   PAST MEDICAL HISTORY   Past Medical History:  Diagnosis Date  . Carpal tunnel syndrome on left 12/2011  . Chronic nasal congestion   . Depression 01/19/2012  . GERD (gastroesophageal reflux disease)   . Gout   . History of Doppler ultrasound    a. Carotid US 3/11: no ICA stenosis   . History of echocardiogram    a. Echo 6/14: mod LVH, EF 50-55%, inf-lat HK, Gr 1 DD, mild LAE  . History of hiatal hernia   . Hx of CABG 09/04/2019   LIMA-LAD, SVG-OM, SVG-Dx, and an SVG-RPLA.  [name] Hyperlipidemia LDL goal < 70   . Hypertension    under control; has been on med. since 1996  . Multiple vessel coronary artery disease 1996, '02; '21   a. s/p Inf MI (age 31) >>PCI to RCA with tandem BMS (PS 1535); b. LHC 06/2000: pRCA 30-40%, prox/mid stents with 75% ISR, 30-40% at crux in RCA >> PCI: Cutting Balloon atherectomy for ISR; c. 2012 Myoview-nonischemic; d. 07/2019 -cath with progression of disease/LM disease => CABG x4  . Myocardial infarct, old 1996   PCI  . Sleep apnea    no testing yet  .  Ulcerative colitis ([place])     PAST SURGICAL HISTORY   Past Surgical History:  Procedure Laterality Date  . CARPAL TUNNEL RELEASE  01/26/2012   Procedure: CARPAL TUNNEL RELEASE;  Surgeon: Cammie Sickle., MD;  Location: [facility];  Service: Orthopedics;  Laterality: Left;  . CORONARY ANGIOPLASTY  07/11/2000   Cutting Balloon PTCA for RCA ISR  . CORONARY ARTERY BYPASS GRAFT N/A 09/04/2019   Procedure: CORONARY ARTERY BYPASS GRAFTING (CABG) using LIMA to LAD; Endoscopic Vein Harvest of Right Greater Saphenous: SVG to Diag1; SVG to OM1; SVG to PL.;  Surgeon: Gaye Pollack, MD;  Location: [facility];  Service: Open Heart Surgery;  Laterality: N/A;  . CORONARY STENT PLACEMENT  1996   [id number] BMS x 2 in RCA  . CYSTECTOMY  as a child   from neck  . ENDOVEIN HARVEST OF GREATER SAPHENOUS VEIN Right 09/04/2019   Procedure: ENDOVEIN HARVEST OF GREATER SAPHENOUS VEIN;  Surgeon: Gaye Pollack, MD;  Location: [facility];  Service: Open Heart Surgery;  Laterality: Right;  Endovein scope harvest of right upper and lower LE.  [name] HERNIA REPAIR    . LEFT HEART CATH AND CORONARY ANGIOGRAPHY N/A 08/22/2019   Procedure: LEFT HEART CATH AND CORONARY ANGIOGRAPHY;  Surgeon: Belva Crome, MD;  Location: [facility];;  Eccentric ostial 40 of 50% LM followed by 55-70% distal LM.  65% midLAD (heavily calcified), ostial D1 80%, LCx 70% between OM1 and OM2, RCA large with diffuse ISR prior PCI in 1996 and 2002-up to 90%.  PDA with competitive flow due to collateral flow from LCA.  EF 50-60%.  LVEDP 17   . NM MYOVIEW LTD  December 2012   Inferior infarct but no ischemia  . TEE WITHOUT CARDIOVERSION N/A 09/04/2019   Procedure: INTRAOPERATIVE TRANSESOPHAGEAL ECHOCARDIOGRAM (TEE);  Surgeon: Gaye Pollack, MD;  Location: [facility];  Service: Open Heart Surgery;  Intra-Op TEE: EF 50-55%.  Mild MR normal aortic, pulmonic and tricuspid valves. ->  Stable postop  . TONSILLECTOMY  as a child  . TRANSTHORACIC  ECHOCARDIOGRAM  08/2017   Normal echo-normal LV size and function.  EF 55-60%.  No R WMA.  No ASD/PFO.  No valve lesions.    Left Heart Cath-Coronaries (08/22/2019): Eccentric ostial 40 of 50% LM followed by 55-70% distal LM.  65% midLAD (heavily calcified), ostial D1 80%, LCx 70% between OM1 and OM2, RCA large with diffuse ISR prior PCI in 1996 and 2002-up to 90%.  PDA with competitive flow due to collateral flow from LCA.  EF 50-60%.  LVEDP 17 mmHg. Diagnostic- Dominance: Right    Carotid Dopplers 09/02/2019-relative normal  CABG x4 (09/04/2019): LIMA-LAD, SVG-OM, SVG-DX, SVG-RPLA (Dr. Cyndia Bent) ? Intra-Op TEE: EF 50-55%.  Mild MR normal aortic, pulmonic and tricuspid valves. ->  Stable postop   Immunization History  Administered Date(s) Administered  . Influenza,inj,Quad PF,6+ Mos 01/17/2013, 04/07/2015, 03/06/2017  . Pneumococcal Polysaccharide-23 01/17/2013    MEDICATIONS/ALLERGIES   Current Meds  Medication Sig  . acetaminophen (TYLENOL) 325 MG tablet Take 650 mg by mouth every 6 (six) hours as needed for moderate pain.  [name] allopurinol (ZYLOPRIM) 300 MG tablet TAKE 1 TABLET BY MOUTH ONCE DAILY.  [name] amLODipine (NORVASC) 2.5 MG tablet Take 1 tablet (2.5 mg total) by mouth daily.  [name] aspirin EC 81 MG tablet Take 1 tablet (81 mg total) by mouth daily.  . Cholecalciferol (VITAMIN D) 125 MCG (5000 UT) CAPS Take 5,000 Units by mouth daily.  . clopidogrel (PLAVIX) 75 MG tablet TAKE 1 TABLET BY MOUTH DAILY.  [name] colchicine 0.6 MG tablet as needed. For gout flares  . esomeprazole (NEXIUM) 40 MG capsule TAKE 1 CAPSULE BY MOUTH TWICE DAILY.  [name] ezetimibe (ZETIA) 10 MG tablet TAKE 1 TABLET (10 MG TOTAL) BY MOUTH DAILY.  [name] gabapentin (NEURONTIN) 300 MG capsule TAKE 1 (ONE) CAPSULE BY MOUTH THREE TIMES DAILY  . mesalamine (LIALDA) 1.2 g EC tablet TAKE 1 TABLET BY MOUTH FOUR TIMES DAILY FOR 30 DAYS.  [name] metoprolol tartrate (LOPRESSOR) 50 MG tablet TAKE 1 TABLET (50 MG TOTAL) BY MOUTH 2 (TWO) TIMES  DAILY.  . mometasone (ELOCON) 0.1 % cream USE AS DIRECTED 2 TIMES DAILY AS NEEDED  . nystatin (MYCOSTATIN) 100000 UNIT/ML suspension SWISH WITH 4 ML'S IN MOUTH/THROAT SPIT OUT OR SWALLOW 4 TIMES A DAY FOR 10 DAYS  . predniSONE (DELTASONE) 10 MG tablet TAKE 6 TABLETS BY MOUTH ON DAY 1 THEN DECREASE BY 1 TAB DAILY (6-5-4-3-2-1 TAPER DOSE)  . rosuvastatin (CRESTOR) 40 MG tablet TAKE 1 TABLET (40 MG TOTAL) BY MOUTH DAILY.  . Vilazodone HCl (VIIBRYD) 40 MG TABS TAKE 1 TABLET BY MOUTH WITH FOOD ONCE DAILY  . [DISCONTINUED] amLODipine (NORVASC) 5 MG tablet Take 0.5 tablets (2.5 mg total) by mouth daily.  . [DISCONTINUED] nitroGLYCERIN (NITROSTAT) 0.4 MG SL tablet as needed.    No Known Allergies  SOCIAL HISTORY/FAMILY HISTORY   Reviewed in Epic:  Pertinent findings:  Social History   Tobacco Use  . Smoking status: Former Smoker    Quit date: 03/27/1992    Years since quitting: 28.2  . Smokeless tobacco: Current User    Types: Snuff, Chew  Vaping Use  . Vaping Use: Never used  Substance Use Topics  . Alcohol use: No  . Drug use: No   Social History   Social History Narrative   He lives in [place], [place] near [place] with his wife. [place].  Near the former [address] takes houses his horses.   At baseline, he is extremely  active around the farm, but does not have a routine exercise regimen.   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   He quit smoking in 1994.    OBJCTIVE -PE, EKG, labs   Wt Readings from Last 3 Encounters:  06/28/20 210 lb (95.3 kg)  05/14/20 218 lb 3.2 oz (99 kg)  11/26/19 204 lb (92.5 kg)    Physical Exam: BP 126/78 (BP Location: Left Arm, Patient Position: Sitting, Cuff Size: Large)   Pulse 79   Ht 5' 10.5" (1.791 m)   Wt 210 lb (95.3 kg)   BMI 29.71 kg/m  Physical Exam Vitals reviewed.  Constitutional:      General: He is not in acute distress.    Appearance: Normal appearance. He is not ill-appearing or toxic-appearing.  HENT:      Head: Normocephalic.  Neck:     Vascular: No carotid bruit or JVD.  Cardiovascular:     Rate and Rhythm: Normal rate and regular rhythm. Occasional extrasystoles are present.    Chest Wall: PMI is not displaced.     Pulses: Normal pulses.     Heart sounds: S1 normal and S2 normal. No murmur heard. No friction rub. No gallop.   Pulmonary:     Effort: Pulmonary effort is normal. No respiratory distress.     Breath sounds: Normal breath sounds.  Chest:     Chest wall: No tenderness.  Musculoskeletal:        General: No swelling. Normal range of motion.     Cervical back: Normal range of motion and neck supple.  Skin:    General: Skin is warm and dry.  Neurological:     General: No focal deficit present.     Mental Status: He is alert and oriented to person, place, and time.     Motor: No weakness.     Gait: Gait normal.  Psychiatric:        Mood and Affect: Mood normal.        Behavior: Behavior normal.        Thought Content: Thought content normal.        Judgment: Judgment normal.     Adult ECG Report Not checked  Recent Labs: Reviewed; LDL not at goal. Lab Results  Component Value Date   CHOL 139 04/23/2020   HDL 31 (L) 04/23/2020   LDLCALC 86 04/23/2020   TRIG 128 04/23/2020   CHOLHDL 4.5 04/23/2020   Lab Results  Component Value Date   CREATININE 1.35 (H) 05/14/2020   BUN 23 05/14/2020   NA 141 05/14/2020   K 4.2 05/14/2020   CL 104 05/14/2020   CO2 26 05/14/2020   CBC Latest Ref Rng & Units 03/23/2020 09/09/2019 09/08/2019  WBC 3.4 - 10.8 x10E3/uL 8.9 10.3 11.2(H)  Hemoglobin 13.0 - 17.7 g/dL 14.3 12.7(L) 12.1(L)  Hematocrit 37.5 - 51.0 % 46.3 38.6(L) 37.3(L)  Platelets 150 - 450 x10E3/uL 306 238 202    Lab Results  Component Value Date   TSH 2.652 06/14/2016    ==================================================  COVID-19 Education: The signs and symptoms of COVID-19 were discussed with the patient and how to seek care for testing (follow up  with PCP or arrange E-visit).   The importance of social distancing and COVID-19 vaccination was discussed today. The patient is practicing social distancing & Masking.   I spent a total of 58mnutes with the patient spent in direct patient consultation.  Additional time spent with chart review  / charting (studies, outside  notes, etc): 15 min Total Time: 75 min   Current medicines are reviewed at length with the patient today.  (+/- concerns) NA  This visit occurred during the SARS-CoV-2 public health emergency.  Safety protocols were in place, including screening questions prior to the visit, additional usage of staff PPE, and extensive cleaning of exam room while observing appropriate contact time as indicated for disinfecting solutions.  Notice: This dictation was prepared with Dragon dictation along with smaller phrase technology. Any transcriptional errors that result from this process are unintentional and may not be corrected upon review.  Patient Instructions / Medication Changes & Studies & Tests Ordered   Patient Instructions  Medication Instructions:  No medication changes  *If you need a refill on your cardiac medications before your next appointment, please call your pharmacy*   Lab Work: Cbc cmp Lipid  If you have labs (blood work) drawn today and your tests are completely normal, you will receive your results only by: [name] MyChart Message (if you have MyChart) OR . A paper copy in the mail If you have any lab test that is abnormal or we need to change your treatment, we will call you to review the results.   Testing/Procedures: will be schedule at  [facility] office , [address] office or Hospital;  You will need a COVID-19  Test 3 days  prior to your procedure and self isolate. in between the test and procedure. . This is a Drive Up Visit at [address] [place], [place] [address]. Someone will direct you to the appropriate testing line. Stay in your car and  someone will be with you shortly.   Your physician has requested that you have a lexiscan myoview. Please follow instruction sheet, as given.   Follow-Up: At [facility], you and your health needs are our priority.  As part of our continuing mission to provide you with exceptional heart care, we have created designated Provider Care Teams.  These Care Teams include your primary Cardiologist (physician) and Advanced Practice Providers (APPs -  Physician Assistants and Nurse Practitioners) who all work together to provide you with the care you need, when you need it.  We recommend signing up for the patient portal called "MyChart".  Sign up information is provided on this After Visit Summary.  MyChart is used to connect with patients for Virtual Visits (Telemedicine).  Patients are able to view lab/test results, encounter notes, upcoming appointments, etc.  Non-urgent messages can be sent to your provider as well.   To learn more about what you can do with MyChart, go to [email or web address].    Your next appointment:   2 month(s)  The format for your next appointment:   In Person  Provider:   Glenetta Hew, MD      Studies Ordered:   Orders Placed This Encounter  Procedures  . Lipid panel  . Comprehensive metabolic panel  . CBC  . Cardiac Stress Test: Informed Consent Details: Physician/Practitioner Attestation; Transcribe to consent form and obtain patient signature  . MYOCARDIAL PERFUSION IMAGING     Glenetta Hew, M.D., M.S. Interventional Cardiologist   Pager # [phone] Phone # [phone] [address] [place], [place] [address]   Thank you for choosing [facility] at [facility]!!

## 2020-06-28 NOTE — Patient Instructions (Signed)
Medication Instructions:  No medication changes  *If you need a refill on your cardiac medications before your next appointment, please call your pharmacy*   Lab Work: Cbc cmp Lipid  If you have labs (blood work) drawn today and your tests are completely normal, you will receive your results only by: [name] MyChart Message (if you have MyChart) OR . A paper copy in the mail If you have any lab test that is abnormal or we need to change your treatment, we will call you to review the results.   Testing/Procedures: will be schedule at  [facility] office , [address] office or Hospital;  You will need a COVID-19  Test 3 days  prior to your procedure and self isolate. in between the test and procedure. . This is a Drive Up Visit at [address] [place], [place] [address]. Someone will direct you to the appropriate testing line. Stay in your car and someone will be with you shortly.   Your physician has requested that you have a lexiscan myoview. Please follow instruction sheet, as given.   Follow-Up: At [facility], you and your health needs are our priority.  As part of our continuing mission to provide you with exceptional heart care, we have created designated Provider Care Teams.  These Care Teams include your primary Cardiologist (physician) and Advanced Practice Providers (APPs -  Physician Assistants and Nurse Practitioners) who all work together to provide you with the care you need, when you need it.  We recommend signing up for the patient portal called "MyChart".  Sign up information is provided on this After Visit Summary.  MyChart is used to connect with patients for Virtual Visits (Telemedicine).  Patients are able to view lab/test results, encounter notes, upcoming appointments, etc.  Non-urgent messages can be sent to your provider as well.   To learn more about what you can do with MyChart, go to [email or web address].    Your next appointment:   2 month(s)  The  format for your next appointment:   In Person  Provider:   Glenetta Hew, MD

## 2020-06-28 NOTE — H&P (View-Only) (Signed)
Primary Care Provider: [name], MD Cardiologist: Glenetta Hew, MD Electrophysiologist: None  Clinic Note: Chief Complaint  Patient presents with  . Fatigue    Exercise intolerance, dyspnea on exertion  . Coronary Artery Disease    Exertional dyspnea with occasional chest discomfort    ===================================  ASSESSMENT/PLAN   Problem List Items Addressed This Visit    Atypical angina ([place]) - Primary    Some difficult other atypical features with his chest discomfort and dyspnea.  Mostly seems exercise intolerance.  He I think a lot of his symptoms may be related to OSA and poor sleep with fatigue.  However I am concerned with him being this close to his CABG that could be a mechanical issue with his grafts.  Plan: Lexiscan Myoview-low threshold to relook cath.  For now, continue current dose of amlodipine Lopressor.  Anginal benefit.  Low threshold to consider increasing back to 5 mg amlodipine.  Shared Decision Making/Informed Consent{ The risks [chest pain, shortness of breath, cardiac arrhythmias, dizziness, blood pressure fluctuations, myocardial infarction, stroke/transient ischemic attack, nausea, vomiting, allergic reaction, radiation exposure, metallic taste sensation and life-threatening complications (estimated to be 1 in 10,000)], benefits (risk stratification, diagnosing coronary artery disease, treatment guidance) and alternatives of a nuclear stress test were discussed in detail with Daniel Buck and he agrees to proceed.       Relevant Medications   amLODipine (NORVASC) 2.5 MG tablet   nitroGLYCERIN (NITROSTAT) 0.4 MG SL tablet   Other Relevant Orders   Cardiac Stress Test: Informed Consent Details: Physician/Practitioner Attestation; Transcribe to consent form and obtain patient signature   MYOCARDIAL PERFUSION IMAGING   Lipid panel   Comprehensive metabolic panel   CBC   Sleep-disordered breathing    Significantly elevated Epworth  scale with daytime fatigue.  I wonder if this is probably depression symptoms.  Following ischemic evaluation, plan check polysomnogram.  Apparently had had a polysomnogram the past but never followed up.      Orthostatic dizziness (Chronic)    Less prominent.  We had stopped his ACE inhibitor/ARB: Additional blood pressure control.  He is on amlodipine.  Hemoglobin 3.  Think we may be an increase if necessary.      Essential hypertension, benign (Chronic)    Stable blood pressure.  On low-dose amlodipine and moderate-dose metoprolol tartrate (Lopressor).  Palpitations well controlled.  May need to increase amlodipine to 5 mg for additional antianginal benefit.      Relevant Medications   amLODipine (NORVASC) 2.5 MG tablet   nitroGLYCERIN (NITROSTAT) 0.4 MG SL tablet   Other Relevant Orders   Comprehensive metabolic panel   Myocardial infarct, old (Chronic)    Prior inferior infarct noted in 2012.  Preserved EF.  He has not had another Myoview since then, will be looking for new ischemia on upcoming Myoview.      Relevant Medications   amLODipine (NORVASC) 2.5 MG tablet   nitroGLYCERIN (NITROSTAT) 0.4 MG SL tablet   Other Relevant Orders   Cardiac Stress Test: Informed Consent Details: Physician/Practitioner Attestation; Transcribe to consent form and obtain patient signature   Comprehensive metabolic panel   CBC   Hyperlipidemia LDL goal <70 (Chronic)    Interestingly, LDL in August of last year was 15, and has gone up to 72 as of this January.  Would probably need to address once the more urgent evaluation of potential ischemia is complete.  I had hoped to potentially stop Zetia, however I do not think that they will  be possible.  May need to actually add additional medication.  He does indicate that he had had some indiscretion his diet.      Relevant Medications   amLODipine (NORVASC) 2.5 MG tablet   nitroGLYCERIN (NITROSTAT) 0.4 MG SL tablet   Other Relevant Orders    Lipid panel   Comprehensive metabolic panel   Heart palpitations (Chronic)    Zio patch was grossly noncontributory.  Nothing significant.  Continue beta-blocker.      Relevant Orders   MYOCARDIAL PERFUSION IMAGING   Lipid panel   Comprehensive metabolic panel   CBC   S/P CABG x 4 (Chronic)    Still has fatigue which seems to be worse than it has been and some atypical anginal symptoms.  Plan:: Myoview for ischemic evaluation.      Relevant Orders   Cardiac Stress Test: Informed Consent Details: Physician/Practitioner Attestation; Transcribe to consent form and obtain patient signature   MYOCARDIAL PERFUSION IMAGING   Lipid panel   Comprehensive metabolic panel   CBC   Coronary artery disease involving native coronary artery of native heart with angina pectoris (HCC) (Chronic)    Multivessel disease status post CABG x410 months ago for recurrent anginal symptoms.  Now having some no unusual symptoms of not quite consistent with his previous angina, however still concerning.  Plan:   Continue aspirin and Plavix along with metoprolol has been repeated.  Continue rosuvastatin and Zetia. (We converted him from simvastatin) -  Lexiscan Myoview for ischemic evaluation.  Low threshold to consider invasive evaluation.      Relevant Medications   amLODipine (NORVASC) 2.5 MG tablet   nitroGLYCERIN (NITROSTAT) 0.4 MG SL tablet   Other Relevant Orders   Cardiac Stress Test: Informed Consent Details: Physician/Practitioner Attestation; Transcribe to consent form and obtain patient signature   Lipid panel   Comprehensive metabolic panel   CBC     ===================================  HPI:    Daniel Buck is a 55 y.o. male with a PMH notable for early onset CAD (inferior STEMI 1996-BMS PCI RCA, atherectomy and PCI for ISR 2002), CABG x4 June 2021, along with HTN, HLD and Ulcerative Colitis (on Humira) who presents today for 49-monthfollow-up November 26, 2019.  He had been seen by  Daniel Ransom PA on Aug 20, 2019 with exertional chest discomfort and palpitations similar prior anginal symptoms [place][phone][place]     Referred for cardiac cath 08/14/2019 revealing multivessel disease along with Left Main stenosis =>  CABG x 4 (LIMA-LAD, SVG-OM, SVG-Dx, SVG-rPL) => uncomplicated post hospital course.;  Moderate left foot effusion noted on chest x-ray, started colchicine and 5 days of Lasix for gout.  Postop follow-up 09/24/2019 doing well..  Occasional skipped beats but no sustained tachycardia.  Daniel Buck was last seen on November 26, 2019 as a 388-monthollow-up from post hospital evaluation following 4V CABG in May 2021.  I have not seen him since October 2019.  Was walking about 1/2 to 2 miles a day when he saw Dr. BaCyndia Bentn July.  Right-sided chest pain had improved and gout had improved as well.  He was doing fairly well with no significant exertional dyspnea or chest pain. => Is a bit better than others.  Energy level still little low.  Little more irritable since surgery.  Easily upset.  Off-and-on palpitations/forceful heartbeats (evaluated with 3-day Zio patch).  Recent Hospitalizations: None  Reviewed  CV studies:    The  following studies were reviewed today: (if available, images/films reviewed: From Epic Chart or Care Everywhere) . 3-day Zio patch monitor (January 2022): Rare PACs and PVCs.  No arrhythmias.  Mostly sinus rhythm rate ranged from 58 to 146 bpm.  Average 81 bpm. . Carotid Dopplers 03/15/2020: R ICA 1-39%.  R CCA <50%.  LICA no evidence of stenosis.  Minimal plaque in extracranial vessels.  Normal bilateral vertebral arteries and subclavian arteries.   Interval History:   Kahron C Faulkner is here today sooner than usual with complaints of feeling more fatigue and short of breath.  He says he is tired all the time.  He gets short of breath after walking more than couple minutes and will also start noticing chest  discomfort.  In general he says he just does not feel very well.  He just does not feel it doing anything active.  He may go to 3 days and feel okay and then start feeling poorly again.  He also says that he spends a lot of time in bed tossing and turning, having hard time sleeping.  He does not feel he wakes up very tired or rested.  He can feel tired throughout the course of the day, often feels that he will fall asleep at any time.  He has occasional swelling of his hands and feet.  Occasional skipped beats but nothing prolonged.  But he usually notices that at night he will feel this forceful sensation in his chest may take his breath away.  No rapid regular heartbeat sensations.  He has occasional orthostatic dizziness.  CV Review of Symptoms (Summary)-Cardiovascular ROS: positive for - chest pain, dyspnea on exertion, edema, irregular heartbeat, palpitations and Fatigue, exercise intolerance, nonrestful sleep. negative for - orthopnea, paroxysmal nocturnal dyspnea, rapid heart rate, shortness of breath or Lightheadedness, dizziness or syncope/near syncope or TIA/amaurosis fugax, claudication   Epworth Sleepiness Scale: Situation   Chance of Dozing/Sleeping (0 = never , 1 = slight chance , 2 = moderate chance , 3 = high chance )   sitting and reading 2   watching TV 2   sitting inactive in a public place 2   being a passenger in a motor vehicle for an hour or more 3   lying down in the afternoon 3   sitting and talking to someone 0   sitting quietly after lunch (no alcohol) 3   while stopped for a few minutes in traffic as the driver 2   Total Score  17      The patient does not have symptoms concerning for COVID-19 infection (fever, chills, cough, or new shortness of breath).   REVIEWED OF SYSTEMS   Review of Systems  Constitutional: Positive for malaise/fatigue (Exercise intolerance, feels tired all the time, sleepy) and weight loss (Lost back some of the weight that he had  gained).  HENT: Positive for congestion. Negative for nosebleeds.   Respiratory: Positive for shortness of breath (With minimal exertion). Negative for cough.   Gastrointestinal: Negative for abdominal pain, blood in stool and melena.       No recent UC flare.  Genitourinary: Negative for frequency and hematuria.  Musculoskeletal: Positive for joint pain (  Mild musculoskeletal pains).  Neurological: Negative for dizziness (Occasionally orthostatic, but not routinely), focal weakness, weakness and headaches.  Psychiatric/Behavioral: Negative for memory loss. The patient has insomnia (Does have a hard time falling asleep, but just wakes up not rested.). The patient is not nervous/anxious.  He seems somewhat down.  Borderline dysthymic.    I have reviewed and (if needed) personally updated the patient's problem list, medications, allergies, past medical and surgical history, social and family history.   PAST MEDICAL HISTORY   Past Medical History:  Diagnosis Date  . Carpal tunnel syndrome on left 12/2011  . Chronic nasal congestion   . Depression 01/19/2012  . GERD (gastroesophageal reflux disease)   . Gout   . History of Doppler ultrasound    a. Carotid US 3/11: no ICA stenosis   . History of echocardiogram    a. Echo 6/14: mod LVH, EF 50-55%, inf-lat HK, Gr 1 DD, mild LAE  . History of hiatal hernia   . Hx of CABG 09/04/2019   LIMA-LAD, SVG-OM, SVG-Dx, and an SVG-RPLA.  [name] Hyperlipidemia LDL goal < 70   . Hypertension    under control; has been on med. since 1996  . Multiple vessel coronary artery disease 1996, '02; '21   a. s/p Inf MI (age 15) >>PCI to RCA with tandem BMS (PS 1535); b. LHC 06/2000: pRCA 30-40%, prox/mid stents with 75% ISR, 30-40% at crux in RCA >> PCI: Cutting Balloon atherectomy for ISR; c. 2012 Myoview-nonischemic; d. 07/2019 -cath with progression of disease/LM disease => CABG x4  . Myocardial infarct, old 1996   PCI  . Sleep apnea    no testing yet  .  Ulcerative colitis ([place])     PAST SURGICAL HISTORY   Past Surgical History:  Procedure Laterality Date  . CARPAL TUNNEL RELEASE  01/26/2012   Procedure: CARPAL TUNNEL RELEASE;  Surgeon: Cammie Sickle., MD;  Location: [facility];  Service: Orthopedics;  Laterality: Left;  . CORONARY ANGIOPLASTY  07/11/2000   Cutting Balloon PTCA for RCA ISR  . CORONARY ARTERY BYPASS GRAFT N/A 09/04/2019   Procedure: CORONARY ARTERY BYPASS GRAFTING (CABG) using LIMA to LAD; Endoscopic Vein Harvest of Right Greater Saphenous: SVG to Diag1; SVG to OM1; SVG to PL.;  Surgeon: Gaye Pollack, MD;  Location: [facility];  Service: Open Heart Surgery;  Laterality: N/A;  . CORONARY STENT PLACEMENT  1996   [id number] BMS x 2 in RCA  . CYSTECTOMY  as a child   from neck  . ENDOVEIN HARVEST OF GREATER SAPHENOUS VEIN Right 09/04/2019   Procedure: ENDOVEIN HARVEST OF GREATER SAPHENOUS VEIN;  Surgeon: Gaye Pollack, MD;  Location: [facility];  Service: Open Heart Surgery;  Laterality: Right;  Endovein scope harvest of right upper and lower LE.  [name] HERNIA REPAIR    . LEFT HEART CATH AND CORONARY ANGIOGRAPHY N/A 08/22/2019   Procedure: LEFT HEART CATH AND CORONARY ANGIOGRAPHY;  Surgeon: Belva Crome, MD;  Location: [facility];;  Eccentric ostial 40 of 50% LM followed by 55-70% distal LM.  65% midLAD (heavily calcified), ostial D1 80%, LCx 70% between OM1 and OM2, RCA large with diffuse ISR prior PCI in 1996 and 2002-up to 90%.  PDA with competitive flow due to collateral flow from LCA.  EF 50-60%.  LVEDP 17   . NM MYOVIEW LTD  December 2012   Inferior infarct but no ischemia  . TEE WITHOUT CARDIOVERSION N/A 09/04/2019   Procedure: INTRAOPERATIVE TRANSESOPHAGEAL ECHOCARDIOGRAM (TEE);  Surgeon: Gaye Pollack, MD;  Location: [facility];  Service: Open Heart Surgery;  Intra-Op TEE: EF 50-55%.  Mild MR normal aortic, pulmonic and tricuspid valves. ->  Stable postop  . TONSILLECTOMY  as a child  . TRANSTHORACIC  ECHOCARDIOGRAM  08/2017   Normal echo-normal LV size and function.  EF 55-60%.  No R WMA.  No ASD/PFO.  No valve lesions.    Left Heart Cath-Coronaries (08/22/2019): Eccentric ostial 40 of 50% LM followed by 55-70% distal LM.  65% midLAD (heavily calcified), ostial D1 80%, LCx 70% between OM1 and OM2, RCA large with diffuse ISR prior PCI in 1996 and 2002-up to 90%.  PDA with competitive flow due to collateral flow from LCA.  EF 50-60%.  LVEDP 17 mmHg. Diagnostic- Dominance: Right    Carotid Dopplers 09/02/2019-relative normal  CABG x4 (09/04/2019): LIMA-LAD, SVG-OM, SVG-DX, SVG-RPLA (Dr. Cyndia Bent) ? Intra-Op TEE: EF 50-55%.  Mild MR normal aortic, pulmonic and tricuspid valves. ->  Stable postop   Immunization History  Administered Date(s) Administered  . Influenza,inj,Quad PF,6+ Mos 01/17/2013, 04/07/2015, 03/06/2017  . Pneumococcal Polysaccharide-23 01/17/2013    MEDICATIONS/ALLERGIES   Current Meds  Medication Sig  . acetaminophen (TYLENOL) 325 MG tablet Take 650 mg by mouth every 6 (six) hours as needed for moderate pain.  [name] allopurinol (ZYLOPRIM) 300 MG tablet TAKE 1 TABLET BY MOUTH ONCE DAILY.  [name] amLODipine (NORVASC) 2.5 MG tablet Take 1 tablet (2.5 mg total) by mouth daily.  [name] aspirin EC 81 MG tablet Take 1 tablet (81 mg total) by mouth daily.  . Cholecalciferol (VITAMIN D) 125 MCG (5000 UT) CAPS Take 5,000 Units by mouth daily.  . clopidogrel (PLAVIX) 75 MG tablet TAKE 1 TABLET BY MOUTH DAILY.  [name] colchicine 0.6 MG tablet as needed. For gout flares  . esomeprazole (NEXIUM) 40 MG capsule TAKE 1 CAPSULE BY MOUTH TWICE DAILY.  [name] ezetimibe (ZETIA) 10 MG tablet TAKE 1 TABLET (10 MG TOTAL) BY MOUTH DAILY.  [name] gabapentin (NEURONTIN) 300 MG capsule TAKE 1 (ONE) CAPSULE BY MOUTH THREE TIMES DAILY  . mesalamine (LIALDA) 1.2 g EC tablet TAKE 1 TABLET BY MOUTH FOUR TIMES DAILY FOR 30 DAYS.  [name] metoprolol tartrate (LOPRESSOR) 50 MG tablet TAKE 1 TABLET (50 MG TOTAL) BY MOUTH 2 (TWO) TIMES  DAILY.  . mometasone (ELOCON) 0.1 % cream USE AS DIRECTED 2 TIMES DAILY AS NEEDED  . nystatin (MYCOSTATIN) 100000 UNIT/ML suspension SWISH WITH 4 ML'S IN MOUTH/THROAT SPIT OUT OR SWALLOW 4 TIMES A DAY FOR 10 DAYS  . predniSONE (DELTASONE) 10 MG tablet TAKE 6 TABLETS BY MOUTH ON DAY 1 THEN DECREASE BY 1 TAB DAILY (6-5-4-3-2-1 TAPER DOSE)  . rosuvastatin (CRESTOR) 40 MG tablet TAKE 1 TABLET (40 MG TOTAL) BY MOUTH DAILY.  . Vilazodone HCl (VIIBRYD) 40 MG TABS TAKE 1 TABLET BY MOUTH WITH FOOD ONCE DAILY  . [DISCONTINUED] amLODipine (NORVASC) 5 MG tablet Take 0.5 tablets (2.5 mg total) by mouth daily.  . [DISCONTINUED] nitroGLYCERIN (NITROSTAT) 0.4 MG SL tablet as needed.    No Known Allergies  SOCIAL HISTORY/FAMILY HISTORY   Reviewed in Epic:  Pertinent findings:  Social History   Tobacco Use  . Smoking status: Former Smoker    Quit date: 03/27/1992    Years since quitting: 28.2  . Smokeless tobacco: Current User    Types: Snuff, Chew  Vaping Use  . Vaping Use: Never used  Substance Use Topics  . Alcohol use: No  . Drug use: No   Social History   Social History Narrative   He lives in [place], [place] near [place] with his wife. [place].  Near the former [address] Roll[address] F[address] takes houses his horses.   At baseline, he is extremely  active around [place], but does not have a routine exercise regimen.   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   He quit smoking in 1994.    OBJCTIVE -PE, EKG, labs   Wt Readings from Last 3 Encounters:  06/28/20 210 lb (95.3 kg)  05/14/20 218 lb 3.2 oz (99 kg)  11/26/19 204 lb (92.5 kg)    Physical Exam: BP 126/78 (BP Location: Left Arm, Patient Position: Sitting, Cuff Size: Large)   Pulse 79   Ht 5' 10.5" (1.791 m)   Wt 210 lb (95.3 kg)   BMI 29.71 kg/m  Physical Exam Vitals reviewed.  Constitutional:      General: He is not in acute distress.    Appearance: Normal appearance. He is not ill-appearing or toxic-appearing.  HENT:      Head: Normocephalic.  Neck:     Vascular: No carotid bruit or JVD.  Cardiovascular:     Rate and Rhythm: Normal rate and regular rhythm. Occasional extrasystoles are present.    Chest Wall: PMI is not displaced.     Pulses: Normal pulses.     Heart sounds: S1 normal and S2 normal. No murmur heard. No friction rub. No gallop.   Pulmonary:     Effort: Pulmonary effort is normal. No respiratory distress.     Breath sounds: Normal breath sounds.  Chest:     Chest wall: No tenderness.  Musculoskeletal:        General: No swelling. Normal range of motion.     Cervical back: Normal range of motion and neck supple.  Skin:    General: Skin is warm and dry.  Neurological:     General: No focal deficit present.     Mental Status: He is alert and oriented to person, place, and time.     Motor: No weakness.     Gait: Gait normal.  Psychiatric:        Mood and Affect: Mood normal.        Behavior: Behavior normal.        Thought Content: Thought content normal.        Judgment: Judgment normal.     Adult ECG Report Not checked  Recent Labs: Reviewed; LDL not at goal. Lab Results  Component Value Date   CHOL 139 04/23/2020   HDL 31 (L) 04/23/2020   LDLCALC 86 04/23/2020   TRIG 128 04/23/2020   CHOLHDL 4.5 04/23/2020   Lab Results  Component Value Date   CREATININE 1.35 (H) 05/14/2020   BUN 23 05/14/2020   NA 141 05/14/2020   K 4.2 05/14/2020   CL 104 05/14/2020   CO2 26 05/14/2020   CBC Latest Ref Rng & Units 03/23/2020 09/09/2019 09/08/2019  WBC 3.4 - 10.8 x10E3/uL 8.9 10.3 11.2(H)  Hemoglobin 13.0 - 17.7 g/dL 14.3 12.7(L) 12.1(L)  Hematocrit 37.5 - 51.0 % 46.3 38.6(L) 37.3(L)  Platelets 150 - 450 x10E3/uL 306 238 202    Lab Results  Component Value Date   TSH 2.652 06/14/2016    ==================================================  COVID-19 Education: The signs and symptoms of COVID-19 were discussed with the patient and how to seek care for testing (follow up  with PCP or arrange E-visit).   The importance of social distancing and COVID-19 vaccination was discussed today. The patient is practicing social distancing & Masking.   I spent a total of 41mnutes with the patient spent in direct patient consultation.  Additional time spent with chart review  / charting (studies, outside  notes, etc): 15 min Total Time: 75 min   Current medicines are reviewed at length with the patient today.  (+/- concerns) NA  This visit occurred during the SARS-CoV-2 public health emergency.  Safety protocols were in place, including screening questions prior to the visit, additional usage of staff PPE, and extensive cleaning of exam room while observing appropriate contact time as indicated for disinfecting solutions.  Notice: This dictation was prepared with Dragon dictation along with smaller phrase technology. Any transcriptional errors that result from this process are unintentional and may not be corrected upon review.  Patient Instructions / Medication Changes & Studies & Tests Ordered   Patient Instructions  Medication Instructions:  No medication changes  *If you need a refill on your cardiac medications before your next appointment, please call your pharmacy*   Lab Work: Cbc cmp Lipid  If you have labs (blood work) drawn today and your tests are completely normal, you will receive your results only by: [name] MyChart Message (if you have MyChart) OR . A paper copy in the mail If you have any lab test that is abnormal or we need to change your treatment, we will call you to review the results.   Testing/Procedures: will be schedule at  [facility] office , [address] office or Hospital;  You will need a COVID-19  Test 3 days  prior to your procedure and self isolate. in between the test and procedure. . This is a Drive Up Visit at [address] [place], [place] [address]. Someone will direct you to the appropriate testing line. Stay in your car and  someone will be with you shortly.   Your physician has requested that you have a lexiscan myoview. Please follow instruction sheet, as given.   Follow-Up: At [facility], you and your health needs are our priority.  As part of our continuing mission to provide you with exceptional heart care, we have created designated Provider Care Teams.  These Care Teams include your primary Cardiologist (physician) and Advanced Practice Providers (APPs -  Physician Assistants and Nurse Practitioners) who all work together to provide you with the care you need, when you need it.  We recommend signing up for the patient portal called "MyChart".  Sign up information is provided on this After Visit Summary.  MyChart is used to connect with patients for Virtual Visits (Telemedicine).  Patients are able to view lab/test results, encounter notes, upcoming appointments, etc.  Non-urgent messages can be sent to your provider as well.   To learn more about what you can do with MyChart, go to [email or web address].    Your next appointment:   2 month(s)  The format for your next appointment:   In Person  Provider:   Glenetta Hew, MD      Studies Ordered:   Orders Placed This Encounter  Procedures  . Lipid panel  . Comprehensive metabolic panel  . CBC  . Cardiac Stress Test: Informed Consent Details: Physician/Practitioner Attestation; Transcribe to consent form and obtain patient signature  . MYOCARDIAL PERFUSION IMAGING     Glenetta Hew, M.D., M.S. Interventional Cardiologist   Pager # [phone] Phone # [phone] [address] [place], [place] [address]   Thank you for choosing Heartcare at [facility]!!

## 2020-06-29 ENCOUNTER — Encounter: Payer: Self-pay | Admitting: Cardiology

## 2020-06-29 ENCOUNTER — Other Ambulatory Visit (HOSPITAL_COMMUNITY): Payer: Self-pay

## 2020-06-29 ENCOUNTER — Telehealth: Payer: Self-pay | Admitting: Cardiology

## 2020-06-29 NOTE — Telephone Encounter (Signed)
Spoke with patient regarding the 7:45 am Lexiscan Myoview on  Friday 07/02/20 at the [facility] location (Dr. Allison Quarry office).  Patient also scheduled for a 2 month follow up on Friday 09/17/20 at 3:40pm   Will mail information to patient and he voiced his understanding.

## 2020-06-30 ENCOUNTER — Telehealth (HOSPITAL_COMMUNITY): Payer: Self-pay

## 2020-06-30 NOTE — Telephone Encounter (Signed)
Close encounter 

## 2020-07-01 ENCOUNTER — Encounter: Payer: Self-pay | Admitting: Cardiology

## 2020-07-01 DIAGNOSIS — G4730 Sleep apnea, unspecified: Secondary | ICD-10-CM | POA: Insufficient documentation

## 2020-07-01 NOTE — Assessment & Plan Note (Signed)
Interestingly, LDL in August of last year was 67, and has gone up to 62 as of this January.  Would probably need to address once the more urgent evaluation of potential ischemia is complete.  I had hoped to potentially stop Zetia, however I do not think that they will be possible.  May need to actually add additional medication.  He does indicate that he had had some indiscretion his diet.

## 2020-07-01 NOTE — Assessment & Plan Note (Signed)
Zio patch was grossly noncontributory.  Nothing significant.  Continue beta-blocker.

## 2020-07-01 NOTE — Assessment & Plan Note (Signed)
Less prominent.  We had stopped his ACE inhibitor/ARB: Additional blood pressure control.  He is on amlodipine.  Hemoglobin 3.  Think we may be an increase if necessary.

## 2020-07-01 NOTE — Assessment & Plan Note (Signed)
Significantly elevated Epworth scale with daytime fatigue.  I wonder if this is probably depression symptoms.  Following ischemic evaluation, plan check polysomnogram.  Apparently had had a polysomnogram the past but never followed up.

## 2020-07-01 NOTE — Assessment & Plan Note (Signed)
Prior inferior infarct noted in 2012.  Preserved EF.  He has not had another Myoview since then, will be looking for new ischemia on upcoming Myoview.

## 2020-07-01 NOTE — Assessment & Plan Note (Signed)
Multivessel disease status post CABG x410 months ago for recurrent anginal symptoms.  Now having some no unusual symptoms of not quite consistent with his previous angina, however still concerning.  Plan:   Continue aspirin and Plavix along with metoprolol has been repeated.  Continue rosuvastatin and Zetia. (We converted him from simvastatin) -  Lexiscan Myoview for ischemic evaluation.  Low threshold to consider invasive evaluation.

## 2020-07-01 NOTE — Assessment & Plan Note (Addendum)
Some difficult other atypical features with his chest discomfort and dyspnea.  Mostly seems exercise intolerance.  He I think a lot of his symptoms may be related to OSA and poor sleep with fatigue.  However I am concerned with him being this close to his CABG that could be a mechanical issue with his grafts.  Plan: Lexiscan Myoview-low threshold to relook cath.  For now, continue current dose of amlodipine Lopressor.  Anginal benefit.  Low threshold to consider increasing back to 5 mg amlodipine.  Shared Decision Making/Informed Consent{ The risks [chest pain, shortness of breath, cardiac arrhythmias, dizziness, blood pressure fluctuations, myocardial infarction, stroke/transient ischemic attack, nausea, vomiting, allergic reaction, radiation exposure, metallic taste sensation and life-threatening complications (estimated to be 1 in 10,000)], benefits (risk stratification, diagnosing coronary artery disease, treatment guidance) and alternatives of a nuclear stress test were discussed in detail with Daniel Buck and he agrees to proceed.

## 2020-07-01 NOTE — Assessment & Plan Note (Signed)
Stable blood pressure.  On low-dose amlodipine and moderate-dose metoprolol tartrate (Lopressor).  Palpitations well controlled.  May need to increase amlodipine to 5 mg for additional antianginal benefit.

## 2020-07-01 NOTE — Assessment & Plan Note (Signed)
Still has fatigue which seems to be worse than it has been and some atypical anginal symptoms.  Plan:: Myoview for ischemic evaluation.

## 2020-07-02 ENCOUNTER — Other Ambulatory Visit: Payer: Self-pay

## 2020-07-02 ENCOUNTER — Ambulatory Visit (HOSPITAL_COMMUNITY)
Admission: RE | Admit: 2020-07-02 | Discharge: 2020-07-02 | Disposition: A | Discharge status: Home / Self Care | Payer: 59 | Source: Ambulatory Visit | Attending: Cardiovascular Disease | Admitting: Cardiovascular Disease

## 2020-07-02 DIAGNOSIS — I208 Other forms of angina pectoris: Secondary | ICD-10-CM | POA: Diagnosis not present

## 2020-07-02 DIAGNOSIS — I25119 Atherosclerotic heart disease of native coronary artery with unspecified angina pectoris: Secondary | ICD-10-CM | POA: Diagnosis not present

## 2020-07-02 DIAGNOSIS — Z951 Presence of aortocoronary bypass graft: Secondary | ICD-10-CM | POA: Diagnosis not present

## 2020-07-02 DIAGNOSIS — R002 Palpitations: Secondary | ICD-10-CM | POA: Insufficient documentation

## 2020-07-02 DIAGNOSIS — I10 Essential (primary) hypertension: Secondary | ICD-10-CM | POA: Diagnosis not present

## 2020-07-02 DIAGNOSIS — I252 Old myocardial infarction: Secondary | ICD-10-CM | POA: Diagnosis not present

## 2020-07-02 DIAGNOSIS — E785 Hyperlipidemia, unspecified: Secondary | ICD-10-CM | POA: Diagnosis not present

## 2020-07-02 HISTORY — PX: NM MYOVIEW LTD: HXRAD82

## 2020-07-02 LAB — LIPID PANEL
Chol/HDL Ratio: 4.7 ratio (ref 0.0–5.0)
Cholesterol, Total: 128 mg/dL (ref 100–199)
HDL: 27 mg/dL — ABNORMAL LOW (ref >39)
LDL Chol Calc (NIH): 81 mg/dL (ref 0–99)
Triglycerides: 109 mg/dL (ref 0–149)
VLDL Cholesterol Cal: 20 mg/dL (ref 5–40)

## 2020-07-02 LAB — CBC
Hematocrit: 46.8 % (ref 37.5–51.0)
Hemoglobin: 14.8 g/dL (ref 13.0–17.7)
MCH: 25.6 pg — ABNORMAL LOW (ref 26.6–33.0)
MCHC: 31.6 g/dL (ref 31.5–35.7)
MCV: 81 fL (ref 79–97)
Platelets: 254 10*3/uL (ref 150–450)
RBC: 5.77 x10E6/uL (ref 4.14–5.80)
RDW: 17.2 % — ABNORMAL HIGH (ref 11.6–15.4)
WBC: 9 10*3/uL (ref 3.4–10.8)

## 2020-07-02 LAB — COMPREHENSIVE METABOLIC PANEL
ALT: 23 IU/L (ref 0–44)
AST: 38 IU/L (ref 0–40)
Albumin/Globulin Ratio: 1.8 (ref 1.2–2.2)
Albumin: 4.7 g/dL (ref 3.8–4.9)
Alkaline Phosphatase: 111 IU/L (ref 44–121)
BUN/Creatinine Ratio: 13 (ref 9–20)
BUN: 18 mg/dL (ref 6–24)
Bilirubin Total: 0.4 mg/dL (ref 0.0–1.2)
CO2: 24 mmol/L (ref 20–29)
Calcium: 9.7 mg/dL (ref 8.7–10.2)
Chloride: 101 mmol/L (ref 96–106)
Creatinine, Ser: 1.39 mg/dL — ABNORMAL HIGH (ref 0.76–1.27)
Globulin, Total: 2.6 g/dL (ref 1.5–4.5)
Glucose: 106 mg/dL — ABNORMAL HIGH (ref 65–99)
Potassium: 4.9 mmol/L (ref 3.5–5.2)
Sodium: 140 mmol/L (ref 134–144)
Total Protein: 7.3 g/dL (ref 6.0–8.5)
eGFR: 60 mL/min/{1.73_m2} (ref >59)

## 2020-07-02 LAB — MYOCARDIAL PERFUSION IMAGING
LV dias vol: 161 mL (ref 62–150)
LV sys vol: 96 mL
Peak HR: 96 {beats}/min
Rest HR: 64 {beats}/min
SDS: 2
SRS: 1
SSS: 3
TID: 1

## 2020-07-02 MED ORDER — REGADENOSON 0.4 MG/5ML IV SOLN
0.4000 mg | Freq: Once | INTRAVENOUS | Status: AC
  Administered 2020-07-02: 0.4 mg via INTRAVENOUS

## 2020-07-02 MED ORDER — TECHNETIUM TC 99M TETROFOSMIN IV KIT
10.0000 | PACK | Freq: Once | INTRAVENOUS | Status: AC | PRN
  Administered 2020-07-02: 10 via INTRAVENOUS
  Filled 2020-07-02: qty 10

## 2020-07-02 MED ORDER — AMINOPHYLLINE 25 MG/ML IV SOLN
75.0000 mg | Freq: Once | INTRAVENOUS | Status: AC
  Administered 2020-07-02: 75 mg via INTRAVENOUS

## 2020-07-02 MED ORDER — TECHNETIUM TC 99M TETROFOSMIN IV KIT
30.4000 | PACK | Freq: Once | INTRAVENOUS | Status: AC | PRN
  Administered 2020-07-02: 30.4 via INTRAVENOUS
  Filled 2020-07-02: qty 31

## 2020-07-04 MED FILL — Metoprolol Tartrate Tab 50 MG: ORAL | 90 days supply | Qty: 180 | Fill #0 | Status: AC

## 2020-07-05 ENCOUNTER — Other Ambulatory Visit: Payer: Self-pay

## 2020-07-05 ENCOUNTER — Telehealth: Payer: Self-pay | Admitting: Cardiology

## 2020-07-05 ENCOUNTER — Other Ambulatory Visit (HOSPITAL_COMMUNITY): Payer: Self-pay

## 2020-07-05 MED ORDER — ROSUVASTATIN CALCIUM 40 MG PO TABS
40.0000 mg | ORAL_TABLET | Freq: Every day | ORAL | 3 refills | Status: DC
  Filled 2020-07-05: qty 90, 90d supply, fill #0
  Filled 2020-10-03: qty 90, 90d supply, fill #1

## 2020-07-05 MED FILL — Allopurinol Tab 300 MG: ORAL | 30 days supply | Qty: 30 | Fill #0 | Status: AC

## 2020-07-05 NOTE — Telephone Encounter (Signed)
Spoke wife. Discuss doing cardiac cath Per Dr Ellyn Hack, covid test- for Tuesday 07/06/20 Wednesday - set up cardiac cath  07/07/20. Sent instruction through [employer].  [name] wife verbalized understanding

## 2020-07-05 NOTE — Telephone Encounter (Signed)
Spoke with pt's wife Lenna Sciara (ok per DPR), pt and wife concerned about having heart cath done this week. Wife states that Dr. Ellyn Hack told the pt that if there was an abnormal result that we would set the heart cath up for this week. Explained to wife that Dr. Ellyn Hack and his nurse have been in clinic today and have not had the opportunity to get back to [place] regarding heart cath.  Will route to Dr. Ellyn Hack and his nurse to further advise.

## 2020-07-05 NOTE — Telephone Encounter (Signed)
Pt wife called in and is upset that she has not heard back about sching the Heart Cath.  She stated he was suppose to have the heart Cath this week.  She would like a call back as soon as possible to find out what is going on with sching the heart Cath  Best number [phone]

## 2020-07-06 ENCOUNTER — Other Ambulatory Visit (HOSPITAL_COMMUNITY): Payer: Self-pay

## 2020-07-06 ENCOUNTER — Other Ambulatory Visit (HOSPITAL_COMMUNITY)
Admission: RE | Admit: 2020-07-06 | Discharge: 2020-07-06 | Disposition: A | Discharge status: Home / Self Care | Payer: 59 | Source: Ambulatory Visit | Attending: Cardiology | Admitting: Cardiology

## 2020-07-06 ENCOUNTER — Telehealth: Payer: Self-pay | Admitting: Cardiology

## 2020-07-06 ENCOUNTER — Telehealth: Payer: Self-pay | Admitting: *Deleted

## 2020-07-06 ENCOUNTER — Other Ambulatory Visit: Payer: Self-pay | Admitting: *Deleted

## 2020-07-06 DIAGNOSIS — R9439 Abnormal result of other cardiovascular function study: Secondary | ICD-10-CM | POA: Diagnosis present

## 2020-07-06 DIAGNOSIS — Z20822 Contact with and (suspected) exposure to covid-19: Secondary | ICD-10-CM | POA: Insufficient documentation

## 2020-07-06 DIAGNOSIS — I252 Old myocardial infarction: Secondary | ICD-10-CM

## 2020-07-06 DIAGNOSIS — I208 Other forms of angina pectoris: Secondary | ICD-10-CM

## 2020-07-06 DIAGNOSIS — I25119 Atherosclerotic heart disease of native coronary artery with unspecified angina pectoris: Secondary | ICD-10-CM

## 2020-07-06 DIAGNOSIS — Z01812 Encounter for preprocedural laboratory examination: Secondary | ICD-10-CM | POA: Diagnosis not present

## 2020-07-06 DIAGNOSIS — Z951 Presence of aortocoronary bypass graft: Secondary | ICD-10-CM

## 2020-07-06 LAB — SARS CORONAVIRUS 2 (TAT 6-24 HRS): SARS Coronavirus 2: NEGATIVE

## 2020-07-06 NOTE — Telephone Encounter (Signed)
Pt contacted pre-catheterization scheduled at [facility] for: Wednesday July 07, 2020 1 PM Verified arrival time and place: [facility]) at: 11 AM   No solid food after midnight prior to cath, clear liquids until 5 AM day of procedure.  AM meds can be  taken pre-cath with sips of water including: ASA 81 mg Plavix 75 mg   Confirmed patient has responsible adult to drive home post procedure and be with patient first 24 hours after arriving home: yes  You are allowed ONE visitor in the waiting room during the time you are at the hospital for your procedure. Both you and your visitor must wear a mask once you enter the hospital.   Reviewed procedure/mask/visitor instructions with patient's wife (DPR).

## 2020-07-06 NOTE — Telephone Encounter (Signed)
    Mr. Nidiffer was notified of the results of his stress test. While there was no evidence of reversible perfusion defect explain ischemia, there was suggestion of significant reduced ejection fraction compared to his previous evaluation.  In the setting of his concerning symptoms, we decided the best course of action was to proceed with cardiac catheterization and possible PCI.  Problem List Items Addressed This Visit    Abnormal nuclear stress test   Atypical angina (HCC) - Primary   Myocardial infarct, old (Chronic)   Coronary artery disease involving native coronary artery of native heart with angina pectoris (HCC) (Chronic)      He was contacted and schedule for cardiac catheterization on 07/08/2018'  Performing MD:  Glenetta Hew, M.D., M.S. Procedure:  LEFT HEART CATHETERIZATION WITH NATIVE CORONARY AND GRAFT ANGIOGRAPHY AND POSSIBLE PERCUTANEOUS CORONARY INTERVENTION  The procedure with Risks/Benefits/Alternatives and Indications was reviewed with the patient and wife.  All questions were answered.    Risks / Complications include, but not limited to: Death, MI, CVA/TIA, VF/VT (with defibrillation), Bradycardia (need for temporary pacer placement), contrast induced nephropathy, bleeding / bruising / hematoma / pseudoaneurysm, vascular or coronary injury (with possible emergent CT or Vascular Surgery), adverse medication reactions, infection.  Additional risks involving the use of radiation with the possibility of radiation burns and cancer were explained in detail.  The patient (and family) voice understanding and agree to proceed.     Glenetta Hew, MD

## 2020-07-06 NOTE — Telephone Encounter (Signed)
Spoke to patient 's wife 07/05/20- left heart cath  Scheduled for 07/07/20. Instructions given and written instruction sent via mychart.  schedule covid test.  wife verbalized understanding

## 2020-07-07 ENCOUNTER — Ambulatory Visit (HOSPITAL_COMMUNITY)
Admission: RE | Admit: 2020-07-07 | Discharge: 2020-07-07 | Disposition: A | Discharge status: Home / Self Care | Payer: 59 | Attending: Cardiology | Admitting: Cardiology

## 2020-07-07 ENCOUNTER — Other Ambulatory Visit: Payer: Self-pay

## 2020-07-07 ENCOUNTER — Encounter (HOSPITAL_COMMUNITY)
Admission: RE | Disposition: A | Discharge status: Home / Self Care | Payer: Self-pay | Source: Home / Self Care | Attending: Cardiology

## 2020-07-07 DIAGNOSIS — Z79899 Other long term (current) drug therapy: Secondary | ICD-10-CM | POA: Insufficient documentation

## 2020-07-07 DIAGNOSIS — I2582 Chronic total occlusion of coronary artery: Secondary | ICD-10-CM | POA: Insufficient documentation

## 2020-07-07 DIAGNOSIS — I25708 Atherosclerosis of coronary artery bypass graft(s), unspecified, with other forms of angina pectoris: Secondary | ICD-10-CM | POA: Diagnosis not present

## 2020-07-07 DIAGNOSIS — I25709 Atherosclerosis of coronary artery bypass graft(s), unspecified, with unspecified angina pectoris: Secondary | ICD-10-CM | POA: Insufficient documentation

## 2020-07-07 DIAGNOSIS — I2510 Atherosclerotic heart disease of native coronary artery without angina pectoris: Secondary | ICD-10-CM

## 2020-07-07 DIAGNOSIS — E785 Hyperlipidemia, unspecified: Secondary | ICD-10-CM | POA: Insufficient documentation

## 2020-07-07 DIAGNOSIS — I25118 Atherosclerotic heart disease of native coronary artery with other forms of angina pectoris: Secondary | ICD-10-CM

## 2020-07-07 DIAGNOSIS — R0609 Other forms of dyspnea: Secondary | ICD-10-CM | POA: Diagnosis not present

## 2020-07-07 DIAGNOSIS — I10 Essential (primary) hypertension: Secondary | ICD-10-CM | POA: Diagnosis not present

## 2020-07-07 DIAGNOSIS — I252 Old myocardial infarction: Secondary | ICD-10-CM

## 2020-07-07 DIAGNOSIS — Z955 Presence of coronary angioplasty implant and graft: Secondary | ICD-10-CM | POA: Insufficient documentation

## 2020-07-07 DIAGNOSIS — Z87891 Personal history of nicotine dependence: Secondary | ICD-10-CM | POA: Diagnosis not present

## 2020-07-07 DIAGNOSIS — Z7902 Long term (current) use of antithrombotics/antiplatelets: Secondary | ICD-10-CM | POA: Insufficient documentation

## 2020-07-07 DIAGNOSIS — R9439 Abnormal result of other cardiovascular function study: Secondary | ICD-10-CM | POA: Diagnosis present

## 2020-07-07 DIAGNOSIS — Z7982 Long term (current) use of aspirin: Secondary | ICD-10-CM | POA: Diagnosis not present

## 2020-07-07 DIAGNOSIS — I208 Other forms of angina pectoris: Secondary | ICD-10-CM

## 2020-07-07 DIAGNOSIS — I25718 Atherosclerosis of autologous vein coronary artery bypass graft(s) with other forms of angina pectoris: Secondary | ICD-10-CM

## 2020-07-07 DIAGNOSIS — Z951 Presence of aortocoronary bypass graft: Secondary | ICD-10-CM

## 2020-07-07 DIAGNOSIS — I25119 Atherosclerotic heart disease of native coronary artery with unspecified angina pectoris: Secondary | ICD-10-CM | POA: Diagnosis present

## 2020-07-07 HISTORY — PX: LEFT HEART CATH AND CORS/GRAFTS ANGIOGRAPHY: CATH118250

## 2020-07-07 SURGERY — LEFT HEART CATH AND CORS/GRAFTS ANGIOGRAPHY
Anesthesia: LOCAL

## 2020-07-07 MED ORDER — ONDANSETRON HCL 4 MG/2ML IJ SOLN: 4.0000 mg | Freq: Four times a day (QID) | INTRAMUSCULAR | Status: DC | PRN

## 2020-07-07 MED ORDER — SODIUM CHLORIDE 0.9% FLUSH: 3.0000 mL | Freq: Two times a day (BID) | INTRAVENOUS | Status: DC

## 2020-07-07 MED ORDER — SODIUM CHLORIDE 0.9 % WEIGHT BASED INFUSION: 1.0000 mL/kg/h | INTRAVENOUS | Status: DC

## 2020-07-07 MED ORDER — NITROGLYCERIN 0.4 MG SL SUBL
SUBLINGUAL_TABLET | SUBLINGUAL | Status: AC
  Administered 2020-07-07: 0.4 mg via SUBLINGUAL
  Filled 2020-07-07: qty 1

## 2020-07-07 MED ORDER — SODIUM CHLORIDE 0.9 % WEIGHT BASED INFUSION
3.0000 mL/kg/h | INTRAVENOUS | Status: AC
  Administered 2020-07-07: 3 mL/kg/h via INTRAVENOUS

## 2020-07-07 MED ORDER — SODIUM CHLORIDE 0.9% FLUSH: 3.0000 mL | INTRAVENOUS | Status: DC | PRN

## 2020-07-07 MED ORDER — VERAPAMIL HCL 2.5 MG/ML IV SOLN
INTRAVENOUS | Status: AC
  Filled 2020-07-07: qty 2

## 2020-07-07 MED ORDER — LIDOCAINE HCL (PF) 1 % IJ SOLN
INTRAMUSCULAR | Status: AC
  Filled 2020-07-07: qty 30

## 2020-07-07 MED ORDER — ACETAMINOPHEN 325 MG PO TABS: 650.0000 mg | ORAL_TABLET | ORAL | Status: DC | PRN

## 2020-07-07 MED ORDER — IOHEXOL 350 MG/ML SOLN
INTRAVENOUS | Status: DC | PRN
  Administered 2020-07-07: 130 mL

## 2020-07-07 MED ORDER — SODIUM CHLORIDE 0.9 % IV SOLN: 250.0000 mL | INTRAVENOUS | Status: DC | PRN

## 2020-07-07 MED ORDER — SODIUM CHLORIDE 0.9 % IV SOLN: INTRAVENOUS | Status: DC

## 2020-07-07 MED ORDER — FENTANYL CITRATE (PF) 100 MCG/2ML IJ SOLN
INTRAMUSCULAR | Status: DC | PRN
  Administered 2020-07-07: 50 ug via INTRAVENOUS

## 2020-07-07 MED ORDER — LIDOCAINE HCL (PF) 1 % IJ SOLN
INTRAMUSCULAR | Status: DC | PRN
Start: 2020-07-07 — End: 2020-07-07
  Administered 2020-07-07: 2 mL

## 2020-07-07 MED ORDER — MIDAZOLAM HCL 2 MG/2ML IJ SOLN
INTRAMUSCULAR | Status: DC | PRN
Start: 2020-07-07 — End: 2020-07-07
  Administered 2020-07-07: 2 mg via INTRAVENOUS

## 2020-07-07 MED ORDER — VERAPAMIL HCL 2.5 MG/ML IV SOLN
INTRAVENOUS | Status: DC | PRN
  Administered 2020-07-07: 10 mL via INTRA_ARTERIAL

## 2020-07-07 MED ORDER — NITROGLYCERIN 0.4 MG SL SUBL
0.4000 mg | SUBLINGUAL_TABLET | SUBLINGUAL | Status: DC | PRN
  Administered 2020-07-07: 0.4 mg via SUBLINGUAL

## 2020-07-07 MED ORDER — HEPARIN SODIUM (PORCINE) 1000 UNIT/ML IJ SOLN
INTRAMUSCULAR | Status: DC | PRN
  Administered 2020-07-07: 5000 [IU] via INTRAVENOUS

## 2020-07-07 MED ORDER — MIDAZOLAM HCL 2 MG/2ML IJ SOLN
INTRAMUSCULAR | Status: AC
  Filled 2020-07-07: qty 2

## 2020-07-07 MED ORDER — HEPARIN SODIUM (PORCINE) 1000 UNIT/ML IJ SOLN
INTRAMUSCULAR | Status: AC
  Filled 2020-07-07: qty 1

## 2020-07-07 MED ORDER — HEPARIN (PORCINE) IN NACL 1000-0.9 UT/500ML-% IV SOLN
INTRAVENOUS | Status: AC
  Filled 2020-07-07: qty 1000

## 2020-07-07 MED ORDER — HYDRALAZINE HCL 20 MG/ML IJ SOLN: 10.0000 mg | INTRAMUSCULAR | Status: DC | PRN

## 2020-07-07 MED ORDER — ASPIRIN 81 MG PO CHEW: 81.0000 mg | CHEWABLE_TABLET | Freq: Once | ORAL | Status: DC

## 2020-07-07 MED ORDER — RANOLAZINE ER 500 MG PO TB12
500.0000 mg | ORAL_TABLET | Freq: Two times a day (BID) | ORAL | 3 refills | Status: DC
  Filled 2020-08-05: qty 60, 30d supply, fill #0

## 2020-07-07 MED ORDER — NITROGLYCERIN 0.4 MG SL SUBL
SUBLINGUAL_TABLET | SUBLINGUAL | Status: AC
  Filled 2020-07-07: qty 1

## 2020-07-07 MED ORDER — LABETALOL HCL 5 MG/ML IV SOLN: 10.0000 mg | INTRAVENOUS | Status: DC | PRN

## 2020-07-07 MED ORDER — FENTANYL CITRATE (PF) 100 MCG/2ML IJ SOLN
INTRAMUSCULAR | Status: AC
  Filled 2020-07-07: qty 2

## 2020-07-07 MED ORDER — HEPARIN (PORCINE) IN NACL 1000-0.9 UT/500ML-% IV SOLN
INTRAVENOUS | Status: DC | PRN
  Administered 2020-07-07 (×2): 500 mL

## 2020-07-07 SURGICAL SUPPLY — 13 items
CATH INFINITI 5 FR IM (CATHETERS) ×2 IMPLANT
CATH INFINITI 5 FR MPA2 (CATHETERS) ×1 IMPLANT
CATH INFINITI 5FR AL1 (CATHETERS) ×1 IMPLANT
CATH INFINITI 5FR MULTPACK ANG (CATHETERS) ×1 IMPLANT
DEVICE RAD COMP TR BAND LRG (VASCULAR PRODUCTS) ×1 IMPLANT
GLIDESHEATH SLEND SS 6F .021 (SHEATH) ×1 IMPLANT
GUIDEWIRE INQWIRE 1.5J.035X260 (WIRE) IMPLANT
INQWIRE 1.5J .035X260CM (WIRE) ×2
KIT HEART LEFT (KITS) ×2 IMPLANT
PACK CARDIAC CATHETERIZATION (CUSTOM PROCEDURE TRAY) ×2 IMPLANT
SHEATH PROBE COVER 6X72 (BAG) ×1 IMPLANT
TRANSDUCER W/STOPCOCK (MISCELLANEOUS) ×2 IMPLANT
TUBING CIL FLEX 10 FLL-RA (TUBING) ×2 IMPLANT

## 2020-07-07 NOTE — Brief Op Note (Signed)
07/07/2020  5:17 PM  SURGEON:  Surgeon(s) and Role:    * [name], MD - Primary  PATIENT:  Daniel Buck  55 y.o. male with a PMH notable for early onset CAD (inferior STEMI 1996-BMS PCI RCA, atherectomy and PCI for ISR 2002), CABG x4 June 2021, along with HTN, HLD and Ulcerative Colitis (on Humira) who was seen recently for 22-monthfollow-up November 26, 2019 with complaints of progressively worsening chest pain symptoms concerning for progressive angina.  He underwent nuclear stress testing which did not show any ischemia, but did show notably reduced EF from prior evaluation.  Therefore, with his continued symptoms and abnormal nuclear stress test results, we chose to proceed with cardiac catheterization.  PRE-OPERATIVE DIAGNOSIS:  abnormal nuc; multivessel CAD-CABG; prior inferior STEMI  POST-OPERATIVE DIAGNOSIS:  Severe native vessel disease: 100% % occluded RCA (new), 100% proximal-mid LCx after OM1, stable ostial/proximal LM 50% and distal LM 60 to 65%; ostial D1 likely 70% stenosis, mid LAD 65% stenosis. Patent LIMA-LAD with competitive flow Patent SVG-OM2 w ithout competitive flow Patent SVG-RPL without competitive flow. Occluded SVG-Diagonal -> new, likely culprit lesion. EF appears to be more like 45 to 50% with likely inferolateral hypokinesis. Normal EDP.  PROCEDURE:  Procedure(s): LEFT HEART CATH AND CORS/GRAFTS ANGIOGRAPHY (N/A)  Time Out: Verified patient identification, verified procedure, site/side was marked, verified correct patient position, special equipment/implants available, medications/allergies/relevent history reviewed, required imaging and test results available. Performed.  Access:  . LEFT Radial Artery: 6 Fr sheath -- Seldinger technique using Micropuncture Kit -- Direct ultrasound guidance used.  Permanent image obtained and placed on chart. -- 10 mL radial cocktail IA; 5000 Units IV Heparin  Left Heart Catheterization: 5Fr Catheters advanced or  exchanged over a J-wire under direct fluoroscopic guidance into the ascending aorta; JR4 catheter advanced first.  . * LV Hemodynamics (LV Gram): JR4 catheter . * Left Coronary Artery Cineangiography: JL4 catheter  . * Right Coronary Artery & SVG-Diagonal  Cineangiography: JR4 catheter  . SVG-OM2  Cineangiography: AL-1 catheter  . SVG-RCA cineangiography: MPA 2 catheter  . * LIMA-LAD Cineangiography: JR4 catheter was redirected into Left Subclavian Artery & exchanged over long-exchange wire for IMA catheter.  Review of initial angiography revealed: Likely culprit lesion is now occluded SVG-diagonal  Preparations are made for medical management  Upon completion of Angiogaphy, the catheter was removed completely out of the body over a wire, without complication.   Radial sheath removed in the Cardiac Catheterization lab with TR Band placed for hemostasis.  TR Band: Applied for hemostasis  MEDICATIONS . SQ Lidocaine 3 mL . Radial Cocktail: 3 mg Verapmil in 10 mL NS . Heparin: 5000 units   ANESTHESIA:   local and IV sedation; 2 mg IV Versed, 50 mcg IV fentanyl  EBL:  < 229m  BLOOD ADMINISTERED:none COUNTS:  YES  DICTATION: .Note written in EPMallardDischarge to home after PACU   Will initiate Ranexa 500 mg twice daily  PATIENT DISPOSITION:  PACU - hemodynamically stable.   Delay start of Pharmacological VTE agent (>24hrs) due to surgical blood loss or risk of bleeding: not applicable   DaGlenetta HewMD

## 2020-07-07 NOTE — Progress Notes (Signed)
Ria Comment PA/ cards called, pt having CP, EKG being done, Nitro given , cardiac monitoring, she is aware and will come.

## 2020-07-07 NOTE — Progress Notes (Signed)
Client c/o pain anterior neck and states , "this is the same pain I had in 1996 with my heart attack and my EKG didn't show anything"

## 2020-07-07 NOTE — Progress Notes (Signed)
Client states pain decreased to almost zero and states does not want to take another ntg

## 2020-07-07 NOTE — Discharge Instructions (Signed)
Radial Site Care  This sheet gives you information about how to care for yourself after your procedure. Your health care provider may also give you more specific instructions. If you have problems or questions, contact your health care provider. What can I expect after the procedure? After the procedure, it is common to have:  Bruising and tenderness at the catheter insertion area. Follow these instructions at home: Medicines  Take over-the-counter and prescription medicines only as told by your health care provider. Insertion site care 1. Follow instructions from your health care provider about how to take care of your insertion site. Make sure you: ? Wash your hands with soap and water before you remove your bandage (dressing). If soap and water are not available, use hand sanitizer. ? May remove dressing in 24 hours. 2. Check your insertion site every day for signs of infection. Check for: ? Redness, swelling, or pain. ? Fluid or blood. ? Pus or a bad smell. ? Warmth. 3. Do no take baths, swim, or use a hot tub for 5 days. 4. You may shower 24-48 hours after the procedure. ? Remove the dressing and gently wash the site with plain soap and water. ? Pat the area dry with a clean towel. ? Do not rub the site. That could cause bleeding. 5. Do not apply powder or lotion to the site. Activity  1. For 24 hours after the procedure, or as directed by your health care provider: ? Do not flex or bend the affected arm. ? Do not push or pull heavy objects with the affected arm. ? Do not drive yourself home from the hospital or clinic. You may drive 24 hours after the procedure. ? Do not operate machinery or power tools. ? KEEP ARM ELEVATED THE REMAINDER OF THE DAY. 2. Do not push, pull or lift anything that is heavier than 10 lb for 5 days. 3. Ask your health care provider when it is okay to: ? Return to work or school. ? Resume usual physical activities or sports. ? Resume sexual  activity. General instructions  If the catheter site starts to bleed, raise your arm and put firm pressure on the site. If the bleeding does not stop, get help right away. This is a medical emergency.  DRINK PLENTY OF FLUIDS FOR THE NEXT 2-3 DAYS.  No alcohol consumption for 24 hours after receiving sedation.  If you went home on the same day as your procedure, a responsible adult should be with you for the first 24 hours after you arrive home.  Keep all follow-up visits as told by your health care provider. This is important. Contact a health care provider if:  You have a fever.  You have redness, swelling, or yellow drainage around your insertion site. Get help right away if:  You have unusual pain at the radial site.  The catheter insertion area swells very fast.  The insertion area is bleeding, and the bleeding does not stop when you hold steady pressure on the area.  Your arm or hand becomes pale, cool, tingly, or numb. These symptoms may represent a serious problem that is an emergency. Do not wait to see if the symptoms will go away. Get medical help right away. Call your local emergency services (911 in the [place] Do not drive yourself to the hospital. Summary  After the procedure, it is common to have bruising and tenderness at the site.  Follow instructions from your health care provider about how to take care   of your radial site wound. Check the wound every day for signs of infection.  This information is not intended to replace advice given to you by your health care provider. Make sure you discuss any questions you have with your health care provider. Document Revised: 04/18/2017 Document Reviewed: 04/18/2017 Elsevier Patient Education  2020 [employer] Inc[employer] 

## 2020-07-07 NOTE — Interval H&P Note (Signed)
History and Physical Interval Note:  07/07/2020 3:45 PM  Daniel Buck  has presented today for surgery, with the diagnosis of abnormal nuc.    The patient was contacted via telephone to discuss results of his stress test.  He is scheduled for cardiac catheterization today.  The various methods of treatment have been discussed with the patient and family. After consideration of risks, benefits and other options for treatment, the patient has consented to  Procedure(s): LEFT HEART CATH AND CORS/GRAFTS ANGIOGRAPHY (N/A)  PERCUTANEOUS CORONARY INTERVENTION   as a surgical intervention.  The patient's history has been reviewed, patient examined, no change in status, stable for surgery.  I have reviewed the patient's chart and labs.  Questions were answered to the patient's satisfaction.    Cath Lab Visit (complete for each Cath Lab visit)  Clinical Evaluation Leading to the Procedure:   ACS: No.  Non-ACS:    Anginal Classification: CCS III  Anti-ischemic medical therapy: Maximal Therapy (2 or more classes of medications)  Non-Invasive Test Results: Intermediate-risk stress test findings: cardiac mortality 1-3%/year  Prior CABG: Previous CABG    Glenetta Hew

## 2020-07-08 NOTE — Telephone Encounter (Signed)
I talked to both of them about this yesterday.   Unfortunately, I am now on Vacation as of 5 PM tonite until Monday 4/25.  He probably does not remember me talking about this to him in the cath lab.  (if you take a look @ my report -- It will help.   -- Left Main branches into LAD & LCx/Circumflex; LAD branches are called Diagonals/Diags, LCx branches are called OMs). -- RCA branches into PDA & PAV/PLs   Prior Cath (before CABG):significant RCA stenosis (almost 100% at the old stent & also had a small diffusely diseased PDA) & Left  Main 50% & 65% stenosis  (>50% in LM is considered significant).   Cath from 07/2019 --> prior to CABG (Coronary Artery Bypass Grafting).     He had 4 vessel CABG - I.e. 4 grafts (SVG = Ven Graft, LIMA is the chest wall artery used as a graft); 1 for the RCA & 3 b/c of the Left Main disease.  Left Coronary Artery: LIMA-LAD (OPEN).   SVG-OM2 (OPEN). SVG-Diag1 - 100% BLOCKED - probably b/c too much flow from native artery.  Current Cath:           Right Coronary Artery: SVG-RPL (the major distal RCA branch) - OPEN.  His native vessels have progressed:  RCA- now 100% @ old stent -- this is good b/c graft will stay open. LCx - now 100% after OM1 (before OM2) - also good for graft to stay open.  There is retrograde flow filling the rest of the LCx.  LAD looks about the same - maybe 60% after the Diag & the Diag has ~50-60% ostial disease (probably still lets enough flow get to to where the graft touched down, so the graft closed prematurely).  On my Left Ventriculogram - I estimated his EF (pump function) to be more like ~45%, but feel that we really need an Echocardiogram to know for sure. --> The Myoview often underestimates pump function.   There is only one potential Stent procedure to try to treat this - but would be a difficult procedure to do - & would require pretty much stenting from the Left Main-LAD into the beginning of the Diag. --> this is not  easy & could potentially jeopardize the LIMA-LAD graft for the same reason that the Diag graft closed --> I.e if we open up the Left Main & proximal LAD, only the 60% lesion would be limiting LAD flow & may be enough "competitive flow" to cause the LIMA graft to shut down.  I understand the symptoms that he was having -- not sure what exactly to make of it, but he could have been having Angina from the SVG-Diag closing down & until the heart get flow to where it needs it, he may still have symptoms.    I added Ranexa & we will continue to have to titrate medications.  But for now, we have to be patient & wait for [place] to get the meds tweaked til we can control his Symptoms.  Unfortunately, this Is the nature of CAD & grafts.    Glenetta Hew, MD

## 2020-07-09 ENCOUNTER — Encounter (HOSPITAL_COMMUNITY): Payer: Self-pay | Admitting: Cardiology

## 2020-07-09 ENCOUNTER — Other Ambulatory Visit (HOSPITAL_COMMUNITY): Payer: Self-pay

## 2020-07-09 ENCOUNTER — Other Ambulatory Visit: Payer: Self-pay | Admitting: *Deleted

## 2020-07-09 DIAGNOSIS — Z951 Presence of aortocoronary bypass graft: Secondary | ICD-10-CM

## 2020-07-09 DIAGNOSIS — I208 Other forms of angina pectoris: Secondary | ICD-10-CM

## 2020-07-09 DIAGNOSIS — R9439 Abnormal result of other cardiovascular function study: Secondary | ICD-10-CM

## 2020-07-09 DIAGNOSIS — R0602 Shortness of breath: Secondary | ICD-10-CM

## 2020-07-09 NOTE — Progress Notes (Signed)
Per order from Dr Ellyn Hack from results from [facility]

## 2020-07-15 ENCOUNTER — Other Ambulatory Visit (HOSPITAL_COMMUNITY): Payer: 59

## 2020-07-16 ENCOUNTER — Other Ambulatory Visit: Payer: Self-pay

## 2020-07-16 ENCOUNTER — Telehealth: Payer: Self-pay | Admitting: Cardiology

## 2020-07-16 ENCOUNTER — Ambulatory Visit (HOSPITAL_COMMUNITY): Payer: 59 | Attending: Internal Medicine

## 2020-07-16 DIAGNOSIS — I208 Other forms of angina pectoris: Secondary | ICD-10-CM | POA: Diagnosis not present

## 2020-07-16 DIAGNOSIS — Z951 Presence of aortocoronary bypass graft: Secondary | ICD-10-CM | POA: Diagnosis not present

## 2020-07-16 DIAGNOSIS — R0602 Shortness of breath: Secondary | ICD-10-CM | POA: Insufficient documentation

## 2020-07-16 DIAGNOSIS — R9439 Abnormal result of other cardiovascular function study: Secondary | ICD-10-CM | POA: Insufficient documentation

## 2020-07-16 HISTORY — PX: TRANSTHORACIC ECHOCARDIOGRAM: SHX275

## 2020-07-16 LAB — ECHOCARDIOGRAM COMPLETE
Area-P 1/2: 3.42 cm2
S' Lateral: 3.4 cm

## 2020-07-16 MED ORDER — PERFLUTREN LIPID MICROSPHERE
1.0000 mL | INTRAVENOUS | Status: AC | PRN
  Administered 2020-07-16: 2 mL via INTRAVENOUS

## 2020-07-16 NOTE — Telephone Encounter (Signed)
Called and spoke to wife (okay per DPR) advised that patient just had CATH last week, they are doing the ECHO today, but next appointment with Dr.Harding is not until July- they can not wait that long to be seen. I did look over schedule- but did not see any openings.   Advised wife I would route to MD and to primary nurse to get approval to get patient in any sooner to discuss. Did advise MD and nurse was out of office this week, and when they returned we would be in touch.   Patient wife verbalized understanding.

## 2020-07-16 NOTE — Telephone Encounter (Signed)
Pt had  a Cath last week, he is having a  Echo Today. Pt's wife wants to talk about what is going on with pt since Cath. I spoke with pt's wife and she would rather speak with triage about what is going on with her husband.

## 2020-07-19 NOTE — Telephone Encounter (Signed)
Spoke to wife . Aware That Rn will need to discuss with Dr Ellyn Hack next availble appointment to discuss options for patient. Wife verbalized understanding.

## 2020-07-20 NOTE — Telephone Encounter (Signed)
opening in schedule for an  appt 07/22/20 at 9:20 am Wife states she will take the Time slot if there is a conflict ,she will call back to reschedule

## 2020-07-22 ENCOUNTER — Ambulatory Visit: Payer: 59 | Admitting: Cardiology

## 2020-07-22 ENCOUNTER — Encounter: Payer: Self-pay | Admitting: Cardiology

## 2020-07-22 ENCOUNTER — Other Ambulatory Visit: Payer: Self-pay

## 2020-07-22 VITALS — BP 118/76 | HR 68 | Ht 71.0 in | Wt 220.4 lb

## 2020-07-22 DIAGNOSIS — R42 Dizziness and giddiness: Secondary | ICD-10-CM

## 2020-07-22 DIAGNOSIS — I208 Other forms of angina pectoris: Secondary | ICD-10-CM | POA: Diagnosis not present

## 2020-07-22 DIAGNOSIS — I10 Essential (primary) hypertension: Secondary | ICD-10-CM | POA: Diagnosis not present

## 2020-07-22 DIAGNOSIS — I25119 Atherosclerotic heart disease of native coronary artery with unspecified angina pectoris: Secondary | ICD-10-CM | POA: Diagnosis not present

## 2020-07-22 DIAGNOSIS — E785 Hyperlipidemia, unspecified: Secondary | ICD-10-CM | POA: Diagnosis not present

## 2020-07-22 DIAGNOSIS — Z9861 Coronary angioplasty status: Secondary | ICD-10-CM

## 2020-07-22 DIAGNOSIS — R002 Palpitations: Secondary | ICD-10-CM

## 2020-07-22 DIAGNOSIS — R5383 Other fatigue: Secondary | ICD-10-CM | POA: Diagnosis not present

## 2020-07-22 DIAGNOSIS — Z951 Presence of aortocoronary bypass graft: Secondary | ICD-10-CM

## 2020-07-22 DIAGNOSIS — I2510 Atherosclerotic heart disease of native coronary artery without angina pectoris: Secondary | ICD-10-CM | POA: Diagnosis not present

## 2020-07-22 DIAGNOSIS — I252 Old myocardial infarction: Secondary | ICD-10-CM | POA: Diagnosis not present

## 2020-07-22 NOTE — Progress Notes (Signed)
Primary Care Provider: [name], MD Cardiologist: Glenetta Hew, MD Electrophysiologist: None  Clinic Note: Chief Complaint  Patient presents with  . Hospitalization Follow-up    Post cath  . Coronary Artery Disease    No longer having angina, still fatigued.  . Fatigue    ===================================  ASSESSMENT/PLAN   Problem List Items Addressed This Visit    Fatigue    Backing off on some of the medications and switching amlodipine to nighttime dose to allow for some higher blood pressure during the day.  He does need to get sleep evaluation based on Epworth score.  Perhaps being on CPAP will help his fatigue.      Orthostatic dizziness (Chronic)    Leery of pushing antihypertensive agents.  We are using amlodipine plus beta-blocker for both antianginal benefit.  Not currently on ARB or ACE inhibitor.  With him having borderline blood pressures today and orthostatic symptoms, would not add ARB or ACE inhibitor.  Also will not titrate amlodipine further.      Essential hypertension, benign (Chronic)    Blood pressure is actually pretty stable. With him having little fatigue during the day, plan is to switch his amlodipine dose to the nighttime. Continue moderate dose beta-blocker.      Myocardial infarct, old - Primary (Chronic)    Prior inferior infarct not commented on with this recent Myoview, more of an anteroapical defect.  EF preserved on echo, down on Myoview.  Echo is more accurate.  I think the Myoview EF is down because of septal bounce.  He clearly has an occluded SVG-diagonal, but PCI of the native diagonal would not be a good option.      CAD S/P percutaneous coronary angioplasty (Chronic)    Based on him having occluded vein graft to the diagonal, along with jeopardized flow to the diagonal branch from negative flow, I do want to keep him on Plavix monotherapy.    Plan: Continue symptomatically on Plavix-okay to hold 5 to 7 days preop  for procedures or surgeries.      Hyperlipidemia LDL goal <70 (Chronic)    Recheck LDL was 81 which is halfway between August of last year in January of this year.  Needs more aggressive care, however with his fatigue leery of pushing more on his statin dose.  I do recommend he takes it at nighttime.  Continue Zetia for now.  May need to consider Nexletol.      Heart palpitations (Chronic)    Controlled.      S/P CABG x 4 (Chronic)    3 of 4 grafts patent.  SVG to diagonal occluded.      Coronary artery disease involving native coronary artery of native heart with angina pectoris (HCC) (Chronic)    Multivessel CAD with CABG now with occluded vein graft to the diagonal.  Native flow to the diagonal is compromised by left main disease as well as diagonal stenosis.  Unfortunately, the diagonal disease is actually worse than the LAD disease.  Perform PCI on the left main to perfuse the diagonal and potentially jeopardize the LIMA...  Plan: Medical management with no plans to perform PCI of left main-diagonal.  Continue with Plavix monotherapy  Okay to hold Plavix 5 days preop for surgeries or procedures.  Continue beta-blocker  Continue amlodipine but switch to p.m. dosing.  He is on Ranexa but was having some issues.  We will have him attempt to reduce dose to 1/2 tablet twice daily versus 500  mg once a day.  If angina recurs, then would switch back to 500 twice daily.       Atypical angina (HCC) (Chronic)    Unusual symptoms.  Interestingly, the Myoview does not really correlate with findings on cath.  Previous infarct did show an inferior defect now there is an anterior apical defect.  The LIMA to LAD is widely patent with some competitive flow.  The diagonal is occluded but there is flow.  We will need to treat medically.  Plan: Continue beta-blocker, amlodipine and will try to reduce the dose of Ranexa.    Starting off with Ranexa 250 mg twice daily, but can also try once daily  dosing either morning or evening.  May need to go back to 500 mg twice daily if symptoms worsen.  Not sure if blood pressure will tolerate titrating back up to 5 mg of amlodipine.  He is switching it to p.m. dose.        ===================================  HPI:    Daniel Buck is a 55 y.o. male with a PMH notable for early onset CAD (inferior STEMI 1996-BMS PCI RCA, atherectomy and PCI for ISR 2002), CABG x4 June 2021, along with HTN, HLD and Ulcerative Colitis (on Humira) who presents today for post-cath follow-up   He had been seen by Kerin Ransom, PA on Aug 20, 2019 with exertional chest discomfort and palpitations similar prior anginal symptoms while on a hunting trip in the [place] [phone]).     Referred for Cardiac Cath 08/22/2019 revealing multivessel disease along with Left Main stenosis =>  CABG x 4 (LIMA-LAD, SVG-OM, SVG-Dx, SVG-rPL) => uncomplicated post hospital course.;  Moderate left foot effusion noted on chest x-ray, started colchicine and 5 days of Lasix for gout.  Postop follow-up 09/24/2019 doing well..  Occasional skipped beats but no sustained tachycardia.  Postop follow-up 11/26/2019: Increased energy level.  Still down but not as low.  Still irritable.  Walking about 1-1/2 to 2 miles a day.  No exertional dyspnea or chest pain.  Daniel Buck was last seen on June 28, 2020 for an earlier than expected evaluation.  He complained of worsening symptoms.  He noted extreme fatigue, tired all the time.  Also having a choking sensation dyspnea with some chest tightness.  He does not feel it doing anything.  Very limited activity.  Occasional orthostatic dizziness.  No PND or orthopnea. -> We checked a Myoview stress test which suggested reduced EF and abnormal EKG changes.  There is evidence of prior infarct with peri-infarct ischemia in the apical anterior and apex.  Read as intermediate risk.  Referred for cardiac catheterization.  We also recheck an echocardiogram  to get a better assessment of EF.  Recent Hospitalizations: None  Reviewed  CV studies:    The following studies were reviewed today: (if available, images/films reviewed: From Epic Chart or Care Everywhere) . 3-day Zio patch monitor (January 2022): Rare PACs and PVCs.  No arrhythmias.  Mostly sinus rhythm rate ranged from 58 to 146 bpm.  Average 81 bpm. . Carotid Dopplers 03/15/2020: R ICA 1-39%.  R CCA <50%.  LICA no evidence of stenosis.  Minimal plaque in extracranial vessels.  Normal bilateral vertebral arteries and subclavian arteries.    Myoview 07/02/2020: EF roughly 40%.  Horizontal ST segment depression in anterolateral leads.  Also T wave inversions noted.  Medium size, partially reversible apical anterior and apical defect concerning for prior infarct with peri-infarct ischemia.  Intermediate risk.  Global HK with septal HK from prior sternotomy.  Cardiac Cath 07/07/2020: Severe native CAD-new 100% RCA CTO, 100 stent prox-mid LCx after OM1.  Stable ostial-proximal LM 50% and distal LM 60 to 65% stenosis.  Ostial D1 70% stenosis, mid LAD 65% stenosis.  Patent LIMA-LAD with competitive flow.  Patent SVG-OM2 and SVG-RPL without competitive flow.  Occluded SVG-diagonal-likely culprit lesion.  EF is more consistent with 45 to 50% with likely inferolateral HK. => 2D echo ordered and Ranexa started .   Echo 07/16/2020: EF 55 to 60%.  GR 1 DD.  Paradoxical septal motion consistent with postop state.  Normal RV.  Normal valves.  No significant change.   Interval History:   Daniel Buck is here for follow-up stating that actually he has had less of the choking dyspnea and chest discomfort symptoms since this past weekend, but was still having some exertional dyspnea and fatigue.  Energy is a little bit better we also told him to take his statin in the evening and that seems to be helping as well.  Still feels like he is not getting good rest and is tired all the time, but is overall feeling a  lot better.  We reviewed the cath films and explained the reason why PCI would not be a good option at this point.  Left main PCI to allow for diagonal flow would potentially compromise the LIMA graft.  Not really having any significant PND, orthopnea and no significant edema.  No tachycardia symptoms.  Although he has orthostatic dizziness, no syncope or near syncope. Able to be a little more active, still somewhat fatigued. Still having issues with sleeping.  Tossing and turning most of the night.  Does not wake up rested.  Feels tired that the course the day.  CV Review of Symptoms (Summary)-Cardiovascular ROS: positive for - dyspnea on exertion, edema, irregular heartbeat, palpitations and Fatigue and exercise tolerance.  Nonrestful sleep.  No more chest tightness/choking sensation. negative for - edema, irregular heartbeat, orthopnea, palpitations, paroxysmal nocturnal dyspnea, rapid heart rate, shortness of breath or Lightheadedness, dizziness or syncope/near syncope or TIA/amaurosis fugax, claudication  The patient DOES NOT have symptoms concerning for COVID-19 infection (fever, chills, cough, or new shortness of breath).   REVIEWED OF SYSTEMS   Review of Systems  Constitutional: Positive for malaise/fatigue (Exercise intolerance, feels tired all the time, sleepy). Negative for weight loss (Lost back some of the weight that he had gained).  HENT: Positive for congestion. Negative for nosebleeds.   Respiratory: Positive for shortness of breath (With minimal exertion). Negative for cough.   Gastrointestinal: Negative for abdominal pain, blood in stool and melena.       No recent UC flare.  Genitourinary: Negative for frequency and hematuria.  Musculoskeletal: Positive for joint pain (  Mild musculoskeletal pains).  Neurological: Negative for dizziness (Occasionally orthostatic, but not routinely), focal weakness, weakness and headaches.  Psychiatric/Behavioral: Positive for depression.  Negative for memory loss. The patient has insomnia (Does have a hard time falling asleep, but just wakes up not rested. -> high Epworth Score). The patient is not nervous/anxious.        He seems somewhat down.  Borderline dysthymic.    Epworth Sleepiness Scale: Situation   Chance of Dozing/Sleeping (0 = never , 1 = slight chance , 2 = moderate chance , 3 = high chance )   sitting and reading 2   watching TV 2   sitting inactive in a public place 2  being a passenger in a motor vehicle for an hour or more 3   lying down in the afternoon 3   sitting and talking to someone 0   sitting quietly after lunch (no alcohol) 3   while stopped for a few minutes in traffic as the driver 2   Total Score  17     I have reviewed and (if needed) personally updated the patient's problem list, medications, allergies, past medical and surgical history, social and family history.   PAST MEDICAL HISTORY   Past Medical History:  Diagnosis Date  . Carpal tunnel syndrome on left 12/2011  . Chronic nasal congestion   . Depression 01/19/2012  . GERD (gastroesophageal reflux disease)   . Gout   . History of Doppler ultrasound    a. Carotid US 3/11: no ICA stenosis   . History of echocardiogram    a. Echo 6/14: mod LVH, EF 50-55%, inf-lat HK, Gr 1 DD, mild LAE  . History of hiatal hernia   . Hx of CABG 09/04/2019   LIMA-LAD, SVG-OM, SVG-Dx, and an SVG-RPLA.  [name] Hyperlipidemia LDL goal < 70   . Hypertension    under control; has been on med. since 1996  . Multiple vessel coronary artery disease 1996, '02; '21   a. s/p Inf MI (age 31) >>PCI to RCA with tandem BMS (PS 1535); b. LHC 06/2000: pRCA 30-40%, prox/mid stents with 75% ISR, 30-40% at crux in RCA >> PCI: Cutting Balloon atherectomy for ISR; c. 2012 Myoview-nonischemic; d. 07/2019 -cath with progression of disease/LM disease => CABG x4  . Myocardial infarct, old 1996   PCI  . Sleep apnea    no testing yet  . Ulcerative colitis ([place])     PAST  SURGICAL HISTORY   Past Surgical History:  Procedure Laterality Date  . CARPAL TUNNEL RELEASE  01/26/2012   Procedure: CARPAL TUNNEL RELEASE;  Surgeon: Cammie Sickle., MD;  Location: [facility];  Service: Orthopedics;  Laterality: Left;  . CORONARY ANGIOPLASTY  07/11/2000   Cutting Balloon PTCA for RCA ISR  . CORONARY ARTERY BYPASS GRAFT N/A 09/04/2019   Procedure: CORONARY ARTERY BYPASS GRAFTING (CABG) using LIMA to LAD; Endoscopic Vein Harvest of Right Greater Saphenous: SVG to Diag1; SVG to OM1; SVG to PL.;  Surgeon: Gaye Pollack, MD;  Location: [facility];  Service: Open Heart Surgery;  Laterality: N/A;  . CORONARY STENT PLACEMENT  1996   [id number] BMS x 2 in RCA  . CYSTECTOMY  as a child   from neck  . ENDOVEIN HARVEST OF GREATER SAPHENOUS VEIN Right 09/04/2019   Procedure: ENDOVEIN HARVEST OF GREATER SAPHENOUS VEIN;  Surgeon: Gaye Pollack, MD;  Location: [facility];  Service: Open Heart Surgery;  Laterality: Right;  Endovein scope harvest of right upper and lower LE.  [name] HERNIA REPAIR    . LEFT HEART CATH AND CORONARY ANGIOGRAPHY N/A 08/22/2019   Procedure: LEFT HEART CATH AND CORONARY ANGIOGRAPHY;  Surgeon: Belva Crome, MD;  Location: [facility];;  Eccentric ostial 40 of 50% LM followed by 55-70% distal LM.  65% midLAD (heavily calcified), ostial D1 80%, LCx 70% between OM1 and OM2, RCA large with diffuse ISR prior PCI in 1996 and 2002-up to 90%.  PDA with competitive flow due to collateral flow from LCA.  EF 50-60%.  LVEDP 17   . LEFT HEART CATH AND CORS/GRAFTS ANGIOGRAPHY N/A 07/07/2020   Procedure: LEFT HEART CATH AND CORS/GRAFTS ANGIOGRAPHY;  Surgeon:  [name], MD;  Location: [facility];  Service: Cardiovascular;  Laterality: N/A;  . NM MYOVIEW LTD  December 2012   Inferior infarct but no ischemia  . TEE WITHOUT CARDIOVERSION N/A 09/04/2019   Procedure: INTRAOPERATIVE TRANSESOPHAGEAL ECHOCARDIOGRAM (TEE);  Surgeon: Gaye Pollack, MD;  Location: [facility];  Service: Open Heart Surgery;  Intra-Op TEE: EF 50-55%.  Mild MR normal aortic, pulmonic and tricuspid valves. ->  Stable postop  . TONSILLECTOMY  as a child  . TRANSTHORACIC ECHOCARDIOGRAM  08/2017   Normal echo-normal LV size and function.  EF 55-60%.  No R WMA.  No ASD/PFO.  No valve lesions.    CABG x4 (09/04/2019): LIMA-LAD, SVG-OM, SVG-DX, SVG-RPLA (Dr. Cyndia Bent) ? Intra-Op TEE: EF 50-55%.  Mild MR normal aortic, pulmonic and tricuspid valves. ->  Stable postop  Immunization History  Administered Date(s) Administered  . Influenza,inj,Quad PF,6+ Mos 01/17/2013, 04/07/2015, 03/06/2017  . Pneumococcal Polysaccharide-23 01/17/2013    MEDICATIONS/ALLERGIES   Current Meds  Medication Sig  . acetaminophen (TYLENOL) 500 MG tablet Take 1,000 mg by mouth every 6 (six) hours as needed for moderate pain or headache.  . allopurinol (ZYLOPRIM) 300 MG tablet Take 1 tablet (300 mg total) by mouth daily.  [name] amLODipine (NORVASC) 2.5 MG tablet Take 1 tablet (2.5 mg total) by mouth daily.  . Cholecalciferol (VITAMIN D) 125 MCG (5000 UT) CAPS Take 5,000 Units by mouth daily.  . clopidogrel (PLAVIX) 75 MG tablet Take 75 mg by mouth daily.  [name] esomeprazole (NEXIUM) 40 MG capsule TAKE 1 CAPSULE BY MOUTH TWICE DAILY.  . mesalamine (LIALDA) 1.2 g EC tablet Take 1.2 g by mouth daily with breakfast. Take 1.2g daily, May take an additional 3 tablets as needed (total of 4 Tablets daily)  . metoprolol tartrate (LOPRESSOR) 50 MG tablet Take 1 tablet (50 mg total) by mouth 2 (two) times daily.  . Multiple Vitamins-Minerals (ZINC PO) Take 0.5 tablets by mouth daily.  . nitroGLYCERIN (NITROSTAT) 0.4 MG SL tablet Place 1 tablet (0.4 mg total) under the tongue every 5 (five) minutes as needed.  . ranolazine (RANEXA) 500 MG 12 hr tablet Take 1 tablet (500 mg total) by mouth 2 (two) times daily.  . rosuvastatin (CRESTOR) 40 MG tablet Take 1 tablet (40 mg total) by mouth daily.  . Vilazodone HCl (VIIBRYD) 40 MG TABS  Take by mouth daily.  . [DISCONTINUED] clopidogrel (PLAVIX) 75 MG tablet TAKE 1 TABLET BY MOUTH DAILY. (Patient taking differently: Take 75 mg by mouth daily.)  . [DISCONTINUED] colchicine 0.6 MG tablet Take 0.6 mg by mouth as needed (For gout flares).  . [DISCONTINUED] mesalamine (LIALDA) 1.2 g EC tablet TAKE 1 TABLET BY MOUTH FOUR TIMES DAILY FOR 30 DAYS. (Patient taking differently: Take 1.2 g by mouth daily. Take 1.2g daily, May take an additional 3 tablets as needed (total of 4 Tablets daily))  . [DISCONTINUED] mometasone (ELOCON) 0.1 % cream USE AS DIRECTED 2 TIMES DAILY AS NEEDED  . [DISCONTINUED] nystatin (MYCOSTATIN) 100000 UNIT/ML suspension SWISH WITH 4 ML'S IN MOUTH/THROAT SPIT OUT OR SWALLOW 4 TIMES A DAY FOR 10 DAYS  . [DISCONTINUED] rosuvastatin (CRESTOR) 40 MG tablet TAKE 1 TABLET (40 MG TOTAL) BY MOUTH DAILY.  . [DISCONTINUED] Vilazodone HCl (VIIBRYD) 40 MG TABS TAKE 1 TABLET BY MOUTH WITH FOOD ONCE DAILY (Patient taking differently: Take by mouth daily.)    Allergies  Allergen Reactions  . Pantoprazole     GI upset    SOCIAL HISTORY/FAMILY HISTORY  Reviewed in Epic:  Pertinent findings:  Social History   Tobacco Use  . Smoking status: Former Smoker    Quit date: 03/27/1992    Years since quitting: 28.3  . Smokeless tobacco: Current User    Types: Snuff, Chew  Vaping Use  . Vaping Use: Never used  Substance Use Topics  . Alcohol use: No  . Drug use: No   Social History   Social History Narrative   He lives in [place], [place] near [place] with his wife. [place].  Near the former [address] takes houses his horses.   At baseline, he is extremely active around [place], but does not have a routine exercise regimen.   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   He quit smoking in 1994.    OBJCTIVE -PE, EKG, labs   Wt Readings from Last 3 Encounters:  07/22/20 220 lb 6.4 oz (100 kg)  07/07/20 215 lb (97.5 kg)  07/02/20 210 lb (95.3 kg)     Physical Exam: BP 118/76   Pulse 68   Ht 5' 11"  (1.803 m)   Wt 220 lb 6.4 oz (100 kg)   SpO2 98%   BMI 30.74 kg/m  Physical Exam Vitals reviewed.  Constitutional:      General: He is not in acute distress.    Appearance: Normal appearance. He is obese. He is not ill-appearing or toxic-appearing.     Comments: Relatively healthy appearing.  HENT:     Head: Normocephalic and atraumatic.  Neck:     Vascular: No carotid bruit or JVD.  Cardiovascular:     Rate and Rhythm: Normal rate and regular rhythm. Occasional extrasystoles are present.    Chest Wall: PMI is not displaced.     Pulses: Normal pulses.     Heart sounds: Normal heart sounds, S1 normal and S2 normal. No murmur heard. No friction rub. No gallop.   Pulmonary:     Effort: Pulmonary effort is normal. No respiratory distress.     Breath sounds: Normal breath sounds.  Chest:     Chest wall: No tenderness.  Musculoskeletal:        General: No swelling. Normal range of motion.     Cervical back: Normal range of motion and neck supple.     Comments: Radial access site clean dry and intact.  Skin:    General: Skin is warm and dry.  Neurological:     General: No focal deficit present.     Mental Status: He is alert and oriented to person, place, and time.     Motor: No weakness.     Gait: Gait normal.  Psychiatric:        Mood and Affect: Mood normal.        Behavior: Behavior normal.        Thought Content: Thought content normal.        Judgment: Judgment normal.     Comments: Less irritable     Adult ECG Report Not checked  Recent Labs: Reviewed; LDL not at goal. Lab Results  Component Value Date   CHOL 128 07/02/2020   HDL 27 (L) 07/02/2020   LDLCALC 81 07/02/2020   TRIG 109 07/02/2020   CHOLHDL 4.7 07/02/2020   Lab Results  Component Value Date   CREATININE 1.39 (H) 07/02/2020   BUN 18 07/02/2020   NA 140 07/02/2020   K 4.9 07/02/2020   CL 101 07/02/2020   CO2 24  07/02/2020   CBC Latest  Ref Rng & Units 07/02/2020 03/23/2020 09/09/2019  WBC 3.4 - 10.8 x10E3/uL 9.0 8.9 10.3  Hemoglobin 13.0 - 17.7 g/dL 14.8 14.3 12.7(L)  Hematocrit 37.5 - 51.0 % 46.8 46.3 38.6(L)  Platelets 150 - 450 x10E3/uL 254 306 238    Lab Results  Component Value Date   TSH 2.652 06/14/2016    ==================================================  COVID-19 Education: The signs and symptoms of COVID-19 were discussed with the patient and how to seek care for testing (follow up with PCP or arrange E-visit).   The importance of social distancing and COVID-19 vaccination was discussed today. The patient is practicing social distancing & Masking.   I spent a total of 42 minutes with the patient spent in direct patient consultation.  Additional time spent with chart review  / charting (studies, outside notes, etc): 14 min Total Time: 56 min  Current medicines are reviewed at length with the patient today.  (+/- concerns) NA  This visit occurred during the SARS-CoV-2 public health emergency.  Safety protocols were in place, including screening questions prior to the visit, additional usage of staff PPE, and extensive cleaning of exam room while observing appropriate contact time as indicated for disinfecting solutions.  Notice: This dictation was prepared with Dragon dictation along with smaller phrase technology. Any transcriptional errors that result from this process are unintentional and may not be corrected upon review.  Patient Instructions / Medication Changes & Studies & Tests Ordered   Patient Instructions  Medication Instructions:  Change in how you are taking some of your medications  -- Start taking Amlodipine  2.5 mg either at supper time or at bedtime each day   -try an take Ranexa to 500 mg  take 1/2 tablet of 500 mg   Twice a day for a couple of weeks  Then   Try taking  500 mg just in the morning for 2 weeks, then reverse taking medication in the evening.  if chest discomfort  increase then go back to original dosage  500 mg twice day    *If you need a refill on your cardiac medications before your next appointment, please call your pharmacy*   Lab Work:  Not needed   Testing/Procedures:  Not needed  Follow-Up: At [facility], you and your health needs are our priority.  As part of our continuing mission to provide you with exceptional heart care, we have created designated Provider Care Teams.  These Care Teams include your primary Cardiologist (physician) and Advanced Practice Providers (APPs -  Physician Assistants and Nurse Practitioners) who all work together to provide you with the care you need, when you need it.     Your next appointment:    Keep appointment for June 24 , 2022  The format for your next appointment:   In Person  Provider:   Glenetta Hew, MD   Other Instructions    Studies Ordered:   No orders of the defined types were placed in this encounter.    Glenetta Hew, M.D., M.S. Interventional Cardiologist   Pager # [phone] Phone # [phone] [address]. [place], [place] [address]   Thank you for choosing Heartcare at [facility]!!

## 2020-07-22 NOTE — Patient Instructions (Addendum)
Medication Instructions:  Change in how you are taking some of your medications  -- Start taking Amlodipine  2.5 mg either at supper time or at bedtime each day   -try an take Ranexa to 500 mg  take 1/2 tablet of 500 mg   Twice a day for a couple of weeks  Then   Try taking  500 mg just in the morning for 2 weeks, then reverse taking medication in the evening.  if chest discomfort increase then go back to original dosage  500 mg twice day    *If you need a refill on your cardiac medications before your next appointment, please call your pharmacy*   Lab Work:  Not needed   Testing/Procedures:  Not needed  Follow-Up: At [facility], you and your health needs are our priority.  As part of our continuing mission to provide you with exceptional heart care, we have created designated Provider Care Teams.  These Care Teams include your primary Cardiologist (physician) and Advanced Practice Providers (APPs -  Physician Assistants and Nurse Practitioners) who all work together to provide you with the care you need, when you need it.     Your next appointment:    Keep appointment for June 24 , 2022  The format for your next appointment:   In Person  Provider:   Glenetta Hew, MD   Other Instructions

## 2020-07-31 ENCOUNTER — Encounter: Payer: Self-pay | Admitting: Cardiology

## 2020-07-31 NOTE — Assessment & Plan Note (Signed)
3 of 4 grafts patent.  SVG to diagonal occluded.

## 2020-07-31 NOTE — Assessment & Plan Note (Signed)
Backing off on some of the medications and switching amlodipine to nighttime dose to allow for some higher blood pressure during the day.  He does need to get sleep evaluation based on Epworth score.  Perhaps being on CPAP will help his fatigue.

## 2020-07-31 NOTE — Assessment & Plan Note (Signed)
Prior inferior infarct not commented on with this recent Myoview, more of an anteroapical defect.  EF preserved on echo, down on Myoview.  Echo is more accurate.  I think the Myoview EF is down because of septal bounce.  He clearly has an occluded SVG-diagonal, but PCI of the native diagonal would not be a good option.

## 2020-07-31 NOTE — Assessment & Plan Note (Signed)
Multivessel CAD with CABG now with occluded vein graft to the diagonal.  Native flow to the diagonal is compromised by left main disease as well as diagonal stenosis.  Unfortunately, the diagonal disease is actually worse than the LAD disease.  Perform PCI on the left main to perfuse the diagonal and potentially jeopardize the LIMA...  Plan: Medical management with no plans to perform PCI of left main-diagonal.  Continue with Plavix monotherapy  Okay to hold Plavix 5 days preop for surgeries or procedures.  Continue beta-blocker  Continue amlodipine but switch to p.m. dosing.  He is on Ranexa but was having some issues.  We will have him attempt to reduce dose to 1/2 tablet twice daily versus 500 mg once a day.  If angina recurs, then would switch back to 500 twice daily.

## 2020-07-31 NOTE — Assessment & Plan Note (Signed)
Unusual symptoms.  Interestingly, the Myoview does not really correlate with findings on cath.  Previous infarct did show an inferior defect now there is an anterior apical defect.  The LIMA to LAD is widely patent with some competitive flow.  The diagonal is occluded but there is flow.  We will need to treat medically.  Plan: Continue beta-blocker, amlodipine and will try to reduce the dose of Ranexa.    Starting off with Ranexa 250 mg twice daily, but can also try once daily dosing either morning or evening.  May need to go back to 500 mg twice daily if symptoms worsen.  Not sure if blood pressure will tolerate titrating back up to 5 mg of amlodipine.  He is switching it to p.m. dose.

## 2020-07-31 NOTE — Assessment & Plan Note (Signed)
Blood pressure is actually pretty stable. With him having little fatigue during the day, plan is to switch his amlodipine dose to the nighttime. Continue moderate dose beta-blocker.

## 2020-07-31 NOTE — Assessment & Plan Note (Signed)
Based on him having occluded vein graft to the diagonal, along with jeopardized flow to the diagonal branch from negative flow, I do want to keep him on Plavix monotherapy.    Plan: Continue symptomatically on Plavix-okay to hold 5 to 7 days preop for procedures or surgeries.

## 2020-07-31 NOTE — Assessment & Plan Note (Signed)
Controlled.  

## 2020-07-31 NOTE — Assessment & Plan Note (Signed)
Recheck LDL was 81 which is halfway between August of last year in January of this year.  Needs more aggressive care, however with his fatigue leery of pushing more on his statin dose.  I do recommend he takes it at nighttime.  Continue Zetia for now.  May need to consider Nexletol.

## 2020-07-31 NOTE — Assessment & Plan Note (Signed)
Leery of pushing antihypertensive agents.  We are using amlodipine plus beta-blocker for both antianginal benefit.  Not currently on ARB or ACE inhibitor.  With him having borderline blood pressures today and orthostatic symptoms, would not add ARB or ACE inhibitor.  Also will not titrate amlodipine further.

## 2020-08-02 ENCOUNTER — Other Ambulatory Visit (HOSPITAL_COMMUNITY): Payer: Self-pay

## 2020-08-02 MED FILL — Allopurinol Tab 300 MG: ORAL | 30 days supply | Qty: 30 | Fill #1 | Status: AC

## 2020-08-05 ENCOUNTER — Telehealth: Payer: Self-pay | Admitting: Cardiology

## 2020-08-05 ENCOUNTER — Other Ambulatory Visit (HOSPITAL_COMMUNITY): Payer: Self-pay

## 2020-08-05 ENCOUNTER — Other Ambulatory Visit: Payer: Self-pay | Admitting: Cardiology

## 2020-08-05 MED ORDER — RANOLAZINE ER 500 MG PO TB12
500.0000 mg | ORAL_TABLET | Freq: Two times a day (BID) | ORAL | 3 refills | Status: DC
  Filled 2020-08-05: qty 180, 90d supply, fill #0
  Filled 2020-10-29: qty 180, 90d supply, fill #1
  Filled 2021-01-29: qty 180, 90d supply, fill #2
  Filled 2021-04-25: qty 180, 90d supply, fill #3

## 2020-08-05 NOTE — Telephone Encounter (Signed)
Left message for patient - prescription sent to [facility] out patient pharmacy   Left detail about direction any question please cal back  remove [facility] from patient pharmacy profile.

## 2020-08-05 NOTE — Telephone Encounter (Signed)
    Pt c/o medication issue:  1. Name of Medication:   ranolazine (RANEXA) 500 MG 12 hr tablet    2. How are you currently taking this medication (dosage and times per day)? TAKE (1) TABLET BY MOUTH TWICE DAILY.  3. Are you having a reaction (difficulty breathing--STAT)?   4. What is your medication issue? Manuela Schwartz with [facility] outpatient pharmacy calling, she said pt is a [employer] employee and all prescription should be send to [facility]. She said to remove [facility] pharmacy on file.

## 2020-08-06 ENCOUNTER — Other Ambulatory Visit (HOSPITAL_COMMUNITY): Payer: Self-pay | Admitting: Gastroenterology

## 2020-08-06 ENCOUNTER — Other Ambulatory Visit (HOSPITAL_COMMUNITY): Payer: Self-pay

## 2020-08-06 MED ORDER — VIIBRYD 40 MG PO TABS
40.0000 mg | ORAL_TABLET | Freq: Every day | ORAL | 3 refills | Status: DC
  Filled 2020-08-06 – 2020-09-23 (×2): qty 90, 90d supply, fill #0
  Filled 2020-12-21: qty 90, 90d supply, fill #1
  Filled 2021-03-21: qty 90, 90d supply, fill #2
  Filled 2021-06-13: qty 90, 90d supply, fill #3

## 2020-08-07 ENCOUNTER — Other Ambulatory Visit (HOSPITAL_COMMUNITY): Payer: Self-pay

## 2020-08-09 ENCOUNTER — Other Ambulatory Visit (HOSPITAL_COMMUNITY): Payer: Self-pay

## 2020-08-09 MED ORDER — MESALAMINE 1.2 G PO TBEC
1.2000 g | DELAYED_RELEASE_TABLET | Freq: Four times a day (QID) | ORAL | 3 refills | Status: DC
  Filled 2020-08-09: qty 120, 30d supply, fill #0

## 2020-08-10 ENCOUNTER — Other Ambulatory Visit (HOSPITAL_COMMUNITY): Payer: Self-pay

## 2020-08-18 ENCOUNTER — Other Ambulatory Visit (HOSPITAL_COMMUNITY): Payer: Self-pay

## 2020-08-24 ENCOUNTER — Other Ambulatory Visit (HOSPITAL_COMMUNITY): Payer: Self-pay

## 2020-08-24 MED FILL — Clopidogrel Bisulfate Tab 75 MG (Base Equiv): ORAL | 90 days supply | Qty: 90 | Fill #0 | Status: AC

## 2020-09-02 ENCOUNTER — Other Ambulatory Visit: Payer: Self-pay

## 2020-09-02 ENCOUNTER — Other Ambulatory Visit (HOSPITAL_COMMUNITY): Payer: Self-pay

## 2020-09-02 MED ORDER — ALLOPURINOL 300 MG PO TABS
300.0000 mg | ORAL_TABLET | Freq: Every day | ORAL | 2 refills | Status: DC
  Filled 2020-09-02: qty 90, 90d supply, fill #0
  Filled 2020-11-27: qty 90, 90d supply, fill #1
  Filled 2021-03-02: qty 90, 90d supply, fill #2

## 2020-09-06 DIAGNOSIS — G5601 Carpal tunnel syndrome, right upper limb: Secondary | ICD-10-CM | POA: Diagnosis not present

## 2020-09-14 DIAGNOSIS — E559 Vitamin D deficiency, unspecified: Secondary | ICD-10-CM | POA: Diagnosis not present

## 2020-09-14 DIAGNOSIS — R5383 Other fatigue: Secondary | ICD-10-CM | POA: Diagnosis not present

## 2020-09-14 DIAGNOSIS — E291 Testicular hypofunction: Secondary | ICD-10-CM | POA: Diagnosis not present

## 2020-09-14 DIAGNOSIS — E034 Atrophy of thyroid (acquired): Secondary | ICD-10-CM | POA: Diagnosis not present

## 2020-09-17 ENCOUNTER — Other Ambulatory Visit: Payer: Self-pay

## 2020-09-17 ENCOUNTER — Encounter: Payer: 59 | Admitting: Cardiology

## 2020-09-17 NOTE — Progress Notes (Signed)
This encounter was created in error - please disregard.

## 2020-09-23 ENCOUNTER — Other Ambulatory Visit (HOSPITAL_COMMUNITY): Payer: Self-pay

## 2020-09-29 DIAGNOSIS — G4719 Other hypersomnia: Secondary | ICD-10-CM | POA: Diagnosis not present

## 2020-10-03 MED FILL — Metoprolol Tartrate Tab 50 MG: ORAL | 90 days supply | Qty: 180 | Fill #1 | Status: AC

## 2020-10-04 ENCOUNTER — Other Ambulatory Visit (HOSPITAL_COMMUNITY): Payer: Self-pay

## 2020-10-13 ENCOUNTER — Other Ambulatory Visit (HOSPITAL_BASED_OUTPATIENT_CLINIC_OR_DEPARTMENT_OTHER): Payer: Self-pay

## 2020-10-13 DIAGNOSIS — R0683 Snoring: Secondary | ICD-10-CM

## 2020-10-13 DIAGNOSIS — R0681 Apnea, not elsewhere classified: Secondary | ICD-10-CM

## 2020-10-13 DIAGNOSIS — G4710 Hypersomnia, unspecified: Secondary | ICD-10-CM

## 2020-10-20 ENCOUNTER — Encounter: Payer: Self-pay | Admitting: Cardiology

## 2020-10-20 NOTE — Progress Notes (Signed)
Primary Care Provider: [name], MD Cardiologist: Glenetta Hew, MD Electrophysiologist: None  Clinic Note: Chief Complaint  Patient presents with   Follow-up    2-month  Has not yet had sleep study test   Coronary Artery Disease    Still on Ranexa 500 mg twice daily and amlodipine 2.5 mg nightly.  No further chest pain.     ===================================  ASSESSMENT/PLAN   Problem List Items Addressed This Visit     Coronary artery disease involving native coronary artery of native heart with angina pectoris ([facility] - Primary (Chronic)    Multivessel CAD with CABG and occluded vein graft to the diagonal.  Also native RCA is occluded-jeopardizing the PDA and diagonal branch. Angina symptoms pretty well controlled with a combination of Ranexa 500 mg twice daily and amlodipine 2.5 mg nightly along with metoprolol tartrate 50 mg twice daily.  Plan:  Continue current antianginal therapy as noted Continue Plavix monotherapy along with rosuvastatin and Zetia. Okay to hold Plavix 5 days preop for surgery procedures.  (7 days for more high risk procedures.)       Relevant Medications   rosuvastatin (CRESTOR) 40 MG tablet   Coronary artery disease involving coronary bypass graft of native heart with angina pectoris (HCC) (Chronic)    Occluded vein graft to diagonal branch.   I Suspect That the Diagonal was occluded because of competitive flow from the LIMA and native LAD.  Angeles is very well controlled with addition of amlodipine 2.5 mg nightly and Ranexa 500 mg twice daily.       Relevant Medications   rosuvastatin (CRESTOR) 40 MG tablet   Other Relevant Orders   Lipid panel   Comprehensive metabolic panel   Orthostatic dizziness (Chronic)    Doing well.  No longer having orthostatic symptoms.  We have taken him off ACE inhibitor/ARB medications to allow addition of amlodipine for antianginal benefit.  Blood pressure stable now, would avoid titrating  further.       Fatigue   Essential hypertension, benign (Chronic)    Blood pressure pretty well controlled on Lopressor 50 mg twice daily.  Tolerating switching amlodipine 2.5 mg to p.m.       Relevant Medications   rosuvastatin (CRESTOR) 40 MG tablet   Myocardial infarct, old (Chronic)    Prior infarct noted on Myoview with anterior apical defect.  This probably goes along with the occluded vein graft to the diagonal.  There also could be combination of septal bounce postoperatively.       Relevant Medications   rosuvastatin (CRESTOR) 40 MG tablet   Hyperlipidemia LDL goal <70 (Chronic)    Unfortunately, he has not yet started Zetia and therefore we should not recheck his labs at this point.  Plan will be to have him start Zetia and then recheck labs in the end of September/early October.  Next option would be to consider Nexletol.       Relevant Medications   rosuvastatin (CRESTOR) 40 MG tablet   Other Relevant Orders   Lipid panel   Comprehensive metabolic panel   Heart palpitations (Chronic)    Notably controlled on beta-blocker.       Sleep-disordered breathing (Chronic)    Significantly elevated Epworth score.  Polysomnogram ordered.  He says he did not get much of any sleep over the last 316month  Is a combination of insomnia and then nonrestful sleep.  I do think that he would benefit from sleep medicine evaluation more so than just  OSA evaluation.        ===================================  HPI:    Daniel Buck is a 55 y.o. male with a PMH notable for: Early-onset CAD (inferior STEMI 1996-BMS RCA, atherectomy/PCI for ISR in 2002)-CABG times 29 August 2019, HTN and HLD, along with UC (on Humira) who presents today for 63-monthfollow-up.  1996: Inferior STEMI-BMS PCI RCA 2002: Recurrent angina-atherectomy and PCI for ISR 08/22/2019: Recurrent Angina-Cath => MV CAD/LM stenosis => CABG  CABG x4 (LIMA-LAD, SVG-OM, SVG-DX, SVG-RPL) Follow-up 11/26/2019: Noted some  increased energy level, but not back to previous baseline.  Able to walk 1.5-2 miles/day without chest pain or dyspnea. 06/28/2020: Worsening fatigue, choking sensation of dyspnea and occasional chest tightness.  Occasional orthostatic dizziness Myoview 07/02/2020: INTERMEDIATE RISK. EF ~40%).  Anterolateral ST depression w/ TWI.  Med-sized partially-reversible perfusion defect in anterolateral & apical wall c/w prior Infarct w/ peri-infarct Ischemia. Global HK w/ abn septal WM 2/2 sternotomy.  CATH 07/07/2020: New findings - 100% RCA CTO, 100% CTO prox-mid LCx after OM1 & Occluded SVG-D1 (? Culprit lesion). Stable: Ost-prox LM 50%, distal LM 60%. Ost D1 80%, mid LAD 60% (pre LIMA) -> 40% post Patent LIMA-LAD. OM1 patent w/ diffuse mild disease. Patent SVG-OM2, SVG--RPL1 (no Competitive native flow).  EF ~ 45% w/ Inferolateral HK. (Recommend Echo) =?> started Ranexa 2D Echo 07/16/2020: EF 55 to 60%. Paradoxical septal motion consistent with postop state GR 1 DD.  Normal valves.  Normal RV. No change from 09/13/2017   Daniel Buck was last seen on 07/14/2020 for post-cath follow-up.Has noted less of the choking dyspnea and chest discomfort.  Still having some exertional dyspnea.  Energy level improved-taking statin in the evening.  Felt as though he was not getting good rest.  No PND, orthopnea or edema.  No tachycardia.  Occasional orthostatic dizziness, no syncope or near syncope.  Still fatigued, but more active. -> Epworth score 17 => recommended OSA Evaluation.  Having some issues with: -> Reduced Ranexa 25 mg BID (vs. 500 mg qAM or PM) potentially increase to 500 mg twice daily if symptoms worsen. Amlodipine switched to p.m. dosing. ? Need for more aggressive Lipid management  Recent Hospitalizations: NONE  Reviewed  CV studies:    The following studies were reviewed today: (if available, images/films reviewed: From Epic Chart or Care Everywhere) NO new studies -> [facility]  [facility]  Interval History:   Daniel Buck returns here today overall feeling better.  He is anxiously awaiting his sleep study.  He said that he really has not been sleeping well.  Over the last 3-4 nights he just had very poor sleep-hardly any sleep.  He has a hard time going to sleep, and then when he does fall asleep he has spells where he feels like he wakes up catching his breath.  Does not feel like his sleep is restful when he does sleep.  I suspect is combination of sleep disordered breathing with insomnia.  He never did back down on his Ranexa-continue to take it 500 mg twice daily and since switching his amlodipine to p.m., he has not had any further episodes of chest pain.  He does get a little bit of fatigue and if he overexerts may have some discomfort.  No palpitations on current dose of beta-blocker.  No more dizziness or wooziness.  He never started taking Zetia-we will need to make sure symptoms are stable with medications.  CV Review of Symptoms (Summary) Cardiovascular ROS: positive  for - poor sleep-associated with fatigue.  Some exertional dyspnea, but improved.  Only chest pain with vigorous exertion. negative for - edema, irregular heartbeat, orthopnea, palpitations, paroxysmal nocturnal dyspnea, rapid heart rate, shortness of breath, or lightheadedness, dizziness or wooziness, syncope/near syncope or TIA/amaurosis fugax.  Claudication.  REVIEWED OF SYSTEMS   Review of Systems  Constitutional:  Positive for malaise/fatigue (More associated with the lack of sleep-either insomnia or sleep apnea.  Sleepy all day.  Exercise intolerance.). Negative for weight loss.  HENT:  Positive for congestion (Off-and-on, not currently). Negative for nosebleeds.   Respiratory:  Negative for cough and shortness of breath.   Cardiovascular:  Negative for claudication and leg swelling.  Gastrointestinal:  Negative for blood in stool and melena.  Genitourinary:  Negative for hematuria.   Musculoskeletal:  Positive for joint pain. Negative for falls and myalgias.  Neurological:  Negative for dizziness and focal weakness.  Psychiatric/Behavioral:  Negative for depression and memory loss. The patient has insomnia. The patient is not nervous/anxious.        Mood is down, but somewhat improved.   I have reviewed and (if needed) personally updated the patient's problem list, medications, allergies, past medical and surgical history, social and family history.   PAST MEDICAL HISTORY   Past Medical History:  Diagnosis Date   Carpal tunnel syndrome on left 12/2011   Chronic nasal congestion    Depression 01/19/2012   GERD (gastroesophageal reflux disease)    Gout    History of Doppler ultrasound    a. Carotid US 3/11: no ICA stenosis    History of echocardiogram    a. Echo 6/14: mod LVH, EF 50-55%, inf-lat HK, Gr 1 DD, mild LAE   History of hiatal hernia    Hx of CABG 09/04/2019   LIMA-LAD, SVG-OM, SVG-Dx, and an SVG-RPLA.   Hyperlipidemia LDL goal < 70    Hypertension    under control; has been on med. since 1996   Multiple vessel coronary artery disease 1996, '02; '21   a. s/p Inf MI (age 46) >>PCI to RCA with tandem BMS (PS 1535); b. LHC 06/2000: pRCA 30-40%, prox/mid stents with 75% ISR, 30-40% at crux in RCA >> PCI: Cutting Balloon atherectomy for ISR; c. 2012 Myoview-nonischemic; d. 07/2019 -cath with progression of disease/LM disease => CABG x4   Myocardial infarct, old 1996   PCI   Sleep apnea    no testing yet   Ulcerative colitis ([place])    Cath 07/07/2020: NEW - CTO mRCA, mLCx after OM1 & SVG-D1 (CULPRIT).  --No good PCI option (to revascularize D1, would require LM-ost D1 atherectomy-PCI.  This would jeopardize LIMA-LAD) .    PAST SURGICAL HISTORY   Past Surgical History:  Procedure Laterality Date   CAROTID DOPPLERS  02/2020   R ICA 1-39%.  R CCA <50%.  LICA no evidence of stenosis.  Minimal plaque in extracranial vessels.  Normal bilateral vertebral  arteries and subclavian arteries.   CARPAL TUNNEL RELEASE  01/26/2012   Procedure: CARPAL TUNNEL RELEASE;  Surgeon: Cammie Sickle., MD;  Location: [facility];  Service: Orthopedics;  Laterality: Left;   CORONARY ANGIOPLASTY  07/11/2000   Cutting Balloon PTCA for RCA ISR   CORONARY ARTERY BYPASS GRAFT N/A 09/04/2019   Procedure: CORONARY ARTERY BYPASS GRAFTING (CABG) using LIMA to LAD; Endoscopic Vein Harvest of Right Greater Saphenous: SVG to Diag1; SVG to OM2; SVG to PL.;  Surgeon: Gaye Pollack, MD;  Location: [facility];  Service: Open Heart Surgery;  Laterality: N/A;   CORONARY STENT PLACEMENT  1996   [id number] BMS x 2 in RCA   CYSTECTOMY  as a child   from neck   ENDOVEIN HARVEST OF GREATER SAPHENOUS VEIN Right 09/04/2019   Procedure: [place];  Surgeon: Gaye Pollack, MD;  Location: [facility];  Service: Open Heart Surgery;  Laterality: Right;  Endovein scope harvest of right upper and lower LE.   HERNIA REPAIR     LEFT HEART CATH AND CORONARY ANGIOGRAPHY N/A 08/22/2019   Procedure: LEFT HEART CATH AND CORONARY ANGIOGRAPHY;  Surgeon: Belva Crome, MD;  Location: [facility];;  Eccentric ostial 40 of 50% LM followed by 55-70% distal LM.  65% midLAD (heavily calcified), ostial D1 80%, LCx 70% between OM1 and OM2, RCA large with diffuse ISR prior PCI in 1996 and 2002-up to 90%.  PDA with competitive flow due to collateral flow from LCA.  EF 50-60%.  LVEDP 17    LEFT HEART CATH AND CORS/GRAFTS ANGIOGRAPHY N/A 07/07/2020   Procedure: LEFT HEART CATH AND CORS/GRAFTS ANGIOGRAPHY;  Surgeon: [name], MD;  Location: [facility];; New: 100% RCA CTO, 100% CTO p-m Cx after OM1; CTO SVG-D1 (? Culprit). Stable: Ost-pLM 50%, dLM 60%. Ost D1 80%, mid LAD 60% (pre LIMA) -> Patent LIMA-LAD. OM1 patent w/ diffuse mild Dz. Patent SVG-OM2, SVG--RPL1 (no Competitive native flow).  EF ~ 45% w/ Inflat HK. (Rec Echo)   NM MYOVIEW LTD  02/2011    Inferior infarct but no ischemia   NM MYOVIEW LTD  07/02/2020   EF roughly 40%.  Horizontal ST segment depression in anterolateral leads.  Also T wave inversions noted.  Medium size, partially reversible apical anterior and apical defect concerning for prior infarct with peri-infarct ischemia.  Intermediate risk.  Global HK with septal HK from prior sternotomy.   TEE WITHOUT CARDIOVERSION N/A 09/04/2019   Procedure: INTRAOPERATIVE TRANSESOPHAGEAL ECHOCARDIOGRAM (TEE);  Surgeon: Gaye Pollack, MD;  Location: [facility];  Service: Open Heart Surgery;  Intra-Op TEE: EF 50-55%.  Mild MR normal aortic, pulmonic and tricuspid valves. ->  Stable postop   TONSILLECTOMY  as a child   TRANSTHORACIC ECHOCARDIOGRAM  07/16/2020   EF 55 to 60%.  Paradoxical septal motion c/w Post-OP Sternotomy.  GR 1 DD.  Normal valves.  Normal RV. No change from 09/13/2017   ZIO PATCH EVENT MONITOR  03/2020   Rare PACs and PVCs.  No arrhythmias.  Mostly sinus rhythm rate ranged from 58 to 146 bpm.  Average 81 bpm.    Immunization History  Administered Date(s) Administered   Influenza,inj,Quad PF,6+ Mos 01/17/2013, 04/07/2015, 03/06/2017   Pneumococcal Polysaccharide-23 01/17/2013    MEDICATIONS/ALLERGIES   Current Meds  Medication Sig   acetaminophen (TYLENOL) 500 MG tablet Take 1,000 mg by mouth every 6 (six) hours as needed for moderate pain or headache.   allopurinol (ZYLOPRIM) 300 MG tablet Take 1 tablet (300 mg total) by mouth daily.   amLODipine (NORVASC) 2.5 MG tablet Take 1 tablet (2.5 mg total) by mouth daily.   Cholecalciferol (VITAMIN D) 125 MCG (5000 UT) CAPS Take 5,000 Units by mouth daily.   clopidogrel (PLAVIX) 75 MG tablet Take 75 mg by mouth daily.   esomeprazole (NEXIUM) 40 MG capsule TAKE 1 CAPSULE BY MOUTH TWICE DAILY.   ezetimibe (ZETIA) 10 MG tablet TAKE 1 TABLET (10 MG TOTAL) BY MOUTH DAILY.   mesalamine (LIALDA) 1.2 g EC  tablet Take 1.2 g by mouth daily with breakfast. Take 1.2g daily, May take an  additional 3 tablets as needed (total of 4 Tablets daily)   metoprolol tartrate (LOPRESSOR) 50 MG tablet Take 1 tablet (50 mg total) by mouth 2 (two) times daily.   Multiple Vitamins-Minerals (ZINC PO) Take 0.5 tablets by mouth daily.   nitroGLYCERIN (NITROSTAT) 0.4 MG SL tablet Place 1 tablet (0.4 mg total) under the tongue every 5 (five) minutes as needed.   ranolazine (RANEXA) 500 MG 12 hr tablet Take 1 tablet (500 mg total) by mouth 2 (two) times daily.   Vilazodone HCl (VIIBRYD) 40 MG TABS Take 1 tablet (40 mg total) by mouth daily with food   [DISCONTINUED] clopidogrel (PLAVIX) 75 MG tablet Take 1 tablet (75 mg total) by mouth daily.   [DISCONTINUED] mesalamine (LIALDA) 1.2 g EC tablet Take 1 tablet (1.2 g total) by mouth once to 4 times daily.   [DISCONTINUED] rosuvastatin (CRESTOR) 40 MG tablet Take 1 tablet (40 mg total) by mouth daily.   [DISCONTINUED] Vilazodone HCl (VIIBRYD) 40 MG TABS Take by mouth daily.    Allergies  Allergen Reactions   Pantoprazole     GI upset    SOCIAL HISTORY/FAMILY HISTORY   Reviewed in Epic:  Pertinent findings:  Social History   Tobacco Use   Smoking status: Former   Smokeless tobacco: Current    Types: Snuff, Chew  Vaping Use   Vaping Use: Never used  Substance Use Topics   Alcohol use: No   Drug use: No   Social History   Social History Narrative   He lives in [place], [place] near [place] with his wife. [place].  Near the former [address] takes houses his horses.   At baseline, he is extremely active around [place], but does not have a routine exercise regimen.   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   He quit smoking in 1994.    OBJCTIVE -PE, EKG, labs   Wt Readings from Last 3 Encounters:  10/21/20 216 lb 9.6 oz (98.2 kg)  07/22/20 220 lb 6.4 oz (100 kg)  07/07/20 215 lb (97.5 kg)    Physical Exam: BP 120/76   Pulse 63   Ht 5' 11"  (1.803 m)   Wt 216 lb 9.6 oz (98.2 kg)   SpO2 96%   BMI 30.21  kg/m  Physical Exam Vitals reviewed.  Constitutional:      General: He is not in acute distress.    Appearance: Normal appearance. He is obese. He is not ill-appearing or toxic-appearing.     Comments: Well-nourished, well-groomed  HENT:     Head: Normocephalic and atraumatic.  Neck:     Vascular: No carotid bruit or JVD.  Cardiovascular:     Rate and Rhythm: Normal rate and regular rhythm. No extrasystoles are present.    Chest Wall: PMI is not displaced.     Pulses: Normal pulses.     Heart sounds: Normal heart sounds. No murmur heard.   No friction rub. No gallop.  Pulmonary:     Effort: Pulmonary effort is normal. No respiratory distress.     Breath sounds: Normal breath sounds. No wheezing or rhonchi.  Musculoskeletal:        General: No swelling. Normal range of motion.     Cervical back: Normal range of motion and neck supple.  Skin:    General: Skin is warm and dry.  Coloration: Skin is not pale.  Neurological:     General: No focal deficit present.     Mental Status: He is alert and oriented to person, place, and time.  Psychiatric:        Mood and Affect: Mood normal.        Behavior: Behavior normal.        Thought Content: Thought content normal.        Judgment: Judgment normal.     Comments: Seems to be in a slightly better mood     Adult ECG Report -N/A  Recent Labs: Not recently checked Lab Results  Component Value Date   CHOL 128 07/02/2020   HDL 27 (L) 07/02/2020   LDLCALC 81 07/02/2020   TRIG 109 07/02/2020   CHOLHDL 4.7 07/02/2020   Lab Results  Component Value Date   CREATININE 1.39 (H) 07/02/2020   BUN 18 07/02/2020   NA 140 07/02/2020   K 4.9 07/02/2020   CL 101 07/02/2020   CO2 24 07/02/2020   CBC Latest Ref Rng & Units 07/02/2020 03/23/2020 09/09/2019  WBC 3.4 - 10.8 x10E3/uL 9.0 8.9 10.3  Hemoglobin 13.0 - 17.7 g/dL 14.8 14.3 12.7(L)  Hematocrit 37.5 - 51.0 % 46.8 46.3 38.6(L)  Platelets 150 - 450 x10E3/uL 254 306 238    Lab  Results  Component Value Date   TSH 2.652 06/14/2016    ==================================================  COVID-19 Education: The signs and symptoms of COVID-19 were discussed with the patient and how to seek care for testing (follow up with PCP or arrange E-visit).    I spent a total of 23 minutes with the patient spent in direct patient consultation.  Additional time spent with chart review  / charting (studies, outside notes, etc): 10 min Total Time: 33 min  Current medicines are reviewed at length with the patient today.  (+/- concerns) n/a  This visit occurred during the SARS-CoV-2 public health emergency.  Safety protocols were in place, including screening questions prior to the visit, additional usage of staff PPE, and extensive cleaning of exam room while observing appropriate contact time as indicated for disinfecting solutions.  Notice: This dictation was prepared with Dragon dictation along with smaller phrase technology. Any transcriptional errors that result from this process are unintentional and may not be corrected upon review.  Patient Instructions / Medication Changes & Studies & Tests Ordered   Patient Instructions  Medication Instructions:  Continue current medications  Start taking the Ezetimibe( Zetia) 10 mg one tablet daily    It will be okay to hold Plavix ( clopidogrel ) if need for procedures - the medical office will need to sent a official clearance for directions    *If you need a refill on your cardiac medications before your next appointment, please call your pharmacy*   Lab Work: [place]- Sept/Oct 2022   If you have labs (blood work) drawn today and your tests are completely normal, you will receive your results only by: [place] (if you have MyChart) OR A paper copy in the mail If you have any lab test that is abnormal or we need to change your treatment, we will call you to review the  results.   Testing/Procedures:  Not needed  Follow-Up: At [facility], you and your health needs are our priority.  As part of our continuing mission to provide you with exceptional heart care, we have created designated Provider Care Teams.  These Care Teams include your primary Cardiologist (physician) and  Advanced Practice Providers (APPs -  Physician Assistants and Nurse Practitioners) who all work together to provide you with the care you need, when you need it.     Your next appointment:   6 month(s)  The format for your next appointment:   In Person  Provider:   Glenetta Hew, MD   Other Instructions    Studies Ordered:   Orders Placed This Encounter  Procedures   Lipid panel   Comprehensive metabolic panel      Glenetta Hew, M.D., M.S. Interventional Cardiologist   Pager # [phone] Phone # [phone] [address] [place], [place] [address]   Thank you for choosing Heartcare at [facility]!!

## 2020-10-21 ENCOUNTER — Other Ambulatory Visit (HOSPITAL_COMMUNITY): Payer: Self-pay

## 2020-10-21 ENCOUNTER — Other Ambulatory Visit: Payer: Self-pay

## 2020-10-21 ENCOUNTER — Encounter: Payer: Self-pay | Admitting: Cardiology

## 2020-10-21 ENCOUNTER — Ambulatory Visit (INDEPENDENT_AMBULATORY_CARE_PROVIDER_SITE_OTHER): Payer: 59 | Admitting: Cardiology

## 2020-10-21 ENCOUNTER — Other Ambulatory Visit: Payer: Self-pay | Admitting: *Deleted

## 2020-10-21 VITALS — BP 120/76 | HR 63 | Ht 71.0 in | Wt 216.6 lb

## 2020-10-21 DIAGNOSIS — I25709 Atherosclerosis of coronary artery bypass graft(s), unspecified, with unspecified angina pectoris: Secondary | ICD-10-CM

## 2020-10-21 DIAGNOSIS — G4730 Sleep apnea, unspecified: Secondary | ICD-10-CM

## 2020-10-21 DIAGNOSIS — I25119 Atherosclerotic heart disease of native coronary artery with unspecified angina pectoris: Secondary | ICD-10-CM

## 2020-10-21 DIAGNOSIS — R5383 Other fatigue: Secondary | ICD-10-CM | POA: Diagnosis not present

## 2020-10-21 DIAGNOSIS — R42 Dizziness and giddiness: Secondary | ICD-10-CM

## 2020-10-21 DIAGNOSIS — E785 Hyperlipidemia, unspecified: Secondary | ICD-10-CM

## 2020-10-21 DIAGNOSIS — I10 Essential (primary) hypertension: Secondary | ICD-10-CM | POA: Diagnosis not present

## 2020-10-21 DIAGNOSIS — R002 Palpitations: Secondary | ICD-10-CM

## 2020-10-21 DIAGNOSIS — I252 Old myocardial infarction: Secondary | ICD-10-CM | POA: Diagnosis not present

## 2020-10-21 MED ORDER — ROSUVASTATIN CALCIUM 40 MG PO TABS
40.0000 mg | ORAL_TABLET | Freq: Every day | ORAL | 3 refills | Status: DC
  Filled 2020-10-21 – 2020-12-25 (×2): qty 90, 90d supply, fill #0
  Filled 2021-03-21: qty 90, 90d supply, fill #1
  Filled 2021-06-13: qty 90, 90d supply, fill #2
  Filled 2021-09-14: qty 90, 90d supply, fill #3

## 2020-10-21 MED FILL — Ezetimibe Tab 10 MG: ORAL | 90 days supply | Qty: 90 | Fill #0 | Status: AC

## 2020-10-21 NOTE — Assessment & Plan Note (Signed)
Significantly elevated Epworth score.  Polysomnogram ordered.  He says he did not get much of any sleep over the last 81month.  Is a combination of insomnia and then nonrestful sleep.  I do think that he would benefit from sleep medicine evaluation more so than just OSA evaluation.

## 2020-10-21 NOTE — Assessment & Plan Note (Signed)
Blood pressure pretty well controlled on Lopressor 50 mg twice daily.  Tolerating switching amlodipine 2.5 mg to p.m.

## 2020-10-21 NOTE — Assessment & Plan Note (Signed)
Doing well.  No longer having orthostatic symptoms.  We have taken him off ACE inhibitor/ARB medications to allow addition of amlodipine for antianginal benefit.  Blood pressure stable now, would avoid titrating further.

## 2020-10-21 NOTE — Assessment & Plan Note (Signed)
Unfortunately, he has not yet started Zetia and therefore we should not recheck his labs at this point.  Plan will be to have him start Zetia and then recheck labs in the end of September/early October.  Next option would be to consider Nexletol.

## 2020-10-21 NOTE — Assessment & Plan Note (Signed)
Prior infarct noted on Myoview with anterior apical defect.  This probably goes along with the occluded vein graft to the diagonal.  There also could be combination of septal bounce postoperatively.

## 2020-10-21 NOTE — Assessment & Plan Note (Addendum)
Multivessel CAD with CABG and occluded vein graft to the diagonal.  Also native RCA is occluded-jeopardizing the PDA and diagonal branch. Angina symptoms pretty well controlled with a combination of Ranexa 500 mg twice daily and amlodipine 2.5 mg nightly along with metoprolol tartrate 50 mg twice daily.  Plan:   Continue current antianginal therapy as noted  Continue Plavix monotherapy along with rosuvastatin and Zetia.  Okay to hold Plavix 5 days preop for surgery procedures.  (7 days for more high risk procedures.)

## 2020-10-21 NOTE — Assessment & Plan Note (Signed)
Notably controlled on beta-blocker.

## 2020-10-21 NOTE — Patient Instructions (Addendum)
Medication Instructions:  Continue current medications  Start taking the Ezetimibe( Zetia) 10 mg one tablet daily    It will be okay to hold Plavix ( clopidogrel ) if need for procedures - the medical office will need to sent a official clearance for directions    *If you need a refill on your cardiac medications before your next appointment, please call your pharmacy*   Lab Work: [place]- Sept/Oct 2022   If you have labs (blood work) drawn today and your tests are completely normal, you will receive your results only by: [place] (if you have MyChart) OR A paper copy in the mail If you have any lab test that is abnormal or we need to change your treatment, we will call you to review the results.   Testing/Procedures:  Not needed  Follow-Up: At [facility], you and your health needs are our priority.  As part of our continuing mission to provide you with exceptional heart care, we have created designated Provider Care Teams.  These Care Teams include your primary Cardiologist (physician) and Advanced Practice Providers (APPs -  Physician Assistants and Nurse Practitioners) who all work together to provide you with the care you need, when you need it.     Your next appointment:   6 month(s)  The format for your next appointment:   In Person  Provider:   Glenetta Hew, MD   Other Instructions

## 2020-10-21 NOTE — Assessment & Plan Note (Addendum)
Occluded vein graft to diagonal branch.   I Suspect That the Diagonal was occluded because of competitive flow from the LIMA and native LAD.  Angeles is very well controlled with addition of amlodipine 2.5 mg nightly and Ranexa 500 mg twice daily.

## 2020-10-25 ENCOUNTER — Other Ambulatory Visit: Payer: Self-pay

## 2020-10-25 ENCOUNTER — Ambulatory Visit: Payer: 59 | Admitting: Internal Medicine

## 2020-10-25 DIAGNOSIS — G4710 Hypersomnia, unspecified: Secondary | ICD-10-CM

## 2020-10-25 DIAGNOSIS — R0683 Snoring: Secondary | ICD-10-CM

## 2020-10-25 DIAGNOSIS — R0681 Apnea, not elsewhere classified: Secondary | ICD-10-CM

## 2020-10-29 ENCOUNTER — Ambulatory Visit: Payer: 59 | Attending: Internal Medicine | Admitting: Internal Medicine

## 2020-10-29 ENCOUNTER — Other Ambulatory Visit: Payer: Self-pay

## 2020-10-29 DIAGNOSIS — R0683 Snoring: Secondary | ICD-10-CM | POA: Diagnosis not present

## 2020-10-29 DIAGNOSIS — G4733 Obstructive sleep apnea (adult) (pediatric): Secondary | ICD-10-CM | POA: Insufficient documentation

## 2020-10-29 DIAGNOSIS — G4710 Hypersomnia, unspecified: Secondary | ICD-10-CM | POA: Diagnosis not present

## 2020-10-29 DIAGNOSIS — R0681 Apnea, not elsewhere classified: Secondary | ICD-10-CM

## 2020-10-31 ENCOUNTER — Encounter: Payer: 59 | Admitting: Internal Medicine

## 2020-11-01 ENCOUNTER — Other Ambulatory Visit (HOSPITAL_COMMUNITY): Payer: Self-pay

## 2020-11-07 DIAGNOSIS — G4733 Obstructive sleep apnea (adult) (pediatric): Secondary | ICD-10-CM | POA: Diagnosis not present

## 2020-11-08 DIAGNOSIS — E559 Vitamin D deficiency, unspecified: Secondary | ICD-10-CM | POA: Diagnosis not present

## 2020-11-08 DIAGNOSIS — R5383 Other fatigue: Secondary | ICD-10-CM | POA: Diagnosis not present

## 2020-11-08 DIAGNOSIS — E291 Testicular hypofunction: Secondary | ICD-10-CM | POA: Diagnosis not present

## 2020-11-08 DIAGNOSIS — E034 Atrophy of thyroid (acquired): Secondary | ICD-10-CM | POA: Diagnosis not present

## 2020-11-08 NOTE — Procedures (Signed)
    NAME: Daniel Buck DATE OF BIRTH:  02-Sep-1965 MEDICAL RECORD NUMBER [id number]  LOCATION: [facility]  PHYSICIAN: Marius Ditch  DATE OF STUDY: 10/29/2020  SLEEP STUDY TYPE: Out of Center Sleep Test                REFERRING PHYSICIAN: Marius Ditch, MD  EPWORTH SLEEPINESS SCORE:  NA HEIGHT:    WEIGHT:      There is no height or weight on file to calculate BMI.  NECK SIZE:   in.  CLINICAL INFORMATION The patient was referred to the sleep center for evaluation of excessive daytime sleepiness, snoring, and witnessed apnea  MEDICATIONS No sleep medicine administered.[name]  SLEEP STUDY TECHNIQUE A multi-channel overnight portable sleep study was performed. The channels recorded were: nasal and oral airflow, thoracic and abdominal respiratory movement, and oxygen saturation with a pulse oximetry. Snoring and body position were also monitored.  TECHNICIAN COMMENTS Comments added by Technician: N/A Comments added by Scorer: N/A  RECORDING SUMMARY The study was initiated at 9:00:02 PM and terminated at 8:59:56 AM. The total recorded time was 719.9 minutes. Time in bed was 719.9 minutes.  RESPIRATORY PARAMETERS The overall AHI was 5.3 per hour, with a central apnea index of 0 per hour. The patient was supine for 31.7%. The oxygen nadir was 87% during sleep. Numerous flow restrictions consistent with RERAs were noted.   CARDIAC DATA Mean heart rate during sleep was 74.2 bpm.  IMPRESSIONS - Mild Obstructive Sleep Apnea (OSA)  DIAGNOSIS - Obstructive Sleep Apnea (G47.33)  RECOMMENDATIONS - This study does show mild OSA. There is a suspicion that many RERAs are also present upon review of the tracings. Given patient's symptoms, auto-adjusting CPAP or therapeutic CPAP titration to determine optimal pressure required to alleviate sleep disordered breathing is suggested. Alternatively, if the patient is not willing to try therapy, an in-lab PSG could be done  to be more confident of the severity of his OSA.   Marius Ditch Sleep specialist, [place] Board of Internal Medicine  ELECTRONICALLY SIGNED ON:  11/08/2020, 8:11 PM [place] PH: [phone]   FX: [phone] [place]

## 2020-11-19 ENCOUNTER — Other Ambulatory Visit (HOSPITAL_COMMUNITY): Payer: Self-pay

## 2020-11-19 ENCOUNTER — Other Ambulatory Visit: Payer: Self-pay | Admitting: Cardiology

## 2020-11-22 ENCOUNTER — Other Ambulatory Visit: Payer: Self-pay | Admitting: Cardiology

## 2020-11-22 ENCOUNTER — Other Ambulatory Visit (HOSPITAL_COMMUNITY): Payer: Self-pay

## 2020-11-22 IMAGING — DX DG CHEST 2V
2 series · 2 of 2 positions shown · non-contrast
Comparison: 09/08/2019

CLINICAL DATA: Status post CABG

EXAM:
CHEST - 2 VIEW

[chest pa]
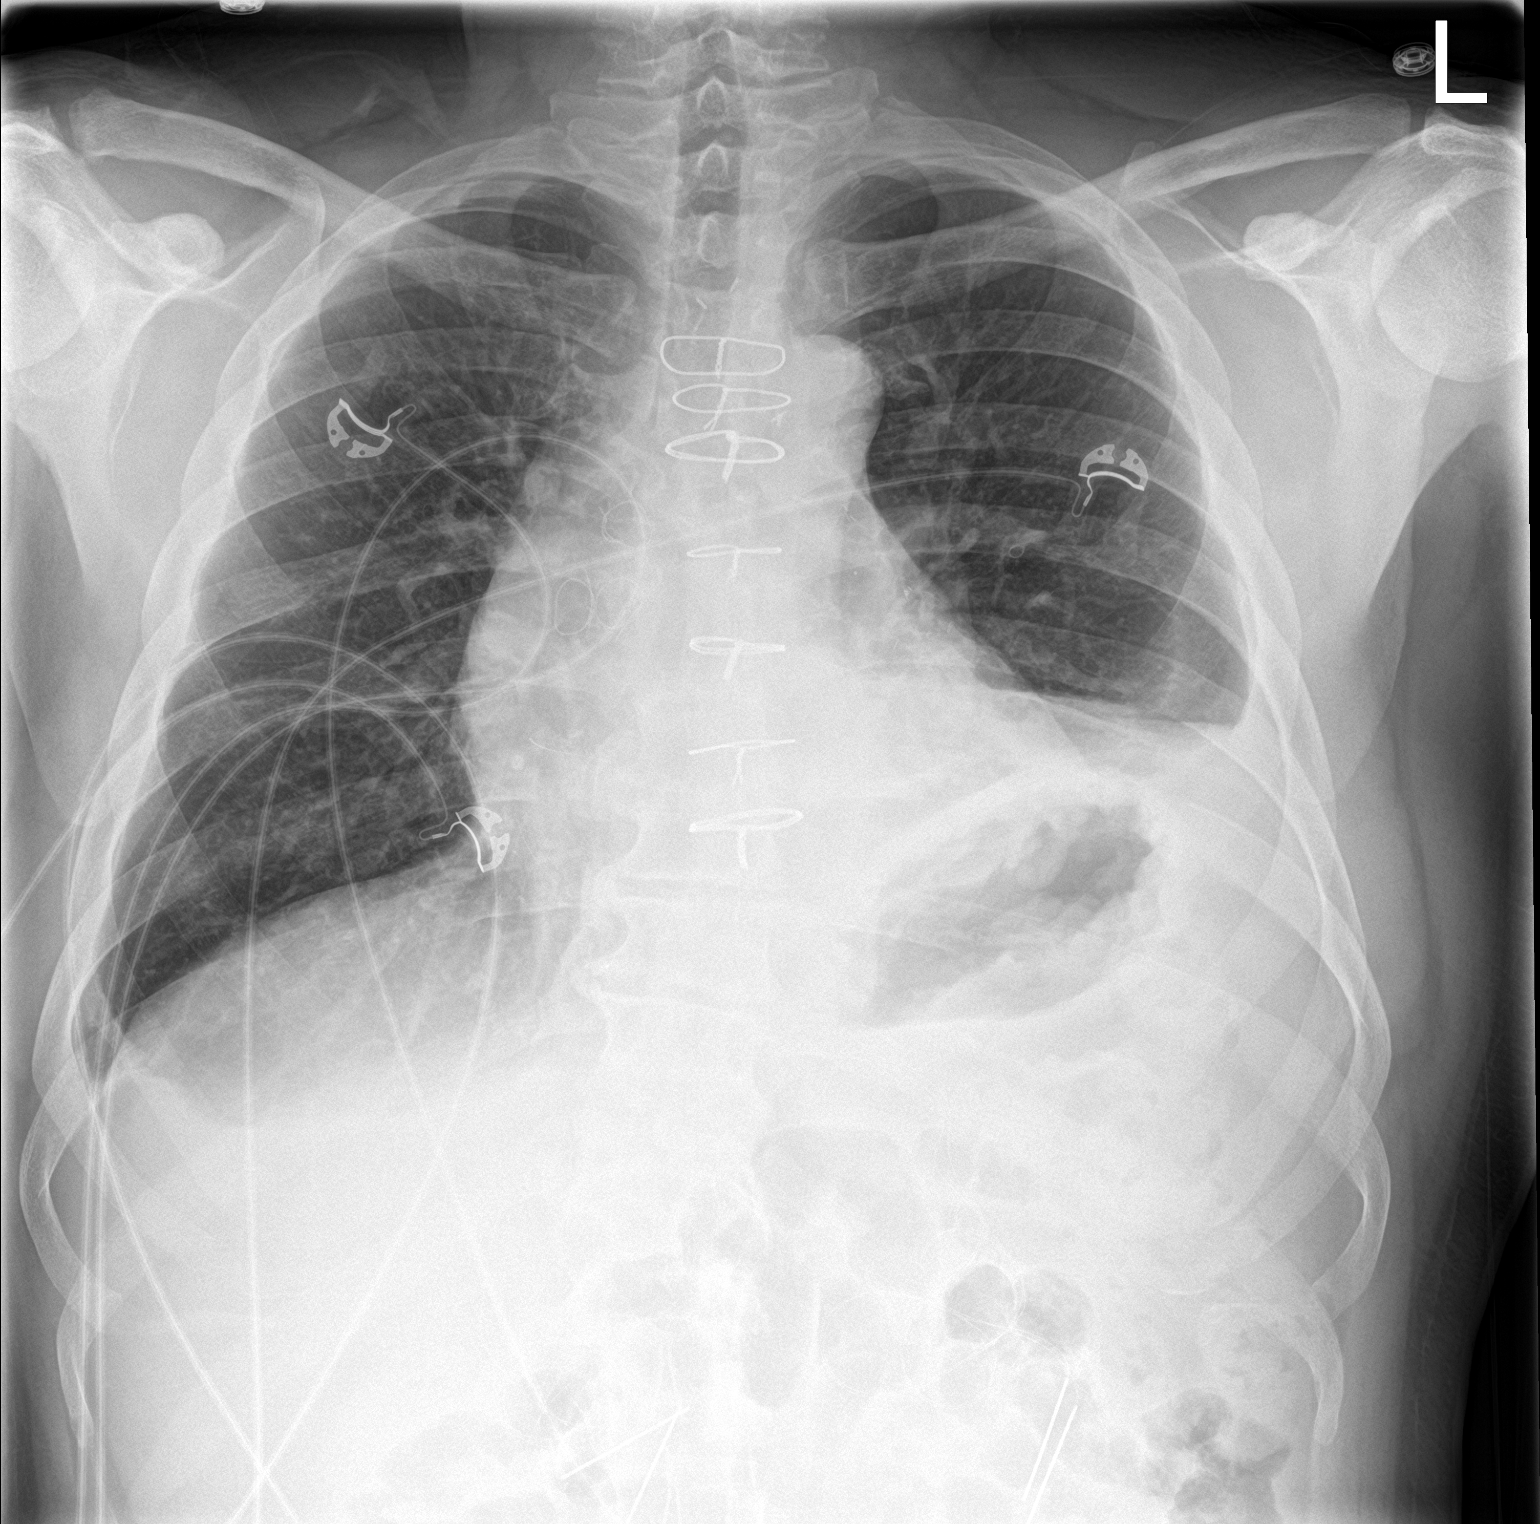

[chest lat]
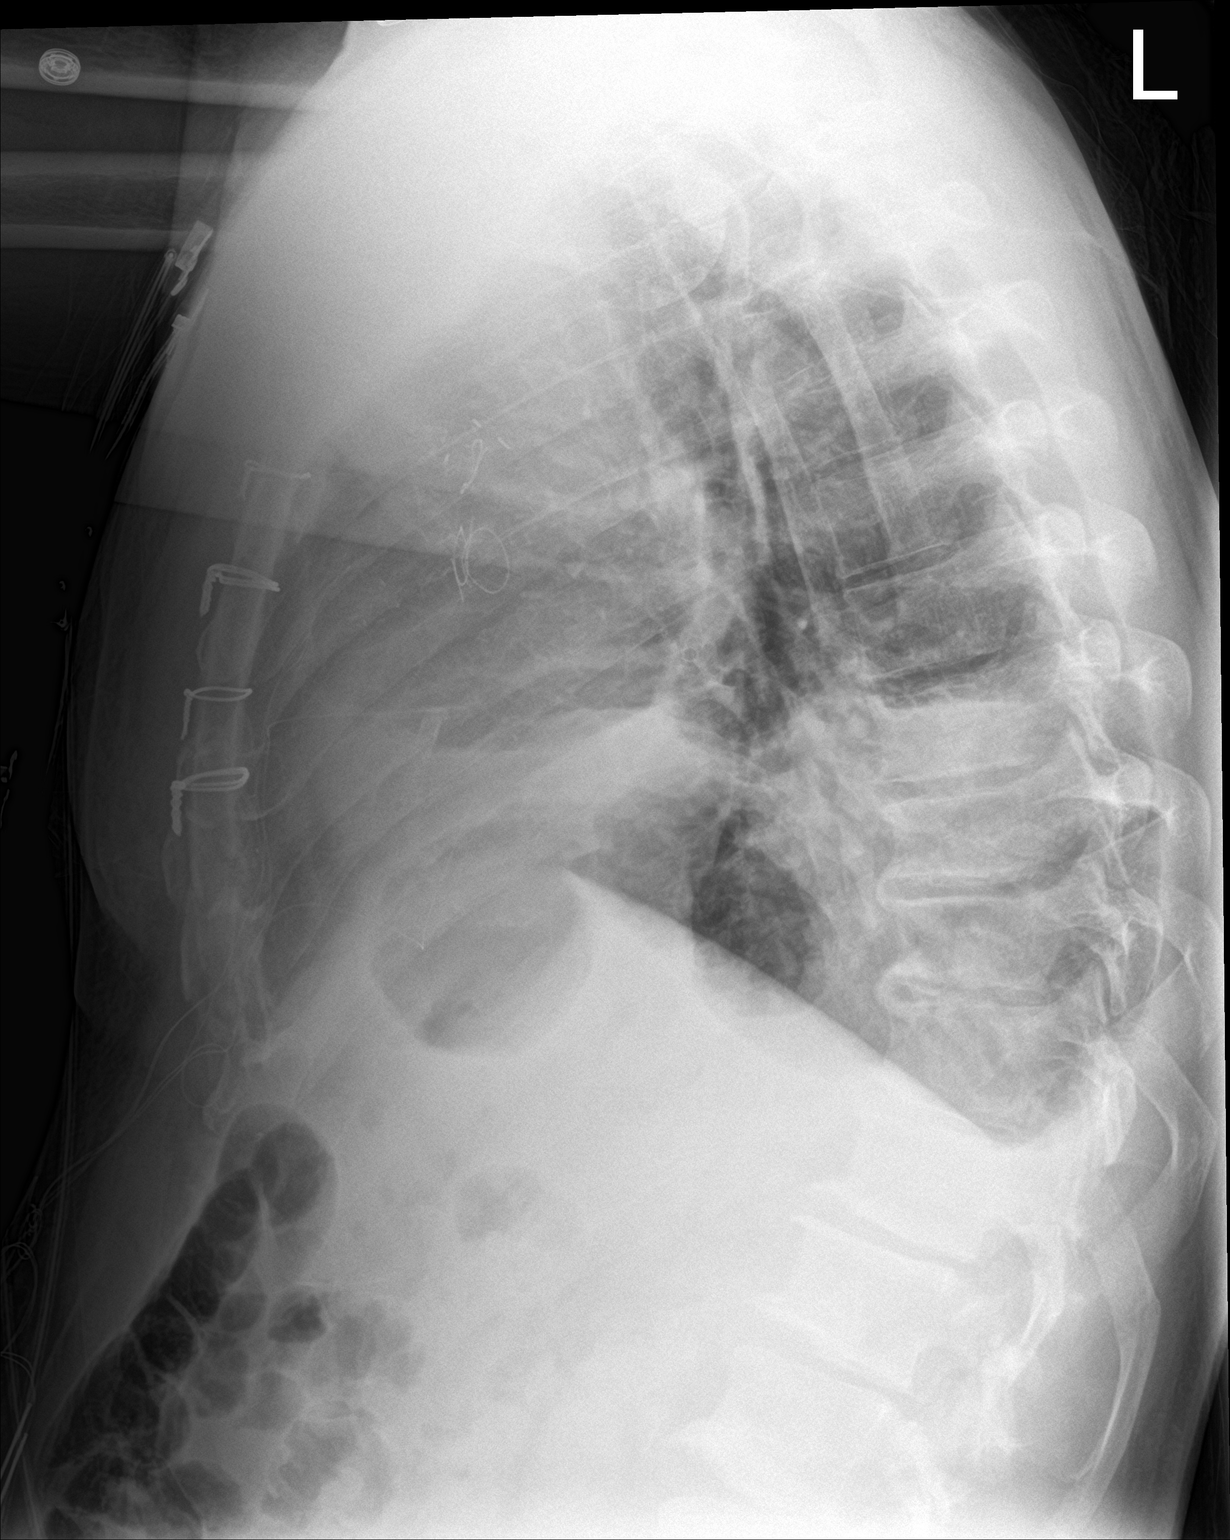

[2 of 2 positions shown; findings below may reference images not displayed]

FINDINGS: Moderate left pleural effusion with eventration of the left
hemidiaphragm. Trace right pleural effusion. No pneumothorax.

The heart is normal in size. Postsurgical changes related to prior
CABG.

Mild degenerative changes of the lower thoracic spine. Median
sternotomy.
IMPRESSION: Moderate left pleural effusion with eventration of the left
hemidiaphragm. Trace right pleural effusion. No pneumothorax.

## 2020-11-24 ENCOUNTER — Other Ambulatory Visit (HOSPITAL_COMMUNITY): Payer: Self-pay

## 2020-11-24 ENCOUNTER — Other Ambulatory Visit: Payer: Self-pay

## 2020-11-24 MED ORDER — CLOPIDOGREL BISULFATE 75 MG PO TABS
75.0000 mg | ORAL_TABLET | Freq: Every day | ORAL | 1 refills | Status: DC
  Filled 2020-11-24: qty 90, 90d supply, fill #0
  Filled 2021-02-20: qty 90, 90d supply, fill #1

## 2020-11-24 NOTE — Telephone Encounter (Signed)
PT WIFE CALLED IN STATED THAT PLAVIX WAS DENIED AND THEY WANT TO KNOW WHY I WILL ROUTE THIS TO NL REFILLS TEAM AS THEY ARE REQUESTING A REFILL.

## 2020-11-30 ENCOUNTER — Other Ambulatory Visit (HOSPITAL_COMMUNITY): Payer: Self-pay

## 2020-12-02 IMAGING — CR DG CHEST 2V
2 series · 2 of 2 positions shown · non-contrast
Comparison: 09/09/2019

CLINICAL DATA: Chest pain.  Status post CABG 09/04/2019.

EXAM:
CHEST - 2 VIEW

[w chest pa]
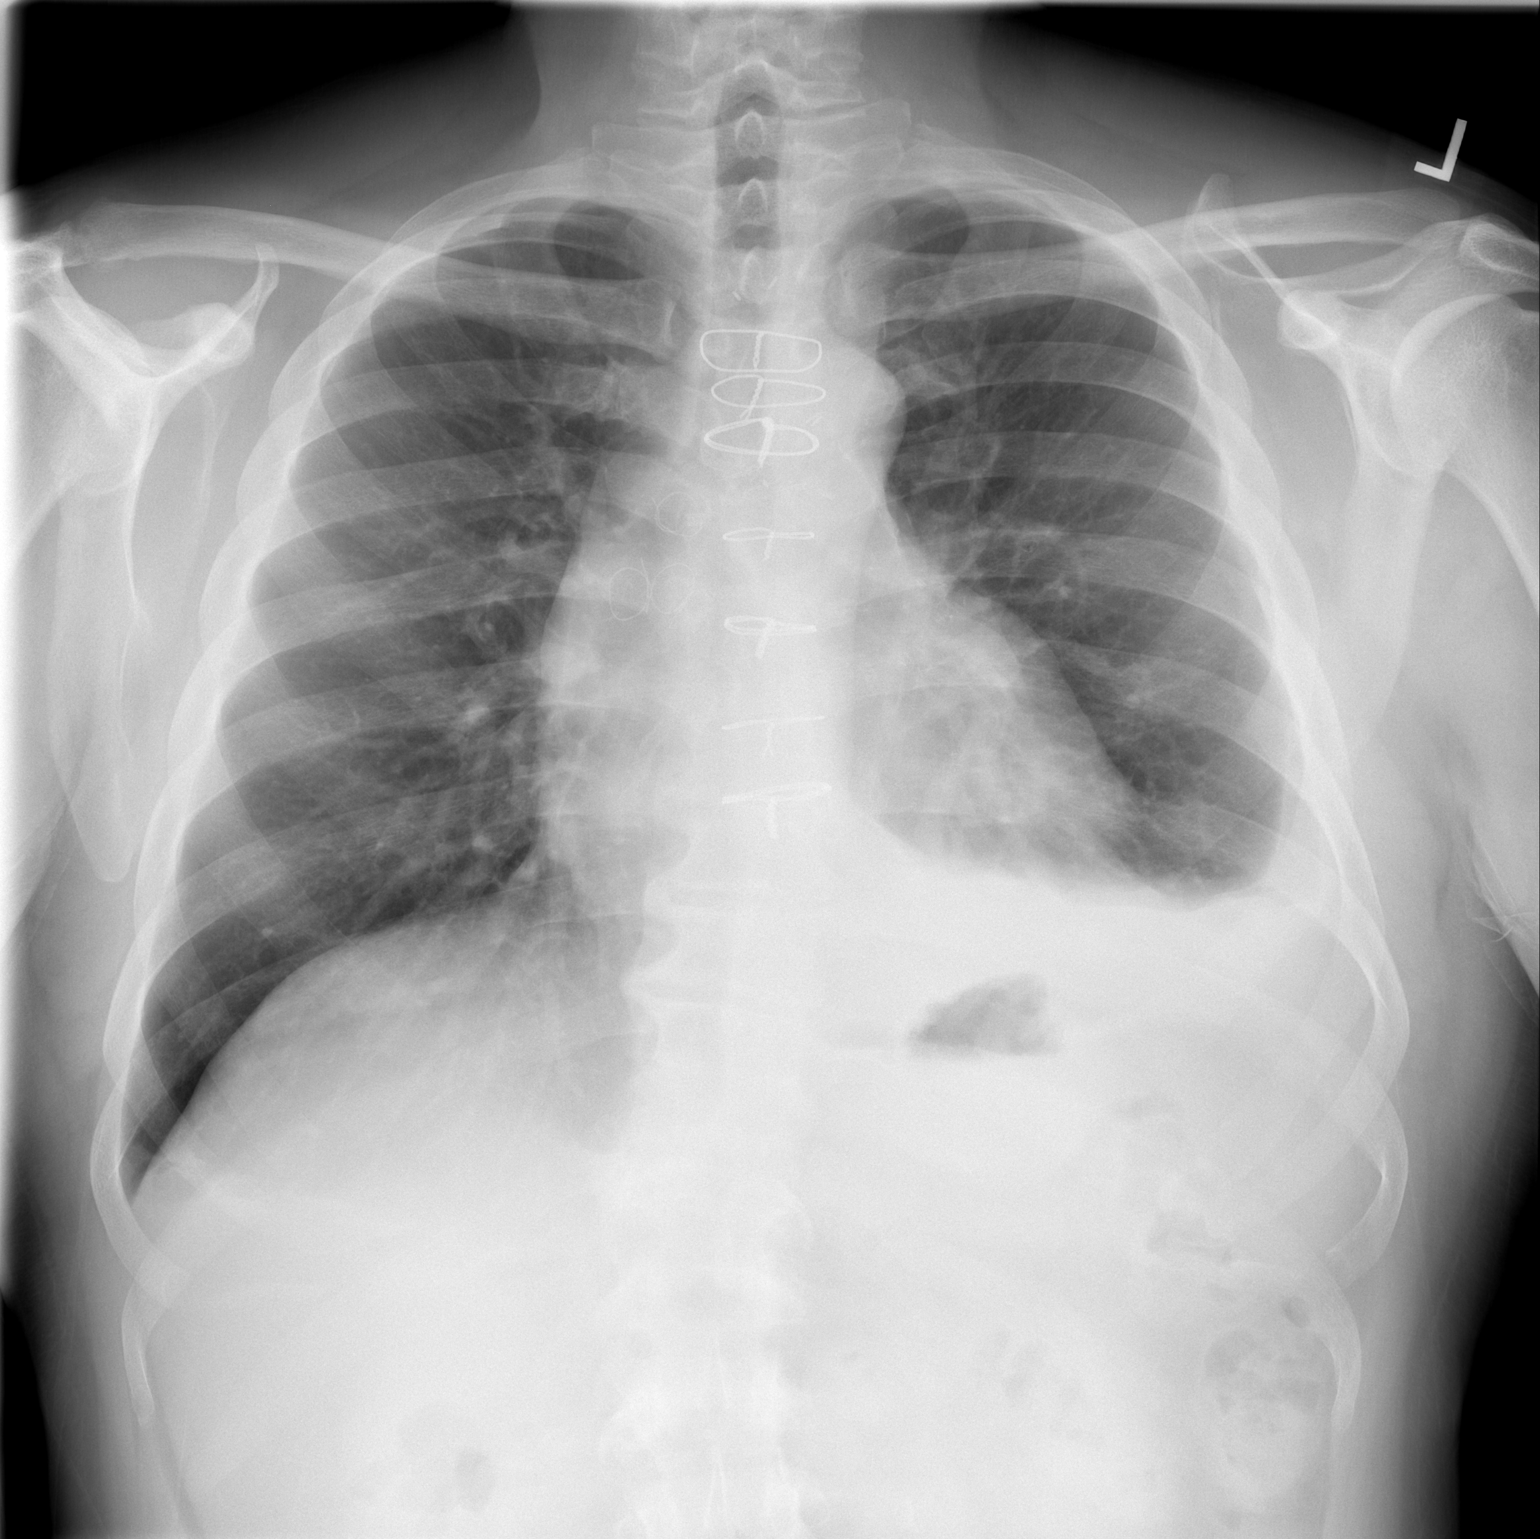

[w chest lat]
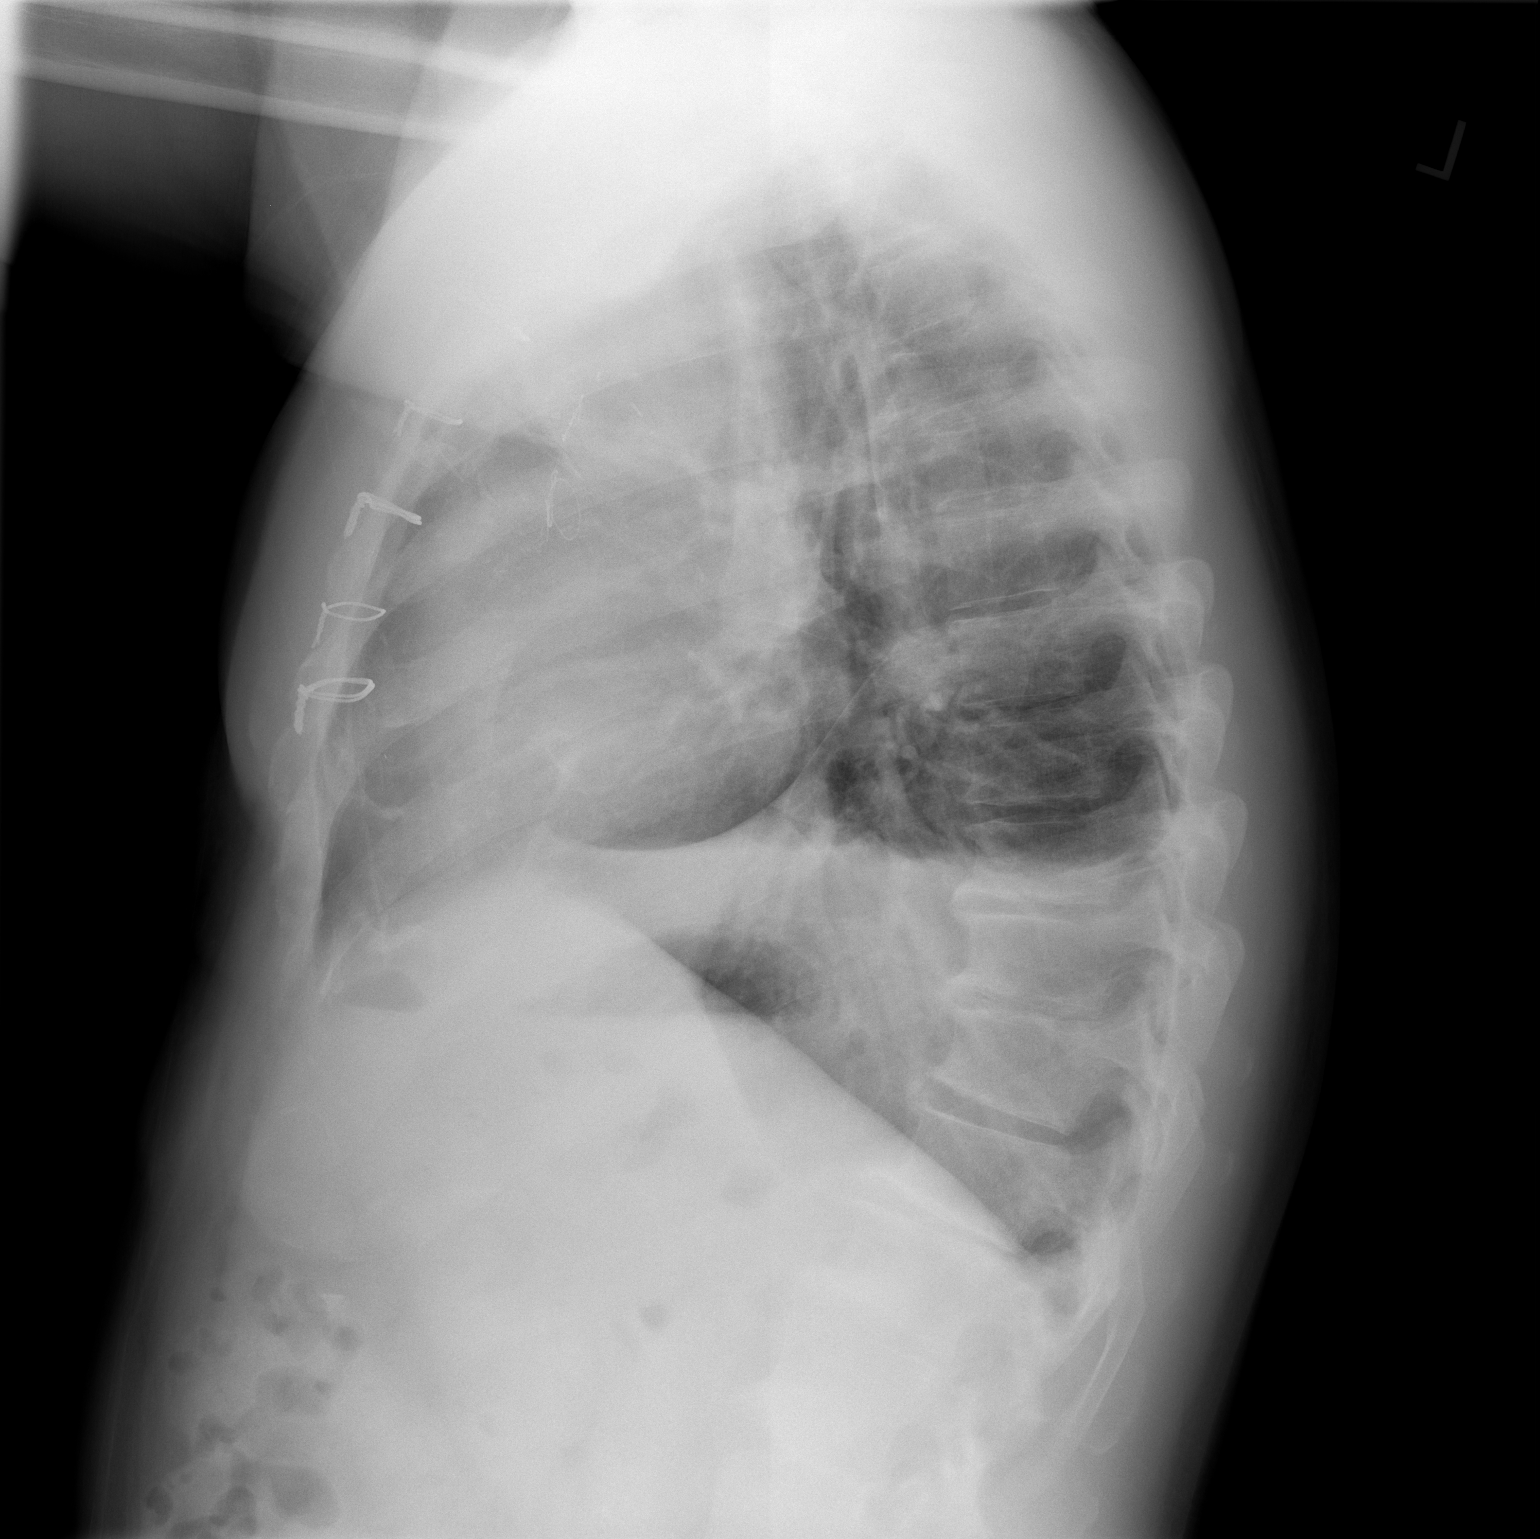

[2 of 2 positions shown; findings below may reference images not displayed]

FINDINGS: Right lung clear. Left base atelectasis with small to moderate left
pleural effusion noted. Cardiopericardial silhouette is at upper
limits of normal for size. The visualized bony structures of the
thorax are intact.
IMPRESSION: Left base atelectasis with small to moderate left pleural effusion.

## 2020-12-08 DIAGNOSIS — M25531 Pain in right wrist: Secondary | ICD-10-CM | POA: Diagnosis not present

## 2020-12-08 DIAGNOSIS — G5601 Carpal tunnel syndrome, right upper limb: Secondary | ICD-10-CM | POA: Diagnosis not present

## 2020-12-13 ENCOUNTER — Telehealth: Payer: Self-pay | Admitting: *Deleted

## 2020-12-13 NOTE — Telephone Encounter (Signed)
   [facility] Pre-operative Risk Assessment    Patient Name: Daniel Buck  DOB: [birth year] MRN: [id number]  HEARTCARE STAFF:  - IMPORTANT!!!!!! Under Visit Info/Reason for Call, type in Other and utilize the format Clearance MM/DD/YY or Clearance TBD. Do not use dashes or single digits. - Please review there is not already an duplicate clearance open for this procedure. - If request is for dental extraction, please clarify the # of teeth to be extracted. - If the patient is currently at the dentist's office, call Pre-Op Callback Staff (MA/nurse) to input urgent request.  - If the patient is not currently in the dentist office, please route to the Pre-Op pool.  Request for surgical clearance:  What type of surgery is being performed?              Right carpal tunnel release   When is this surgery scheduled? tbd  What type of clearance is required (medical clearance vs. Pharmacy clearance to hold med vs. Both)? Medical   Are there any medications that need to be held prior to surgery and how long? Plavix   Practice name and name of physician performing surgery?  [facility] : DR Daniel Buck  What is the office phone number? [phone]   7.   What is the office fax number? [place]  8.   Anesthesia type (None, local, MAC, general) ? Local    Daniel Buck 12/13/2020, 3:10 PM  _________________________________________________________________   (provider comments below)

## 2020-12-13 NOTE — Telephone Encounter (Signed)
   Primary Cardiologist: Glenetta Hew, MD  Chart reviewed as part of pre-operative protocol coverage. Given past medical history and time since last visit, based on ACC/AHA guidelines, Daniel Buck would be at acceptable risk for the planned procedure without further cardiovascular testing.   His Plavix may be held for 5 days prior to his procedure.  Please resume as soon as hemostasis is achieved.  I will route this recommendation to the requesting party via Epic fax function and remove from pre-op pool.  Please call with questions. Jossie Ng. Dannae Kato NP-C    12/13/2020, 3:30 PM [place] [address] Office [phone] Fax [phone] @ME1 @ 12/13/2020, 3:30 PM

## 2020-12-22 ENCOUNTER — Other Ambulatory Visit (HOSPITAL_COMMUNITY): Payer: Self-pay

## 2020-12-22 MED ORDER — MESALAMINE 1.2 G PO TBEC
4.8000 g | DELAYED_RELEASE_TABLET | Freq: Every day | ORAL | 1 refills | Status: DC
  Filled 2020-12-24: qty 120, 30d supply, fill #0
  Filled 2021-03-21: qty 120, 30d supply, fill #1

## 2020-12-24 ENCOUNTER — Other Ambulatory Visit (HOSPITAL_COMMUNITY): Payer: Self-pay

## 2020-12-24 MED ORDER — CEPHALEXIN 500 MG PO CAPS
500.0000 mg | ORAL_CAPSULE | Freq: Four times a day (QID) | ORAL | 0 refills | Status: DC
  Filled 2020-12-24: qty 32, 8d supply, fill #0

## 2020-12-25 ENCOUNTER — Other Ambulatory Visit: Payer: Self-pay | Admitting: Cardiology

## 2020-12-27 ENCOUNTER — Other Ambulatory Visit (HOSPITAL_COMMUNITY): Payer: Self-pay

## 2020-12-27 MED ORDER — METOPROLOL TARTRATE 50 MG PO TABS
50.0000 mg | ORAL_TABLET | Freq: Two times a day (BID) | ORAL | 3 refills | Status: DC
  Filled 2020-12-27: qty 180, 90d supply, fill #0
  Filled 2021-03-21: qty 180, 90d supply, fill #1
  Filled 2021-06-13: qty 180, 90d supply, fill #2
  Filled 2021-09-14: qty 180, 90d supply, fill #3

## 2020-12-28 DIAGNOSIS — H35361 Drusen (degenerative) of macula, right eye: Secondary | ICD-10-CM | POA: Diagnosis not present

## 2020-12-28 DIAGNOSIS — H35033 Hypertensive retinopathy, bilateral: Secondary | ICD-10-CM | POA: Diagnosis not present

## 2020-12-28 DIAGNOSIS — H43823 Vitreomacular adhesion, bilateral: Secondary | ICD-10-CM | POA: Diagnosis not present

## 2020-12-28 DIAGNOSIS — H2513 Age-related nuclear cataract, bilateral: Secondary | ICD-10-CM | POA: Diagnosis not present

## 2021-01-16 MED FILL — Ezetimibe Tab 10 MG: ORAL | 90 days supply | Qty: 90 | Fill #1 | Status: AC

## 2021-01-17 ENCOUNTER — Other Ambulatory Visit (HOSPITAL_COMMUNITY): Payer: Self-pay

## 2021-01-25 ENCOUNTER — Other Ambulatory Visit (HOSPITAL_COMMUNITY): Payer: Self-pay

## 2021-01-25 DIAGNOSIS — R6882 Decreased libido: Secondary | ICD-10-CM | POA: Diagnosis not present

## 2021-01-25 DIAGNOSIS — M2550 Pain in unspecified joint: Secondary | ICD-10-CM | POA: Diagnosis not present

## 2021-01-25 DIAGNOSIS — Z6831 Body mass index (BMI) 31.0-31.9, adult: Secondary | ICD-10-CM | POA: Diagnosis not present

## 2021-01-25 DIAGNOSIS — E291 Testicular hypofunction: Secondary | ICD-10-CM | POA: Diagnosis not present

## 2021-01-25 DIAGNOSIS — R5383 Other fatigue: Secondary | ICD-10-CM | POA: Diagnosis not present

## 2021-01-25 MED ORDER — ANASTROZOLE 1 MG PO TABS
0.5000 mg | ORAL_TABLET | ORAL | 1 refills | Status: DC
  Filled 2021-01-25: qty 6, 84d supply, fill #0
  Filled 2021-04-02: qty 6, 84d supply, fill #1
  Filled 2021-06-02: qty 6, 84d supply, fill #2

## 2021-01-26 ENCOUNTER — Other Ambulatory Visit (HOSPITAL_COMMUNITY): Payer: Self-pay

## 2021-01-31 ENCOUNTER — Other Ambulatory Visit (HOSPITAL_COMMUNITY): Payer: Self-pay

## 2021-02-21 ENCOUNTER — Other Ambulatory Visit (HOSPITAL_COMMUNITY): Payer: Self-pay

## 2021-02-21 DIAGNOSIS — Z125 Encounter for screening for malignant neoplasm of prostate: Secondary | ICD-10-CM | POA: Diagnosis not present

## 2021-02-21 DIAGNOSIS — E291 Testicular hypofunction: Secondary | ICD-10-CM | POA: Diagnosis not present

## 2021-02-21 DIAGNOSIS — R5383 Other fatigue: Secondary | ICD-10-CM | POA: Diagnosis not present

## 2021-02-21 MED FILL — Esomeprazole Magnesium Cap Delayed Release 40 MG (Base Eq): ORAL | 90 days supply | Qty: 180 | Fill #0 | Status: AC

## 2021-02-22 DIAGNOSIS — H524 Presbyopia: Secondary | ICD-10-CM | POA: Diagnosis not present

## 2021-02-24 DIAGNOSIS — E291 Testicular hypofunction: Secondary | ICD-10-CM | POA: Diagnosis not present

## 2021-02-24 DIAGNOSIS — R5383 Other fatigue: Secondary | ICD-10-CM | POA: Diagnosis not present

## 2021-02-24 DIAGNOSIS — Z6831 Body mass index (BMI) 31.0-31.9, adult: Secondary | ICD-10-CM | POA: Diagnosis not present

## 2021-02-24 DIAGNOSIS — R6882 Decreased libido: Secondary | ICD-10-CM | POA: Diagnosis not present

## 2021-03-02 ENCOUNTER — Other Ambulatory Visit (HOSPITAL_COMMUNITY): Payer: Self-pay

## 2021-03-16 ENCOUNTER — Other Ambulatory Visit: Payer: Self-pay | Admitting: Family Medicine

## 2021-03-16 ENCOUNTER — Ambulatory Visit
Admission: RE | Admit: 2021-03-16 | Discharge: 2021-03-16 | Disposition: A | Discharge status: Home / Self Care | Payer: 59 | Source: Ambulatory Visit | Attending: Family Medicine | Admitting: Family Medicine

## 2021-03-16 DIAGNOSIS — M25562 Pain in left knee: Secondary | ICD-10-CM

## 2021-03-16 DIAGNOSIS — M79672 Pain in left foot: Secondary | ICD-10-CM | POA: Diagnosis not present

## 2021-03-22 ENCOUNTER — Other Ambulatory Visit (HOSPITAL_COMMUNITY): Payer: Self-pay

## 2021-03-24 DIAGNOSIS — G5601 Carpal tunnel syndrome, right upper limb: Secondary | ICD-10-CM | POA: Diagnosis not present

## 2021-03-24 DIAGNOSIS — Z79890 Hormone replacement therapy (postmenopausal): Secondary | ICD-10-CM | POA: Diagnosis not present

## 2021-03-24 DIAGNOSIS — E291 Testicular hypofunction: Secondary | ICD-10-CM | POA: Diagnosis not present

## 2021-03-24 DIAGNOSIS — Z4789 Encounter for other orthopedic aftercare: Secondary | ICD-10-CM | POA: Diagnosis not present

## 2021-03-24 DIAGNOSIS — E785 Hyperlipidemia, unspecified: Secondary | ICD-10-CM | POA: Diagnosis not present

## 2021-03-24 DIAGNOSIS — I25709 Atherosclerosis of coronary artery bypass graft(s), unspecified, with unspecified angina pectoris: Secondary | ICD-10-CM | POA: Diagnosis not present

## 2021-03-24 DIAGNOSIS — R5383 Other fatigue: Secondary | ICD-10-CM | POA: Diagnosis not present

## 2021-03-25 LAB — COMPREHENSIVE METABOLIC PANEL
ALT: 36 IU/L (ref 0–44)
AST: 46 IU/L — ABNORMAL HIGH (ref 0–40)
Albumin/Globulin Ratio: 2.1 (ref 1.2–2.2)
Albumin: 4.9 g/dL (ref 3.8–4.9)
Alkaline Phosphatase: 76 IU/L (ref 44–121)
BUN/Creatinine Ratio: 14 (ref 9–20)
BUN: 18 mg/dL (ref 6–24)
Bilirubin Total: 0.4 mg/dL (ref 0.0–1.2)
CO2: 23 mmol/L (ref 20–29)
Calcium: 9.7 mg/dL (ref 8.7–10.2)
Chloride: 104 mmol/L (ref 96–106)
Creatinine, Ser: 1.28 mg/dL — ABNORMAL HIGH (ref 0.76–1.27)
Globulin, Total: 2.3 g/dL (ref 1.5–4.5)
Glucose: 108 mg/dL — ABNORMAL HIGH (ref 70–99)
Potassium: 4.6 mmol/L (ref 3.5–5.2)
Sodium: 143 mmol/L (ref 134–144)
Total Protein: 7.2 g/dL (ref 6.0–8.5)
eGFR: 66 mL/min/{1.73_m2} (ref >59)

## 2021-03-25 LAB — LIPID PANEL
Chol/HDL Ratio: 3.5 ratio (ref 0.0–5.0)
Cholesterol, Total: 94 mg/dL — ABNORMAL LOW (ref 100–199)
HDL: 27 mg/dL — ABNORMAL LOW (ref >39)
LDL Chol Calc (NIH): 49 mg/dL (ref 0–99)
Triglycerides: 87 mg/dL (ref 0–149)
VLDL Cholesterol Cal: 18 mg/dL (ref 5–40)

## 2021-03-31 DIAGNOSIS — R6882 Decreased libido: Secondary | ICD-10-CM | POA: Diagnosis not present

## 2021-03-31 DIAGNOSIS — E291 Testicular hypofunction: Secondary | ICD-10-CM | POA: Diagnosis not present

## 2021-03-31 DIAGNOSIS — Z6831 Body mass index (BMI) 31.0-31.9, adult: Secondary | ICD-10-CM | POA: Diagnosis not present

## 2021-03-31 DIAGNOSIS — R5383 Other fatigue: Secondary | ICD-10-CM | POA: Diagnosis not present

## 2021-04-04 ENCOUNTER — Other Ambulatory Visit (HOSPITAL_COMMUNITY): Payer: Self-pay

## 2021-04-07 DIAGNOSIS — M25531 Pain in right wrist: Secondary | ICD-10-CM | POA: Diagnosis not present

## 2021-04-14 DIAGNOSIS — M79672 Pain in left foot: Secondary | ICD-10-CM | POA: Diagnosis not present

## 2021-04-15 ENCOUNTER — Other Ambulatory Visit (HOSPITAL_COMMUNITY): Payer: Self-pay

## 2021-04-15 MED FILL — Ezetimibe Tab 10 MG: ORAL | 90 days supply | Qty: 90 | Fill #2 | Status: AC

## 2021-04-20 ENCOUNTER — Other Ambulatory Visit (HOSPITAL_COMMUNITY): Payer: Self-pay

## 2021-04-20 ENCOUNTER — Encounter: Payer: Self-pay | Admitting: Cardiology

## 2021-04-20 ENCOUNTER — Ambulatory Visit: Payer: 59 | Admitting: Cardiology

## 2021-04-20 ENCOUNTER — Other Ambulatory Visit: Payer: Self-pay

## 2021-04-20 VITALS — BP 124/72 | HR 75 | Ht 71.0 in | Wt 218.6 lb

## 2021-04-20 DIAGNOSIS — N5201 Erectile dysfunction due to arterial insufficiency: Secondary | ICD-10-CM

## 2021-04-20 DIAGNOSIS — R5383 Other fatigue: Secondary | ICD-10-CM

## 2021-04-20 DIAGNOSIS — I252 Old myocardial infarction: Secondary | ICD-10-CM | POA: Diagnosis not present

## 2021-04-20 DIAGNOSIS — I10 Essential (primary) hypertension: Secondary | ICD-10-CM | POA: Diagnosis not present

## 2021-04-20 DIAGNOSIS — R42 Dizziness and giddiness: Secondary | ICD-10-CM

## 2021-04-20 DIAGNOSIS — Z9861 Coronary angioplasty status: Secondary | ICD-10-CM

## 2021-04-20 DIAGNOSIS — I25119 Atherosclerotic heart disease of native coronary artery with unspecified angina pectoris: Secondary | ICD-10-CM

## 2021-04-20 DIAGNOSIS — R002 Palpitations: Secondary | ICD-10-CM

## 2021-04-20 DIAGNOSIS — I2510 Atherosclerotic heart disease of native coronary artery without angina pectoris: Secondary | ICD-10-CM | POA: Diagnosis not present

## 2021-04-20 DIAGNOSIS — I25709 Atherosclerosis of coronary artery bypass graft(s), unspecified, with unspecified angina pectoris: Secondary | ICD-10-CM

## 2021-04-20 DIAGNOSIS — E785 Hyperlipidemia, unspecified: Secondary | ICD-10-CM

## 2021-04-20 DIAGNOSIS — G4733 Obstructive sleep apnea (adult) (pediatric): Secondary | ICD-10-CM

## 2021-04-20 DIAGNOSIS — R413 Other amnesia: Secondary | ICD-10-CM

## 2021-04-20 DIAGNOSIS — N529 Male erectile dysfunction, unspecified: Secondary | ICD-10-CM | POA: Insufficient documentation

## 2021-04-20 MED ORDER — SILDENAFIL CITRATE 50 MG PO TABS
25.0000 mg | ORAL_TABLET | ORAL | 11 refills | Status: DC | PRN
  Filled 2021-04-20 (×2): qty 5, 25d supply, fill #0

## 2021-04-20 NOTE — Assessment & Plan Note (Signed)
Energy level notably improved.  He did have a sleep study done that showed mild OSA.  He also indicates that he is currently sleeping better.  As such, he is feeling a little more energetic.  We talked about using Breathe Right strips.  He can also look into getting a dental dam.

## 2021-04-20 NOTE — Progress Notes (Signed)
Primary Care Provider: [name], MD Cardiologist: Glenetta Hew, MD Electrophysiologist: None  Clinic Note: Chief Complaint  Patient presents with   Follow-up    6 months   Coronary Artery Disease    No active angina on current meds.  Only has dyspnea with strenuous exertion   Obstructive Sleep Apnea    Sleep study showed mild sleep apnea, recommended not on CPAP techniques such as Breathe Right strips.  Sleeps much better when he uses it.   Fatigue    Energy level has improved.   ===================================  ASSESSMENT/PLAN   Problem List Items Addressed This Visit       Cardiology Problems   Coronary artery disease involving native coronary artery of native heart with angina pectoris ([place]) - Primary (Chronic)    Chronic stable angina-not unexpected with occluded RCA and SVG-diagonal.: Well-controlled on combination of amlodipine and Ranexa along with Lopressor.  Other than some exertional dyspnea he is doing very well.  Plan:  Continue current low-dose amlodipine 2.5 mg daily along with moderate dose Lopressor 50 mg twice daily and Ranexa 500 mg daily Continue adequate lipid control with current combination of Zetia 10 mg and rosuvastatin 40 mg daily. On long-term Plavix monotherapy Okay to hold Plavix 5 days preop for surgery or procedures.  (7 days for more high risk procedure).        Relevant Medications   sildenafil (VIAGRA) 50 MG tablet   Other Relevant Orders   EKG 12-Lead   Coronary artery disease involving coronary bypass graft of native heart with angina pectoris ([place]) (Chronic)    Jeopardized first diagonal with significant left main and ostial LAD disease, and now occluded SVG.  Clearly there is some area could be to some angina.  Unfortunately, intervening to restore flow to the diagonal would require atherectomy of the left main as well as ostial diagonal.  In doing this, I want to likely jeopardize TID.  Plan: Continue current doses of  beta-blocker, calcium channel blocker and Ranexa for antianginal benefit. On combination rosuvastatin and Zetia for lipid control along with monotherapy Plavix.      Relevant Medications   sildenafil (VIAGRA) 50 MG tablet   Other Relevant Orders   EKG 12-Lead   Essential hypertension, benign (Chronic)    Well-controlled blood pressure on current dose of blood pressure 50 mg twice daily and amlodipine 2.5 mg daily.  Can be switched over to amlodipine from ARB to allow for antianginal benefit.      Relevant Medications   sildenafil (VIAGRA) 50 MG tablet   Myocardial infarct, old (Chronic)    Eccentric wall defect noted on prior Myoview.  This is consistent with occluded SVG-diagonal.  Also EF somewhat reduced by postop septal bounce.  EF still preserved at 55 to 60% with paradoxical septal motion.  No active CHF symptoms      Relevant Medications   sildenafil (VIAGRA) 50 MG tablet   Other Relevant Orders   EKG 12-Lead   CAD S/P percutaneous coronary angioplasty (Chronic)    Okay to hold Plavix 5 to 7 days preop for surgery or procedures.      Relevant Medications   sildenafil (VIAGRA) 50 MG tablet   Other Relevant Orders   EKG 12-Lead   Hyperlipidemia LDL goal <70 (Chronic)    We checked lipids back in the end of December and his LDL was 49.  This indicates significant improvement after initiating Zetia.  Plan: Continue rosuvastatin 40 mg daily along with Zetia 10  mg daily. ->  We will probably recheck lipids if not in June 2023, in the end of the year/December      Relevant Medications   sildenafil (VIAGRA) 50 MG tablet     Other   Erectile dysfunction (Chronic)    As a last minute discussing point, he brought up concerns about ED --> likely related to vascular insufficiency but also antihypertensive agents.  -> Options include holding amlodipine on a as needed basis, but will also provide Viagra (sildenafil) 50 mg tablets -> we will start off with 25 mg tablets, but if  inadequate, increase to 50 mg.  Can take as many as 2 tablets daily as needed.  Must hold nitroglycerin for at least 24 hours pre or post use of sildenafil. Also recommend holding amlodipine if using sildenafil.      Relevant Medications   sildenafil (VIAGRA) 50 MG tablet   OSA (obstructive sleep apnea) (Chronic)    The polysomnogram was read as low level OSA.  Recommending low risk management with dental DM, Breathe Right strips.  He said he slept much better having used Breathe Right strips -> leads to better energy the next day.      Orthostatic dizziness (Chronic)    Doing much better having stopped ACE inhibitor/ARB which is also last use of low-dose of amlodipine.  Ensure adequate hydration, and and avoid rapid standing movement.      Memory loss    No notable benefit from statin holiday.      Fatigue    Energy level notably improved.  He did have a sleep study done that showed mild OSA.  He also indicates that he is currently sleeping better.  As such, he is feeling a little more energetic.  We talked about using Breathe Right strips.  He can also look into getting a dental dam.       Heart palpitations (Chronic)    Well-controlled with current dose of Lopressor 50 mg twice daily      Relevant Orders   EKG 12-Lead    ===================================  HPI:    Daniel Buck is a 56 y.o. male with a complex cardiac history noted below who presents today for 39-monthfollow-up.  Cardiac History 1996: Inferior STEMI-BMS PCI RCA 2002: Recurrent angina-atherectomy and PCI for ISR 08/22/2019: Recurrent Angina-Cath => MV CAD/LM stenosis => CABG  CABG x4 (LIMA-LAD, SVG-OM, SVG-DX, SVG-RPL) Follow-up 11/26/2019: Noted some increased energy level, but not back to previous baseline.  Able to walk 1.5-2 miles/day without chest pain or dyspnea. 06/28/2020: Worsening fatigue, choking sensation of dyspnea and occasional chest tightness.  Occasional orthostatic dizziness Myoview  07/02/2020: INTERMEDIATE RISK. EF ~40%).  Anterolateral ST depression w/ TWI.  Med-sized partially-reversible perfusion defect in anterolateral & apical wall c/w prior Infarct w/ peri-infarct Ischemia. Global HK w/ abn septal WM 2/2 sternotomy.  CATH 07/07/2020: New findings - 100% RCA CTO, 100% CTO prox-mid LCx after OM1 & Occluded SVG-D1 (? Culprit lesion). Stable: Ost-prox LM 50%, distal LM 60%. Ost D1 80%, mid LAD 60% (pre LIMA) -> 40% post Patent LIMA-LAD. OM1 patent w/ diffuse mild disease. Patent SVG-OM2, SVG--RPL1 (no Competitive native flow).  EF ~ 45% w/ Inferolateral HK (see diagram below) => Recommended Echo & started Ranexa 2D Echo 07/16/2020: EF 55 to 60%. Paradoxical septal motion consistent with postop state GR 1 DD.  Normal valves.  Normal RV. No change from 09/13/2017    Daniel Buck was last seen on 08/21/2020.  Still pending sleep  study.  Remains on Ranexa and low-dose amlodipine.  No further chest pain.  No longer on ACE inhibitor/ARB because of orthostatic hypotension.  This allowed for the use of amlodipine along with Lopressor 50 mg twice daily for antianginal benefit.  Palpitations controlled with beta-blocker.  Recent Hospitalizations: None  Reviewed  CV studies:    The following studies were reviewed today: (if available, images/films reviewed: From Epic Chart or Care Everywhere) No new studies:  Interval History:   Daniel Buck returns here today overall doing a lot better.  He says that he has not had any more chest pain since titrating up Ranexa and amlodipine.  He still has a little exertional dyspnea if he lifts something heavy or does something strenuous, but even that has improved.He says that strenuous lifting will make him short of breath, but not routine walking including walking up steps.  Energy level overall is better.  He is sleeping better.  He actually says he feels about the best he has since his bypass surgery.  He was told that he had mild OSA, did not  necessarily require CPAP.  They recommended conservative measures such as Breathe Right strips or dental dam.  He says that he does sleep better when he uses Breathe Right strips.  When he gets better sleep, his energy level is notably improved.  No heart failure or arrhythmia symptoms.  About the only other positive CV review of symptoms is that he has some erectile dysfunction issues.  CV Review of Symptoms (Summary) Cardiovascular ROS: positive for - Shortness of breath with heavy exertion-lifting.  Not with routine activity.  Including walking. -ED negative for - chest pain, edema, irregular heartbeat, orthopnea, palpitations, paroxysmal nocturnal dyspnea, rapid heart rate, shortness of breath, or Syncope/near syncope, TIA/amaurosis fugax, claudication  \ REVIEWED OF SYSTEMS   Review of Systems  Constitutional:  Positive for malaise/fatigue (Notably better energy. ->  Still has some exercise intolerance, but better.). Negative for weight loss.  HENT:  Negative for congestion.   Respiratory:  Positive for shortness of breath (Only if he overdoes it with lifting something heavy). Negative for cough.   Cardiovascular:        Per HPI; ED  Gastrointestinal:  Negative for blood in stool and melena.  Genitourinary:  Negative for hematuria.  Musculoskeletal:  Negative for joint pain.  Neurological:  Negative for dizziness, focal weakness and weakness.  Psychiatric/Behavioral:  Negative for depression (Mood seems to be doing better) and memory loss. The patient is not nervous/anxious and does not have insomnia (Sleeping better.  Does get better sleep and uses Breathe Right strips).        Now doing better.    I have reviewed and (if needed) personally updated the patient's problem list, medications, allergies, past medical and surgical history, social and family history.   PAST MEDICAL HISTORY   Past Medical History:  Diagnosis Date   Carpal tunnel syndrome on left 12/2011   Chronic nasal  congestion    Depression 01/19/2012   GERD (gastroesophageal reflux disease)    Gout    History of Doppler ultrasound    a. Carotid US 3/11: no ICA stenosis    History of echocardiogram    a. Echo 6/14: mod LVH, EF 50-55%, inf-lat HK, Gr 1 DD, mild LAE   History of hiatal hernia    Hx of CABG 09/04/2019   LIMA-LAD, SVG-OM, SVG-Dx, and an SVG-RPLA.   Hyperlipidemia LDL goal < 70    Hypertension  under control; has been on med. since 1996   Multiple vessel coronary artery disease 1996, '02; '21   a. s/p Inf MI (age 70) >>PCI to RCA with tandem BMS (PS 1535); b. LHC 06/2000: pRCA 30-40%, prox/mid stents with 75% ISR, 30-40% at crux in RCA >> PCI: Cutting Balloon atherectomy for ISR; c. 2012 Myoview-nonischemic; d. 07/2019 -cath with progression of disease/LM disease => CABG x4   Myocardial infarct, old 1996   PCI   Sleep apnea    no testing yet   Ulcerative colitis ([place])     PAST SURGICAL HISTORY   Past Surgical History:  Procedure Laterality Date   CAROTID DOPPLERS  02/2020   R ICA 1-39%.  R CCA <50%.  LICA no evidence of stenosis.  Minimal plaque in extracranial vessels.  Normal bilateral vertebral arteries and subclavian arteries.   CARPAL TUNNEL RELEASE  01/26/2012   Procedure: CARPAL TUNNEL RELEASE;  Surgeon: Cammie Sickle., MD;  Location: [facility];  Service: Orthopedics;  Laterality: Left;   CORONARY ANGIOPLASTY  07/11/2000   Cutting Balloon PTCA for RCA ISR   CORONARY ARTERY BYPASS GRAFT N/A 09/04/2019   Procedure: CORONARY ARTERY BYPASS GRAFTING (CABG) using LIMA to LAD; Endoscopic Vein Harvest of Right Greater Saphenous: SVG to Diag1; SVG to OM2; SVG to PL.;  Surgeon: Gaye Pollack, MD;  Location: [facility];  Service: Open Heart Surgery;  Laterality: N/A;   CORONARY STENT PLACEMENT  1996   [id number] BMS x 2 in RCA   CYSTECTOMY  as a child   from neck   ENDOVEIN HARVEST OF GREATER SAPHENOUS VEIN Right 09/04/2019   Procedure: [place];  Surgeon: Gaye Pollack, MD;  Location: [facility];  Service: Open Heart Surgery;  Laterality: Right;  Endovein scope harvest of right upper and lower LE.   HERNIA REPAIR     LEFT HEART CATH AND CORONARY ANGIOGRAPHY N/A 08/22/2019   Procedure: LEFT HEART CATH AND CORONARY ANGIOGRAPHY;  Surgeon: Belva Crome, MD;  Location: [facility];;  Eccentric ostial 40 of 50% LM followed by 55-70% distal LM.  65% midLAD (heavily calcified), ostial D1 80%, LCx 70% between OM1 and OM2, RCA large with diffuse ISR prior PCI in 1996 and 2002-up to 90%.  PDA with competitive flow due to collateral flow from LCA.  EF 50-60%.  LVEDP 17    LEFT HEART CATH AND CORS/GRAFTS ANGIOGRAPHY N/A 07/07/2020   Procedure: LEFT HEART CATH AND CORS/GRAFTS ANGIOGRAPHY;  Surgeon: [name], MD;  Location: [facility];; New: 100% RCA CTO, 100% CTO p-m Cx after OM1; CTO SVG-D1 (? Culprit). Stable: Ost-pLM 50%, dLM 60%. Ost D1 80%, mid LAD 60% (pre LIMA) -> Patent LIMA-LAD. OM1 patent w/ diffuse mild Dz. Patent SVG-OM2, SVG--RPL1 (no Competitive native flow).  EF ~ 45% w/ Inflat HK. (Rec Echo)   NM MYOVIEW LTD  02/2011   Inferior infarct but no ischemia   NM MYOVIEW LTD  07/02/2020   EF roughly 40%.  Horizontal ST segment depression in anterolateral leads.  Also T wave inversions noted.  Medium size, partially reversible apical anterior and apical defect concerning for prior infarct with peri-infarct ischemia.  Intermediate risk.  Global HK with septal HK from prior sternotomy.   TEE WITHOUT CARDIOVERSION N/A 09/04/2019   Procedure: INTRAOPERATIVE TRANSESOPHAGEAL ECHOCARDIOGRAM (TEE);  Surgeon: Gaye Pollack, MD;  Location: [facility];  Service: Open Heart Surgery;  Intra-Op TEE: EF 50-55%.  Mild MR normal aortic, pulmonic and tricuspid valves. ->  Stable postop   TONSILLECTOMY  as a child   TRANSTHORACIC ECHOCARDIOGRAM  07/16/2020   EF 55 to 60%.  Paradoxical septal motion c/w Post-OP Sternotomy.  GR 1 DD.   Normal valves.  Normal RV. No change from 09/13/2017   ZIO PATCH EVENT MONITOR  03/2020   Rare PACs and PVCs.  No arrhythmias.  Mostly sinus rhythm rate ranged from 58 to 146 bpm.  Average 81 bpm.   Cardiac Cath 07/07/2020: New finding was CTO mid RCA, mid LCx after OM1 as well as SVG-D1 (likely culprit lesion) Severe native vessel disease:  100% % occluded RCA (new), 100% proximal-mid LCx after OM1, stable ostial/proximal LM 50% and distal LM 60 to 65%; ostial D1 likely 70% stenosis, mid LAD 65% stenosis. 3/4 grafts patent Patent LIMA-LAD with competitive flow Patent SVG-OM2 without competitive flow Patent SVG-RPL without competitive flow. Occluded SVG-Diagonal -> new, likely culprit lesion. ->  Clearly This would likely require atherectomy and PCI of LM-LAD and ostial D1 which would jeopardize LIMA-LAD. EF appears to be more like 45 to 50% with likely inferolateral hypokinesis.   Immunization History  Administered Date(s) Administered   Influenza,inj,Quad PF,6+ Mos 01/17/2013, 04/07/2015, 03/06/2017   Pneumococcal Polysaccharide-23 01/17/2013    MEDICATIONS/ALLERGIES   Current Outpatient Medications  Medication Instructions   acetaminophen (TYLENOL) 1,000 mg, Oral, Every 6 hours PRN   allopurinol (ZYLOPRIM) 300 mg, Oral, Daily   amLODipine (NORVASC) 2.5 mg, Oral, Daily   anastrozole (ARIMIDEX) 0.5 mg, Oral, Weekly   clopidogrel (PLAVIX) 75 mg, Oral, Daily   esomeprazole (NEXIUM) 40 mg, Oral, 2 times daily   ezetimibe (ZETIA) 10 MG tablet TAKE 1 TABLET (10 MG TOTAL) BY MOUTH DAILY.   mesalamine (LIALDA) 1.2 g, Oral, Daily with breakfast, Take 1.2g daily, May take an additional 3 tablets as needed (total of 4 Tablets daily)   metoprolol tartrate (LOPRESSOR) 50 mg, Oral, 2 times daily   nitroGLYCERIN (NITROSTAT) 0.4 mg, Sublingual, Every 5 min PRN   ranolazine (RANEXA) 500 mg, Oral, 2 times daily   rosuvastatin (CRESTOR) 40 mg, Oral, Daily   sildenafil (VIAGRA) 50 MG tablet Take  0.5-2 tablets (25-100 mg total) by mouth as needed for erectile dysfunction   Vilazodone HCl (VIIBRYD) 40 MG TABS Take 1 tablet (40 mg total) by mouth daily with food   Vitamin D 5,000 Units, Oral, Daily    Current Meds Ordered Today  Medication Sig   sildenafil (VIAGRA) 50 MG tablet Take 0.5-2 tablets (25-100 mg total) by mouth as needed for erectile dysfunction    Allergies  Allergen Reactions   Pantoprazole     GI upset    SOCIAL HISTORY/FAMILY HISTORY   Reviewed in Epic:  Pertinent findings:  Social History   Tobacco Use   Smoking status: Former   Smokeless tobacco: Current    Types: Snuff, Chew  Vaping Use   Vaping Use: Never used  Substance Use Topics   Alcohol use: No   Drug use: No   Social History   Social History Narrative   He lives in [place], [place] near [place] with his wife. [place].  Near the former [address] takes houses his horses.   At baseline, he is extremely active around the farm, but does not have a routine exercise regimen.   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   He quit smoking in 1994.    OBJCTIVE -PE, EKG,  labs   Wt Readings from Last 3 Encounters:  04/20/21 218 lb 9.6 oz (99.2 kg)  10/21/20 216 lb 9.6 oz (98.2 kg)  07/22/20 220 lb 6.4 oz (100 kg)    Physical Exam: BP 124/72    Pulse 75    Ht 5' 11"  (1.803 m)    Wt 218 lb 9.6 oz (99.2 kg)    SpO2 97%    BMI 30.49 kg/m  Physical Exam Constitutional:      Appearance: Normal appearance. He is obese.  HENT:     Head: Normocephalic and atraumatic.  Neck:     Vascular: No carotid bruit.  Cardiovascular:     Rate and Rhythm: Normal rate and regular rhythm. No extrasystoles are present.    Pulses: Normal pulses and intact distal pulses.     Heart sounds: S1 normal and S2 normal. Heart sounds are distant. No murmur heard.   No friction rub. No gallop.  Pulmonary:     Effort: Pulmonary effort is normal. No respiratory distress.     Breath sounds: Normal  breath sounds. No wheezing, rhonchi or rales.  Musculoskeletal:        General: No swelling. Normal range of motion.     Cervical back: Normal range of motion and neck supple.  Skin:    General: Skin is warm and dry.  Neurological:     General: No focal deficit present.     Mental Status: He is alert and oriented to person, place, and time.  Psychiatric:        Mood and Affect: Mood normal.        Behavior: Behavior normal.        Thought Content: Thought content normal.        Judgment: Judgment normal.     Comments: Notably more upbeat mood    Adult ECG Report  Rate: 75 ;  Rhythm: normal sinus rhythm; left atrial enlargement.Otherwise normal axis, intervals and durations.  Narrative Interpretation: Stable EKG.  Recent Labs: Reviewed-Excellent lipids; PCP to follow-up A1c in March. Lab Results  Component Value Date   CHOL 94 (L) 03/24/2021   HDL 27 (L) 03/24/2021   LDLCALC 49 03/24/2021   TRIG 87 03/24/2021   CHOLHDL 3.5 03/24/2021   Lab Results  Component Value Date   CREATININE 1.28 (H) 03/24/2021   BUN 18 03/24/2021   NA 143 03/24/2021   K 4.6 03/24/2021   CL 104 03/24/2021   CO2 23 03/24/2021   CBC Latest Ref Rng & Units 07/02/2020 03/23/2020 09/09/2019  WBC 3.4 - 10.8 x10E3/uL 9.0 8.9 10.3  Hemoglobin 13.0 - 17.7 g/dL 14.8 14.3 12.7(L)  Hematocrit 37.5 - 51.0 % 46.8 46.3 38.6(L)  Platelets 150 - 450 x10E3/uL 254 306 238    Lab Results  Component Value Date   HGBA1C 6.1 (H) 09/02/2019   Lab Results  Component Value Date   TSH 2.652 06/14/2016    ==================================================  COVID-19 Education: The signs and symptoms of COVID-19 were discussed with the patient and how to seek care for testing (follow up with PCP or arrange E-visit).    I spent a total of 31 minutes with the patient spent in direct patient consultation.  Additional time spent with chart review  / charting (studies, outside notes, etc): 25 min Total Time: 56   min  Current medicines are reviewed at length with the patient today.  (+/- concerns) asked questions about erectile dysfunction  This visit occurred during the SARS-CoV-2 public health emergency.  Safety protocols were in place, including screening questions prior to the visit, additional usage of staff PPE, and extensive cleaning of exam room while observing appropriate contact time as indicated for disinfecting solutions.  Notice: This dictation was prepared with Dragon dictation along with smart phrase technology. Any transcriptional errors that result from this process are unintentional and may not be corrected upon review.  Studies Ordered:   Orders Placed This Encounter  Procedures   EKG 12-Lead    Patient Instructions / Medication Changes & Studies & Tests Ordered   Patient Instructions  Medication Instructions:   Sildenafil 50 mg   start of with 25 mg   then try 50 mg tablet , you may use up to 100 mg if needed. If you use medication do not take your Amlodipine that day .  Do not use Sildenafil  if you take NTG tablet in 24 hours before or after use.  *If you need a refill on your cardiac medications before your next appointment, please call your pharmacy*   Lab Work:  Not needed   Testing/Procedures:  Not needed  Follow-Up: At [facility], you and your health needs are our priority.  As part of our continuing mission to provide you with exceptional heart care, we have created designated Provider Care Teams.  These Care Teams include your primary Cardiologist (physician) and Advanced Practice Providers (APPs -  Physician Assistants and Nurse Practitioners) who all work together to provide you with the care you need, when you need it.     Your next appointment:   12 month(s)  The format for your next appointment:   In Person  Provider:   Glenetta Hew, MD        Glenetta Hew, M.D., M.S. Interventional Cardiologist   Pager # [phone] Phone #  [phone] [address]. [place], [place] [address]   Thank you for choosing Heartcare at [facility]!!

## 2021-04-20 NOTE — Assessment & Plan Note (Signed)
We checked lipids back in the end of December and his LDL was 49.  This indicates significant improvement after initiating Zetia.  Plan: Continue rosuvastatin 40 mg daily along with Zetia 10 mg daily. ->  We will probably recheck lipids if not in June 2023, in the end of the year/December

## 2021-04-20 NOTE — Assessment & Plan Note (Signed)
The polysomnogram was read as low level OSA.  Recommending low risk management with dental DM, Breathe Right strips.  He said he slept much better having used Breathe Right strips -> leads to better energy the next day.

## 2021-04-20 NOTE — Patient Instructions (Addendum)
Medication Instructions:   Sildenafil 50 mg   start of with 25 mg   then try 50 mg tablet , you may use up to 100 mg if needed. If you use medication do not take your Amlodipine that day .  Do not use Sildenafil  if you take NTG tablet in 24 hours before or after use.  *If you need a refill on your cardiac medications before your next appointment, please call your pharmacy*   Lab Work:  Not needed   Testing/Procedures:  Not needed  Follow-Up: At [facility], you and your health needs are our priority.  As part of our continuing mission to provide you with exceptional heart care, we have created designated Provider Care Teams.  These Care Teams include your primary Cardiologist (physician) and Advanced Practice Providers (APPs -  Physician Assistants and Nurse Practitioners) who all work together to provide you with the care you need, when you need it.     Your next appointment:   12 month(s)  The format for your next appointment:   In Person  Provider:   Glenetta Hew, MD

## 2021-04-20 NOTE — Assessment & Plan Note (Signed)
·   Okay to hold Plavix 5 to 7 days preop for surgery or procedures.

## 2021-04-20 NOTE — Assessment & Plan Note (Signed)
Chronic stable angina-not unexpected with occluded RCA and SVG-diagonal.: Well-controlled on combination of amlodipine and Ranexa along with Lopressor.  Other than some exertional dyspnea he is doing very well.  Plan:   Continue current low-dose amlodipine 2.5 mg daily along with moderate dose Lopressor 50 mg twice daily and Ranexa 500 mg daily  Continue adequate lipid control with current combination of Zetia 10 mg and rosuvastatin 40 mg daily.  On long-term Plavix monotherapy  Okay to hold Plavix 5 days preop for surgery or procedures.  (7 days for more high risk procedure).

## 2021-04-20 NOTE — Assessment & Plan Note (Signed)
As a last minute discussing point, he brought up concerns about ED --> likely related to vascular insufficiency but also antihypertensive agents.  -> Options include holding amlodipine on a as needed basis, but will also provide Viagra (sildenafil) 50 mg tablets -> we will start off with 25 mg tablets, but if inadequate, increase to 50 mg.  Can take as many as 2 tablets daily as needed.  Must hold nitroglycerin for at least 24 hours pre or post use of sildenafil. Also recommend holding amlodipine if using sildenafil.

## 2021-04-20 NOTE — Assessment & Plan Note (Signed)
No notable benefit from statin holiday.

## 2021-04-20 NOTE — Assessment & Plan Note (Signed)
Doing much better having stopped ACE inhibitor/ARB which is also last use of low-dose of amlodipine.  Ensure adequate hydration, and and avoid rapid standing movement.

## 2021-04-20 NOTE — Assessment & Plan Note (Addendum)
Eccentric wall defect noted on prior Myoview.  This is consistent with occluded SVG-diagonal.  Also EF somewhat reduced by postop septal bounce.  EF still preserved at 55 to 60% with paradoxical septal motion.  No active CHF symptoms

## 2021-04-20 NOTE — Assessment & Plan Note (Signed)
Jeopardized first diagonal with significant left main and ostial LAD disease, and now occluded SVG.  Clearly there is some area could be to some angina.  Unfortunately, intervening to restore flow to the diagonal would require atherectomy of the left main as well as ostial diagonal.  In doing this, I want to likely jeopardize TID.  Plan: Continue current doses of beta-blocker, calcium channel blocker and Ranexa for antianginal benefit. On combination rosuvastatin and Zetia for lipid control along with monotherapy Plavix.

## 2021-04-20 NOTE — Assessment & Plan Note (Signed)
Well-controlled with current dose of Lopressor 50 mg twice daily

## 2021-04-20 NOTE — Assessment & Plan Note (Signed)
Well-controlled blood pressure on current dose of blood pressure 50 mg twice daily and amlodipine 2.5 mg daily.  Can be switched over to amlodipine from ARB to allow for antianginal benefit.

## 2021-04-25 ENCOUNTER — Other Ambulatory Visit (HOSPITAL_COMMUNITY): Payer: Self-pay

## 2021-05-05 ENCOUNTER — Telehealth: Payer: Self-pay | Admitting: Cardiology

## 2021-05-05 NOTE — Telephone Encounter (Addendum)
Spoke with wife who reports that over the past month, patient's heart is pounding. BP from 136/76 to 159/91. P WNL. Three days ago, he completed a 6-day prednisone regimen and wants to know if the medication has caused the palpitations. He also reports a little sob. Recommended that if pt has chest pin/pressure/tightness to take ntg as prescribed and go to ED if the ntg doesn't work. Wife and patient voiced understanding of this conversation. He takes medications as prescribed. Please advise on palpitations.

## 2021-05-05 NOTE — Telephone Encounter (Signed)
It is possible that prednisone can trigger palpitations but also the reason for using the patch prednisone is also something to trigger palpitations.  If they are bothersome, he can take an extra dose of metoprolol in the middle of the day to calm it down if necessary.  Glenetta Hew, MD

## 2021-05-05 NOTE — Telephone Encounter (Signed)
° °  STAT if HR is under 50 or over 120 (normal HR is 60-100 beats per minute)  What is your heart rate? 143/80 HR 80      Last night : 136/76 HR 99  Do you have a log of your heart rate readings (document readings)?   Do you have any other symptoms? Pt's wife said, pt been feeling heart pounding especially when he lays down, she said pt doesn't have CP and SOB. She also said, pt took 6 days of prednisone and he took his last pill last Sunday and wondering if that causing his elevated HR and BP

## 2021-05-06 NOTE — Telephone Encounter (Signed)
Spoke with wife and explained about patient taking an extra dose of metoprolol during the day if palpitations are bothersome. She stated hs is having less of them today.

## 2021-05-07 NOTE — Telephone Encounter (Signed)
Good.  I hope that as the prednisone wears off, palpitations improved.

## 2021-05-22 ENCOUNTER — Other Ambulatory Visit: Payer: Self-pay | Admitting: Cardiology

## 2021-05-23 ENCOUNTER — Other Ambulatory Visit (HOSPITAL_COMMUNITY): Payer: Self-pay

## 2021-05-23 MED ORDER — CLOPIDOGREL BISULFATE 75 MG PO TABS
75.0000 mg | ORAL_TABLET | Freq: Every day | ORAL | 3 refills | Status: DC
  Filled 2021-05-23: qty 90, 90d supply, fill #0
  Filled 2021-08-19: qty 90, 90d supply, fill #1

## 2021-05-25 ENCOUNTER — Other Ambulatory Visit (HOSPITAL_COMMUNITY): Payer: Self-pay

## 2021-05-25 MED ORDER — ALLOPURINOL 300 MG PO TABS
300.0000 mg | ORAL_TABLET | Freq: Every day | ORAL | 0 refills | Status: DC
  Filled 2021-05-25: qty 90, 90d supply, fill #0

## 2021-05-30 ENCOUNTER — Other Ambulatory Visit (HOSPITAL_COMMUNITY): Payer: Self-pay

## 2021-05-31 ENCOUNTER — Other Ambulatory Visit (HOSPITAL_COMMUNITY): Payer: Self-pay

## 2021-06-01 ENCOUNTER — Other Ambulatory Visit (HOSPITAL_COMMUNITY): Payer: Self-pay

## 2021-06-02 ENCOUNTER — Other Ambulatory Visit (HOSPITAL_COMMUNITY): Payer: Self-pay

## 2021-06-03 ENCOUNTER — Other Ambulatory Visit (HOSPITAL_COMMUNITY): Payer: Self-pay

## 2021-06-03 MED ORDER — ANASTROZOLE 1 MG PO TABS
0.5000 mg | ORAL_TABLET | ORAL | 1 refills | Status: DC
  Filled 2021-06-03 – 2021-06-08 (×2): qty 6, 84d supply, fill #0

## 2021-06-03 MED ORDER — ESOMEPRAZOLE MAGNESIUM 40 MG PO CPDR
40.0000 mg | DELAYED_RELEASE_CAPSULE | Freq: Every day | ORAL | 0 refills | Status: DC
  Filled 2021-06-03: qty 90, 90d supply, fill #0

## 2021-06-06 ENCOUNTER — Other Ambulatory Visit (HOSPITAL_COMMUNITY): Payer: Self-pay

## 2021-06-08 ENCOUNTER — Other Ambulatory Visit (HOSPITAL_COMMUNITY): Payer: Self-pay

## 2021-06-13 ENCOUNTER — Other Ambulatory Visit: Payer: Self-pay | Admitting: Cardiology

## 2021-06-14 ENCOUNTER — Other Ambulatory Visit (HOSPITAL_COMMUNITY): Payer: Self-pay

## 2021-06-14 MED ORDER — AMLODIPINE BESYLATE 2.5 MG PO TABS
2.5000 mg | ORAL_TABLET | Freq: Every day | ORAL | 3 refills | Status: DC
  Filled 2021-06-14: qty 90, 90d supply, fill #0
  Filled 2021-09-14: qty 90, 90d supply, fill #1

## 2021-06-20 ENCOUNTER — Other Ambulatory Visit (HOSPITAL_COMMUNITY): Payer: Self-pay

## 2021-06-20 DIAGNOSIS — E7800 Pure hypercholesterolemia, unspecified: Secondary | ICD-10-CM | POA: Diagnosis not present

## 2021-06-20 DIAGNOSIS — R7301 Impaired fasting glucose: Secondary | ICD-10-CM | POA: Diagnosis not present

## 2021-06-20 DIAGNOSIS — K5190 Ulcerative colitis, unspecified, without complications: Secondary | ICD-10-CM | POA: Diagnosis not present

## 2021-06-20 DIAGNOSIS — I130 Hypertensive heart and chronic kidney disease with heart failure and stage 1 through stage 4 chronic kidney disease, or unspecified chronic kidney disease: Secondary | ICD-10-CM | POA: Diagnosis not present

## 2021-06-20 DIAGNOSIS — F411 Generalized anxiety disorder: Secondary | ICD-10-CM | POA: Diagnosis not present

## 2021-06-20 DIAGNOSIS — I2510 Atherosclerotic heart disease of native coronary artery without angina pectoris: Secondary | ICD-10-CM | POA: Diagnosis not present

## 2021-06-20 DIAGNOSIS — M109 Gout, unspecified: Secondary | ICD-10-CM | POA: Diagnosis not present

## 2021-06-20 DIAGNOSIS — Z0000 Encounter for general adult medical examination without abnormal findings: Secondary | ICD-10-CM | POA: Diagnosis not present

## 2021-06-20 DIAGNOSIS — K219 Gastro-esophageal reflux disease without esophagitis: Secondary | ICD-10-CM | POA: Diagnosis not present

## 2021-06-20 MED ORDER — TADALAFIL 5 MG PO TABS
5.0000 mg | ORAL_TABLET | Freq: Every day | ORAL | 3 refills | Status: DC | PRN
  Filled 2021-06-20: qty 90, 90d supply, fill #0

## 2021-06-22 IMAGING — CT CT ORBITS WO/W CM
2 of 20 series · 12 of 47 positions shown, 15 images · IV contrast (APPLIED)
Comparison: None.

CLINICAL DATA: Giant cell arteritis. Retinal vasculitis of left
eye.

EXAM:
CT HEAD AND ORBITS WITHOUT AND WITH CONTRAST
TECHNIQUE: Contiguous axial images were obtained from the base of the skull
through the vertex with and without contrast. Multidetector CT
imaging of the orbits was performed using the standard protocol with
and without intravenous contrast.
CONTRAST:  100mL OMNIPAQUE IOHEXOL 350 MG/ML SOLN

[Series 19: ax thin · axial · 0.39mm/px · z∈[+1157,+1473]mm · 10 of 388 slices shown, 13 images]
[im 36/388  brain]
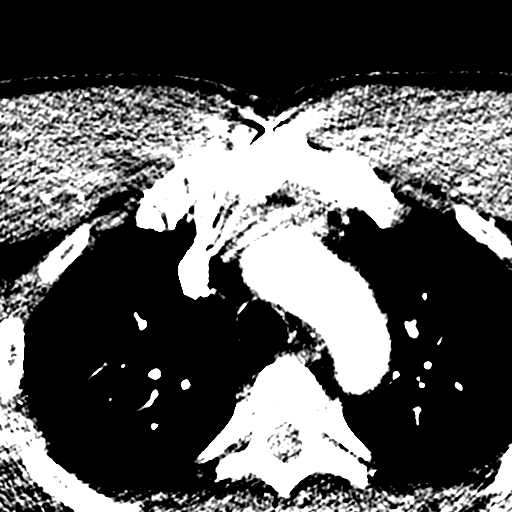
[im 36/388  bone]
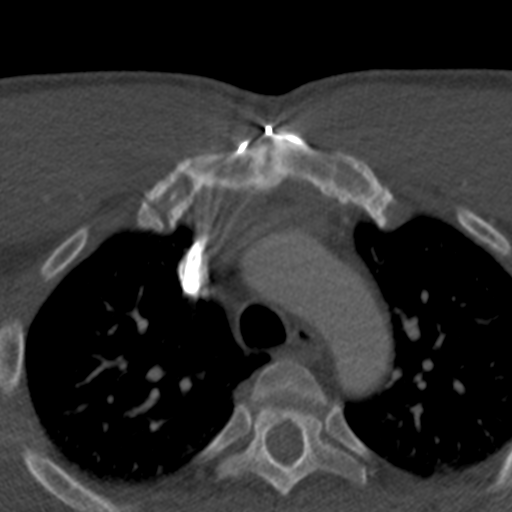
[im 71/388  bone]
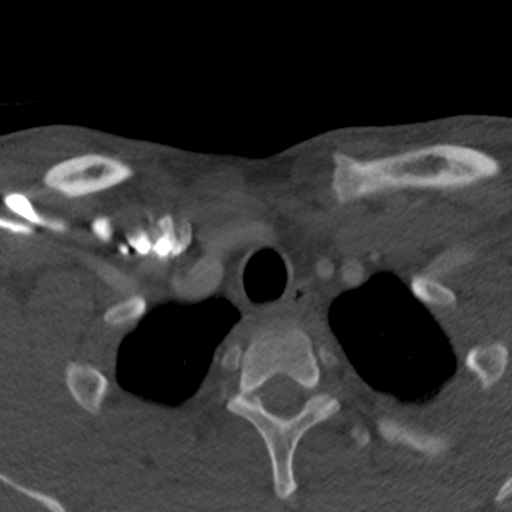
[im 106/388  bone]
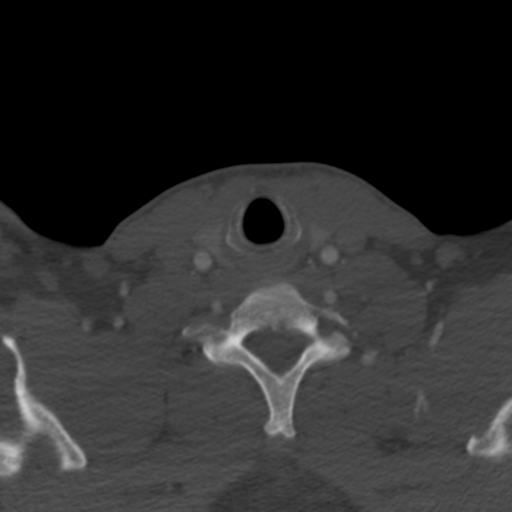
[im 141/388  bone]
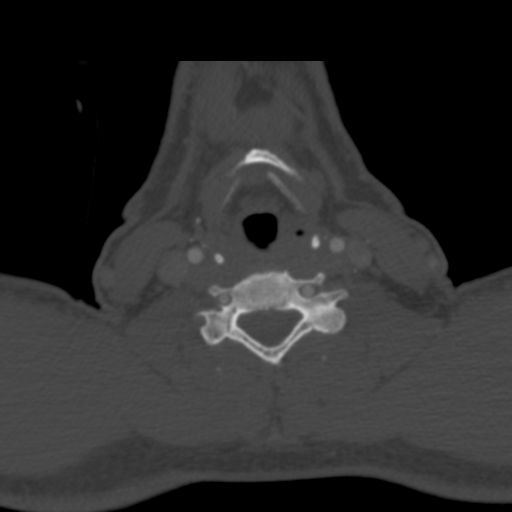
[im 176/388  brain]
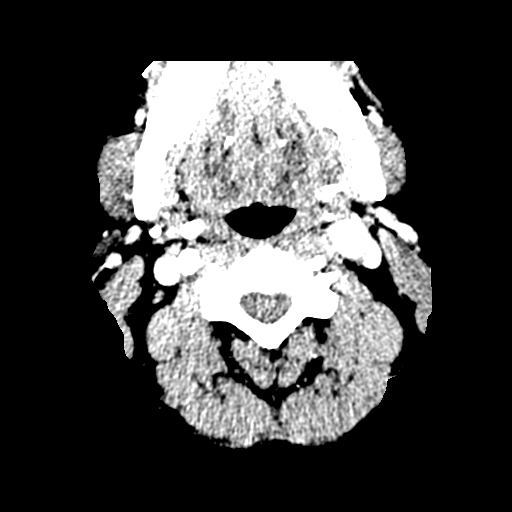
[im 176/388  bone]
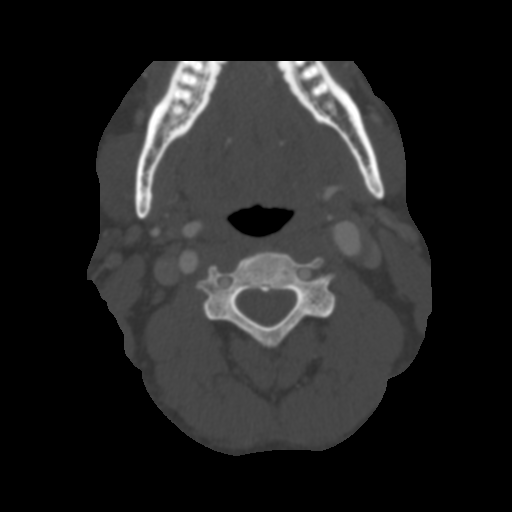
[im 212/388  bone]
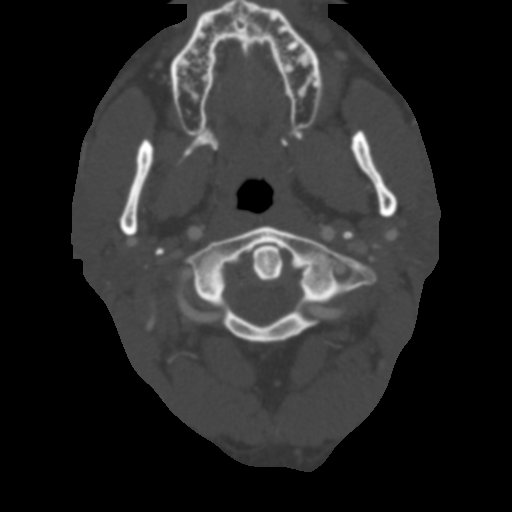
[im 247/388  bone]
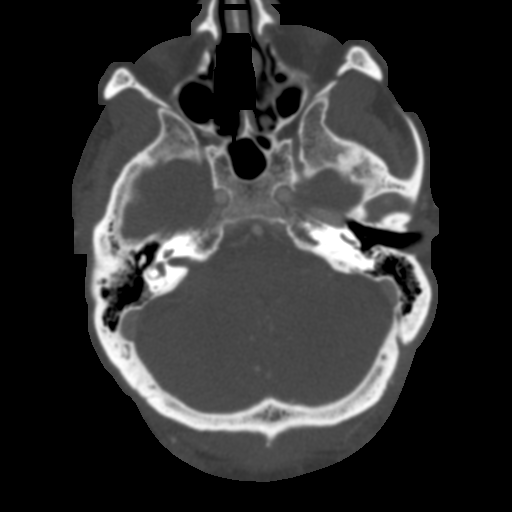
[im 282/388  bone]
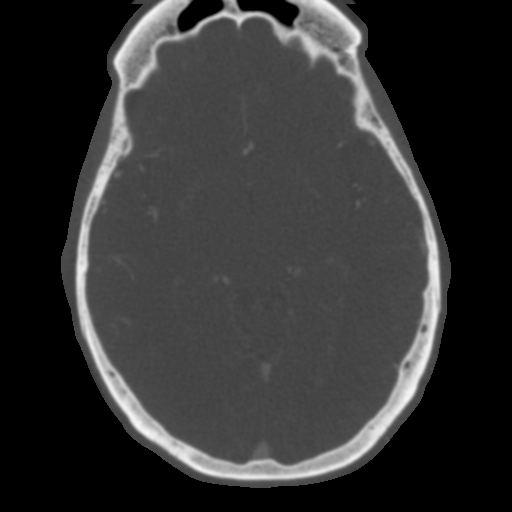
[im 317/388  brain]
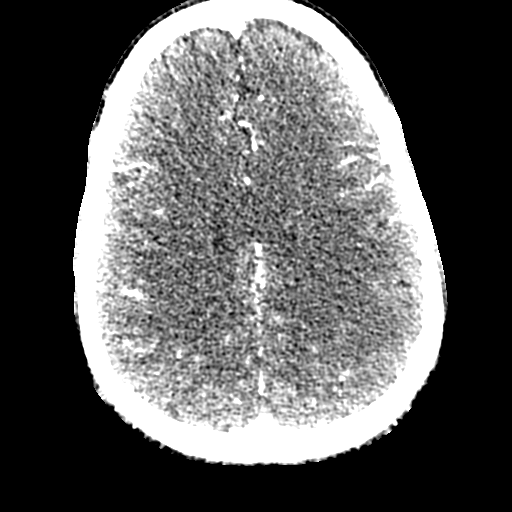
[im 317/388  bone]
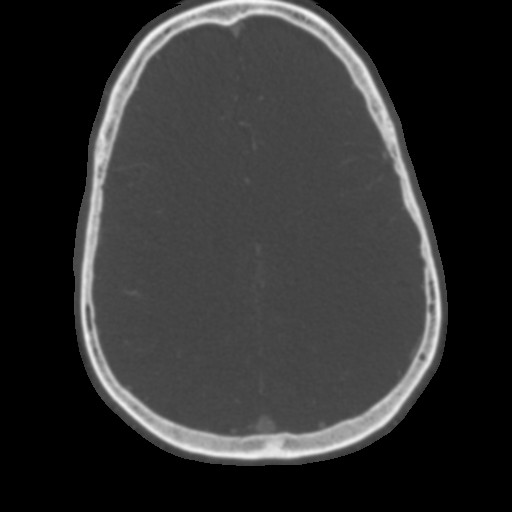
[im 352/388  bone]
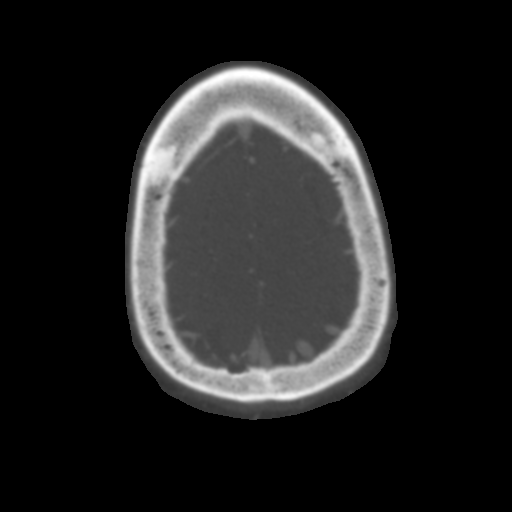

[Series 20: coronal thin · coronal · 0.59mm/px · 2 of 241 slices shown]
[im 85/241  bone]
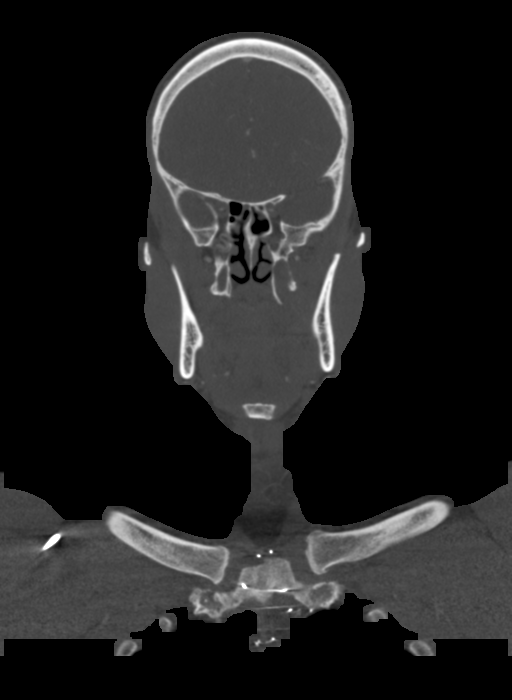
[im 170/241  bone]
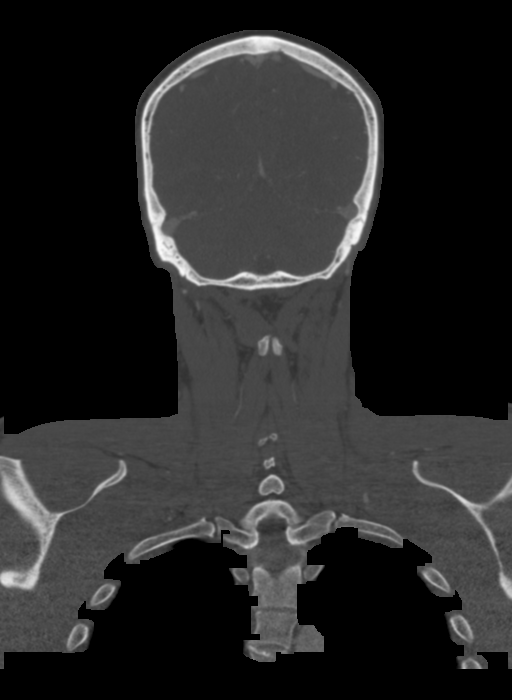

[12 of 47 positions shown; findings below may reference images not displayed]

FINDINGS: CT HEAD FINDINGS

Brain: No evidence of acute infarction, hemorrhage, hydrocephalus,
extra-axial collection or mass lesion/mass effect.

Vascular: No hyperdense vessel or unexpected calcification.

Skull: Normal. Negative for fracture or focal lesion.

CT ORBITS FINDINGS

Orbits: No traumatic or inflammatory finding. Globes, optic nerves,
orbital fat, extraocular muscles, vascular structures, and lacrimal
glands are normal.

Visualized sinuses: Trace mucosal thickening of the bilateral
maxillary sinuses.

Soft tissues: Negative.
IMPRESSION: 1. No acute intracranial findings.
2. No traumatic or inflammatory finding of the orbits.
3. Trace mucosal thickening of the bilateral maxillary sinuses.

## 2021-07-05 ENCOUNTER — Other Ambulatory Visit: Payer: Self-pay | Admitting: Cardiology

## 2021-07-05 ENCOUNTER — Other Ambulatory Visit (HOSPITAL_COMMUNITY): Payer: Self-pay

## 2021-07-05 MED ORDER — NITROGLYCERIN 0.4 MG SL SUBL
0.4000 mg | SUBLINGUAL_TABLET | SUBLINGUAL | 6 refills | Status: AC | PRN
  Filled 2021-07-05: qty 25, 1d supply, fill #0
  Filled 2021-09-20: qty 25, 7d supply, fill #0
  Filled 2021-09-22: qty 25, 30d supply, fill #0
  Filled 2022-01-08 – 2022-01-23 (×2): qty 25, 30d supply, fill #1

## 2021-07-07 ENCOUNTER — Encounter: Payer: Self-pay | Admitting: Cardiology

## 2021-07-09 ENCOUNTER — Other Ambulatory Visit: Payer: Self-pay | Admitting: Cardiology

## 2021-07-09 NOTE — Telephone Encounter (Signed)
Hard to know what to make of his symptoms. ?Sounds like we may need to have him wear a monitor => would probably start with a 7-day monitor, but I would need to actually see him to know what is actually going on. ? ?I would like to do another nuclear stress test without being sure, because the previous one last year led to a cardiac catheterization. ? ?For now lets increase his metoprolol dose to 75 mg twice daily (1-1/2 tablets twice daily) ? ?Glenetta Hew, MD ? ?

## 2021-07-11 ENCOUNTER — Other Ambulatory Visit: Payer: Self-pay | Admitting: Cardiology

## 2021-07-11 ENCOUNTER — Other Ambulatory Visit (HOSPITAL_COMMUNITY): Payer: Self-pay

## 2021-07-11 MED ORDER — EZETIMIBE 10 MG PO TABS
10.0000 mg | ORAL_TABLET | Freq: Every day | ORAL | 3 refills | Status: DC
  Filled 2021-07-11: qty 90, 90d supply, fill #0
  Filled 2021-10-09: qty 90, 90d supply, fill #1
  Filled 2022-01-08 – 2022-01-20 (×2): qty 90, 90d supply, fill #2
  Filled 2022-04-24: qty 90, 90d supply, fill #3

## 2021-07-11 MED ORDER — RANOLAZINE ER 500 MG PO TB12
500.0000 mg | ORAL_TABLET | Freq: Two times a day (BID) | ORAL | 3 refills | Status: DC
  Filled 2021-07-11 – 2021-07-28 (×2): qty 180, 90d supply, fill #0
  Filled 2021-10-30: qty 180, 90d supply, fill #1
  Filled 2022-01-23: qty 180, 90d supply, fill #2
  Filled 2022-04-24: qty 180, 90d supply, fill #3

## 2021-07-12 ENCOUNTER — Ambulatory Visit: Payer: 59 | Admitting: Cardiology

## 2021-07-12 ENCOUNTER — Other Ambulatory Visit (HOSPITAL_COMMUNITY): Payer: Self-pay

## 2021-07-13 ENCOUNTER — Other Ambulatory Visit (HOSPITAL_COMMUNITY): Payer: Self-pay

## 2021-07-13 DIAGNOSIS — K5190 Ulcerative colitis, unspecified, without complications: Secondary | ICD-10-CM | POA: Diagnosis not present

## 2021-07-13 DIAGNOSIS — R1310 Dysphagia, unspecified: Secondary | ICD-10-CM | POA: Diagnosis not present

## 2021-07-13 MED ORDER — ESOMEPRAZOLE MAGNESIUM 40 MG PO CPDR
40.0000 mg | DELAYED_RELEASE_CAPSULE | Freq: Two times a day (BID) | ORAL | 1 refills | Status: DC
  Filled 2021-07-13 (×2): qty 60, 30d supply, fill #0

## 2021-07-15 ENCOUNTER — Other Ambulatory Visit (HOSPITAL_COMMUNITY): Payer: Self-pay

## 2021-07-15 MED ORDER — ESOMEPRAZOLE MAGNESIUM 40 MG PO CPDR: 40.0000 mg | DELAYED_RELEASE_CAPSULE | Freq: Every day | ORAL | 3 refills | Status: DC

## 2021-07-18 ENCOUNTER — Other Ambulatory Visit (HOSPITAL_COMMUNITY): Payer: Self-pay

## 2021-07-18 MED ORDER — ESOMEPRAZOLE MAGNESIUM 40 MG PO CPDR
40.0000 mg | DELAYED_RELEASE_CAPSULE | Freq: Two times a day (BID) | ORAL | 6 refills | Status: DC
  Filled 2021-07-18: qty 60, 30d supply, fill #0

## 2021-07-19 ENCOUNTER — Encounter: Payer: Self-pay | Admitting: Cardiology

## 2021-07-19 ENCOUNTER — Ambulatory Visit: Payer: 59 | Admitting: Cardiology

## 2021-07-19 ENCOUNTER — Telehealth: Payer: Self-pay | Admitting: *Deleted

## 2021-07-19 ENCOUNTER — Other Ambulatory Visit (HOSPITAL_COMMUNITY): Payer: Self-pay

## 2021-07-19 VITALS — BP 122/82 | HR 67 | Ht 70.0 in | Wt 225.2 lb

## 2021-07-19 DIAGNOSIS — R079 Chest pain, unspecified: Secondary | ICD-10-CM | POA: Diagnosis not present

## 2021-07-19 DIAGNOSIS — I208 Other forms of angina pectoris: Secondary | ICD-10-CM | POA: Diagnosis not present

## 2021-07-19 DIAGNOSIS — I10 Essential (primary) hypertension: Secondary | ICD-10-CM

## 2021-07-19 DIAGNOSIS — R002 Palpitations: Secondary | ICD-10-CM | POA: Diagnosis not present

## 2021-07-19 DIAGNOSIS — I25119 Atherosclerotic heart disease of native coronary artery with unspecified angina pectoris: Secondary | ICD-10-CM | POA: Diagnosis not present

## 2021-07-19 DIAGNOSIS — I252 Old myocardial infarction: Secondary | ICD-10-CM

## 2021-07-19 DIAGNOSIS — Z9861 Coronary angioplasty status: Secondary | ICD-10-CM | POA: Diagnosis not present

## 2021-07-19 DIAGNOSIS — I25709 Atherosclerosis of coronary artery bypass graft(s), unspecified, with unspecified angina pectoris: Secondary | ICD-10-CM

## 2021-07-19 DIAGNOSIS — I2510 Atherosclerotic heart disease of native coronary artery without angina pectoris: Secondary | ICD-10-CM

## 2021-07-19 MED ORDER — DEXLANSOPRAZOLE 60 MG PO CPDR
60.0000 mg | DELAYED_RELEASE_CAPSULE | Freq: Every day | ORAL | 11 refills | Status: DC
  Filled 2021-07-19: qty 30, 30d supply, fill #0

## 2021-07-19 NOTE — Progress Notes (Signed)
? ?Primary Care Provider: [name], MD ?Cardiologist: Glenetta Hew, MD ?Electrophysiologist: None ? ?Clinic Note: ?Chief Complaint  ?Patient presents with  ? Coronary Artery Disease  ?  Concern for choking discomfort and throat 2 weeks ago.  ? Palpitations  ?  Off-and-on skipping beats short bursts of fast beats  ? ?=================================== ? ?ASSESSMENT/PLAN  ? ?Problem List Items Addressed This Visit   ? ?  ? Cardiology Problems  ? Coronary artery disease involving native coronary artery of native heart with angina pectoris (HCC) (Chronic)  ?  I do not know if his symptoms are anginal in nature, seems like is probably more related to GERD.  However could be spasm.  I would like to try to adjust the medications before going to order another stress test which was somewhat confusing last year. ? ?Plan: ?We will continue current dose of Lopressor-but with palpitations he can take an extra 1/2-1 full tablet as needed. ?Continue Ranexa 500 mg twice daily ?Continue amlodipine at 2.5 mg, but change dosing time to lunchtime since his symptoms are mostly in the evening. ?Current dose of rosuvastatin and Zetia.  Lipids have been controlled. ?Continue Plavix maintenance.  ?He has been taking Nexium which has a adverse effect on the effectiveness of Plavix, will switch from Nexium to [place] ?  ?  ? Coronary artery disease involving coronary bypass graft of native heart with angina pectoris (HCC) (Chronic)  ?  Small first diagonal branch actually jeopardized with the SVG graft being down.  Unfortunately PCI to restore flow to the diagonal branch would likely jeopardize LIMA.  As such, would prefer to avoid that maneuver. ? ?  ?  ? Essential hypertension, benign (Chronic)  ?  Blood pressure is well controlled.  Since he is having these weird fluttering sensations in the evening I wonder if there is sometimes a spasm component (not associated with exertion), perhaps switching the timing of taking is What  Will Help. ? ?Plan: Switch amlodipine to months time. ? ? ?  ?  ? Myocardial infarct, old (Chronic)  ?  Fixed defect seen on Myoview.  Consistent with occluded SVG to diagonal.  Relatively preserved EF however septal bounce.  Difficult to fully assess on echo. ? ?  ?  ? CAD S/P percutaneous coronary angioplasty (Chronic)  ?  He is on Plavix maintenance dose of 75 mg daily. ?Okay to hold Plavix 5 to 7 days preop for surgical procedures. ?Convert PPI from Nexium to Dexilant to avoid interaction with Plavix ?  ?  ? Atypical angina (HCC) (Chronic)  ?  He has had interesting symptoms of angina I am not sure that the current symptoms he is having is angina.  It was nonexertional in nature if anything, it could be spasm.  As such for switching the dosing time of his amlodipine. ? ?  ?  ?  ? Other  ? Heart palpitations - Primary (Chronic)  ?  Pretty much has been well controlled up until this time around.  He is taking 50 mg metoprolol twice daily.  Not happening every day and his resting heart rate is not that high.  I would prefer to avoid further titrating all the way up, but would allow him to take additional 1/2 to 1 tablet a day as needed for breakthrough spells. ? ?  ?  ? Relevant Orders  ? EKG 12-Lead (Completed)  ? ?Other Visit Diagnoses   ? ? Chest pain with moderate risk for cardiac etiology      ?  Relevant Orders  ? EKG 12-Lead (Completed)  ? ?  ? ? ?=================================== ? ?HPI:   ? ?Daniel Buck is a 56 y.o. male with a PMH below who presents today for 3 months review with complaints of unusual choking symptom in his upper chest & increased palpitations.Daniel Buck ?Daniel Buck follows up today at the request of [name], MD. ? ?Cardiac History ?1996: Inferior STEMI-BMS PCI RCA ?2002: Recurrent angina-atherectomy and PCI for ISR ?08/22/2019: Recurrent Angina-Cath => MV CAD/LM stenosis => CABG  ?CABG x4 (LIMA-LAD, SVG-OM, SVG-DX, SVG-RPL) ?Follow-up 11/26/2019: Noted some increased energy level, but  not back to previous baseline.  Able to walk 1.5-2 miles/day without chest pain or dyspnea. ?06/28/2020: Worsening fatigue, choking sensation of dyspnea and occasional chest tightness.  Occasional orthostatic dizziness ?07/02/2020: Myoview-Intermediate Risk, EF 40%.  Anterolateral ST depression with T wave inversions. => Partially reversible anterolateral and apical defect consistent with infarct and peri-infarct ischemia => ?Cath 07/07/2020: Reviewed below.  Potential culprit lesion-occluded SVG to diagonal.  Recommended medical management.  Added Ranexa.  2D echo ordered to assess wall motion ?2D Echo 07/16/2020: EF 55 to 60%. Paradoxical septal motion consistent with postop state GR 1 DD.  Normal valves.  Normal RV. No change from 09/13/2017 ? ?Tjay C Messing was last seen on April 20, 2021-doing a lot better after titration of medications.  No more chest pain after increasing Ranexa and M-Amlodipine.  Just some mild exertional dyspnea.  Energy level also improving.  Sleeping better.  Sleep study showed mild OSA but did not require CPAP.  Recommended Breathe Right strips or dental exam. => Was using Breathe Right strips and doing better.  Noted dyspnea with heavy exertion and erectile dysfunction.  Improved fatigue. ? ?Recent Hospitalizations: None ? ?Reviewed  CV studies:   ? ?The following studies were reviewed today: (if available, images/films reviewed: From Epic Chart or Care Everywhere) ?No new studies: ? ?Interval History:  ? ?Daniel Buck presents here today with his wife having called in to be seen earlier than planned.  He said a few weeks ago he was having some weird sensation in his chest actually up in his throat where he felt a sort of choking sensation across the upper chest it was off and on for couple days but he also was having quite a bit of GERD type symptoms.  He just sided to take an additional dose of Nexium for couple days and that seemed to help over the.  He has not had any further at this  fluttering, choking discomfort since then.  Interestingly, this symptom was not exertional in nature, it was only at rest.  In fact he is not really having that much in the way of any issues with exertion besides his baseline exertional dyspnea but he is still pretty active. ? ?He did take nitroglycerin 1 time and it did seem to help a little bit but took a little while.  Otherwise he has not taken any. ? ?Second symptom is noting his intermittently having palpitations that they come and go.  Usually the higher dose of beta-blocker has helped, but he has had now 3 spells where he had fleeting moments of tachycardic symptom or skipping beats.  Nothing prolonged.  Not leading to any syncope or near syncope. ? ?CV Review of Symptoms (Summary) ?Cardiovascular ROS: positive for - chest pain, dyspnea on exertion, palpitations, and this is all described above, no further episodes of choking "chest pain "symptom ?negative for - edema, orthopnea,  paroxysmal nocturnal dyspnea, rapid heart rate, shortness of breath, or lightheadedness, dizziness or wooziness, syncope/near syncope or TIA/amaurosis fugax, claudication. ? ?REVIEWED OF SYSTEMS  ? ?Review of Systems  ?Constitutional:  Positive for malaise/fatigue (Still does not have the same energy that he had before.  Still has some exercise intolerance but overall this is better). Negative for weight loss.  ?HENT:  Negative for congestion and sinus pain.   ?Respiratory:  Positive for cough (Sometimes in the morning when he has some congestion). Negative for shortness of breath and wheezing.   ?Gastrointestinal:  Negative for blood in stool and melena.  ?Genitourinary:  Negative for dysuria and hematuria.  ?     Still has ED  ?Musculoskeletal:  Negative for back pain, falls and joint pain.  ?Neurological:  Negative for dizziness, focal weakness and weakness.  ?Psychiatric/Behavioral: Negative.    ?     Although he does seem a little bit down.  Last visit he seemed more positive.   ? ?I have reviewed and (if needed) personally updated the patient's problem list, medications, allergies, past medical and surgical history, social and family history.  ? ?PAST MEDICAL HISTORY  ? ?Past Me

## 2021-07-19 NOTE — Telephone Encounter (Signed)
Started prior authorization -  for dexilant 60 mg daily ? ? Key Daniel Buck ? ? Patient has history of GERDS- ICD-10 k21.9- CAD I25.709 ? Unable to use nexium  ?Patient is taking plavix  ? ?

## 2021-07-19 NOTE — Patient Instructions (Addendum)
Medication Instructions:  ?Start Dexilant 60 mg one tablet daily ? ?Start taking Amlodipine  at lunch time ? ?Stop nexium  ? ?*If you need a refill on your cardiac medications before your next appointment, please call your pharmacy* ? ? ?Lab Work: ?Not needed ?If you have labs (blood work) drawn today and your tests are completely normal, you will receive your results only by: ?MyChart Message (if you have MyChart) OR ?A paper copy in the mail ?If you have any lab test that is abnormal or we need to change your treatment, we will call you to review the results. ? ? ?Testing/Procedures: ? ?Not needed ? ?Follow-Up: ?At [facility], you and your health needs are our priority.  As part of our continuing mission to provide you with exceptional heart care, we have created designated Provider Care Teams.  These Care Teams include your primary Cardiologist (physician) and Advanced Practice Providers (APPs -  Physician Assistants and Nurse Practitioners) who all work together to provide you with the care you need, when you need it. ? ?  ? ?Your next appointment:   ?2 month(s) ? ?The format for your next appointment:   ?In Person ? ?Provider:   ?Glenetta Hew, MD  ? ? ? ?

## 2021-07-21 ENCOUNTER — Encounter: Payer: Self-pay | Admitting: Cardiology

## 2021-07-21 NOTE — Assessment & Plan Note (Signed)
Small first diagonal branch actually jeopardized with the SVG graft being down.  Unfortunately PCI to restore flow to the diagonal branch would likely jeopardize LIMA.  As such, would prefer to avoid that maneuver. ?

## 2021-07-21 NOTE — Assessment & Plan Note (Signed)
He has had interesting symptoms of angina I am not sure that the current symptoms he is having is angina.  It was nonexertional in nature if anything, it could be spasm.  As such for switching the dosing time of his amlodipine. ?

## 2021-07-21 NOTE — Assessment & Plan Note (Signed)
He is on Plavix maintenance dose of 75 mg daily. ?? Okay to hold Plavix 5 to 7 days preop for surgical procedures. ?? Convert PPI from Nexium to Dexilant to avoid interaction with Plavix ?

## 2021-07-21 NOTE — Telephone Encounter (Signed)
Received  a message f medication was denied will have to a  appeal  ? Awaiting for office note to send to insurance ?

## 2021-07-21 NOTE — Assessment & Plan Note (Signed)
Pretty much has been well controlled up until this time around.  He is taking 50 mg metoprolol twice daily.  Not happening every day and his resting heart rate is not that high.  I would prefer to avoid further titrating all the way up, but would allow him to take additional 1/2 to 1 tablet a day as needed for breakthrough spells. ?

## 2021-07-21 NOTE — Assessment & Plan Note (Signed)
Fixed defect seen on Myoview.  Consistent with occluded SVG to diagonal.  Relatively preserved EF however septal bounce.  Difficult to fully assess on echo. ?

## 2021-07-21 NOTE — Assessment & Plan Note (Signed)
I do not know if his symptoms are anginal in nature, seems like is probably more related to GERD.  However could be spasm.  I would like to try to adjust the medications before going to order another stress test which was somewhat confusing last year. ? ?Plan: ?? We will continue current dose of Lopressor-but with palpitations he can take an extra 1/2-1 full tablet as needed. ?? Continue Ranexa 500 mg twice daily ?? Continue amlodipine at 2.5 mg, but change dosing time to lunchtime since his symptoms are mostly in the evening. ?? Current dose of rosuvastatin and Zetia.  Lipids have been controlled. ?? Continue Plavix maintenance.  ?? He has been taking Nexium which has a adverse effect on the effectiveness of Plavix, will switch from Nexium to [place] ?

## 2021-07-21 NOTE — Assessment & Plan Note (Signed)
Blood pressure is well controlled.  Since he is having these weird fluttering sensations in the evening I wonder if there is sometimes a spasm component (not associated with exertion), perhaps switching the timing of taking is What Will Help. ? ?Plan: Switch amlodipine to months time. ? ?

## 2021-07-24 ENCOUNTER — Telehealth: Payer: 59 | Admitting: Family

## 2021-07-24 DIAGNOSIS — R197 Diarrhea, unspecified: Secondary | ICD-10-CM

## 2021-07-24 MED ORDER — ONDANSETRON HCL 4 MG PO TABS
4.0000 mg | ORAL_TABLET | Freq: Three times a day (TID) | ORAL | 0 refills | Status: DC | PRN
  Filled 2021-07-24: qty 20, 7d supply, fill #0

## 2021-07-24 NOTE — Progress Notes (Signed)
?Virtual Visit Consent  ? ?Daniel Buck, you are scheduled for a virtual visit with a [facility] provider today.   ?  ?Just as with appointments in the office, your consent must be obtained to participate.  Your consent will be active for this visit and any virtual visit you may have with one of our providers in the next 365 days.   ?  ?If you have a MyChart account, a copy of this consent can be sent to you electronically.  All virtual visits are billed to your insurance company just like a traditional visit in the office.   ? ?As this is a virtual visit, video technology does not allow for your provider to perform a traditional examination.  This may limit your provider's ability to fully assess your condition.  If your provider identifies any concerns that need to be evaluated in person or the need to arrange testing (such as labs, EKG, etc.), we will make arrangements to do so.   ?  ?Although advances in technology are sophisticated, we cannot ensure that it will always work on either your end or our end.  If the connection with a video visit is poor, the visit may have to be switched to a telephone visit.  With either a video or telephone visit, we are not always able to ensure that we have a secure connection.    ? ?Also, by engaging in this virtual visit, you consent to the provision of healthcare. Additionally, you authorize for your insurance to be billed (if applicable) for the services provided during this visit.  ? ?I need to obtain your verbal consent now.   Are you willing to proceed with your visit today?  ?  ?Daniel Buck has provided verbal consent on 07/24/2021 for a virtual visit (video or telephone). ?  ?Daniel Dun, FNP  ? ?Date: 07/24/2021 6:54 PM ? ? ?Virtual Visit via Video Note  ? Daniel Buck, connected with  Daniel Buck  ([id number], 02/18/1966) on 07/24/21 at  6:45 PM EDT by a video-enabled telemedicine application and verified that I am speaking with the correct person using two  identifiers. ? ?Location: ?Patient: Virtual Visit Location Patient: Home ?Provider: Virtual Visit Location Provider: Home Office ?  ?I discussed the limitations of evaluation and management by telemedicine and the availability of in person appointments. The patient expressed understanding and agreed to proceed.   ? ?History of Present Illness: ?Daniel Buck is a 56 y.o. who identifies as a male who was assigned male at birth, and is being seen today for diarrhea for the last three days. Reports it has slightly improved. Denies any recent antibiotics.  ? ?HPI: Diarrhea  ?This is a new problem. The current episode started in the past 7 days. The problem occurs more than 10 times per day. The problem has been gradually improving. The stool consistency is described as Watery. Associated symptoms include abdominal pain (only when having a BM), bloating, chills (improved), a fever, headaches, increased flatus, myalgias and weight loss (8 lbs). Pertinent negatives include no vomiting. Associated symptoms comments: Nausea ?[name] Nothing aggravates the symptoms. There are no known risk factors. He has tried anti-motility drug for the symptoms. The treatment provided mild relief.   ?Problems:  ?Patient Active Problem List  ? Diagnosis Date Noted  ? Erectile dysfunction 04/20/2021  ? OSA (obstructive sleep apnea) 04/20/2021  ? Fatigue 07/22/2020  ? Coronary artery disease involving coronary bypass graft of native heart with angina  pectoris ([place]) 07/07/2020  ? Abnormal nuclear stress test 07/06/2020  ? Sleep-disordered breathing 07/01/2020  ? Atypical angina ([place]) 06/28/2020  ? Idiopathic gout of multiple sites 05/14/2020  ? Coronary artery disease involving native coronary artery of native heart with angina pectoris ([place]) 11/26/2019  ? S/P CABG x 4 09/04/2019  ? Pre-operative clearance 05/06/2019  ? Tear of triangular fibrocartilage 03/12/2018  ? GERD (gastroesophageal reflux disease) 10/04/2016  ? Cervical spondylosis 10/04/2016   ? Orthostatic dizziness 01/20/2016  ? Memory loss 01/19/2016  ? Carpal tunnel syndrome, right 12/15/2015  ? Carpal tunnel syndrome on right 01/20/2015  ? Osteoarthritis of left wrist 01/20/2015  ? Heart palpitations 02/06/2014  ? Myocardial infarct, old   ? CAD S/P percutaneous coronary angioplasty   ? Hyperlipidemia LDL goal <70   ? Essential hypertension, benign 02/02/2011  ? Ulcerative colitis ([place]) 02/02/2011  ?  ?Allergies:  ?Allergies  ?Allergen Reactions  ? Pantoprazole   ?  GI upset  ? ?Medications:  ?Current Outpatient Medications:  ?  ondansetron (ZOFRAN) 4 MG tablet, Take 1 tablet (4 mg total) by mouth every 8 (eight) hours as needed for nausea or vomiting., Disp: 20 tablet, Rfl: 0 ?  acetaminophen (TYLENOL) 500 MG tablet, Take 1,000 mg by mouth every 6 (six) hours as needed for moderate pain or headache., Disp: , Rfl:  ?  allopurinol (ZYLOPRIM) 300 MG tablet, Take 1 tablet (300 mg total) by mouth daily., Disp: 90 tablet, Rfl: 0 ?  amLODipine (NORVASC) 2.5 MG tablet, Take 1 tablet (2.5 mg total) by mouth daily., Disp: 90 tablet, Rfl: 3 ?  anastrozole (ARIMIDEX) 1 MG tablet, Take 0.5 tablets (0.5 mg total) by mouth once a week., Disp: 6 tablet, Rfl: 1 ?  Cholecalciferol (VITAMIN D) 125 MCG (5000 UT) CAPS, Take 5,000 Units by mouth daily., Disp: , Rfl:  ?  clopidogrel (PLAVIX) 75 MG tablet, Take 1 tablet (75 mg total) by mouth daily., Disp: 90 tablet, Rfl: 3 ?  dexlansoprazole (DEXILANT) 60 MG capsule, Take 1 capsule (60 mg total) by mouth daily., Disp: 30 capsule, Rfl: 11 ?  DHEA 25 MG tablet, See admin instructions., Disp: , Rfl:  ?  ezetimibe (ZETIA) 10 MG tablet, Take 1 tablet (10 mg total) by mouth daily., Disp: 90 tablet, Rfl: 3 ?  mesalamine (LIALDA) 1.2 g EC tablet, Take 1.2 g by mouth daily with breakfast. Take 1.2g daily, May take an additional 3 tablets as needed (total of 4 Tablets daily), Disp: , Rfl:  ?  metoprolol tartrate (LOPRESSOR) 50 MG tablet, Take 1 tablet (50 mg total) by mouth 2  (two) times daily., Disp: 180 tablet, Rfl: 3 ?  nitroGLYCERIN (NITROSTAT) 0.4 MG SL tablet, Place 1 tablet (0.4 mg total) under the tongue every 5 (five) minutes as needed., Disp: 25 tablet, Rfl: 6 ?  ranolazine (RANEXA) 500 MG 12 hr tablet, Take 1 tablet (500 mg total) by mouth 2 (two) times daily., Disp: 180 tablet, Rfl: 3 ?  rosuvastatin (CRESTOR) 40 MG tablet, Take 1 tablet (40 mg total) by mouth daily., Disp: 90 tablet, Rfl: 3 ?  sildenafil (VIAGRA) 50 MG tablet, Take 0.5-2 tablets (25-100 mg total) by mouth as needed for erectile dysfunction, Disp: 10 tablet, Rfl: 11 ?  tadalafil (CIALIS) 5 MG tablet, Take 1 tablet (5 mg total) by mouth daily as needed for [facility] and ED, Disp: 90 tablet, Rfl: 3 ?  Vilazodone HCl (VIIBRYD) 40 MG TABS, Take 1 tablet (40 mg total) by mouth daily with food,  Disp: 90 tablet, Rfl: 3 ? ?Observations/Objective: ?Patient is well-developed, well-nourished in no acute distress.  ?Resting comfortably  at home.  ?Head is normocephalic, atraumatic.  ?No labored breathing.  ?Speech is clear and coherent with logical content.  ?Patient is alert and oriented at baseline.  ?No abdominal pain noted when he pushes  ? ?Assessment and Plan: ?1. Diarrhea, unspecified type ?- ondansetron (ZOFRAN) 4 MG tablet; Take 1 tablet (4 mg total) by mouth every 8 (eight) hours as needed for nausea or vomiting.  Dispense: 20 tablet; Refill: 0 ? ?BRAT diet ?Force fluids ?Imodium as needed  ?Zofran as needed ?If diarrhea does not improve in next 1-2 days will need to be seen to rule out C Diff ?Increase K+ in diet ? ?Follow Up Instructions: ?I discussed the assessment and treatment plan with the patient. The patient was provided an opportunity to ask questions and all were answered. The patient agreed with the plan and demonstrated an understanding of the instructions.  A copy of instructions were sent to the patient via MyChart unless otherwise noted below.  ? ? ? ?The patient was advised to call back or seek an  in-person evaluation if the symptoms worsen or if the condition fails to improve as anticipated. ? ?Time:  ?I spent 8 minutes with the patient via telehealth technology discussing the above problems/concer

## 2021-07-25 ENCOUNTER — Other Ambulatory Visit (HOSPITAL_COMMUNITY): Payer: Self-pay

## 2021-07-28 ENCOUNTER — Other Ambulatory Visit (HOSPITAL_COMMUNITY): Payer: Self-pay

## 2021-07-28 DIAGNOSIS — E291 Testicular hypofunction: Secondary | ICD-10-CM | POA: Diagnosis not present

## 2021-07-28 DIAGNOSIS — R5383 Other fatigue: Secondary | ICD-10-CM | POA: Diagnosis not present

## 2021-07-28 DIAGNOSIS — Z79890 Hormone replacement therapy (postmenopausal): Secondary | ICD-10-CM | POA: Diagnosis not present

## 2021-08-01 DIAGNOSIS — E291 Testicular hypofunction: Secondary | ICD-10-CM | POA: Diagnosis not present

## 2021-08-01 DIAGNOSIS — R6882 Decreased libido: Secondary | ICD-10-CM | POA: Diagnosis not present

## 2021-08-01 DIAGNOSIS — R5383 Other fatigue: Secondary | ICD-10-CM | POA: Diagnosis not present

## 2021-08-01 DIAGNOSIS — Z6830 Body mass index (BMI) 30.0-30.9, adult: Secondary | ICD-10-CM | POA: Diagnosis not present

## 2021-08-04 ENCOUNTER — Telehealth: Payer: 59

## 2021-08-19 ENCOUNTER — Other Ambulatory Visit (HOSPITAL_COMMUNITY): Payer: Self-pay

## 2021-08-19 MED ORDER — PEG 3350-KCL-NA BICARB-NACL 420 G PO SOLR
ORAL | 0 refills | Status: DC
  Filled 2021-08-19: qty 4000, 1d supply, fill #0

## 2021-08-19 MED ORDER — ALLOPURINOL 300 MG PO TABS
300.0000 mg | ORAL_TABLET | Freq: Every day | ORAL | 3 refills | Status: DC
  Filled 2021-08-19: qty 20, 20d supply, fill #0
  Filled 2021-08-19: qty 70, 70d supply, fill #0
  Filled 2021-11-29: qty 90, 90d supply, fill #1
  Filled 2022-02-28: qty 90, 90d supply, fill #2
  Filled 2022-05-29: qty 90, 90d supply, fill #3

## 2021-09-09 ENCOUNTER — Other Ambulatory Visit: Payer: Self-pay

## 2021-09-09 ENCOUNTER — Encounter: Payer: Self-pay | Admitting: Cardiology

## 2021-09-09 ENCOUNTER — Ambulatory Visit: Payer: 59 | Admitting: Cardiology

## 2021-09-09 VITALS — BP 149/88 | HR 90 | Ht 71.0 in | Wt 219.6 lb

## 2021-09-09 DIAGNOSIS — I10 Essential (primary) hypertension: Secondary | ICD-10-CM

## 2021-09-09 DIAGNOSIS — I25119 Atherosclerotic heart disease of native coronary artery with unspecified angina pectoris: Secondary | ICD-10-CM

## 2021-09-09 DIAGNOSIS — Z951 Presence of aortocoronary bypass graft: Secondary | ICD-10-CM | POA: Diagnosis not present

## 2021-09-09 DIAGNOSIS — R002 Palpitations: Secondary | ICD-10-CM

## 2021-09-09 DIAGNOSIS — J302 Other seasonal allergic rhinitis: Secondary | ICD-10-CM | POA: Insufficient documentation

## 2021-09-09 DIAGNOSIS — E785 Hyperlipidemia, unspecified: Secondary | ICD-10-CM

## 2021-09-09 DIAGNOSIS — R0602 Shortness of breath: Secondary | ICD-10-CM | POA: Diagnosis not present

## 2021-09-09 DIAGNOSIS — R5383 Other fatigue: Secondary | ICD-10-CM | POA: Diagnosis not present

## 2021-09-09 DIAGNOSIS — G4730 Sleep apnea, unspecified: Secondary | ICD-10-CM

## 2021-09-09 NOTE — Patient Instructions (Signed)
Medication Instructions:  No changes  *If you need a refill on your cardiac medications before your next appointment, please call your pharmacy*   Lab Work: labs in Dec 20223 Fasting  Lipid Hepatic cbc  If you have labs (blood work) drawn today and your tests are completely normal, you will receive your results only by: [place] (if you have MyChart) OR A paper copy in the mail If you have any lab test that is abnormal or we need to change your treatment, we will call you to review the results.   Testing/Procedures:  Not needed  Follow-Up: At [facility], you and your health needs are our priority.  As part of our continuing mission to provide you with exceptional heart care, we have created designated Provider Care Teams.  These Care Teams include your primary Cardiologist (physician) and Advanced Practice Providers (APPs -  Physician Assistants and Nurse Practitioners) who all work together to provide you with the care you need, when you need it.     Your next appointment:   7 month(s) Jan 2024  The format for your next appointment:   In Person  Provider:   Glenetta Hew, MD   You have been referred to  Ears nose and throat doctor - Dr Benjamine Mola  You have been referred to  [facility] -

## 2021-09-09 NOTE — Progress Notes (Unsigned)
Primary Care Provider: [name], MD Cardiologist: Glenetta Hew, MD Electrophysiologist: None  Clinic Note: No chief complaint on file.   ===================================  ASSESSMENT/PLAN   Problem List Items Addressed This Visit   None   ===================================  HPI:    Daniel Buck is a 56 y.o. male with a PMH below who presents today for ***. Daniel Buck is a 56 y.o. male who is being seen today for the evaluation of *** at the request of [name], MD.  Daniel Buck was last seen on ***  Recent Hospitalizations: ***  Reviewed  CV studies:    The following studies were reviewed today: (if available, images/films reviewed: From Epic Chart or Care Everywhere) ***:   Interval History:   Daniel Buck   CV Review of Symptoms (Summary) Cardiovascular ROS: {roscv:310661}  REVIEWED OF SYSTEMS   ROS  I have reviewed and (if needed) personally updated the patient's problem list, medications, allergies, past medical and surgical history, social and family history.   PAST MEDICAL HISTORY   Past Medical History:  Diagnosis Date   Carpal tunnel syndrome on left 12/2011   Chronic nasal congestion    Depression 01/19/2012   GERD (gastroesophageal reflux disease)    Gout    History of Doppler ultrasound    a. Carotid US 3/11: no ICA stenosis    History of echocardiogram    a. Echo 6/14: mod LVH, EF 50-55%, inf-lat HK, Gr 1 DD, mild LAE   History of hiatal hernia    Hx of CABG 09/04/2019   LIMA-LAD, SVG-OM, SVG-Dx, and an SVG-RPLA.   Hyperlipidemia LDL goal < 70    Hypertension    under control; has been on med. since 1996   Multiple vessel coronary artery disease 1996, '02; '21   a. s/p Inf MI (age 66) >>PCI to RCA with tandem BMS (PS 1535); b. LHC 06/2000: pRCA 30-40%, prox/mid stents with 75% ISR, 30-40% at crux in RCA >> PCI: Cutting Balloon atherectomy for ISR; c. 2012 Myoview-nonischemic; d. 07/2019 -cath with progression of  disease/LM disease => CABG x4   Myocardial infarct, old 1996   PCI   Sleep apnea    no testing yet   Ulcerative colitis ([place])     PAST SURGICAL HISTORY   Past Surgical History:  Procedure Laterality Date   CAROTID DOPPLERS  02/2020   R ICA 1-39%.  R CCA <50%.  LICA no evidence of stenosis.  Minimal plaque in extracranial vessels.  Normal bilateral vertebral arteries and subclavian arteries.   CARPAL TUNNEL RELEASE  01/26/2012   Procedure: CARPAL TUNNEL RELEASE;  Surgeon: Cammie Sickle., MD;  Location: [facility];  Service: Orthopedics;  Laterality: Left;   CORONARY ANGIOPLASTY  07/11/2000   Cutting Balloon PTCA for RCA ISR   CORONARY ARTERY BYPASS GRAFT N/A 09/04/2019   Procedure: CORONARY ARTERY BYPASS GRAFTING (CABG) using LIMA to LAD; Endoscopic Vein Harvest of Right Greater Saphenous: SVG to Diag1; SVG to OM2; SVG to PL.;  Surgeon: Gaye Pollack, MD;  Location: [facility];  Service: Open Heart Surgery;  Laterality: N/A;   CORONARY STENT PLACEMENT  1996   [id number] BMS x 2 in RCA   CYSTECTOMY  as a child   from neck   ENDOVEIN HARVEST OF GREATER SAPHENOUS VEIN Right 09/04/2019   Procedure: [place];  Surgeon: Gaye Pollack, MD;  Location: [facility];  Service: Open Heart Surgery;  Laterality: Right;  Endovein scope harvest of right upper and lower LE.   HERNIA REPAIR     LEFT HEART CATH AND CORONARY ANGIOGRAPHY N/A 08/22/2019   Procedure: LEFT HEART CATH AND CORONARY ANGIOGRAPHY;  Surgeon: Belva Crome, MD;  Location: [facility];;  Eccentric ostial 40 of 50% LM followed by 55-70% distal LM.  65% midLAD (heavily calcified), ostial D1 80%, LCx 70% between OM1 and OM2, RCA large with diffuse ISR prior PCI in 1996 and 2002-up to 90%.  PDA with competitive flow due to collateral flow from LCA.  EF 50-60%.  LVEDP 17    LEFT HEART CATH AND CORS/GRAFTS ANGIOGRAPHY N/A 07/07/2020   Procedure: LEFT HEART CATH AND CORS/GRAFTS ANGIOGRAPHY;   Surgeon: [name], MD;  Location: [facility];; New: 100% RCA CTO, 100% CTO p-m Cx after OM1; CTO SVG-D1 (? Culprit). Stable: Ost-pLM 50%, dLM 60%. Ost D1 80%, mid LAD 60% (pre LIMA) -> Patent LIMA-LAD. OM1 patent w/ diffuse mild Dz. Patent SVG-OM2, SVG--RPL1 (no Competitive native flow).  EF ~ 45% w/ Inflat HK. (Rec Echo)   NM MYOVIEW LTD  02/2011   Inferior infarct but no ischemia   NM MYOVIEW LTD  07/02/2020   EF roughly 40%.  Horizontal ST segment depression in anterolateral leads.  Also T wave inversions noted.  Medium size, partially reversible apical anterior and apical defect concerning for prior infarct with peri-infarct ischemia.  Intermediate risk.  Global HK with septal HK from prior sternotomy.   TEE WITHOUT CARDIOVERSION N/A 09/04/2019   Procedure: INTRAOPERATIVE TRANSESOPHAGEAL ECHOCARDIOGRAM (TEE);  Surgeon: Gaye Pollack, MD;  Location: [facility];  Service: Open Heart Surgery;  Intra-Op TEE: EF 50-55%.  Mild MR normal aortic, pulmonic and tricuspid valves. ->  Stable postop   TONSILLECTOMY  as a child   TRANSTHORACIC ECHOCARDIOGRAM  07/16/2020   EF 55 to 60%.  Paradoxical septal motion c/w Post-OP Sternotomy.  GR 1 DD.  Normal valves.  Normal RV. No change from 09/13/2017   ZIO PATCH EVENT MONITOR  03/2020   Rare PACs and PVCs.  No arrhythmias.  Mostly sinus rhythm rate ranged from 58 to 146 bpm.  Average 81 bpm.    Immunization History  Administered Date(s) Administered   Influenza,inj,Quad PF,6+ Mos 01/17/2013, 04/07/2015, 03/06/2017   Pneumococcal Polysaccharide-23 01/17/2013    MEDICATIONS/ALLERGIES   Current Meds  Medication Sig   acetaminophen (TYLENOL) 500 MG tablet Take 1,000 mg by mouth every 6 (six) hours as needed for moderate pain or headache.   allopurinol (ZYLOPRIM) 300 MG tablet Take 1 tablet (300 mg total) by mouth daily.   amLODipine (NORVASC) 2.5 MG tablet Take 1 tablet (2.5 mg total) by mouth daily.   anastrozole (ARIMIDEX) 1 MG tablet Take  0.5 tablets (0.5 mg total) by mouth once a week.   Cholecalciferol (VITAMIN D) 125 MCG (5000 UT) CAPS Take 5,000 Units by mouth daily.   clopidogrel (PLAVIX) 75 MG tablet Take 1 tablet (75 mg total) by mouth daily.   dexlansoprazole (DEXILANT) 60 MG capsule Take 1 capsule (60 mg total) by mouth daily.   DHEA 25 MG tablet See admin instructions.   ezetimibe (ZETIA) 10 MG tablet Take 1 tablet (10 mg total) by mouth daily.   mesalamine (LIALDA) 1.2 g EC tablet Take 1.2 g by mouth daily with breakfast. Take 1.2g daily, May take an additional 3 tablets as needed (total of 4 Tablets daily)   metoprolol tartrate (LOPRESSOR) 50 MG tablet Take 1 tablet (50 mg  total) by mouth 2 (two) times daily.   nitroGLYCERIN (NITROSTAT) 0.4 MG SL tablet Place 1 tablet (0.4 mg total) under the tongue every 5 (five) minutes as needed.   ondansetron (ZOFRAN) 4 MG tablet Take 1 tablet (4 mg total) by mouth every 8 (eight) hours as needed for nausea or vomiting.   polyethylene glycol-electrolytes (NULYTELY) 420 g solution Take as directed   ranolazine (RANEXA) 500 MG 12 hr tablet Take 1 tablet (500 mg total) by mouth 2 (two) times daily.   rosuvastatin (CRESTOR) 40 MG tablet Take 1 tablet (40 mg total) by mouth daily.   sildenafil (VIAGRA) 50 MG tablet Take 0.5-2 tablets (25-100 mg total) by mouth as needed for erectile dysfunction   tadalafil (CIALIS) 5 MG tablet Take 1 tablet (5 mg total) by mouth daily as needed for [facility] and ED   Vilazodone HCl (VIIBRYD) 40 MG TABS Take 1 tablet (40 mg total) by mouth daily with food    Allergies  Allergen Reactions   Pantoprazole     GI upset    SOCIAL HISTORY/FAMILY HISTORY   Reviewed in Epic:  Pertinent findings:  Social History   Tobacco Use   Smoking status: Former   Smokeless tobacco: Current    Types: Snuff, Chew  Vaping Use   Vaping Use: Never used  Substance Use Topics   Alcohol use: No   Drug use: No   Social History   Social History Narrative   He lives  in [place], [place] near [place] with his wife. [place].  Near the former [address] takes houses his horses.   At baseline, he is extremely active around the farm, but does not have a routine exercise regimen.   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   He quit smoking in 1994.    OBJCTIVE -PE, EKG, labs   Wt Readings from Last 3 Encounters:  09/09/21 219 lb 9.6 oz (99.6 kg)  07/19/21 225 lb 3.2 oz (102.2 kg)  04/20/21 218 lb 9.6 oz (99.2 kg)    Physical Exam: BP (!) 149/88   Pulse 90   Ht 5' 11"  (1.803 m)   Wt 219 lb 9.6 oz (99.6 kg)   SpO2 97%   BMI 30.63 kg/m  Physical Exam   Adult ECG Report  Rate: *** ;  Rhythm: {rhythm:17366};   Narrative Interpretation: ***  Recent Labs:  ***  Lab Results  Component Value Date   CHOL 94 (L) 03/24/2021   HDL 27 (L) 03/24/2021   LDLCALC 49 03/24/2021   TRIG 87 03/24/2021   CHOLHDL 3.5 03/24/2021   Lab Results  Component Value Date   CREATININE 1.28 (H) 03/24/2021   BUN 18 03/24/2021   NA 143 03/24/2021   K 4.6 03/24/2021   CL 104 03/24/2021   CO2 23 03/24/2021      Latest Ref Rng & Units 07/02/2020    8:09 AM 03/23/2020   10:45 AM 09/09/2019    3:01 AM  CBC  WBC 3.4 - 10.8 x10E3/uL 9.0  8.9  10.3   Hemoglobin 13.0 - 17.7 g/dL 14.8  14.3  12.7   Hematocrit 37.5 - 51.0 % 46.8  46.3  38.6   Platelets 150 - 450 x10E3/uL 254  306  238     Lab Results  Component Value Date   HGBA1C 6.1 (H) 09/02/2019   Lab Results  Component Value Date   TSH 2.652 06/14/2016    ==================================================  COVID-19  Education: The signs and symptoms of COVID-19 were discussed with the patient and how to seek care for testing (follow up with PCP or arrange E-visit).    I spent a total of ***minutes with the patient spent in direct patient consultation.  Additional time spent with chart review  / charting (studies, outside notes, etc): *** min Total Time: *** min  Current medicines are  reviewed at length with the patient today.  (+/- concerns) ***  This visit occurred during the SARS-CoV-2 public health emergency.  Safety protocols were in place, including screening questions prior to the visit, additional usage of staff PPE, and extensive cleaning of exam room while observing appropriate contact time as indicated for disinfecting solutions.  Notice: This dictation was prepared with Dragon dictation along with smart phrase technology. Any transcriptional errors that result from this process are unintentional and may not be corrected upon review.  Studies Ordered:   No orders of the defined types were placed in this encounter.  No orders of the defined types were placed in this encounter.   Patient Instructions / Medication Changes & Studies & Tests Ordered   There are no Patient Instructions on file for this visit.     Glenetta Hew, M.D., M.S. Interventional Cardiologist   Pager # [phone] Phone # [phone] [address] [place], [place] [address]   Thank you for choosing Heartcare at [facility]!!

## 2021-09-10 ENCOUNTER — Encounter: Payer: Self-pay | Admitting: Cardiology

## 2021-09-10 NOTE — Assessment & Plan Note (Addendum)
LDL well controlled in December. Continue current dose of rosuvastatin and Zetia.  Follow-up labs ordered.

## 2021-09-10 NOTE — Assessment & Plan Note (Signed)
It seems like he has dyspnea is more seasonal.  He started noticing it in April and is more symptomatic now, however was fine earlier this year.  He has a sense of not really catch his breath or take a deep breath in.  He is a mouth breather because of his nasal congestion.  He says he tries to take allergy pills, and is using nasal sprays, but does not seem to help.  Plan: Referral to Allergy and Immunology as well as ENT

## 2021-09-10 NOTE — Assessment & Plan Note (Signed)
Blood pressure little high today, but he acknowledges being a little bit upset.  We will continue to monitor.  We do have room to titrate amlodipine lowered further.  Otherwise continue current dose of amlodipine and Lopressor.

## 2021-09-10 NOTE — Assessment & Plan Note (Signed)
We have tried statin holiday, and that did not help.  We tried adjusting beta-blocker dose and that did not help, in fact his palpitations got worse.  At this time, it is hard to say that his fatigue and exercise intolerance is truly cardiac in nature.  There does seem to be some component of his allergies involved.  Plan: Refer to Allergy and Immunology as well as ENT

## 2021-09-10 NOTE — Assessment & Plan Note (Signed)
Stable.  Continue beta-blocker at current dose.  PRN additional half tablet if necessary.

## 2021-09-10 NOTE — Assessment & Plan Note (Signed)
Angina seems to be pretty well controlled now.  He is having some exertional dyspnea but almost wonder if this is more related to allergies and sinus issues.  Plan:   Continue current dose of Lopressor 50 mg BID, amlodipine 2.5 mg  Qday, and Ranexa 500 mg BID for antianginal benefit.  Continue current dose of rosuvastatin 40 mg daily and ezetimibe 10 mg.  Continue maintenance clopidogrel/Plavix.  Okay to hold Plavix 7 days preop for surgical procedure.

## 2021-09-10 NOTE — Assessment & Plan Note (Signed)
If shortness of breath is not even that exertional.  He just has a hard time catching a deep breath.  Says it difficult to breathe through his nose.  Referral to ENT As Well As Allergy and Immunology

## 2021-09-14 ENCOUNTER — Other Ambulatory Visit (HOSPITAL_COMMUNITY): Payer: Self-pay

## 2021-09-15 ENCOUNTER — Other Ambulatory Visit (HOSPITAL_COMMUNITY): Payer: Self-pay

## 2021-09-15 DIAGNOSIS — E291 Testicular hypofunction: Secondary | ICD-10-CM | POA: Diagnosis not present

## 2021-09-15 MED ORDER — VILAZODONE HCL 40 MG PO TABS
40.0000 mg | ORAL_TABLET | Freq: Every day | ORAL | 3 refills | Status: DC
  Filled 2021-09-15: qty 90, 90d supply, fill #0
  Filled 2021-12-11 – 2021-12-12 (×2): qty 90, 90d supply, fill #1
  Filled 2022-02-28: qty 90, 90d supply, fill #2
  Filled 2022-05-29: qty 90, 90d supply, fill #3

## 2021-09-19 ENCOUNTER — Inpatient Hospital Stay (HOSPITAL_COMMUNITY)
Admission: EM | Admit: 2021-09-19 | Discharge: 2021-09-21 | DRG: 287 | Disposition: A | Discharge status: Home / Self Care | Payer: 59 | Attending: Cardiovascular Disease | Admitting: Cardiovascular Disease

## 2021-09-19 ENCOUNTER — Emergency Department (HOSPITAL_COMMUNITY): Payer: 59

## 2021-09-19 ENCOUNTER — Inpatient Hospital Stay (HOSPITAL_COMMUNITY)
Admission: EM | Disposition: A | Discharge status: Home / Self Care | Payer: Self-pay | Source: Home / Self Care | Attending: Cardiovascular Disease

## 2021-09-19 ENCOUNTER — Encounter (HOSPITAL_COMMUNITY): Payer: Self-pay | Admitting: Cardiovascular Disease

## 2021-09-19 ENCOUNTER — Other Ambulatory Visit: Payer: Self-pay

## 2021-09-19 ENCOUNTER — Inpatient Hospital Stay (HOSPITAL_COMMUNITY): Payer: 59

## 2021-09-19 ENCOUNTER — Other Ambulatory Visit (HOSPITAL_COMMUNITY): Payer: Self-pay

## 2021-09-19 DIAGNOSIS — Y831 Surgical operation with implant of artificial internal device as the cause of abnormal reaction of the patient, or of later complication, without mention of misadventure at the time of the procedure: Secondary | ICD-10-CM | POA: Diagnosis present

## 2021-09-19 DIAGNOSIS — K219 Gastro-esophageal reflux disease without esophagitis: Secondary | ICD-10-CM | POA: Diagnosis present

## 2021-09-19 DIAGNOSIS — Z8249 Family history of ischemic heart disease and other diseases of the circulatory system: Secondary | ICD-10-CM

## 2021-09-19 DIAGNOSIS — I25810 Atherosclerosis of coronary artery bypass graft(s) without angina pectoris: Secondary | ICD-10-CM

## 2021-09-19 DIAGNOSIS — Z955 Presence of coronary angioplasty implant and graft: Secondary | ICD-10-CM

## 2021-09-19 DIAGNOSIS — I25710 Atherosclerosis of autologous vein coronary artery bypass graft(s) with unstable angina pectoris: Secondary | ICD-10-CM | POA: Diagnosis present

## 2021-09-19 DIAGNOSIS — I2582 Chronic total occlusion of coronary artery: Secondary | ICD-10-CM | POA: Diagnosis not present

## 2021-09-19 DIAGNOSIS — I252 Old myocardial infarction: Secondary | ICD-10-CM

## 2021-09-19 DIAGNOSIS — I10 Essential (primary) hypertension: Secondary | ICD-10-CM | POA: Diagnosis present

## 2021-09-19 DIAGNOSIS — M109 Gout, unspecified: Secondary | ICD-10-CM | POA: Diagnosis present

## 2021-09-19 DIAGNOSIS — I5032 Chronic diastolic (congestive) heart failure: Secondary | ICD-10-CM | POA: Diagnosis present

## 2021-09-19 DIAGNOSIS — R079 Chest pain, unspecified: Secondary | ICD-10-CM | POA: Diagnosis not present

## 2021-09-19 DIAGNOSIS — Z951 Presence of aortocoronary bypass graft: Secondary | ICD-10-CM | POA: Diagnosis not present

## 2021-09-19 DIAGNOSIS — Z79899 Other long term (current) drug therapy: Secondary | ICD-10-CM

## 2021-09-19 DIAGNOSIS — I249 Acute ischemic heart disease, unspecified: Secondary | ICD-10-CM

## 2021-09-19 DIAGNOSIS — I25110 Atherosclerotic heart disease of native coronary artery with unstable angina pectoris: Secondary | ICD-10-CM | POA: Diagnosis not present

## 2021-09-19 DIAGNOSIS — I213 ST elevation (STEMI) myocardial infarction of unspecified site: Secondary | ICD-10-CM | POA: Insufficient documentation

## 2021-09-19 DIAGNOSIS — E785 Hyperlipidemia, unspecified: Secondary | ICD-10-CM | POA: Diagnosis present

## 2021-09-19 DIAGNOSIS — F32A Depression, unspecified: Secondary | ICD-10-CM | POA: Diagnosis present

## 2021-09-19 DIAGNOSIS — G4733 Obstructive sleep apnea (adult) (pediatric): Secondary | ICD-10-CM | POA: Diagnosis present

## 2021-09-19 DIAGNOSIS — I2111 ST elevation (STEMI) myocardial infarction involving right coronary artery: Secondary | ICD-10-CM | POA: Insufficient documentation

## 2021-09-19 DIAGNOSIS — R0689 Other abnormalities of breathing: Secondary | ICD-10-CM | POA: Diagnosis not present

## 2021-09-19 DIAGNOSIS — I110 Hypertensive heart disease with heart failure: Secondary | ICD-10-CM | POA: Diagnosis not present

## 2021-09-19 DIAGNOSIS — R110 Nausea: Secondary | ICD-10-CM | POA: Diagnosis not present

## 2021-09-19 DIAGNOSIS — I2510 Atherosclerotic heart disease of native coronary artery without angina pectoris: Secondary | ICD-10-CM

## 2021-09-19 DIAGNOSIS — R52 Pain, unspecified: Secondary | ICD-10-CM | POA: Diagnosis not present

## 2021-09-19 DIAGNOSIS — Z20822 Contact with and (suspected) exposure to covid-19: Secondary | ICD-10-CM | POA: Diagnosis present

## 2021-09-19 DIAGNOSIS — I2584 Coronary atherosclerosis due to calcified coronary lesion: Secondary | ICD-10-CM | POA: Diagnosis present

## 2021-09-19 DIAGNOSIS — T82855A Stenosis of coronary artery stent, initial encounter: Secondary | ICD-10-CM | POA: Diagnosis not present

## 2021-09-19 HISTORY — PX: LEFT HEART CATH AND CORS/GRAFTS ANGIOGRAPHY: CATH118250

## 2021-09-19 LAB — CBC
HCT: 47.3 % (ref 39.0–52.0)
HCT: 45.9 % (ref 39.0–52.0)
Hemoglobin: 15 g/dL (ref 13.0–17.0)
Hemoglobin: 14.6 g/dL (ref 13.0–17.0)
MCH: 25.4 pg — ABNORMAL LOW (ref 26.0–34.0)
MCH: 25.2 pg — ABNORMAL LOW (ref 26.0–34.0)
MCHC: 31.7 g/dL (ref 30.0–36.0)
MCHC: 31.8 g/dL (ref 30.0–36.0)
MCV: 80 fL (ref 80.0–100.0)
MCV: 79.3 fL — ABNORMAL LOW (ref 80.0–100.0)
Platelets: 254 10*3/uL (ref 150–400)
Platelets: 235 10*3/uL (ref 150–400)
RBC: 5.91 MIL/uL — ABNORMAL HIGH (ref 4.22–5.81)
RBC: 5.79 MIL/uL (ref 4.22–5.81)
RDW: 18.7 % — ABNORMAL HIGH (ref 11.5–15.5)
RDW: 18.6 % — ABNORMAL HIGH (ref 11.5–15.5)
WBC: 8.8 10*3/uL (ref 4.0–10.5)
WBC: 9 10*3/uL (ref 4.0–10.5)
nRBC: 0 % (ref 0.0–0.2)
nRBC: 0 % (ref 0.0–0.2)

## 2021-09-19 LAB — ECHOCARDIOGRAM COMPLETE
Area-P 1/2: 3.37 cm2
Calc EF: 52 %
Height: 71 in
S' Lateral: 3.8 cm
Single Plane A2C EF: 48.4 %
Single Plane A4C EF: 53.8 %
Weight: 3440 oz

## 2021-09-19 LAB — BASIC METABOLIC PANEL
Anion gap: 8 (ref 5–15)
Anion gap: 8 (ref 5–15)
BUN: 20 mg/dL (ref 6–20)
BUN: 20 mg/dL (ref 6–20)
CO2: 24 mmol/L (ref 22–32)
CO2: 24 mmol/L (ref 22–32)
Calcium: 9 mg/dL (ref 8.9–10.3)
Calcium: 9.1 mg/dL (ref 8.9–10.3)
Chloride: 105 mmol/L (ref 98–111)
Chloride: 105 mmol/L (ref 98–111)
Creatinine, Ser: 1.51 mg/dL — ABNORMAL HIGH (ref 0.61–1.24)
Creatinine, Ser: 1.38 mg/dL — ABNORMAL HIGH (ref 0.61–1.24)
GFR, Estimated: 54 mL/min — ABNORMAL LOW (ref >60)
GFR, Estimated: >60 mL/min (ref >60)
Glucose, Bld: 121 mg/dL — ABNORMAL HIGH (ref 70–99)
Glucose, Bld: 150 mg/dL — ABNORMAL HIGH (ref 70–99)
Potassium: 3.2 mmol/L — ABNORMAL LOW (ref 3.5–5.1)
Potassium: 3.8 mmol/L (ref 3.5–5.1)
Sodium: 137 mmol/L (ref 135–145)
Sodium: 137 mmol/L (ref 135–145)

## 2021-09-19 LAB — LIPID PANEL
Cholesterol: 83 mg/dL (ref 0–200)
HDL: 27 mg/dL — ABNORMAL LOW (ref >40)
LDL Cholesterol: 29 mg/dL (ref 0–99)
Total CHOL/HDL Ratio: 3.1 RATIO
Triglycerides: 136 mg/dL (ref <150)
VLDL: 27 mg/dL (ref 0–40)

## 2021-09-19 LAB — RESP PANEL BY RT-PCR (FLU A&B, COVID) ARPGX2
Influenza A by PCR: NEGATIVE
Influenza B by PCR: NEGATIVE
SARS Coronavirus 2 by RT PCR: NEGATIVE

## 2021-09-19 LAB — POCT ACTIVATED CLOTTING TIME: Activated Clotting Time: 143 seconds

## 2021-09-19 LAB — HIV ANTIBODY (ROUTINE TESTING W REFLEX): HIV Screen 4th Generation wRfx: NONREACTIVE

## 2021-09-19 LAB — TROPONIN I (HIGH SENSITIVITY)
Troponin I (High Sensitivity): 4 ng/L (ref <18)
Troponin I (High Sensitivity): 14 ng/L (ref <18)

## 2021-09-19 LAB — HEMOGLOBIN A1C
Hgb A1c MFr Bld: 5.7 % — ABNORMAL HIGH (ref 4.8–5.6)
Mean Plasma Glucose: 116.89 mg/dL

## 2021-09-19 LAB — HEPARIN LEVEL (UNFRACTIONATED): Heparin Unfractionated: 0.12 IU/mL — ABNORMAL LOW (ref 0.30–0.70)

## 2021-09-19 LAB — PROTIME-INR
INR: 1 (ref 0.8–1.2)
Prothrombin Time: 13.5 seconds (ref 11.4–15.2)

## 2021-09-19 LAB — GLUCOSE, CAPILLARY: Glucose-Capillary: 142 mg/dL — ABNORMAL HIGH (ref 70–99)

## 2021-09-19 LAB — APTT: aPTT: 36 seconds (ref 24–36)

## 2021-09-19 SURGERY — LEFT HEART CATH AND CORS/GRAFTS ANGIOGRAPHY
Anesthesia: LOCAL

## 2021-09-19 MED ORDER — ONDANSETRON HCL 4 MG PO TABS: 4.0000 mg | ORAL_TABLET | Freq: Three times a day (TID) | ORAL | Status: DC | PRN

## 2021-09-19 MED ORDER — PANTOPRAZOLE SODIUM 40 MG PO TBEC
40.0000 mg | DELAYED_RELEASE_TABLET | Freq: Every day | ORAL | Status: DC
  Filled 2021-09-19: qty 1

## 2021-09-19 MED ORDER — SODIUM CHLORIDE 0.9 % IV SOLN: INTRAVENOUS | Status: AC

## 2021-09-19 MED ORDER — LIDOCAINE HCL (PF) 1 % IJ SOLN
INTRAMUSCULAR | Status: AC
  Filled 2021-09-19: qty 30

## 2021-09-19 MED ORDER — LABETALOL HCL 5 MG/ML IV SOLN: 10.0000 mg | INTRAVENOUS | Status: AC | PRN

## 2021-09-19 MED ORDER — NITROGLYCERIN 0.4 MG SL SUBL
0.4000 mg | SUBLINGUAL_TABLET | SUBLINGUAL | Status: DC | PRN
  Administered 2021-09-19 (×4): 0.4 mg via SUBLINGUAL
  Filled 2021-09-19: qty 1

## 2021-09-19 MED ORDER — ESOMEPRAZOLE MAGNESIUM 20 MG PO CPDR
40.0000 mg | DELAYED_RELEASE_CAPSULE | Freq: Two times a day (BID) | ORAL | Status: DC
  Administered 2021-09-19 – 2021-09-21 (×4): 40 mg via ORAL
  Filled 2021-09-19 (×6): qty 2

## 2021-09-19 MED ORDER — HEPARIN (PORCINE) IN NACL 1000-0.9 UT/500ML-% IV SOLN
INTRAVENOUS | Status: AC
  Filled 2021-09-19: qty 500

## 2021-09-19 MED ORDER — HEPARIN (PORCINE) 25000 UT/250ML-% IV SOLN
1450.0000 [IU]/h | INTRAVENOUS | Status: DC
  Administered 2021-09-19 – 2021-09-20 (×3): 1550 [IU]/h via INTRAVENOUS
  Filled 2021-09-19 (×2): qty 250

## 2021-09-19 MED ORDER — HYDRALAZINE HCL 20 MG/ML IJ SOLN: 10.0000 mg | INTRAMUSCULAR | Status: AC | PRN

## 2021-09-19 MED ORDER — FENTANYL CITRATE PF 50 MCG/ML IJ SOSY
50.0000 ug | PREFILLED_SYRINGE | Freq: Once | INTRAMUSCULAR | Status: AC
  Administered 2021-09-19: 50 ug via INTRAVENOUS
  Filled 2021-09-19: qty 1

## 2021-09-19 MED ORDER — MORPHINE SULFATE (PF) 2 MG/ML IV SOLN
1.0000 mg | Freq: Once | INTRAVENOUS | Status: AC
  Administered 2021-09-19: 1 mg via INTRAVENOUS
  Filled 2021-09-19: qty 1

## 2021-09-19 MED ORDER — NITROGLYCERIN IN D5W 200-5 MCG/ML-% IV SOLN
0.0000 ug/min | INTRAVENOUS | Status: DC
  Administered 2021-09-19: 5 ug/min via INTRAVENOUS
  Filled 2021-09-19: qty 250

## 2021-09-19 MED ORDER — FENTANYL CITRATE (PF) 100 MCG/2ML IJ SOLN
INTRAMUSCULAR | Status: DC | PRN
  Administered 2021-09-19: 50 ug via INTRAVENOUS

## 2021-09-19 MED ORDER — NITROGLYCERIN IN D5W 200-5 MCG/ML-% IV SOLN: 0.0000 ug/min | INTRAVENOUS | Status: DC

## 2021-09-19 MED ORDER — CHLORHEXIDINE GLUCONATE CLOTH 2 % EX PADS
6.0000 | MEDICATED_PAD | Freq: Every day | CUTANEOUS | Status: DC
  Administered 2021-09-19 – 2021-09-20 (×2): 6 via TOPICAL

## 2021-09-19 MED ORDER — CLOPIDOGREL BISULFATE 75 MG PO TABS
75.0000 mg | ORAL_TABLET | Freq: Every day | ORAL | Status: DC
  Filled 2021-09-19: qty 1

## 2021-09-19 MED ORDER — NITROGLYCERIN 0.4 MG SL SUBL: 0.4000 mg | SUBLINGUAL_TABLET | SUBLINGUAL | Status: DC | PRN

## 2021-09-19 MED ORDER — SODIUM CHLORIDE 0.9 % IV SOLN: INTRAVENOUS | Status: DC

## 2021-09-19 MED ORDER — ACETAMINOPHEN 500 MG PO TABS: 1000.0000 mg | ORAL_TABLET | Freq: Four times a day (QID) | ORAL | Status: DC | PRN

## 2021-09-19 MED ORDER — ONDANSETRON HCL 4 MG/2ML IJ SOLN: 4.0000 mg | Freq: Four times a day (QID) | INTRAMUSCULAR | Status: DC | PRN

## 2021-09-19 MED ORDER — HEPARIN (PORCINE) IN NACL 1000-0.9 UT/500ML-% IV SOLN
INTRAVENOUS | Status: DC | PRN
  Administered 2021-09-19 (×2): 500 mL

## 2021-09-19 MED ORDER — EZETIMIBE 10 MG PO TABS
10.0000 mg | ORAL_TABLET | Freq: Every day | ORAL | Status: DC
  Administered 2021-09-19 – 2021-09-20 (×2): 10 mg via ORAL
  Filled 2021-09-19 (×2): qty 1

## 2021-09-19 MED ORDER — ALLOPURINOL 100 MG PO TABS
300.0000 mg | ORAL_TABLET | Freq: Every day | ORAL | Status: DC
  Filled 2021-09-19: qty 3

## 2021-09-19 MED ORDER — SODIUM CHLORIDE 0.9% FLUSH
3.0000 mL | Freq: Two times a day (BID) | INTRAVENOUS | Status: DC
  Administered 2021-09-19 – 2021-09-20 (×4): 3 mL via INTRAVENOUS

## 2021-09-19 MED ORDER — AMLODIPINE BESYLATE 2.5 MG PO TABS
2.5000 mg | ORAL_TABLET | Freq: Every day | ORAL | Status: DC
  Administered 2021-09-20: 2.5 mg via ORAL
  Filled 2021-09-19: qty 1

## 2021-09-19 MED ORDER — CLOPIDOGREL BISULFATE 75 MG PO TABS: 75.0000 mg | ORAL_TABLET | Freq: Every day | ORAL | Status: DC

## 2021-09-19 MED ORDER — DHEA 25 MG PO TABS: 25.0000 mg | ORAL_TABLET | Freq: Every day | ORAL | Status: DC

## 2021-09-19 MED ORDER — SODIUM CHLORIDE 0.9 % IV SOLN: 250.0000 mL | INTRAVENOUS | Status: DC | PRN

## 2021-09-19 MED ORDER — VILAZODONE HCL 20 MG PO TABS
40.0000 mg | ORAL_TABLET | Freq: Every day | ORAL | Status: DC
  Administered 2021-09-19 – 2021-09-21 (×3): 40 mg via ORAL
  Filled 2021-09-19 (×3): qty 2

## 2021-09-19 MED ORDER — AMLODIPINE BESYLATE 5 MG PO TABS
2.5000 mg | ORAL_TABLET | Freq: Every day | ORAL | Status: DC
  Filled 2021-09-19: qty 1

## 2021-09-19 MED ORDER — HEPARIN SODIUM (PORCINE) 5000 UNIT/ML IJ SOLN
4000.0000 [IU] | Freq: Once | INTRAMUSCULAR | Status: AC
  Administered 2021-09-19: 4000 [IU] via INTRAVENOUS
  Filled 2021-09-19: qty 1

## 2021-09-19 MED ORDER — SODIUM CHLORIDE 0.9% FLUSH: 3.0000 mL | INTRAVENOUS | Status: DC | PRN

## 2021-09-19 MED ORDER — EZETIMIBE 10 MG PO TABS
10.0000 mg | ORAL_TABLET | Freq: Every day | ORAL | Status: DC
  Filled 2021-09-19: qty 1

## 2021-09-19 MED ORDER — IOHEXOL 350 MG/ML SOLN
INTRAVENOUS | Status: DC | PRN
  Administered 2021-09-19: 100 mL

## 2021-09-19 MED ORDER — MIDAZOLAM HCL 2 MG/2ML IJ SOLN
INTRAMUSCULAR | Status: DC | PRN
  Administered 2021-09-19: 2 mg via INTRAVENOUS

## 2021-09-19 MED ORDER — ASPIRIN 81 MG PO CHEW
81.0000 mg | CHEWABLE_TABLET | Freq: Every day | ORAL | Status: DC
  Administered 2021-09-19 – 2021-09-20 (×2): 81 mg via ORAL
  Filled 2021-09-19 (×3): qty 1

## 2021-09-19 MED ORDER — DIAZEPAM 5 MG PO TABS: 5.0000 mg | ORAL_TABLET | Freq: Four times a day (QID) | ORAL | Status: DC | PRN

## 2021-09-19 MED ORDER — NITROGLYCERIN IN D5W 200-5 MCG/ML-% IV SOLN
2.0000 ug/min | INTRAVENOUS | Status: DC
  Administered 2021-09-19: 5 ug/min via INTRAVENOUS

## 2021-09-19 MED ORDER — RANOLAZINE ER 500 MG PO TB12
500.0000 mg | ORAL_TABLET | Freq: Two times a day (BID) | ORAL | Status: DC
  Administered 2021-09-19 – 2021-09-21 (×5): 500 mg via ORAL
  Filled 2021-09-19 (×5): qty 1

## 2021-09-19 MED ORDER — HEPARIN (PORCINE) 25000 UT/250ML-% IV SOLN
1250.0000 [IU]/h | INTRAVENOUS | Status: DC
  Filled 2021-09-19: qty 250

## 2021-09-19 MED ORDER — METOPROLOL TARTRATE 50 MG PO TABS
50.0000 mg | ORAL_TABLET | Freq: Two times a day (BID) | ORAL | Status: DC
  Administered 2021-09-19 – 2021-09-20 (×3): 50 mg via ORAL
  Filled 2021-09-19 (×3): qty 1

## 2021-09-19 MED ORDER — ACETAMINOPHEN 325 MG PO TABS: 650.0000 mg | ORAL_TABLET | ORAL | Status: DC | PRN

## 2021-09-19 MED ORDER — FENTANYL CITRATE (PF) 100 MCG/2ML IJ SOLN
INTRAMUSCULAR | Status: AC
  Filled 2021-09-19: qty 2

## 2021-09-19 MED ORDER — ALLOPURINOL 300 MG PO TABS
300.0000 mg | ORAL_TABLET | Freq: Every day | ORAL | Status: DC
  Administered 2021-09-19 – 2021-09-20 (×2): 300 mg via ORAL
  Filled 2021-09-19 (×2): qty 1

## 2021-09-19 MED ORDER — MESALAMINE 1.2 G PO TBEC
1.2000 g | DELAYED_RELEASE_TABLET | Freq: Every day | ORAL | Status: DC
  Administered 2021-09-19 – 2021-09-20 (×2): 1.2 g via ORAL
  Filled 2021-09-19 (×4): qty 1

## 2021-09-19 MED ORDER — HEPARIN (PORCINE) IN NACL 2000-0.9 UNIT/L-% IV SOLN
INTRAVENOUS | Status: AC
  Filled 2021-09-19: qty 1000

## 2021-09-19 MED ORDER — VITAMIN D3 25 MCG (1000 UNIT) PO TABS
5000.0000 [IU] | ORAL_TABLET | Freq: Every day | ORAL | Status: DC
  Administered 2021-09-20: 5000 [IU] via ORAL
  Filled 2021-09-19 (×4): qty 5

## 2021-09-19 MED ORDER — ASPIRIN 81 MG PO TBEC: 81.0000 mg | DELAYED_RELEASE_TABLET | Freq: Every day | ORAL | Status: DC

## 2021-09-19 MED ORDER — MIDAZOLAM HCL 2 MG/2ML IJ SOLN
INTRAMUSCULAR | Status: AC
  Filled 2021-09-19: qty 2

## 2021-09-19 MED ORDER — ROSUVASTATIN CALCIUM 20 MG PO TABS
40.0000 mg | ORAL_TABLET | Freq: Every day | ORAL | Status: DC
  Administered 2021-09-19 – 2021-09-21 (×3): 40 mg via ORAL
  Filled 2021-09-19 (×3): qty 2

## 2021-09-19 MED ORDER — TICAGRELOR 90 MG PO TABS
90.0000 mg | ORAL_TABLET | Freq: Two times a day (BID) | ORAL | Status: DC
  Administered 2021-09-19 – 2021-09-21 (×5): 90 mg via ORAL
  Filled 2021-09-19 (×5): qty 1

## 2021-09-19 MED ORDER — ESOMEPRAZOLE MAGNESIUM 40 MG PO CPDR
40.0000 mg | DELAYED_RELEASE_CAPSULE | Freq: Two times a day (BID) | ORAL | 3 refills | Status: DC
  Filled 2021-09-19: qty 180, 90d supply, fill #0

## 2021-09-19 SURGICAL SUPPLY — 13 items
CATH INFINITI 5 FR IM (CATHETERS) ×1 IMPLANT
CATH INFINITI 5FR JL4 (CATHETERS) ×1 IMPLANT
CATH INFINITI JR4 5F (CATHETERS) ×1 IMPLANT
CLOSURE MYNX CONTROL 6F/7F (Vascular Products) ×1 IMPLANT
KIT HEART LEFT (KITS) ×2 IMPLANT
PACK CARDIAC CATHETERIZATION (CUSTOM PROCEDURE TRAY) ×2 IMPLANT
SHEATH PINNACLE 6F 10CM (SHEATH) ×1 IMPLANT
SHEATH PROBE COVER 6X72 (BAG) ×1 IMPLANT
SYR MEDRAD MARK 7 150ML (SYRINGE) ×2 IMPLANT
TRANSDUCER W/STOPCOCK (MISCELLANEOUS) ×2 IMPLANT
TUBING CIL FLEX 10 FLL-RA (TUBING) ×2 IMPLANT
WIRE EMERALD 3MM-J .025X260CM (WIRE) ×1 IMPLANT
WIRE EMERALD 3MM-J .035X150CM (WIRE) ×1 IMPLANT

## 2021-09-19 NOTE — Progress Notes (Signed)
Team C Rounding Note: Patient admitted just a few hours ago by Dr. Tresa Endo.  He underwent emergency cardiac catheterization for acute inferior STEMI and was found to have total occlusion of the SVG to PDA.  The PDA was well collateralized and decision made to treat medically due to concerns about distal embolization and extension of infarct.  Interesting that his high-sensitivity troponin is now negative x2 with labs separated by 3 hours.  This lab finding, along with the presence of Q waves, suggest that his infarct may be subacute.  He has now had 2 separate episodes of ACS associated with vein graft occlusion.  There is 1 patent vein graft remaining as well as the LIMA to LAD.  I am going to switch him from clopidogrel to ticagrelor.  Otherwise we will continue his aggressive medical therapy.  Lengthy discussion with the patient this morning.  His chest and throat discomfort have resolved.  The patient's wife is notified of our plans for medical treatment over the telephone today.  Tonny Bollman 09/19/2021 8:21 AM

## 2021-09-19 NOTE — ED Notes (Signed)
Cardiologist at bedside.  

## 2021-09-19 NOTE — ED Notes (Signed)
Groin shaved and cleaned.

## 2021-09-20 ENCOUNTER — Other Ambulatory Visit (HOSPITAL_COMMUNITY): Payer: Self-pay

## 2021-09-20 DIAGNOSIS — I2111 ST elevation (STEMI) myocardial infarction involving right coronary artery: Secondary | ICD-10-CM | POA: Diagnosis not present

## 2021-09-20 LAB — BASIC METABOLIC PANEL
Anion gap: 10 (ref 5–15)
BUN: 14 mg/dL (ref 6–20)
CO2: 23 mmol/L (ref 22–32)
Calcium: 8.9 mg/dL (ref 8.9–10.3)
Chloride: 105 mmol/L (ref 98–111)
Creatinine, Ser: 1.29 mg/dL — ABNORMAL HIGH (ref 0.61–1.24)
GFR, Estimated: >60 mL/min (ref >60)
Glucose, Bld: 115 mg/dL — ABNORMAL HIGH (ref 70–99)
Potassium: 3.8 mmol/L (ref 3.5–5.1)
Sodium: 138 mmol/L (ref 135–145)

## 2021-09-20 LAB — CBC
HCT: 46.1 % (ref 39.0–52.0)
Hemoglobin: 14.4 g/dL (ref 13.0–17.0)
MCH: 24.6 pg — ABNORMAL LOW (ref 26.0–34.0)
MCHC: 31.2 g/dL (ref 30.0–36.0)
MCV: 78.8 fL — ABNORMAL LOW (ref 80.0–100.0)
Platelets: 241 10*3/uL (ref 150–400)
RBC: 5.85 MIL/uL — ABNORMAL HIGH (ref 4.22–5.81)
RDW: 18.6 % — ABNORMAL HIGH (ref 11.5–15.5)
WBC: 12.2 10*3/uL — ABNORMAL HIGH (ref 4.0–10.5)
nRBC: 0 % (ref 0.0–0.2)

## 2021-09-20 LAB — LIPOPROTEIN A (LPA): Lipoprotein (a): 25.9 nmol/L (ref <75.0)

## 2021-09-20 LAB — HEPARIN LEVEL (UNFRACTIONATED): Heparin Unfractionated: 0.45 IU/mL (ref 0.30–0.70)

## 2021-09-20 MED ORDER — EMPAGLIFLOZIN 10 MG PO TABS
10.0000 mg | ORAL_TABLET | Freq: Every day | ORAL | Status: DC
Start: 2021-09-20 — End: 2021-09-21
  Administered 2021-09-20 – 2021-09-21 (×2): 10 mg via ORAL
  Filled 2021-09-20 (×2): qty 1

## 2021-09-20 MED ORDER — LOSARTAN POTASSIUM 25 MG PO TABS
25.0000 mg | ORAL_TABLET | Freq: Every day | ORAL | Status: DC
  Administered 2021-09-20: 25 mg via ORAL
  Filled 2021-09-20: qty 1

## 2021-09-20 MED FILL — Lidocaine HCl Local Preservative Free (PF) Inj 1%: INTRAMUSCULAR | Qty: 30 | Status: AC

## 2021-09-21 ENCOUNTER — Other Ambulatory Visit (HOSPITAL_COMMUNITY): Payer: Self-pay

## 2021-09-21 ENCOUNTER — Encounter (HOSPITAL_COMMUNITY): Payer: Self-pay | Admitting: Cardiovascular Disease

## 2021-09-21 DIAGNOSIS — I249 Acute ischemic heart disease, unspecified: Secondary | ICD-10-CM

## 2021-09-21 LAB — BASIC METABOLIC PANEL
Anion gap: 10 (ref 5–15)
BUN: 15 mg/dL (ref 6–20)
CO2: 23 mmol/L (ref 22–32)
Calcium: 9.1 mg/dL (ref 8.9–10.3)
Chloride: 106 mmol/L (ref 98–111)
Creatinine, Ser: 1.46 mg/dL — ABNORMAL HIGH (ref 0.61–1.24)
GFR, Estimated: 56 mL/min — ABNORMAL LOW (ref >60)
Glucose, Bld: 101 mg/dL — ABNORMAL HIGH (ref 70–99)
Potassium: 3.7 mmol/L (ref 3.5–5.1)
Sodium: 139 mmol/L (ref 135–145)

## 2021-09-21 LAB — CBC
HCT: 46.1 % (ref 39.0–52.0)
Hemoglobin: 14.2 g/dL (ref 13.0–17.0)
MCH: 24.5 pg — ABNORMAL LOW (ref 26.0–34.0)
MCHC: 30.8 g/dL (ref 30.0–36.0)
MCV: 79.5 fL — ABNORMAL LOW (ref 80.0–100.0)
Platelets: 216 10*3/uL (ref 150–400)
RBC: 5.8 MIL/uL (ref 4.22–5.81)
RDW: 18.8 % — ABNORMAL HIGH (ref 11.5–15.5)
WBC: 13.4 10*3/uL — ABNORMAL HIGH (ref 4.0–10.5)
nRBC: 0 % (ref 0.0–0.2)

## 2021-09-21 LAB — HEPARIN LEVEL (UNFRACTIONATED): Heparin Unfractionated: 0.71 IU/mL — ABNORMAL HIGH (ref 0.30–0.70)

## 2021-09-21 LAB — LIPOPROTEIN A (LPA): Lipoprotein (a): 27.9 nmol/L (ref <75.0)

## 2021-09-21 MED ORDER — EMPAGLIFLOZIN 10 MG PO TABS
10.0000 mg | ORAL_TABLET | Freq: Every day | ORAL | 3 refills | Status: DC
  Filled 2021-09-21: qty 90, 90d supply, fill #0

## 2021-09-21 MED ORDER — TICAGRELOR 90 MG PO TABS
90.0000 mg | ORAL_TABLET | Freq: Two times a day (BID) | ORAL | 3 refills | Status: DC
  Filled 2021-09-21 – 2021-12-13 (×2): qty 180, 90d supply, fill #0
  Filled 2022-02-28: qty 180, 90d supply, fill #1
  Filled 2022-05-29: qty 180, 90d supply, fill #2

## 2021-09-21 MED ORDER — METOPROLOL SUCCINATE ER 100 MG PO TB24
100.0000 mg | ORAL_TABLET | Freq: Every day | ORAL | 3 refills | Status: DC
  Filled 2021-09-21: qty 90, 90d supply, fill #0

## 2021-09-21 MED ORDER — METOPROLOL SUCCINATE ER 100 MG PO TB24
100.0000 mg | ORAL_TABLET | Freq: Every day | ORAL | 3 refills | Status: DC
  Filled 2021-09-21 – 2021-12-13 (×2): qty 90, 90d supply, fill #0
  Filled 2022-03-05: qty 90, 90d supply, fill #1
  Filled 2022-05-29: qty 90, 90d supply, fill #2

## 2021-09-21 MED ORDER — LOSARTAN POTASSIUM 25 MG PO TABS
25.0000 mg | ORAL_TABLET | Freq: Every day | ORAL | 11 refills | Status: DC
  Filled 2021-09-21: qty 30, 30d supply, fill #0

## 2021-09-21 MED ORDER — METOPROLOL SUCCINATE ER 100 MG PO TB24
100.0000 mg | ORAL_TABLET | Freq: Every day | ORAL | Status: DC
  Filled 2021-09-21: qty 1

## 2021-09-21 MED ORDER — ASPIRIN 81 MG PO TBEC
81.0000 mg | DELAYED_RELEASE_TABLET | Freq: Every day | ORAL | 3 refills | Status: DC
  Filled 2021-09-21 – 2021-12-13 (×2): qty 90, 90d supply, fill #0
  Filled 2022-02-28: qty 90, 90d supply, fill #1
  Filled 2022-05-29: qty 90, 90d supply, fill #2

## 2021-09-21 MED ORDER — EMPAGLIFLOZIN 10 MG PO TABS
10.0000 mg | ORAL_TABLET | Freq: Every day | ORAL | 3 refills | Status: DC
  Filled 2021-09-21 – 2021-12-13 (×2): qty 90, 90d supply, fill #0
  Filled 2022-02-28: qty 90, 90d supply, fill #1
  Filled 2022-05-29: qty 90, 90d supply, fill #2

## 2021-09-21 MED ORDER — TICAGRELOR 90 MG PO TABS
90.0000 mg | ORAL_TABLET | Freq: Two times a day (BID) | ORAL | 3 refills | Status: DC
  Filled 2021-09-21: qty 180, 90d supply, fill #0

## 2021-09-21 MED ORDER — ASPIRIN 81 MG PO TBEC
81.0000 mg | DELAYED_RELEASE_TABLET | Freq: Every day | ORAL | 3 refills | Status: DC
  Filled 2021-09-21: qty 90, 90d supply, fill #0

## 2021-09-21 MED ORDER — ESOMEPRAZOLE MAGNESIUM 40 MG PO CPDR: 40.0000 mg | DELAYED_RELEASE_CAPSULE | Freq: Two times a day (BID) | ORAL | Status: DC

## 2021-09-21 MED ORDER — LOSARTAN POTASSIUM 25 MG PO TABS
25.0000 mg | ORAL_TABLET | Freq: Every day | ORAL | 11 refills | Status: DC
  Filled 2021-09-21: qty 90, 90d supply, fill #0
  Filled 2021-12-05: qty 44, 44d supply, fill #0
  Filled 2021-12-05: qty 46, 46d supply, fill #0
  Filled 2022-02-28: qty 90, 90d supply, fill #1
  Filled 2022-05-29: qty 90, 90d supply, fill #2

## 2021-09-21 NOTE — Consult Note (Signed)
   [facility] CM Inpatient Consult   09/21/2021  Daniel Buck 04-Nov-1965 [id number]    [facility] [[facility]] Patient: Daniel Buck  Referral: Insurance Buck  Primary Care Provider:  [name], MD, [facility] provider in an Embedded practice that has a Chronic Care Management program and team and can provide care coordination  *8:52 am Acknowledgement of  hospital admission for STEMI  12:30 pm came to speak with patient noted for transitioning. Came to speak with patient and staff states he just went home.    Buck: Follow up for disposition and Transition of care needs as Sadie Haber is listed to provide TOC calls and follow up. Patient did have new med. Will ask that [facility] RN follow up.  For additional questions or referrals please contact:   Natividad Brood, RN BSN [facility] Liaison  [phone] business mobile phone Toll free office [phone]  Fax number: [phone] [email or web address]

## 2021-09-21 NOTE — Patient Outreach (Signed)
Received a Hospital discharge referral from Natividad Brood, [facility] Liaison. I have assigned Deloria Lair, NP to call for follow up and determine if there are any Case Management needs.    [facility], [place], [place] [facility] [phone]

## 2021-09-21 NOTE — TOC Transition Note (Signed)
Transition of Care [facility]) - CM/SW Discharge Note   Patient Details  Name: Daniel Buck MRN: [id number] Date of Birth: [birth year]  Transition of Care [facility]) CM/SW Contact:  Pollie Friar, RN Phone Number: 09/21/2021, 12:07 PM   Clinical Narrative:    Pt is discharging home with self care. First 30 days of [place] to be delivered to the room per [place] with no co pays. CM provided the patient the co pay card for after the initial 30 days.  Pt denies any issues with medications at home or with transportation.  Pt has a ride home today.   Final next level of care: Home/Self Care Barriers to Discharge: No Barriers Identified   Patient Goals and CMS Choice        Discharge Placement                       Discharge Plan and Services                                     Social Determinants of Health (SDOH) Interventions     Readmission Risk Interventions     No data to display

## 2021-09-21 NOTE — Progress Notes (Signed)
ANTICOAGULATION CONSULT NOTE - Follow Up Consult  Pharmacy Consult for heparin Indication:  ACS  Labs: Recent Labs    09/19/21 0345 09/19/21 0647 09/19/21 2005 09/20/21 0554 09/21/21 0204  HGB 15.0 14.6  --  14.4 14.2  HCT 47.3 45.9  --  46.1 46.1  PLT 254 235  --  241 216  APTT 36  --   --   --   --   LABPROT 13.5  --   --   --   --   INR 1.0  --   --   --   --   HEPARINUNFRC  --   --  0.12* 0.45 0.71*  CREATININE 1.51* 1.38*  --  1.29* 1.46*  TROPONINIHS 4 14  --   --   --     Assessment: 56yo male slightly supratherapeutic on heparin after one level at goal; no infusion issues or signs of bleeding per RN.  Goal of Therapy:  Heparin level 0.3-0.7 units/ml   Plan:  Will decrease heparin infusion by 1 unit/kg/hr to 1450 units/hr and check level in 6 hours.    Wynona Neat, PharmD, BCPS  09/21/2021,3:07 AM

## 2021-09-21 NOTE — Discharge Summary (Addendum)
Discharge Summary    Patient ID: Daniel Buck MRN: [id number]; DOB: 08-11-1965  Admit date: 09/19/2021 Discharge date: 09/21/2021  PCP:  [name], MD   [facility] Providers Cardiologist:  Glenetta Hew, MD      Discharge Diagnoses    Principal Problem:   ACS (acute coronary syndrome) [facility]) Active Problems:   Essential hypertension, benign   Hyperlipidemia LDL goal <70   S/P CABG x 4  Diagnostic Studies/Procedures    Cath: 09/19/21    Ost LM to Mid LM lesion is 60% stenosed.   Dist LM lesion is 60% stenosed.   Prox LAD to Mid LAD lesion is 25% stenosed with 60% stenosed side branch in 1st Diag.   Mid LAD lesion is 80% stenosed.   Dist LAD lesion is 25% stenosed.   Mid Cx lesion is 100% stenosed.   Ost RCA to Prox RCA lesion is 90% stenosed.   Prox RCA to Mid RCA lesion is 80% stenosed.   Origin to Prox Graft lesion is 100% stenosed.   Origin to Dist Graft lesion is 100% stenosed.   Mid RCA to Dist RCA lesion is 100% stenosed.   SVG and is large.   SVG and is normal in caliber.   SVG and is large.   LIMA and is normal in caliber.   The graft exhibits mild .   The graft exhibits no disease.   The graft exhibits no disease.   The flow is not reversed.   There is no competitive flow   LV end diastolic pressure is low.   The left ventricular ejection fraction is 45-50% by visual estimate.   Severe native CAD with 80% ostial and 70% distal left main stenosis.   There is 60% ostial stenosis of the first diagonal vessel with 80% proximal LAD stenosis with competitive filling seen due to a patent LIMA graft.   Total occlusion of the left circumflex vessel after a small first marginal branch.   Severe native proximal RCA with 90% proximal gnosis, diffuse 80% in-stent restenosis of the previously placed bare-metal stent with total occlusion of the mid vessel.   Patent LIMA graft supplying the mid LAD.   Patent SVG supplying the obtuse marginal vessel of the  circumflex with excellent retrograde collateralization to the PDA and distal RCA PLA vessel which is small caliber.   Old occlusion of the vein graft which had supplied the first diagonal vessel of the LAD.   New occlusion of the proximal vein graft supplying the PDA.   Low normal to mild LV dysfunction with EF estimated approximately 45 to 50% with inferior hypocontractility.  EDP 9 mmHg.   RECOMMENDATION: Patient has been maintained on DAPT.  With occlusion while on Plavix consider possible change to ticagrelor.  With the patient having excellent collateralization to the SVG-RCA vein graft territory, the decision was made to treat medically and not risk opening the vein graft with potential distal embolization of clot jeopardizing the recently developed collaterals.  Continue aggressive lipid-lowering therapy.  At present we will treat with IV nitroglycerin.  Consider up titration of Ranexa to 1000 mg twice a day.  Diagnostic Dominance: Right   Echo: 09/19/21  IMPRESSIONS     1. Left ventricular ejection fraction, by estimation, is 45 to 50%. The  left ventricle has mildly decreased function. The left ventricle  demonstrates regional wall motion abnormalities (see scoring  diagram/findings for description). There is mild left  ventricular hypertrophy. Left ventricular diastolic parameters are  indeterminate.   2. The mitral valve is normal in structure. No evidence of mitral valve  regurgitation. No evidence of mitral stenosis.   3. The aortic valve is tricuspid. Aortic valve regurgitation is not  visualized. No aortic stenosis is present.   4. Right ventricular systolic function is moderately reduced. The right  ventricular size is normal. Tricuspid regurgitation signal is inadequate  for assessing PA pressure.   5. The inferior vena cava is normal in size with greater than 50%  respiratory variability, suggesting right atrial pressure of 3 mmHg.   6. Unusual wall motion  abnormality in basal inferior/inferolateral wall.  Appears pseudodyskinesis with outward bulging during systole but diastolic  flattening. This can be seen in setting of external compression such as  with a hiatal hernia, consider CT   chest for further evaluation   FINDINGS   Left Ventricle: Left ventricular ejection fraction, by estimation, is 45  to 50%. The left ventricle has mildly decreased function. The left  ventricle demonstrates regional wall motion abnormalities. The left  ventricular internal cavity size was normal  in size. There is mild left ventricular hypertrophy. Left ventricular  diastolic parameters are indeterminate.      LV Wall Scoring:  The basal inferolateral segment, basal inferior segment, and basal  inferoseptal segment are akinetic. The entire anterior wall,  antero-lateral  wall, mid and distal lateral wall, entire anterior septum, entire apex,  mid  and distal inferior wall, and mid inferoseptal segment are normal.   Right Ventricle: The right ventricular size is normal. Right vetricular  wall thickness was not well visualized. Right ventricular systolic  function is moderately reduced. Tricuspid regurgitation signal is  inadequate for assessing PA pressure.   Left Atrium: Left atrial size was normal in size.   Right Atrium: Right atrial size was normal in size.   Pericardium: There is no evidence of pericardial effusion.   Mitral Valve: The mitral valve is normal in structure. No evidence of  mitral valve regurgitation. No evidence of mitral valve stenosis.   Tricuspid Valve: The tricuspid valve is normal in structure. Tricuspid  valve regurgitation is trivial.   Aortic Valve: The aortic valve is tricuspid. Aortic valve regurgitation is  not visualized. No aortic stenosis is present.   Pulmonic Valve: The pulmonic valve was normal in structure. Pulmonic valve  regurgitation is trivial.   Aorta: The aortic root and ascending aorta are  structurally normal, with  no evidence of dilitation.   Venous: The inferior vena cava is normal in size with greater than 50%  respiratory variability, suggesting right atrial pressure of 3 mmHg.   IAS/Shunts: The interatrial septum was not well visualized.  _____________   History of Present Illness     Angeles C Gorum is a 56 y.o. male with CAD s/p CABG x4 in 2021 (LIMA-LAD, SVG-OM, SVG-Dx, SVG- rpl), cath 4/22: occluded SVG-Diag, other grafts are patent, severe native dx, HTN, HLD, Ulcerative colitis, OSA who was seen 09/19/2021 for the evaluation of chest pain.  Patient reported being woken up by crushing chest pain, substernal radiating to the left arm, associated with some nausea and chocking sensation, 8/10 initially, took 3 nitro at home with only minimal relief, came via EMS- got aspirin. In the ER, repeat EKG shows inferior STE and V2- ST depressions- concerning for possible inferior posterior STEMI. Got aspirin, heparin 4,000 units, nitro x3, and nitro gtt.    Transported to the cath lab for emergent LHC/CA/Graft angiography Patient compliant with meds, no  substance use or smoking.  Hospital Course     Unstable Angina ACS -- Underwent cardiac catheterization noted above with severe native vessel disease, patent LIMA to LAD, SVG to OM, occluded SVG to first diagonal with new occlusion of SVG supplying PDA though has retrograde collateralization from the OM branch.  Given he had excellent collateralization to SVG to RCA territory decision was made to treat medically and not attempt to open vein graft given potential distal embolization of clot jeopardizing collaterals.  Since he had occlusion while on Plavix he was transitioned to DAPT with aspirin/Brilinta.  He was weaned from IV nitroglycerin and IV heparin stopped. --Continue aspirin, Brilinta, Crestor 40 mg daily, Ranexa 500 mg twice daily, Zetia 10 mg daily, Jardiance 17m daily, metoprolol XL 100 mg daily and losartan 25 mg  daily  HTN: stable -- continue Toprol XL 1015mdaily, losartan 2522maily  HLD: LDL 29 -- Continue Crestor 40 mg daily, Zetia 10 mg daily  HFmrEF: Echo showed LVEF of 45 to 50%, mild LVH, severe pseudodyskinesis with outward bulging dyring systole with diastolic flattening (possible hiatal hernia). LVEDP 14 mmHg on cath -- continue  Jardiance 2m61mily, metoprolol XL 100 mg daily and losartan 25 mg daily  Of note patient has been on DHEA and anastrozole PTA for low testosterone levels. Did discuss that he may want to review options with provider given his known CAD. He was agreeable to do so.   General: Well developed, well nourished, male appearing in no acute distress. Head: Normocephalic, atraumatic.  Neck: Supple without bruits, JVD. Lungs:  Resp regular and unlabored, CTA. Heart: RRR, S1, S2, no S3, S4, or murmur; no rub. Abdomen: Soft, non-tender, non-distended with normoactive bowel sounds. No hepatomegaly. No rebound/guarding. No obvious abdominal masses. Extremities: No clubbing, cyanosis, edema. Distal pedal pulses are 2+ bilaterally. Right femoral cath site stable without bruising or hematoma Neuro: Alert and oriented X 3. Moves all extremities spontaneously. Psych: Normal affect.  Patient was seen by Dr. CoopBurt Knack deemed stable for discharge home.  Follow-up arranged in the office.  Medication sent to [facility] [place] [facility] [employer] to [facility].  Did the patient have an acute coronary syndrome (MI, NSTEMI, STEMI, etc) this admission?:  No                               Did the patient have a percutaneous coronary intervention (stent / angioplasty)?:  No.      The patient will be scheduled for a TOC follow up appointment in 10-14 days.  A message has been sent to the TOC [facility] Scheduling Pool at the office where the patient should be seen for follow up.  _____________  Discharge Vitals Blood pressure 116/75, pulse 77, temperature 98.2 F (36.8 C),  temperature source Oral, resp. rate 20, height 5' 11"  (1.803 m), weight 98.2 kg, SpO2 96 %.  Filed Weights   09/19/21 0346 09/20/21 0545  Weight: 97.5 kg 98.2 kg    Labs & Radiologic Studies    CBC Recent Labs    09/20/21 0554 09/21/21 0204  WBC 12.2* 13.4*  HGB 14.4 14.2  HCT 46.1 46.1  MCV 78.8* 79.5*  PLT 241 216 498asic Metabolic Panel Recent Labs    09/20/21 0554 09/21/21 0204  NA 138 139  K 3.8 3.7  CL 105 106  CO2 23 23  GLUCOSE 115* 101*  BUN 14 15  CREATININE  1.29* 1.46*  CALCIUM 8.9 9.1   Liver Function Tests No results for input(s): "AST", "ALT", "ALKPHOS", "BILITOT", "PROT", "ALBUMIN" in the last 72 hours. No results for input(s): "LIPASE", "AMYLASE" in the last 72 hours. High Sensitivity Troponin:   Recent Labs  Lab 09/19/21 0345 09/19/21 0647  TROPONINIHS 4 14    BNP Invalid input(s): "POCBNP" D-Dimer No results for input(s): "DDIMER" in the last 72 hours. Hemoglobin A1C Recent Labs    09/19/21 0345  HGBA1C 5.7*   Fasting Lipid Panel Recent Labs    09/19/21 0345  CHOL 83  HDL 27*  LDLCALC 29  TRIG 136  CHOLHDL 3.1   Thyroid Function Tests No results for input(s): "TSH", "T4TOTAL", "T3FREE", "THYROIDAB" in the last 72 hours.  Invalid input(s): "FREET3" _____________  ECHOCARDIOGRAM COMPLETE  Result Date: 09/19/2021    ECHOCARDIOGRAM REPORT   Patient Name:   FRANDY BASNETT Date of Exam: 09/19/2021 Medical Rec #:  [id number]     Height:       71.0 in Accession #:    [id number]    Weight:       215.0 lb Date of Birth:  22-Sep-1965     BSA:          2.174 m Patient Age:    56 years      BP:           154/79 mmHg Patient Gender: M             HR:           70 bpm. Exam Location:  Inpatient Procedure: 2D Echo, 3D Echo, Cardiac Doppler and Color Doppler Indications:    I25.110 Atherosclerotic heart disease of native coronary artery                 with unstable angina pectoris  History:        Patient has prior history of Echocardiogram  examinations, most                 recent 07/16/2020. CAD and Previous Myocardial Infarction,                 Abnormal ECG, Signs/Symptoms:Chest Pain, Dyspnea and                 Dizziness/Lightheadedness; Risk Factors:Hypertension and                 Dyslipidemia.  Sonographer:    Roseanna Rainbow RDCS Referring Phys: Renae Fickle IMPRESSIONS  1. Left ventricular ejection fraction, by estimation, is 45 to 50%. The left ventricle has mildly decreased function. The left ventricle demonstrates regional wall motion abnormalities (see scoring diagram/findings for description). There is mild left ventricular hypertrophy. Left ventricular diastolic parameters are indeterminate.  2. The mitral valve is normal in structure. No evidence of mitral valve regurgitation. No evidence of mitral stenosis.  3. The aortic valve is tricuspid. Aortic valve regurgitation is not visualized. No aortic stenosis is present.  4. Right ventricular systolic function is moderately reduced. The right ventricular size is normal. Tricuspid regurgitation signal is inadequate for assessing PA pressure.  5. The inferior vena cava is normal in size with greater than 50% respiratory variability, suggesting right atrial pressure of 3 mmHg.  6. Unusual wall motion abnormality in basal inferior/inferolateral wall. Appears pseudodyskinesis with outward bulging during systole but diastolic flattening. This can be seen in setting of external compression such as with a hiatal hernia, consider CT  chest for further evaluation FINDINGS  Left Ventricle: Left ventricular ejection fraction, by estimation, is 45 to 50%. The left ventricle has mildly decreased function. The left ventricle demonstrates regional wall motion abnormalities. The left ventricular internal cavity size was normal in size. There is mild left ventricular hypertrophy. Left ventricular diastolic parameters are indeterminate.  LV Wall Scoring: The basal inferolateral segment, basal inferior segment,  and basal inferoseptal segment are akinetic. The entire anterior wall, antero-lateral wall, mid and distal lateral wall, entire anterior septum, entire apex, mid and distal inferior wall, and mid inferoseptal segment are normal. Right Ventricle: The right ventricular size is normal. Right vetricular wall thickness was not well visualized. Right ventricular systolic function is moderately reduced. Tricuspid regurgitation signal is inadequate for assessing PA pressure. Left Atrium: Left atrial size was normal in size. Right Atrium: Right atrial size was normal in size. Pericardium: There is no evidence of pericardial effusion. Mitral Valve: The mitral valve is normal in structure. No evidence of mitral valve regurgitation. No evidence of mitral valve stenosis. Tricuspid Valve: The tricuspid valve is normal in structure. Tricuspid valve regurgitation is trivial. Aortic Valve: The aortic valve is tricuspid. Aortic valve regurgitation is not visualized. No aortic stenosis is present. Pulmonic Valve: The pulmonic valve was normal in structure. Pulmonic valve regurgitation is trivial. Aorta: The aortic root and ascending aorta are structurally normal, with no evidence of dilitation. Venous: The inferior vena cava is normal in size with greater than 50% respiratory variability, suggesting right atrial pressure of 3 mmHg. IAS/Shunts: The interatrial septum was not well visualized.  LEFT VENTRICLE PLAX 2D LVIDd:         4.90 cm      Diastology LVIDs:         3.80 cm      LV e' medial:    7.23 cm/s LV PW:         1.20 cm      LV E/e' medial:  7.8 LV IVS:        1.20 cm      LV e' lateral:   12.10 cm/s LVOT diam:     2.20 cm      LV E/e' lateral: 4.7 LV SV:         63 LV SV Index:   29 LVOT Area:     3.80 cm                              3D Volume EF: LV Volumes (MOD)            3D EF:        45 % LV vol d, MOD A2C: 108.0 ml LV EDV:       150 ml LV vol d, MOD A4C: 96.5 ml  LV ESV:       82 ml LV vol s, MOD A2C: 55.7 ml  LV SV:         68 ml LV vol s, MOD A4C: 44.6 ml LV SV MOD A2C:     52.3 ml LV SV MOD A4C:     96.5 ml LV SV MOD BP:      53.9 ml RIGHT VENTRICLE            IVC RV S prime:     5.38 cm/s  IVC diam: 1.50 cm TAPSE (M-mode): 0.7 cm LEFT ATRIUM             Index  RIGHT ATRIUM           Index LA diam:        3.40 cm 1.56 cm/m   RA Area:     10.10 cm LA Vol (A2C):   35.7 ml 16.42 ml/m  RA Volume:   20.10 ml  9.24 ml/m LA Vol (A4C):   28.4 ml 13.06 ml/m LA Biplane Vol: 31.8 ml 14.62 ml/m  AORTIC VALVE             PULMONIC VALVE LVOT Vmax:   99.00 cm/s  PR End Diast Vel: 1.47 msec LVOT Vmean:  61.300 cm/s LVOT VTI:    0.167 m  AORTA Ao Root diam: 3.40 cm Ao Asc diam:  3.40 cm MITRAL VALVE MV Area (PHT): 3.37 cm    SHUNTS MV Decel Time: 225 msec    Systemic VTI:  0.17 m MV E velocity: 56.50 cm/s  Systemic Diam: 2.20 cm MV A velocity: 65.70 cm/s MV E/A ratio:  0.86 Oswaldo Milian MD Electronically signed by Oswaldo Milian MD Signature Date/Time: 09/19/2021/11:57:58 AM    Final    CARDIAC CATHETERIZATION  Result Date: 09/19/2021   Ost LM to Mid LM lesion is 60% stenosed.   Dist LM lesion is 60% stenosed.   Prox LAD to Mid LAD lesion is 25% stenosed with 60% stenosed side branch in 1st Diag.   Mid LAD lesion is 80% stenosed.   Dist LAD lesion is 25% stenosed.   Mid Cx lesion is 100% stenosed.   Ost RCA to Prox RCA lesion is 90% stenosed.   Prox RCA to Mid RCA lesion is 80% stenosed.   Origin to Prox Graft lesion is 100% stenosed.   Origin to Dist Graft lesion is 100% stenosed.   Mid RCA to Dist RCA lesion is 100% stenosed.   SVG and is large.   SVG and is normal in caliber.   SVG and is large.   LIMA and is normal in caliber.   The graft exhibits mild .   The graft exhibits no disease.   The graft exhibits no disease.   The flow is not reversed.   There is no competitive flow   LV end diastolic pressure is low.   The left ventricular ejection fraction is 45-50% by visual estimate. Severe native CAD with 80%  ostial and 70% distal left main stenosis. There is 60% ostial stenosis of the first diagonal vessel with 80% proximal LAD stenosis with competitive filling seen due to a patent LIMA graft. Total occlusion of the left circumflex vessel after a small first marginal branch. Severe native proximal RCA with 90% proximal gnosis, diffuse 80% in-stent restenosis of the previously placed bare-metal stent with total occlusion of the mid vessel. Patent LIMA graft supplying the mid LAD. Patent SVG supplying the obtuse marginal vessel of the circumflex with excellent retrograde collateralization to the PDA and distal RCA PLA vessel which is small caliber. Old occlusion of the vein graft which had supplied the first diagonal vessel of the LAD. New occlusion of the proximal vein graft supplying the PDA. Low normal to mild LV dysfunction with EF estimated approximately 45 to 50% with inferior hypocontractility.  EDP 9 mmHg. RECOMMENDATION: Patient has been maintained on DAPT.  With occlusion while on Plavix consider possible change to ticagrelor.  With the patient having excellent collateralization to the SVG-RCA vein graft territory, the decision was made to treat medically and not risk opening the vein graft with potential distal embolization of clot  jeopardizing the recently developed collaterals.  Continue aggressive lipid-lowering therapy.  At present we will treat with IV nitroglycerin.  Consider up titration of Ranexa to 1000 mg twice a day.  DG Chest Portable 1 View  Result Date: 09/19/2021 CLINICAL DATA:  Chest pain. EXAM: PORTABLE CHEST 1 VIEW COMPARISON:  11/12/2019. FINDINGS: The heart size and mediastinal contours are within normal limits. No consolidation, effusion, or pneumothorax. Sternotomy wires are present over the midline. No acute osseous abnormality. IMPRESSION: No active disease. Electronically Signed   By: Brett Fairy M.D.   On: 09/19/2021 04:03    Disposition   Pt is being discharged home today in  good condition.  Follow-up Plans & Appointments     Follow-up Information     Deberah Pelton, NP Follow up on 09/28/2021.   Specialty: Cardiology Why: at 2:20pm for your follow up appt with Dr. Darcus Pester' NP Contact information: [address] [place] [address] [phone]                Discharge Instructions     Call MD for:  difficulty breathing, headache or visual disturbances   Complete by: As directed    Call MD for:  redness, tenderness, or signs of infection (pain, swelling, redness, odor or green/yellow discharge around incision site)   Complete by: As directed    Diet - low sodium heart healthy   Complete by: As directed    Increase activity slowly   Complete by: As directed        Discharge Medications   Allergies as of 09/21/2021       Reactions   Pantoprazole    GI upset        Medication List     STOP taking these medications    amLODipine 2.5 MG tablet Commonly known as: NORVASC   clopidogrel 75 MG tablet Commonly known as: PLAVIX   metoprolol tartrate 50 MG tablet Commonly known as: LOPRESSOR       TAKE these medications    acetaminophen 500 MG tablet Commonly known as: TYLENOL Take 1,000 mg by mouth every 6 (six) hours as needed for moderate pain or headache.   allopurinol 300 MG tablet Commonly known as: ZYLOPRIM Take 1 tablet (300 mg total) by mouth daily. Notes to patient: GOUT   anastrozole 1 MG tablet Commonly known as: ARIMIDEX Take 0.5 tablets (0.5 mg total) by mouth once a week. What changed: when to take this Notes to patient: HORMONE    aspirin EC 81 MG tablet Take 1 tablet (81 mg total) by mouth daily. Swallow whole.   DHEA 25 MG tablet Take 25 mg by mouth daily. Notes to patient: SUPPLEMENT    empagliflozin 10 MG Tabs tablet Commonly known as: JARDIANCE Take 1 tablet (10 mg total) by mouth daily.   esomeprazole 40 MG capsule Commonly known as: NEXIUM Take 1 capsule (40 mg total)  by mouth 2 (two) times daily. Notes to patient: ACID REFLUX PREVENTS STOMACH BLEEDING    ezetimibe 10 MG tablet Commonly known as: ZETIA Take 1 tablet (10 mg total) by mouth daily. What changed: when to take this Notes to patient: LOWERS CHOLESTEROL    fluticasone 50 MCG/ACT nasal spray Commonly known as: FLONASE Place 1 spray into both nostrils daily as needed for allergies or rhinitis.   losartan 25 MG tablet Commonly known as: COZAAR Take 1 tablet (25 mg total) by mouth at bedtime.   mesalamine 1.2 g EC tablet Commonly known as:  LIALDA Take 1.2 g by mouth daily with breakfast. Take 1.2g daily, May take an additional 3 tablets as needed (total of 4 Tablets daily) Notes to patient: ULCERATIVE COLITIS    metoprolol succinate 100 MG 24 hr tablet Commonly known as: TOPROL-XL Take 1 tablet (100 mg total) by mouth daily. Take with or immediately following a meal.   nitroGLYCERIN 0.4 MG SL tablet Commonly known as: NITROSTAT Place 1 tablet (0.4 mg total) under the tongue every 5 (five) minutes as needed.   ondansetron 4 MG tablet Commonly known as: Zofran Take 1 tablet (4 mg total) by mouth every 8 (eight) hours as needed for nausea or vomiting.   ranolazine 500 MG 12 hr tablet Commonly known as: RANEXA Take 1 tablet (500 mg total) by mouth 2 (two) times daily. Notes to patient: DECREASES CHEST PAIN    rosuvastatin 40 MG tablet Commonly known as: CRESTOR Take 1 tablet (40 mg total) by mouth daily. Notes to patient: LOWERS CHOLESTEROL AND TRIGLYCERIDES   tadalafil 5 MG tablet Commonly known as: Cialis Take 1 tablet (5 mg total) by mouth daily as needed for [facility] and ED   ticagrelor 90 MG Tabs tablet Commonly known as: BRILINTA Take 1 tablet (90 mg total) by mouth 2 (two) times daily.   Viibryd 40 MG Tabs Generic drug: Vilazodone HCl Take 1 tablet (40 mg total) by mouth daily with food.   Vitamin B-12 1000 MCG/15ML Liqd Take 1 mL by mouth daily. Notes to patient:  SUPPLEMENT    Vitamin D 125 MCG (5000 UT) Caps Take 5,000 Units by mouth daily. Notes to patient: SUPPLEMENT         Outstanding Labs/Studies   BMET at follow up  Duration of Discharge Encounter   Greater than 30 minutes including physician time.  Signed, Reino Bellis, NP 09/21/2021, 11:32 AM   Patient seen, examined. Available data reviewed. Agree with findings, assessment, and plan as outlined by Reino Bellis, NP.  The patient is independently interviewed and examined.  He is alert, oriented, no distress.  HEENT is normal, lungs are clear bilaterally, heart is regular rate and rhythm with no murmur gallop, abdomen is soft and nontender with no organomegaly, extremities have no edema, skin is warm and dry with no rash.  The patient has had no further anginal symptoms.  He has been transition from clopidogrel to ticagrelor in the setting of another vein graft occlusion.  Interestingly, his cardiac enzymes were negative.  He is on appropriate medical therapy as outlined above.  The patient is medically stable for hospital discharge.  He will follow-up with Dr. Ellyn Hack or his APP in the office.  We had extensive discussion about his weight lifting and transitioning from heavy weight lifting into more aerobic and resistance training.  I advised the patient that he can return to work next week.  Sherren Mocha, M.D. 09/21/2021 11:56 AM

## 2021-09-22 ENCOUNTER — Other Ambulatory Visit (HOSPITAL_COMMUNITY): Payer: Self-pay

## 2021-09-23 ENCOUNTER — Other Ambulatory Visit (HOSPITAL_COMMUNITY): Payer: Self-pay

## 2021-09-25 NOTE — Progress Notes (Signed)
Cardiology Clinic Note   Patient Name: Daniel Buck Date of Encounter: 09/25/2021  Primary Care Provider:  [name], MD Primary Cardiologist:  Glenetta Hew, MD  Patient Profile    Daniel Buck 56 year old male presents to the clinic today for follow-up evaluation of his coronary artery disease and essential hypertension.  Past Medical History    Past Medical History:  Diagnosis Date   Carpal tunnel syndrome on left 12/2011   Chronic nasal congestion    Depression 01/19/2012   GERD (gastroesophageal reflux disease)    Gout    History of Doppler ultrasound    a. Carotid US 3/11: no ICA stenosis    History of echocardiogram    a. Echo 6/14: mod LVH, EF 50-55%, inf-lat HK, Gr 1 DD, mild LAE   History of hiatal hernia    Hx of CABG 09/04/2019   LIMA-LAD, SVG-OM, SVG-Dx, and an SVG-RPLA.   Hyperlipidemia LDL goal < 70    Hypertension    under control; has been on med. since 1996   Multiple vessel coronary artery disease 1996, '02; '21   a. s/p Inf MI (age 56) >>PCI to RCA with tandem BMS (PS 1535); b. LHC 06/2000: pRCA 30-40%, prox/mid stents with 75% ISR, 30-40% at crux in RCA >> PCI: Cutting Balloon atherectomy for ISR; c. 2012 Myoview-nonischemic; d. 07/2019 -cath with progression of disease/LM disease => CABG x4   Myocardial infarct, old 1996   PCI   Sleep apnea    no testing yet   Ulcerative colitis [facility])    Past Surgical History:  Procedure Laterality Date   CAROTID DOPPLERS  02/2020   R ICA 1-39%.  R CCA <50%.  LICA no evidence of stenosis.  Minimal plaque in extracranial vessels.  Normal bilateral vertebral arteries and subclavian arteries.   CARPAL TUNNEL RELEASE  01/26/2012   Procedure: CARPAL TUNNEL RELEASE;  Surgeon: Cammie Sickle., MD;  Location: [place];  Service: Orthopedics;  Laterality: Left;   CORONARY ANGIOPLASTY  07/11/2000   Cutting Balloon PTCA for RCA ISR   CORONARY ARTERY BYPASS GRAFT N/A 09/04/2019   Procedure:  CORONARY ARTERY BYPASS GRAFTING (CABG) using LIMA to LAD; Endoscopic Vein Harvest of Right Greater Saphenous: SVG to Diag1; SVG to OM2; SVG to PL.;  Surgeon: Gaye Pollack, MD;  Location: [place];  Service: Open Heart Surgery;  Laterality: N/A;   CORONARY STENT PLACEMENT  1996   [id number] BMS x 2 in RCA   CYSTECTOMY  as a child   from neck   ENDOVEIN HARVEST OF GREATER SAPHENOUS VEIN Right 09/04/2019   Procedure: [place];  Surgeon: Gaye Pollack, MD;  Location: [place];  Service: Open Heart Surgery;  Laterality: Right;  Endovein scope harvest of right upper and lower LE.   HERNIA REPAIR     LEFT HEART CATH AND CORONARY ANGIOGRAPHY N/A 08/22/2019   Procedure: LEFT HEART CATH AND CORONARY ANGIOGRAPHY;  Surgeon: Belva Crome, MD;  Location: [place] CV LAB;;  Eccentric ostial 40 of 50% LM followed by 55-70% distal LM.  65% midLAD (heavily calcified), ostial D1 80%, LCx 70% between OM1 and OM2, RCA large with diffuse ISR prior PCI in 1996 and 2002-up to 90%.  PDA with competitive flow due to collateral flow from LCA.  EF 50-60%.  LVEDP 17    LEFT HEART CATH AND CORS/GRAFTS ANGIOGRAPHY N/A 07/07/2020   Procedure: LEFT HEART CATH AND CORS/GRAFTS ANGIOGRAPHY;  Surgeon:  [name], MD;  Location: [facility] INVASIVE CV LAB;; New: 100% RCA CTO, 100% CTO p-m Cx after OM1; CTO SVG-D1 (? Culprit). Stable: Ost-pLM 50%, dLM 60%. Ost D1 80%, mid LAD 60% (pre LIMA) -> Patent LIMA-LAD. OM1 patent w/ diffuse mild Dz. Patent SVG-OM2, SVG--RPL1 (no Competitive native flow).  EF ~ 45% w/ Inflat HK. (Rec Echo)   LEFT HEART CATH AND CORS/GRAFTS ANGIOGRAPHY N/A 09/19/2021   Procedure: LEFT HEART CATH AND CORS/GRAFTS ANGIOGRAPHY;  Surgeon: Troy Sine, MD;  Location: [place] CV LAB;  Service: Cardiovascular;  Laterality: N/A;   NM MYOVIEW LTD  02/2011   Inferior infarct but no ischemia   NM MYOVIEW LTD  07/02/2020   EF roughly 40%.  Horizontal ST segment depression in anterolateral  leads.  Also T wave inversions noted.  Medium size, partially reversible apical anterior and apical defect concerning for prior infarct with peri-infarct ischemia.  Intermediate risk.  Global HK with septal HK from prior sternotomy.   TEE WITHOUT CARDIOVERSION N/A 09/04/2019   Procedure: INTRAOPERATIVE TRANSESOPHAGEAL ECHOCARDIOGRAM (TEE);  Surgeon: Gaye Pollack, MD;  Location: [place];  Service: Open Heart Surgery;  Intra-Op TEE: EF 50-55%.  Mild MR normal aortic, pulmonic and tricuspid valves. ->  Stable postop   TONSILLECTOMY  as a child   TRANSTHORACIC ECHOCARDIOGRAM  07/16/2020   EF 55 to 60%.  Paradoxical septal motion c/w Post-OP Sternotomy.  GR 1 DD.  Normal valves.  Normal RV. No change from 09/13/2017   ZIO PATCH EVENT MONITOR  03/2020   Rare PACs and PVCs.  No arrhythmias.  Mostly sinus rhythm rate ranged from 58 to 146 bpm.  Average 81 bpm.    Allergies  Allergies  Allergen Reactions   Pantoprazole     GI upset    History of Present Illness    Daniel Buck has a PMH of HTN, STEMI, CABG x4 6/21 (L-LAD, s-OM, S-Dx, s-PL), memory loss, fatigue, shortness of breath, seasonal allergies.  He underwent cardiac catheterization in 4/22 which showed an occluded SVG-diagonal with his other grafts being patent.  His PMH also includes OSA.  He was seen on 09/19/2021 and evaluated for chest pain.  He described being woken up with crushing chest pain.  This discomfort was substernal and radiated to his left arm.  He also noted nausea and a choking type sensation.  The intensity was 8 out of 10.  He took 3 nitroglycerin at home and noted minimal relief.  He contacted EMS who administered aspirin.  On admission to the emergency department his repeat EKG showed ST elevation in V2 with ST depressions concerning for possible inferior STEMI.  He received heparin, nitro x3 and was initiated on nitroglycerin drip.  He was taken emergently to the cardiac Cath Lab.  He reported compliance with his  medications and denied substance use or smoking.  His cardiac catheterization showed ostial-mid LAD 60% stenosis, distal left main stenosis 60%, proximal LAD-mid LAD 25% stenosis with 60% stenosis in his first diagonal branch, mid LAD 80% stenosis, distal LAD 25% stenosis, mid circumflex 100% stenosis, ostial RCA-proximal RCA 90% stenosis, proximal RCA-mid RCA 80% stenosis, proximal graft 100% stenosis, distal graft 100% stenosis, mid RCA-distal RCA lesion 100% stenosed LVEF 45-50%, 80% ostial and 70% distal left main stenosis.  He was noted to have patent LIMA graft supplying his mid LAD, patent SVG graft supplying his obtuse marginal with excellent retrograde collateralization to his PDA and distal PLA vessel.  Given his occlusion while  on Plavix it was felt that he may be a better candidate for ticagrelor.  Up titration of Ranexa was recommended.  He presents to the clinic today for follow-up evaluation states***  *** denies chest pain, shortness of breath, lower extremity edema, fatigue, palpitations, melena, hematuria, hemoptysis, diaphoresis, weakness, presyncope, syncope, orthopnea, and PND.  Coronary artery disease-denies chest discomfort since leaving/being discharged from the hospital.  Underwent cardiac catheterization which showed severe coronary artery disease with patent LIMA-LAD, SVG-OM, occluded SVG-first diagonal and no occlusion of SVG supplying PDA.  However, he was noted to have retrograde collateralization from the OM branch.  Since his occlusion happened while on Plavix he was transitioned to aspirin and Brilinta. Continue aspirin, Brilinta, rosuvastatin, Ranexa, ezetimibe, Jardiance, metoprolol, losartan Heart healthy low-sodium diet-salty 6 given Increase physical activity as tolerated  Essential hypertension-BP today***.  Well-controlled at home. Continue metoprolol, losartan Heart healthy low-sodium diet-salty 6 given Increase physical activity as  tolerated  Hyperlipidemia-LDL 29 on**** Continue rosuvastatin, ezetimibe Heart healthy low-sodium high-fiber diet Increase physical activity as tolerated  HFmrEF-no increased DOE or activity intolerance.  Echocardiogram showed an LVEF of 45-50%, mild LVH, and severe pseudodyskinesis, LVEDP 14 mmHg on cardiac catheterization Continue Jardiance, metoprolol, losartan Heart healthy low-sodium diet-salty 6 given Increase physical activity as tolerated  Disposition: Follow-up with Dr. Ellyn Hack or me in 3-4 months.  Home Medications    Prior to Admission medications   Medication Sig Start Date End Date Taking? Authorizing Provider  acetaminophen (TYLENOL) 500 MG tablet Take 1,000 mg by mouth every 6 (six) hours as needed for moderate pain or headache.    [provider]  allopurinol (ZYLOPRIM) 300 MG tablet Take 1 tablet (300 mg total) by mouth daily. 08/19/21     anastrozole (ARIMIDEX) 1 MG tablet Take 0.5 tablets (0.5 mg total) by mouth once a week. Patient taking differently: Take 0.5 mg by mouth every Saturday. 06/03/21     aspirin EC 81 MG tablet Take 1 tablet (81 mg total) by mouth daily. Swallow whole. 09/21/21   Troy Sine, MD  Cholecalciferol (VITAMIN D) 125 MCG (5000 UT) CAPS Take 5,000 Units by mouth daily.    [provider]  Cyanocobalamin (VITAMIN B-12) 1000 MCG/15ML LIQD Take 1 mL by mouth daily.    [provider]  DHEA 25 MG tablet Take 25 mg by mouth daily.    [provider]  empagliflozin (JARDIANCE) 10 MG TABS tablet Take 1 tablet (10 mg total) by mouth daily. 09/21/21   Troy Sine, MD  esomeprazole (NEXIUM) 40 MG capsule Take 1 capsule (40 mg total) by mouth 2 (two) times daily. 09/21/21   Troy Sine, MD  ezetimibe (ZETIA) 10 MG tablet Take 1 tablet (10 mg total) by mouth daily. Patient taking differently: Take 10 mg by mouth at bedtime. 07/11/21   [name], MD  fluticasone [facility]) 50 MCG/ACT nasal spray Place 1 spray  into both nostrils daily as needed for allergies or rhinitis.    [provider]  losartan (COZAAR) 25 MG tablet Take 1 tablet (25 mg total) by mouth at bedtime. 09/21/21   Troy Sine, MD  mesalamine (LIALDA) 1.2 g EC tablet Take 1.2 g by mouth daily with breakfast. Take 1.2g daily, May take an additional 3 tablets as needed (total of 4 Tablets daily)    [provider]  metoprolol succinate (TOPROL-XL) 100 MG 24 hr tablet Take 1 tablet (100 mg total) by mouth daily. Take with or immediately following  a meal. 09/21/21   Troy Sine, MD  nitroGLYCERIN (NITROSTAT) 0.4 MG SL tablet Place 1 tablet (0.4 mg total) under the tongue every 5 (five) minutes as needed. 07/05/21   [name], MD  ondansetron (ZOFRAN) 4 MG tablet Take 1 tablet (4 mg total) by mouth every 8 (eight) hours as needed for nausea or vomiting. 07/24/21   Sharion Balloon, FNP  ranolazine (RANEXA) 500 MG 12 hr tablet Take 1 tablet (500 mg total) by mouth 2 (two) times daily. 07/11/21   [name], MD  rosuvastatin (CRESTOR) 40 MG tablet Take 1 tablet (40 mg total) by mouth daily. 10/21/20   [name], MD  tadalafil (CIALIS) 5 MG tablet Take 1 tablet (5 mg total) by mouth daily as needed for [facility] and ED 06/20/21     ticagrelor (BRILINTA) 90 MG TABS tablet Take 1 tablet (90 mg total) by mouth 2 (two) times daily. 09/21/21   Troy Sine, MD  Vilazodone HCl (VIIBRYD) 40 MG TABS Take 1 tablet (40 mg total) by mouth daily with food. 09/15/21       Family History    Family History  Problem Relation Age of Onset   AAA (abdominal aortic aneurysm) Father        Died from ruptured AAA despite surgery   CAD Father 83   Atrial fibrillation Father    Aortic stenosis Mother        Status post aVR   He indicated that his mother is alive. He indicated that his father is deceased.  Social History    Social History   Socioeconomic History   Marital status: Married    Spouse name: Not on file   Number  of children: Not on file   Years of education: Not on file   Highest education level: Not on file  Occupational History   Not on file  Tobacco Use   Smoking status: Former   Smokeless tobacco: Current    Types: Snuff, Chew  Vaping Use   Vaping Use: Never used  Substance and Sexual Activity   Alcohol use: No   Drug use: No   Sexual activity: Not on file  Other Topics Concern   Not on file  Social History Narrative   He lives in [place], [place] near [place] with his wife. [place].  Near the former [address] takes houses his horses.   At baseline, he is extremely active around the farm, but does not have a routine exercise regimen.   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   He quit smoking in 1994.   Social Determinants of Health   Financial Resource Strain: Not on file  Food Insecurity: Not on file  Transportation Needs: Not on file  Physical Activity: Not on file  Stress: Not on file  Social Connections: Not on file  Intimate Partner Violence: Not on file     Review of Systems    General:  No chills, fever, night sweats or weight changes.  Cardiovascular:  No chest pain, dyspnea on exertion, edema, orthopnea, palpitations, paroxysmal nocturnal dyspnea. Dermatological: No rash, lesions/masses Respiratory: No cough, dyspnea Urologic: No hematuria, dysuria Abdominal:   No nausea, vomiting, diarrhea, bright red blood per rectum, melena, or hematemesis Neurologic:  No visual changes, wkns, changes in mental status. All other systems reviewed and are otherwise negative except as noted above.  Physical Exam    VS:  There were no  vitals taken for this visit. , BMI There is no height or weight on file to calculate BMI. GEN: Well nourished, well developed, in no acute distress. HEENT: normal. Neck: Supple, no JVD, carotid bruits, or masses. Cardiac: RRR, no murmurs, rubs, or gallops. No clubbing, cyanosis, edema.  Radials/DP/PT 2+ and equal bilaterally.   Respiratory:  Respirations regular and unlabored, clear to auscultation bilaterally. GI: Soft, nontender, nondistended, BS + x 4. MS: no deformity or atrophy. Skin: warm and dry, no rash. Neuro:  Strength and sensation are intact. Psych: Normal affect.  Accessory Clinical Findings    Recent Labs: 03/24/2021: ALT 36 09/21/2021: BUN 15; Creatinine, Ser 1.46; Hemoglobin 14.2; Platelets 216; Potassium 3.7; Sodium 139   Recent Lipid Panel    Component Value Date/Time   CHOL 83 09/19/2021 0345   CHOL 94 (L) 03/24/2021 0825   TRIG 136 09/19/2021 0345   HDL 27 (L) 09/19/2021 0345   HDL 27 (L) 03/24/2021 0825   CHOLHDL 3.1 09/19/2021 0345   VLDL 27 09/19/2021 0345   LDLCALC 29 09/19/2021 0345   LDLCALC 49 03/24/2021 0825   LDLCALC 86 04/23/2020 0727    ECG personally reviewed by me today- *** - No acute changes  Echocardiogram 09/19/2021  IMPRESSIONS     1. Left ventricular ejection fraction, by estimation, is 45 to 50%. The  left ventricle has mildly decreased function. The left ventricle  demonstrates regional wall motion abnormalities (see scoring  diagram/findings for description). There is mild left  ventricular hypertrophy. Left ventricular diastolic parameters are  indeterminate.   2. The mitral valve is normal in structure. No evidence of mitral valve  regurgitation. No evidence of mitral stenosis.   3. The aortic valve is tricuspid. Aortic valve regurgitation is not  visualized. No aortic stenosis is present.   4. Right ventricular systolic function is moderately reduced. The right  ventricular size is normal. Tricuspid regurgitation signal is inadequate  for assessing PA pressure.   5. The inferior vena cava is normal in size with greater than 50%  respiratory variability, suggesting right atrial pressure of 3 mmHg.   6. Unusual wall motion abnormality in basal inferior/inferolateral wall.  Appears pseudodyskinesis with outward bulging during systole but diastolic   flattening. This can be seen in setting of external compression such as  with a hiatal hernia, consider CT   chest for further evaluation   FINDINGS   Left Ventricle: Left ventricular ejection fraction, by estimation, is 45  to 50%. The left ventricle has mildly decreased function. The left  ventricle demonstrates regional wall motion abnormalities. The left  ventricular internal cavity size was normal  in size. There is mild left ventricular hypertrophy. Left ventricular  diastolic parameters are indeterminate.      LV Wall Scoring:  The basal inferolateral segment, basal inferior segment, and basal  inferoseptal segment are akinetic. The entire anterior wall,  antero-lateral  wall, mid and distal lateral wall, entire anterior septum, entire apex,  mid  and distal inferior wall, and mid inferoseptal segment are normal.   Right Ventricle: The right ventricular size is normal. Right vetricular  wall thickness was not well visualized. Right ventricular systolic  function is moderately reduced. Tricuspid regurgitation signal is  inadequate for assessing PA pressure.   Left Atrium: Left atrial size was normal in size.   Right Atrium: Right atrial size was normal in size.   Pericardium: There is no evidence of pericardial effusion.   Mitral Valve: The mitral valve is normal in  structure. No evidence of  mitral valve regurgitation. No evidence of mitral valve stenosis.   Tricuspid Valve: The tricuspid valve is normal in structure. Tricuspid  valve regurgitation is trivial.   Aortic Valve: The aortic valve is tricuspid. Aortic valve regurgitation is  not visualized. No aortic stenosis is present.   Pulmonic Valve: The pulmonic valve was normal in structure. Pulmonic valve  regurgitation is trivial.   Aorta: The aortic root and ascending aorta are structurally normal, with  no evidence of dilitation.   Venous: The inferior vena cava is normal in size with greater than 50%   respiratory variability, suggesting right atrial pressure of 3 mmHg.   IAS/Shunts: The interatrial septum was not well visualized.  _____________  Cardiac catheterization 09/19/2021    Ost LM to Mid LM lesion is 60% stenosed.   Dist LM lesion is 60% stenosed.   Prox LAD to Mid LAD lesion is 25% stenosed with 60% stenosed side branch in 1st Diag.   Mid LAD lesion is 80% stenosed.   Dist LAD lesion is 25% stenosed.   Mid Cx lesion is 100% stenosed.   Ost RCA to Prox RCA lesion is 90% stenosed.   Prox RCA to Mid RCA lesion is 80% stenosed.   Origin to Prox Graft lesion is 100% stenosed.   Origin to Dist Graft lesion is 100% stenosed.   Mid RCA to Dist RCA lesion is 100% stenosed.   SVG and is large.   SVG and is normal in caliber.   SVG and is large.   LIMA and is normal in caliber.   The graft exhibits mild .   The graft exhibits no disease.   The graft exhibits no disease.   The flow is not reversed.   There is no competitive flow   LV end diastolic pressure is low.   The left ventricular ejection fraction is 45-50% by visual estimate.   Severe native CAD with 80% ostial and 70% distal left main stenosis.   There is 60% ostial stenosis of the first diagonal vessel with 80% proximal LAD stenosis with competitive filling seen due to a patent LIMA graft.   Total occlusion of the left circumflex vessel after a small first marginal branch.   Severe native proximal RCA with 90% proximal gnosis, diffuse 80% in-stent restenosis of the previously placed bare-metal stent with total occlusion of the mid vessel.   Patent LIMA graft supplying the mid LAD.   Patent SVG supplying the obtuse marginal vessel of the circumflex with excellent retrograde collateralization to the PDA and distal RCA PLA vessel which is small caliber.   Old occlusion of the vein graft which had supplied the first diagonal vessel of the LAD.   New occlusion of the proximal vein graft supplying the PDA.   Low  normal to mild LV dysfunction with EF estimated approximately 45 to 50% with inferior hypocontractility.  EDP 9 mmHg.   RECOMMENDATION: Patient has been maintained on DAPT.  With occlusion while on Plavix consider possible change to ticagrelor.  With the patient having excellent collateralization to the SVG-RCA vein graft territory, the decision was made to treat medically and not risk opening the vein graft with potential distal embolization of clot jeopardizing the recently developed collaterals.  Continue aggressive lipid-lowering therapy.  At present we will treat with IV nitroglycerin.  Consider up titration of Ranexa to 1000 mg twice a day.   Diagnostic Dominance: Right    Assessment & Plan   1.  ***  Jossie Ng. Agnieszka Newhouse NP-C    09/25/2021, 1:38 PM [facility] [address] Office [phone] Fax [phone]  Notice: This dictation was prepared with Dragon dictation along with smaller phrase technology. Any transcriptional errors that result from this process are unintentional and may not be corrected upon review.  I spent***minutes examining this patient, reviewing medications, and using patient centered shared decision making involving her cardiac care.  Prior to her visit I spent greater than 20 minutes reviewing her past medical history,  medications, and prior cardiac tests.

## 2021-09-28 ENCOUNTER — Encounter: Payer: Self-pay | Admitting: General Practice

## 2021-09-28 ENCOUNTER — Ambulatory Visit: Payer: 59 | Admitting: General Practice

## 2021-09-28 VITALS — BP 120/80 | HR 71 | Ht 71.0 in | Wt 211.4 lb

## 2021-09-28 DIAGNOSIS — E785 Hyperlipidemia, unspecified: Secondary | ICD-10-CM

## 2021-09-28 DIAGNOSIS — I25119 Atherosclerotic heart disease of native coronary artery with unspecified angina pectoris: Secondary | ICD-10-CM

## 2021-09-28 DIAGNOSIS — I10 Essential (primary) hypertension: Secondary | ICD-10-CM | POA: Diagnosis not present

## 2021-09-28 DIAGNOSIS — I5021 Acute systolic (congestive) heart failure: Secondary | ICD-10-CM | POA: Diagnosis not present

## 2021-09-28 NOTE — Patient Instructions (Signed)
Medication Instructions:  The current medical regimen is effective;  continue present plan and medications as directed. Please refer to the Current Medication list given to you today.   *If you need a refill on your cardiac medications before your next appointment, please call your pharmacy*  Lab Work:    NONE     If you have labs (blood work) drawn today and your tests are completely normal, you will receive your results only by: [place] (if you have MyChart) OR  A paper copy in the mail If you have any lab test that is abnormal or we need to change your treatment, we will call you to review the results.  Testing/Procedures:  Echocardiogram (IN 2 MONTHS/SEPTEMBER)- Your physician has requested that you have an echocardiogram. Echocardiography is a painless test that uses sound waves to create images of your heart. It provides your doctor with information about the size and shape of your heart and how well your heart's chambers and valves are working. This procedure takes approximately one hour. There are no restrictions for this procedure.    Special Instructions SLOWLY INCREASE PHYSICAL ACTIVITY  PLEASE READ AND FOLLOW INCREASED HIGH FIBER DIET  STOP USING CHEWING TOBACCO-USE SUGAR-FREE  GUM  Follow-Up: Your next appointment:  3-4 MONTHS In Person with Glenetta Hew, MD     At [facility], you and your health needs are our priority.  As part of our continuing mission to provide you with exceptional heart care, we have created designated Provider Care Teams.  These Care Teams include your primary Cardiologist (physician) and Advanced Practice Providers (APPs -  Physician Assistants and Nurse Practitioners) who all work together to provide you with the care you need, when you need it.  Important Information About Sugar     High-Fiber Eating Plan Fiber, also called dietary fiber, is a type of carbohydrate. It is found foods such as fruits, vegetables, whole grains, and  beans. A high-fiber diet can have many health benefits. Your health care provider may recommend a high-fiber diet to help: Prevent constipation. Fiber can make your bowel movements more regular. Lower your cholesterol. Relieve the following conditions: Inflammation of veins in the anus (hemorrhoids). Inflammation of specific areas of the digestive tract (uncomplicated diverticulosis). A problem of the large intestine, also called the colon, that sometimes causes pain and diarrhea (irritable bowel syndrome, or IBS). Prevent overeating as part of a weight-loss plan. Prevent heart disease, type 2 diabetes, and certain cancers. What are tips for following this plan? Reading food labels  Check the nutrition facts label on food products for the amount of dietary fiber. Choose foods that have 5 grams of fiber or more per serving. The goals for recommended daily fiber intake include: Men (age 72 or younger): 34-38 g. Men (over age 39): 28-34 g. Women (age 70 or younger): 25-28 g. Women (over age 50): 22-25 g. Your daily fiber goal is _____________ g. Shopping Choose whole fruits and vegetables instead of processed forms, such as apple juice or applesauce. Choose a wide variety of high-fiber foods such as avocados, lentils, oats, and kidney beans. Read the nutrition facts label of the foods you choose. Be aware of foods with added fiber. These foods often have high sugar and sodium amounts per serving. Cooking Use whole-grain flour for baking and cooking. Cook with brown rice instead of white rice. Meal planning Start the day with a breakfast that is high in fiber, such as a cereal that contains 5 g of fiber  or more per serving. Eat breads and cereals that are made with whole-grain flour instead of refined flour or white flour. Eat brown rice, bulgur wheat, or millet instead of white rice. Use beans in place of meat in soups, salads, and pasta dishes. Be sure that half of the grains you eat  each day are whole grains. General information You can get the recommended daily intake of dietary fiber by: Eating a variety of fruits, vegetables, grains, nuts, and beans. Taking a fiber supplement if you are not able to take in enough fiber in your diet. It is better to get fiber through food than from a supplement. Gradually increase how much fiber you consume. If you increase your intake of dietary fiber too quickly, you may have bloating, cramping, or gas. Drink plenty of water to help you digest fiber. Choose high-fiber snacks, such as berries, raw vegetables, nuts, and popcorn. What foods should I eat? Fruits Berries. Pears. Apples. Oranges. Avocado. Prunes and raisins. Dried figs. Vegetables Sweet potatoes. Spinach. Kale. Artichokes. Cabbage. Broccoli. Cauliflower. Green peas. Carrots. Squash. Grains Whole-grain breads. Multigrain cereal. Oats and oatmeal. Brown rice. Barley. Bulgur wheat. [place]. Quinoa. Bran muffins. Popcorn. Rye wafer crackers. Meats and other proteins Navy beans, kidney beans, and pinto beans. Soybeans. Split peas. Lentils. Nuts and seeds. Dairy Fiber-fortified yogurt. Beverages Fiber-fortified soy milk. Fiber-fortified orange juice. Other foods Fiber bars. The items listed above may not be a complete list of recommended foods and beverages. Contact a dietitian for more information. What foods should I avoid? Fruits Fruit juice. Cooked, strained fruit. Vegetables Fried potatoes. Canned vegetables. Well-cooked vegetables. Grains White bread. Pasta made with refined flour. White rice. Meats and other proteins Fatty cuts of meat. Fried chicken or fried fish. Dairy Milk. Yogurt. Cream cheese. Sour cream. Fats and oils Butters. Beverages Soft drinks. Other foods Cakes and pastries. The items listed above may not be a complete list of foods and beverages to avoid. Talk with your dietitian about what choices are best for you. Summary Fiber is a type  of carbohydrate. It is found in foods such as fruits, vegetables, whole grains, and beans. A high-fiber diet has many benefits. It can help to prevent constipation, lower blood cholesterol, aid weight loss, and reduce your risk of heart disease, diabetes, and certain cancers. Increase your intake of fiber gradually. Increasing fiber too quickly may cause cramping, bloating, and gas. Drink plenty of water while you increase the amount of fiber you consume. The best sources of fiber include whole fruits and vegetables, whole grains, nuts, seeds, and beans. This information is not intended to replace advice given to you by your health care provider. Make sure you discuss any questions you have with your health care provider. Document Revised: 07/17/2019 Document Reviewed: 07/17/2019 Elsevier Patient Education  [place].

## 2021-09-29 ENCOUNTER — Other Ambulatory Visit: Payer: Self-pay | Admitting: *Deleted

## 2021-09-29 NOTE — Patient Outreach (Signed)
[facility]) Care Management  09/29/2021  Daniel Buck 06-Aug-1965 [id number]  Initial telephone outreach for a transition of care call. No answer, left a message to return NP call.  [name]. [name], MSN, [facility] Gerontological Nurse Practitioner [facility] Care Management [phone]

## 2021-09-30 ENCOUNTER — Other Ambulatory Visit: Payer: Self-pay | Admitting: *Deleted

## 2021-09-30 NOTE — Patient Outreach (Signed)
[place] [facility]) Care Management  09/30/2021  Daniel Buck 1965-06-06 [id number]  Initial [facility] outreach for transition of care calls. No answer left a second message.  [name]. [name], MSN, [facility] Gerontological Nurse Practitioner [facility] Care Management [phone]

## 2021-10-04 ENCOUNTER — Other Ambulatory Visit: Payer: Self-pay | Admitting: *Deleted

## 2021-10-04 ENCOUNTER — Other Ambulatory Visit (HOSPITAL_COMMUNITY): Payer: Self-pay

## 2021-10-04 ENCOUNTER — Telehealth: Payer: Self-pay | Admitting: General Practice

## 2021-10-04 MED ORDER — ESOMEPRAZOLE MAGNESIUM 40 MG PO CPDR
40.0000 mg | DELAYED_RELEASE_CAPSULE | Freq: Two times a day (BID) | ORAL | 3 refills | Status: DC
  Filled 2021-10-04: qty 180, 90d supply, fill #0
  Filled 2022-02-03 – 2022-02-17 (×2): qty 180, 90d supply, fill #1
  Filled 2022-05-23: qty 180, 90d supply, fill #2
  Filled 2022-05-29 – 2022-08-15 (×2): qty 180, 90d supply, fill #3

## 2021-10-04 NOTE — Patient Outreach (Signed)
[facility]) Care Management  10/04/2021  Daniel Buck 03/10/1966 [id number]  THIRD AND FINAL [facility] TRANSITION OF CARE CALL ATTEMPT. Left a message and requested a return call.  Received call from [name] who reports her husband is doing well and his needs are being met. No further contact necessary at this time.  [name]. [name], MSN, [facility] Gerontological Nurse Practitioner [facility] Care Management [phone]

## 2021-10-04 NOTE — Telephone Encounter (Signed)
   Pt's wife said, Judson Roch called pt and she is returning her call.she said, to call her back because pt does not answer his phone

## 2021-10-04 NOTE — Telephone Encounter (Signed)
This patient chart is not connected to the patient I was trying to reach (it was for his mother Adynn Caseres) Follow notes on her chart.

## 2021-10-06 ENCOUNTER — Other Ambulatory Visit (HOSPITAL_BASED_OUTPATIENT_CLINIC_OR_DEPARTMENT_OTHER): Payer: Self-pay

## 2021-10-06 ENCOUNTER — Telehealth (HOSPITAL_BASED_OUTPATIENT_CLINIC_OR_DEPARTMENT_OTHER): Payer: Self-pay

## 2021-10-06 NOTE — Telephone Encounter (Signed)
Transitions of Care Pharmacy   Call attempted for a pharmacy transitions of care follow-up. HIPAA appropriate voicemail was left with call back information provided.   Call attempt #1. Will follow-up in 2-3 days.    Lind Guest, PharmD Clinical Pharmacy Resident  [facility] 10/06/2021 10:18 AM

## 2021-10-10 ENCOUNTER — Other Ambulatory Visit (HOSPITAL_COMMUNITY): Payer: Self-pay

## 2021-10-17 ENCOUNTER — Other Ambulatory Visit (HOSPITAL_COMMUNITY): Payer: Self-pay

## 2021-10-17 MED ORDER — FLUTICASONE PROPIONATE 50 MCG/ACT NA SUSP
1.0000 | Freq: Every day | NASAL | 1 refills | Status: DC
  Filled 2021-10-17: qty 16, 60d supply, fill #0
  Filled 2022-01-20: qty 16, 60d supply, fill #1

## 2021-10-18 ENCOUNTER — Other Ambulatory Visit (HOSPITAL_COMMUNITY): Payer: Self-pay

## 2021-10-24 ENCOUNTER — Other Ambulatory Visit (HOSPITAL_COMMUNITY): Payer: Self-pay

## 2021-10-26 DIAGNOSIS — L723 Sebaceous cyst: Secondary | ICD-10-CM | POA: Diagnosis not present

## 2021-10-26 DIAGNOSIS — Q279 Congenital malformation of peripheral vascular system, unspecified: Secondary | ICD-10-CM | POA: Diagnosis not present

## 2021-10-31 ENCOUNTER — Other Ambulatory Visit (HOSPITAL_COMMUNITY): Payer: Self-pay

## 2021-11-29 ENCOUNTER — Other Ambulatory Visit (HOSPITAL_COMMUNITY): Payer: Self-pay

## 2021-11-29 DIAGNOSIS — E291 Testicular hypofunction: Secondary | ICD-10-CM | POA: Diagnosis not present

## 2021-11-29 DIAGNOSIS — Z79890 Hormone replacement therapy (postmenopausal): Secondary | ICD-10-CM | POA: Diagnosis not present

## 2021-11-29 DIAGNOSIS — M25511 Pain in right shoulder: Secondary | ICD-10-CM | POA: Diagnosis not present

## 2021-11-29 DIAGNOSIS — R5383 Other fatigue: Secondary | ICD-10-CM | POA: Diagnosis not present

## 2021-12-01 ENCOUNTER — Ambulatory Visit (HOSPITAL_COMMUNITY): Payer: 59 | Attending: Cardiovascular Disease

## 2021-12-01 DIAGNOSIS — M2550 Pain in unspecified joint: Secondary | ICD-10-CM | POA: Diagnosis not present

## 2021-12-01 DIAGNOSIS — I5021 Acute systolic (congestive) heart failure: Secondary | ICD-10-CM

## 2021-12-01 DIAGNOSIS — Z6827 Body mass index (BMI) 27.0-27.9, adult: Secondary | ICD-10-CM | POA: Diagnosis not present

## 2021-12-01 DIAGNOSIS — E291 Testicular hypofunction: Secondary | ICD-10-CM | POA: Diagnosis not present

## 2021-12-01 DIAGNOSIS — R6882 Decreased libido: Secondary | ICD-10-CM | POA: Diagnosis not present

## 2021-12-01 LAB — ECHOCARDIOGRAM COMPLETE
Area-P 1/2: 3.99 cm2
Calc EF: 43.1 %
S' Lateral: 4 cm
Single Plane A2C EF: 46 %
Single Plane A4C EF: 41.5 %

## 2021-12-02 ENCOUNTER — Other Ambulatory Visit: Payer: Self-pay

## 2021-12-02 DIAGNOSIS — I25119 Atherosclerotic heart disease of native coronary artery with unspecified angina pectoris: Secondary | ICD-10-CM

## 2021-12-02 DIAGNOSIS — Z79899 Other long term (current) drug therapy: Secondary | ICD-10-CM

## 2021-12-05 ENCOUNTER — Other Ambulatory Visit (HOSPITAL_COMMUNITY): Payer: Self-pay

## 2021-12-06 ENCOUNTER — Other Ambulatory Visit (HOSPITAL_COMMUNITY): Payer: Self-pay

## 2021-12-06 DIAGNOSIS — I252 Old myocardial infarction: Secondary | ICD-10-CM | POA: Diagnosis not present

## 2021-12-06 DIAGNOSIS — L0390 Cellulitis, unspecified: Secondary | ICD-10-CM | POA: Diagnosis not present

## 2021-12-06 DIAGNOSIS — I2510 Atherosclerotic heart disease of native coronary artery without angina pectoris: Secondary | ICD-10-CM | POA: Diagnosis not present

## 2021-12-06 MED ORDER — SULFAMETHOXAZOLE-TRIMETHOPRIM 800-160 MG PO TABS
1.0000 | ORAL_TABLET | Freq: Two times a day (BID) | ORAL | 0 refills | Status: DC
  Filled 2021-12-06: qty 14, 7d supply, fill #0

## 2021-12-12 ENCOUNTER — Other Ambulatory Visit (HOSPITAL_COMMUNITY): Payer: Self-pay

## 2021-12-12 DIAGNOSIS — M25511 Pain in right shoulder: Secondary | ICD-10-CM | POA: Diagnosis not present

## 2021-12-13 ENCOUNTER — Other Ambulatory Visit (HOSPITAL_COMMUNITY): Payer: Self-pay

## 2021-12-14 ENCOUNTER — Other Ambulatory Visit (HOSPITAL_COMMUNITY): Payer: Self-pay

## 2021-12-14 DIAGNOSIS — M25511 Pain in right shoulder: Secondary | ICD-10-CM | POA: Diagnosis not present

## 2021-12-20 ENCOUNTER — Other Ambulatory Visit (HOSPITAL_COMMUNITY): Payer: Self-pay

## 2021-12-20 MED ORDER — ESOMEPRAZOLE MAGNESIUM 40 MG PO CPDR
40.0000 mg | DELAYED_RELEASE_CAPSULE | Freq: Every day | ORAL | 0 refills | Status: DC
  Filled 2021-12-20: qty 90, 90d supply, fill #0

## 2021-12-28 ENCOUNTER — Other Ambulatory Visit (HOSPITAL_COMMUNITY): Payer: Self-pay

## 2021-12-29 ENCOUNTER — Other Ambulatory Visit (HOSPITAL_COMMUNITY): Payer: Self-pay

## 2021-12-29 DIAGNOSIS — M25511 Pain in right shoulder: Secondary | ICD-10-CM | POA: Diagnosis not present

## 2021-12-31 ENCOUNTER — Other Ambulatory Visit: Payer: Self-pay | Admitting: Cardiology

## 2022-01-02 ENCOUNTER — Other Ambulatory Visit (HOSPITAL_COMMUNITY): Payer: Self-pay

## 2022-01-02 MED ORDER — ROSUVASTATIN CALCIUM 40 MG PO TABS
40.0000 mg | ORAL_TABLET | Freq: Every day | ORAL | 0 refills | Status: DC
  Filled 2022-01-02: qty 30, 30d supply, fill #0

## 2022-01-03 ENCOUNTER — Telehealth: Payer: Self-pay | Admitting: *Deleted

## 2022-01-03 ENCOUNTER — Encounter: Payer: Self-pay | Admitting: Cardiology

## 2022-01-03 ENCOUNTER — Other Ambulatory Visit (HOSPITAL_COMMUNITY): Payer: Self-pay

## 2022-01-03 DIAGNOSIS — S43431D Superior glenoid labrum lesion of right shoulder, subsequent encounter: Secondary | ICD-10-CM | POA: Diagnosis not present

## 2022-01-03 DIAGNOSIS — M25511 Pain in right shoulder: Secondary | ICD-10-CM | POA: Diagnosis not present

## 2022-01-03 DIAGNOSIS — M75121 Complete rotator cuff tear or rupture of right shoulder, not specified as traumatic: Secondary | ICD-10-CM | POA: Diagnosis not present

## 2022-01-03 NOTE — Telephone Encounter (Signed)
   Pre-operative Risk Assessment    Patient Name: Daniel Buck  DOB: Jul 29, 1965 MRN: [id number]      Request for Surgical Clearance    Procedure:   RIGHT SHOULDER SCOPE, A-SAD, MINI OPEN RCR, BICEPS TENODESIS  Date of Surgery:  Clearance TBD                                 Surgeon:  DR. Esmond Plants Surgeon's Group or Practice Name:  [facility] Phone number:  [phone] Fax number:  [phone] ATTN: MEGAN DAVIS   Type of Clearance Requested:   - Medical  - Pharmacy:  Hold Aspirin and Ticagrelor (Brilinta)      Type of Anesthesia:   CHOICE   Additional requests/questions:    Jiles Prows   01/03/2022, 4:01 PM

## 2022-01-03 NOTE — Telephone Encounter (Signed)
Primary Cardiologist:David Ellyn Hack, MD  Chart reviewed as part of pre-operative protocol coverage. Because of Daniel Buck's past medical history and time since last visit, he/she will require a follow-up visit in order to better assess preoperative cardiovascular risk.  Pre-op covering staff: - Patient has an appointment with Dr. Ellyn Hack on 01/30/22. If surgery is urgent, we will need to conduct a virtual visit and get clearance regarding anti-platelet from Dr. Ellyn Hack.  - Please contact requesting surgeon's office via preferred method (i.e, phone, fax) to inform them of need for appointment prior to surgery.  Request to hold antiplatelet therapy will need to be addressed by primary cardiologist due to coronary occlusion while on Plavix seen on [facility] 09/19/21.    Emmaline Life, NP-C  01/03/2022, 4:31 PM [address] Office [phone] Fax [phone]

## 2022-01-04 ENCOUNTER — Other Ambulatory Visit (HOSPITAL_COMMUNITY): Payer: Self-pay

## 2022-01-04 MED ORDER — ROSUVASTATIN CALCIUM 40 MG PO TABS
40.0000 mg | ORAL_TABLET | Freq: Every day | ORAL | 3 refills | Status: DC
  Filled 2022-01-04 – 2022-02-03 (×2): qty 90, 90d supply, fill #0
  Filled 2022-05-05: qty 90, 90d supply, fill #1
  Filled 2022-05-29 – 2022-08-11 (×2): qty 90, 90d supply, fill #2
  Filled 2022-11-11: qty 90, 90d supply, fill #3

## 2022-01-04 NOTE — Telephone Encounter (Signed)
Spoke with pt's wife, [facility] [place], [place] on file.   Pt already scheduled with Dr. Ellyn Hack 01/30/22, they want to keep that.  Pt has started some medications to see if it helps with his shoulder.  If it doesn't and they want to schedule a sooner appointment for surgical clearance, they will call back.  Per wife, they would like to wait til after 01/30/22, but if pt seems to not to be able to, physically, they will call back and move appointment up.  I will route to requesting surgeon's office to make them aware.

## 2022-01-09 ENCOUNTER — Other Ambulatory Visit (HOSPITAL_COMMUNITY): Payer: Self-pay

## 2022-01-11 ENCOUNTER — Encounter: Payer: Self-pay | Admitting: Physician Assistant

## 2022-01-11 ENCOUNTER — Ambulatory Visit: Payer: 59 | Attending: Cardiology | Admitting: Physician Assistant

## 2022-01-11 VITALS — BP 112/78 | HR 74 | Ht 70.0 in | Wt 192.4 lb

## 2022-01-11 DIAGNOSIS — E785 Hyperlipidemia, unspecified: Secondary | ICD-10-CM

## 2022-01-11 DIAGNOSIS — I25810 Atherosclerosis of coronary artery bypass graft(s) without angina pectoris: Secondary | ICD-10-CM

## 2022-01-11 DIAGNOSIS — I10 Essential (primary) hypertension: Secondary | ICD-10-CM

## 2022-01-11 DIAGNOSIS — Z01818 Encounter for other preprocedural examination: Secondary | ICD-10-CM | POA: Diagnosis not present

## 2022-01-11 NOTE — Patient Instructions (Signed)
Medication Instructions:  Your physician recommends that you continue on your current medications as directed. Please refer to the Current Medication list given to you today.  *If you need a refill on your cardiac medications before your next appointment, please call your pharmacy*  Lab Work: NONE ordered at this time of appointment   If you have labs (blood work) drawn today and your tests are completely normal, you will receive your results only by: [place] (if you have MyChart) OR A paper copy in the mail If you have any lab test that is abnormal or we need to change your treatment, we will call you to review the results.  Testing/Procedures: NONE ordered at this time of appointment   Follow-Up: At [facility], you and your health needs are our priority.  As part of our continuing mission to provide you with exceptional heart care, we have created designated Provider Care Teams.  These Care Teams include your primary Cardiologist (physician) and Advanced Practice Providers (APPs -  Physician Assistants and Nurse Practitioners) who all work together to provide you with the care you need, when you need it.   Your next appointment:   As previously scheduled  The format for your next appointment:   In Person  Provider:   Glenetta Hew, MD     Other Instructions  Important Information About Sugar

## 2022-01-11 NOTE — Progress Notes (Signed)
Cardiology Office Note:    Date:  01/11/2022   ID:  Daniel Buck, DOB 1965/10/14, MRN [id number]  PCP:  [name], [place] Prov[facility] Cardiologist:  Glenetta Hew, MD     Referring MD: [name], MD   Chief Complaint  Patient presents with   Follow-up    Seen for Dr. Ellyn Hack    History of Present Illness:    Daniel Buck is a 56 y.o. male with a hx of ulcerative colitis, obstructive sleep apnea, HTN, HLD and CAD s/p CABG x 4 in 6/21 (LIMA-LAD, SVG-OM, SVG-Diag, SVG-PL).  Cardiac catheterization in April 2022 showed occluded SVG to diagonal, other grafts were patent.  He has a history of fatigue, a statin holiday was tried however it did not help with his fatigue.  Adjusting the beta-blocker also did not help.  In fact his palpitation got worse when the beta-blocker was reduced.  He was previously seen by Dr. Ellyn Hack on 09/09/2021 at which point it was suspected some his dyspnea was more seasonal and that he was referred to ENT and allergy/immunology.  More recently, patient was admitted to the hospital on 09/19/2021 with inferior posterior STEMI.  Patient was taken urgently to the Cath Lab.  Cardiac catheterization revealed occluded SVG to RPLV at proximal with good collaterals from OM to the right side.  Compared to the previous films, he had significant disease in proximal and distal SVG-RPLV.  Given the risk of further dislodging the plaque with distal embolization which can occluding the collaterals, he was managed medically.  Since his occlusion occurred while on Plavix, he was transition to aspirin and the Brilinta instead.  Ranexa was added to help control chest discomfort.  EF was 45 to 50% by LV gram.  Echocardiogram obtained on 09/19/2021 showed EF 45 to 50%, wall motion abnormality in the basal inferior/inferolateral wall.  Most recent repeat echocardiogram obtained on 12/01/2021 showed EF 45 to 50%, severe hypokinesis of the LV, basal mid inferior wall,  trivial MR.  Patient presents today for preoperative clearance prior to right shoulder surgery.  He has not had any recent anginal symptom.  His typical angina was substernal chest pain radiating down the left arm.  Prior to the recent shoulder issue, he was able to lift weight on a daily basis.  He clearly cannot accomplish more than 4 METS of activity without any issue.  Given the recent MRI, I did discuss his case with Dr. Glenetta Hew his primary cardiologist.  Since he is doing so well and the surgery is going to be 4 months out from the last MI, he is cleared to proceed from the cardiac perspective.  We do however recommend he continue on aspirin through the procedure.  He can hold Brilinta for 5 days prior to the surgery and restart as soon as possible afterward at the surgeon's discretion.   Past Medical History:  Diagnosis Date   Carpal tunnel syndrome on left 12/2011   Chronic nasal congestion    Depression 01/19/2012   GERD (gastroesophageal reflux disease)    Gout    History of Doppler ultrasound    a. Carotid US 3/11: no ICA stenosis    History of echocardiogram    a. Echo 6/14: mod LVH, EF 50-55%, inf-lat HK, Gr 1 DD, mild LAE   History of hiatal hernia    Hx of CABG 09/04/2019   LIMA-LAD, SVG-OM, SVG-Dx, and an SVG-RPLA.   Hyperlipidemia LDL goal < 70  Hypertension    under control; has been on med. since 1996   Multiple vessel coronary artery disease 1996, '02; '21   a. s/p Inf MI (age 14) >>PCI to RCA with tandem BMS (PS 1535); b. LHC 06/2000: pRCA 30-40%, prox/mid stents with 75% ISR, 30-40% at crux in RCA >> PCI: Cutting Balloon atherectomy for ISR; c. 2012 Myoview-nonischemic; d. 07/2019 -cath with progression of disease/LM disease => CABG x4   Myocardial infarct, old 1996   PCI   Sleep apnea    no testing yet   Ulcerative colitis [facility])     Past Surgical History:  Procedure Laterality Date   CAROTID DOPPLERS  02/2020   R ICA 1-39%.  R CCA <50%.  LICA no evidence  of stenosis.  Minimal plaque in extracranial vessels.  Normal bilateral vertebral arteries and subclavian arteries.   CARPAL TUNNEL RELEASE  01/26/2012   Procedure: CARPAL TUNNEL RELEASE;  Surgeon: Cammie Sickle., MD;  Location: [place];  Service: Orthopedics;  Laterality: Left;   CORONARY ANGIOPLASTY  07/11/2000   Cutting Balloon PTCA for RCA ISR   CORONARY ARTERY BYPASS GRAFT N/A 09/04/2019   Procedure: CORONARY ARTERY BYPASS GRAFTING (CABG) using LIMA to LAD; Endoscopic Vein Harvest of Right Greater Saphenous: SVG to Diag1; SVG to OM2; SVG to PL.;  Surgeon: Gaye Pollack, MD;  Location: [place];  Service: Open Heart Surgery;  Laterality: N/A;   CORONARY STENT PLACEMENT  1996   [id number] BMS x 2 in RCA   CYSTECTOMY  as a child   from neck   ENDOVEIN HARVEST OF GREATER SAPHENOUS VEIN Right 09/04/2019   Procedure: [place];  Surgeon: Gaye Pollack, MD;  Location: [place];  Service: Open Heart Surgery;  Laterality: Right;  Endovein scope harvest of right upper and lower LE.   HERNIA REPAIR     LEFT HEART CATH AND CORONARY ANGIOGRAPHY N/A 08/22/2019   Procedure: LEFT HEART CATH AND CORONARY ANGIOGRAPHY;  Surgeon: Belva Crome, MD;  Location: [place] CV LAB;;  Eccentric ostial 40 of 50% LM followed by 55-70% distal LM.  65% midLAD (heavily calcified), ostial D1 80%, LCx 70% between OM1 and OM2, RCA large with diffuse ISR prior PCI in 1996 and 2002-up to 90%.  PDA with competitive flow due to collateral flow from LCA.  EF 50-60%.  LVEDP 17    LEFT HEART CATH AND CORS/GRAFTS ANGIOGRAPHY N/A 07/07/2020   Procedure: LEFT HEART CATH AND CORS/GRAFTS ANGIOGRAPHY;  Surgeon: [name], MD;  Location: [place] CV LAB;; New: 100% RCA CTO, 100% CTO p-m Cx after OM1; CTO SVG-D1 (? Culprit). Stable: Ost-pLM 50%, dLM 60%. Ost D1 80%, mid LAD 60% (pre LIMA) -> Patent LIMA-LAD. OM1 patent w/ diffuse mild Dz. Patent SVG-OM2, SVG--RPL1 (no Competitive  native flow).  EF ~ 45% w/ Inflat HK. (Rec Echo)   LEFT HEART CATH AND CORS/GRAFTS ANGIOGRAPHY N/A 09/19/2021   Procedure: LEFT HEART CATH AND CORS/GRAFTS ANGIOGRAPHY;  Surgeon: Troy Sine, MD;  Location: [place] CV LAB;  Service: Cardiovascular;  Laterality: N/A;   NM MYOVIEW LTD  02/2011   Inferior infarct but no ischemia   NM MYOVIEW LTD  07/02/2020   EF roughly 40%.  Horizontal ST segment depression in anterolateral leads.  Also T wave inversions noted.  Medium size, partially reversible apical anterior and apical defect concerning for prior infarct with peri-infarct ischemia.  Intermediate risk.  Global HK with septal HK from  prior sternotomy.   TEE WITHOUT CARDIOVERSION N/A 09/04/2019   Procedure: INTRAOPERATIVE TRANSESOPHAGEAL ECHOCARDIOGRAM (TEE);  Surgeon: Gaye Pollack, MD;  Location: [place];  Service: Open Heart Surgery;  Intra-Op TEE: EF 50-55%.  Mild MR normal aortic, pulmonic and tricuspid valves. ->  Stable postop   TONSILLECTOMY  as a child   TRANSTHORACIC ECHOCARDIOGRAM  07/16/2020   EF 55 to 60%.  Paradoxical septal motion c/w Post-OP Sternotomy.  GR 1 DD.  Normal valves.  Normal RV. No change from 09/13/2017   ZIO PATCH EVENT MONITOR  03/2020   Rare PACs and PVCs.  No arrhythmias.  Mostly sinus rhythm rate ranged from 58 to 146 bpm.  Average 81 bpm.    Current Medications: Current Meds  Medication Sig   acetaminophen (TYLENOL) 500 MG tablet Take 1,000 mg by mouth every 6 (six) hours as needed for moderate pain or headache.   allopurinol (ZYLOPRIM) 300 MG tablet Take 1 tablet (300 mg total) by mouth daily.   aspirin EC 81 MG tablet Take 1 tablet (81 mg total) by mouth daily. Swallow whole.   Cholecalciferol (VITAMIN D) 125 MCG (5000 UT) CAPS Take 5,000 Units by mouth daily.   Cyanocobalamin (VITAMIN B-12) 1000 MCG/15ML LIQD Take 1 mL by mouth daily.   DHEA 25 MG tablet Take 25 mg by mouth daily.   empagliflozin (JARDIANCE) 10 MG TABS tablet Take 1 tablet (10 mg  total) by mouth daily.   esomeprazole (NEXIUM) 40 MG capsule Take 1 capsule (40 mg total) by mouth 2 (two) times daily.   ezetimibe (ZETIA) 10 MG tablet Take 1 tablet (10 mg total) by mouth daily. (Patient taking differently: Take 10 mg by mouth at bedtime.)   fluticasone (FLONASE) 50 MCG/ACT nasal spray Place 1 spray into both nostrils daily   losartan (COZAAR) 25 MG tablet Take 1 tablet (25 mg total) by mouth at bedtime.   mesalamine (LIALDA) 1.2 g EC tablet Take 1.2 g by mouth daily with breakfast. Take 1.2g daily, May take an additional 3 tablets as needed (total of 4 Tablets daily)   methocarbamol (ROBAXIN) 500 MG tablet Take 1 tablet every 6-8 hours by oral route as needed for spasm.   metoprolol succinate (TOPROL-XL) 100 MG 24 hr tablet Take 1 tablet (100 mg total) by mouth daily. Take with or immediately following a meal.   nitroGLYCERIN (NITROSTAT) 0.4 MG SL tablet Place 1 tablet (0.4 mg total) under the tongue every 5 (five) minutes as needed.   ondansetron (ZOFRAN) 4 MG tablet Take 1 tablet (4 mg total) by mouth every 8 (eight) hours as needed for nausea or vomiting.   ranolazine (RANEXA) 500 MG 12 hr tablet Take 1 tablet (500 mg total) by mouth 2 (two) times daily.   rosuvastatin (CRESTOR) 40 MG tablet Take 1 tablet (40 mg total) by mouth daily.   tadalafil (CIALIS) 5 MG tablet Take 1 tablet (5 mg total) by mouth daily as needed for [facility] and ED   ticagrelor (BRILINTA) 90 MG TABS tablet Take 1 tablet (90 mg total) by mouth 2 (two) times daily.   Vilazodone HCl (VIIBRYD) 40 MG TABS Take 1 tablet (40 mg total) by mouth daily with food.     Allergies:   Pantoprazole   Social History   Socioeconomic History   Marital status: Married    Spouse name: Not on file   Number of children: Not on file   Years of education: Not on file   Highest education level: Not  on file  Occupational History   Not on file  Tobacco Use   Smoking status: Former   Smokeless tobacco: Current    Types:  Snuff, Chew  Vaping Use   Vaping Use: Never used  Substance and Sexual Activity   Alcohol use: No   Drug use: No   Sexual activity: Not on file  Other Topics Concern   Not on file  Social History Narrative   He lives in [place], [place] near [place] with his wife. [place].  Near the former [address] Roll[address] takes houses his horses.   At baseline, he is extremely active around the farm, but does not have a routine exercise regimen.   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   He quit smoking in 1994.   Social Determinants of Health   Financial Resource Strain: Not on file  Food Insecurity: Not on file  Transportation Needs: Not on file  Physical Activity: Not on file  Stress: Not on file  Social Connections: Not on file     Family History: The patient's family history includes AAA (abdominal aortic aneurysm) in his father; Aortic stenosis in his mother; Atrial fibrillation in his father; CAD (age of onset: 75) in his father.  ROS:   Please see the history of present illness.     All other systems reviewed and are negative.  EKGs/Labs/Other Studies Reviewed:    The following studies were reviewed today:  Echo 12/01/2021 1. Left ventricular ejection fraction, by estimation, is 45 to 50%. Left  ventricular ejection fraction by 3D volume is 45 %. The left ventricle has  mildly decreased function. The left ventricle demonstrates regional wall  motion abnormalities (see  scoring diagram/findings for description). Left ventricular diastolic  parameters were normal. There is severe hypokinesis of the left  ventricular, basal-mid inferior wall.   2. Right ventricular systolic function is mildly reduced. The right  ventricular size is normal. Tricuspid regurgitation signal is inadequate  for assessing PA pressure.   3. Left atrial size was mildly dilated.   4. The mitral valve is normal in structure. Trivial mitral valve  regurgitation.   5. The aortic valve is  tricuspid. Aortic valve regurgitation is not  visualized. No aortic stenosis is present.   6. There is mild dilatation of the ascending aorta, measuring 39 mm.   7. The inferior vena cava is dilated in size with >50% respiratory  variability, suggesting right atrial pressure of 8 mmHg.   Comparison(s): The left ventricular function is worsened. The left  ventricular wall motion abnormality is new.     EKG:  EKG is ordered today.  The ekg ordered today demonstrates normal sinus rhythm, Q waves in the inferior lead  Recent Labs: 03/24/2021: ALT 36 09/21/2021: BUN 15; Creatinine, Ser 1.46; Hemoglobin 14.2; Platelets 216; Potassium 3.7; Sodium 139  Recent Lipid Panel    Component Value Date/Time   CHOL 83 09/19/2021 0345   CHOL 94 (L) 03/24/2021 0825   TRIG 136 09/19/2021 0345   HDL 27 (L) 09/19/2021 0345   HDL 27 (L) 03/24/2021 0825   CHOLHDL 3.1 09/19/2021 0345   VLDL 27 09/19/2021 0345   LDLCALC 29 09/19/2021 0345   LDLCALC 49 03/24/2021 0825   LDLCALC 86 04/23/2020 0727     Risk Assessment/Calculations:           Physical Exam:    VS:  BP 112/78 (BP Location: Left Arm, Patient Position: Sitting, Cuff Size: Normal)  Pulse 74   Ht 5' 10"  (1.778 m)   Wt 192 lb 6.4 oz (87.3 kg)   SpO2 98%   BMI 27.61 kg/m         Wt Readings from Last 3 Encounters:  01/11/22 192 lb 6.4 oz (87.3 kg)  09/28/21 211 lb 6.4 oz (95.9 kg)  09/20/21 216 lb 8 oz (98.2 kg)     GEN:  Well nourished, well developed in no acute distress HEENT: Normal NECK: No JVD; No carotid bruits LYMPHATICS: No lymphadenopathy CARDIAC: RRR, no murmurs, rubs, gallops RESPIRATORY:  Clear to auscultation without rales, wheezing or rhonchi  ABDOMEN: Soft, non-tender, non-distended MUSCULOSKELETAL:  No edema; No deformity  SKIN: Warm and dry NEUROLOGIC:  Alert and oriented x 3 PSYCHIATRIC:  Normal affect   ASSESSMENT:    1. Preop examination   2. Coronary artery disease involving coronary bypass  graft of native heart without angina pectoris   3. Essential hypertension, benign   4. Hyperlipidemia LDL goal <70    PLAN:    In order of problems listed above:  Preoperative clearance: Patient has upcoming right shoulder surgery by Dr. Esmond Plants of [facility].  He has no problem achieving more than than 4 METS of activity.   Unfortunately patient did have a inferior posterior STEMI in June and found to have occluded SVG-RPLV, medical therapy was recommended to avoid distal embolization which can occlude the distal collaterals.  Since the MI occurred while he was on Plavix, he has been switched to aspirin and Brilinta instead.  He has been doing well in the past several months without exertional chest pain radiating down the left arm which was his previous anginal symptom.  I discussed case with his primary cardiologist Dr. Ellyn Hack, we felt that since the surgery will be 4 months out from the last MI, he should be at acceptable risk to proceed from the cardiac perspective.  We do prefer him to stay on aspirin through the surgery given his cardiac history, although he cannot hold Brilinta for 5 days prior to the surgery and restart as soon as possible afterward at the surgeon's discretion.  CAD: History of CABG x4 in June 2021 with LIMA to LAD, SVG to OM, SVG to diagonal and SVG to PL.  Patient was noted to have occluded SVG to diagonal in April 2022.  He was found to have another occluded SVG to PL in 2023.  Previous Plavix has been switched to aspirin and Brilinta.  We will need to hold Brilinta prior to upcoming shoulder surgery, however prefer to continue on aspirin through the surgery.  Hypertension: Blood pressure well controlled  Hyperlipidemia: On Zetia and Crestor.  Cholesterol appears to be very well controlled in June despite recent MI.        Medication Adjustments/Labs and Tests Ordered: Current medicines are reviewed at length with the patient today.  Concerns regarding  medicines are outlined above.  Orders Placed This Encounter  Procedures   EKG 12-Lead   No orders of the defined types were placed in this encounter.   Patient Instructions  Medication Instructions:  Your physician recommends that you continue on your current medications as directed. Please refer to the Current Medication list given to you today.  *If you need a refill on your cardiac medications before your next appointment, please call your pharmacy*  Lab Work: NONE ordered at this time of appointment   If you have labs (blood work) drawn today and your tests are completely  normal, you will receive your results only by: MyChart Message (if you have MyChart) OR A paper copy in the mail If you have any lab test that is abnormal or we need to change your treatment, we will call you to review the results.  Testing/Procedures: NONE ordered at this time of appointment   Follow-Up: At [facility], you and your health needs are our priority.  As part of our continuing mission to provide you with exceptional heart care, we have created designated Provider Care Teams.  These Care Teams include your primary Cardiologist (physician) and Advanced Practice Providers (APPs -  Physician Assistants and Nurse Practitioners) who all work together to provide you with the care you need, when you need it.   Your next appointment:   As previously scheduled  The format for your next appointment:   In Person  Provider:   Glenetta Hew, MD     Other Instructions  Important Information About Sugar         Signed, Almyra Deforest, [place]  01/11/2022 10:25 AM    [place]

## 2022-01-18 ENCOUNTER — Other Ambulatory Visit (HOSPITAL_COMMUNITY): Payer: Self-pay

## 2022-01-20 ENCOUNTER — Other Ambulatory Visit (HOSPITAL_COMMUNITY): Payer: Self-pay

## 2022-01-20 ENCOUNTER — Encounter (HOSPITAL_COMMUNITY): Payer: Self-pay | Admitting: Orthopedic Surgery

## 2022-01-20 NOTE — Progress Notes (Signed)
Please place orders in epic.

## 2022-01-20 NOTE — Progress Notes (Addendum)
PCP - [name] ,MD Cardiologist - Clearance 01-11-22 epic Almyra Deforest, [place]  PPM/ICD -  Device Orders -  Rep Notified -   Chest x-ray - 09-19-21 epic EKG - 01-11-22 epic Stress Test -  ECHO - 12-01-21 epic Cardiac Cath -09-19-21 epic   Sleep Study -  CPAP -   Fasting Blood Sugar -  Checks Blood Sugar _____ times a day  Blood Thinner Instructions:Brillinta hold 5 days Aspirin Instructions: continue  ERAS Protcol - PRE-SURGERY Ensure or G2-    COVID vaccine -  Activity-- Anesthesia review: CAD,CABGx4 2021, OSA, MI, HTN  Patient denies shortness of breath, fever, cough and chest pain at PAT appointment   All instructions explained to the patient, with a verbal understanding of the material. Patient agrees to go over the instructions while at home for a better understanding. Patient also instructed to self quarantine after being tested for COVID-19. The opportunity to ask questions was provided.

## 2022-01-23 ENCOUNTER — Other Ambulatory Visit (HOSPITAL_COMMUNITY): Payer: Self-pay

## 2022-01-24 ENCOUNTER — Other Ambulatory Visit: Payer: Self-pay

## 2022-01-24 ENCOUNTER — Encounter (HOSPITAL_COMMUNITY): Payer: Self-pay

## 2022-01-24 ENCOUNTER — Encounter (HOSPITAL_COMMUNITY)
Admission: RE | Admit: 2022-01-24 | Discharge: 2022-01-24 | Disposition: A | Discharge status: Home / Self Care | Payer: 59 | Source: Ambulatory Visit | Attending: Orthopedic Surgery | Admitting: Orthopedic Surgery

## 2022-01-24 ENCOUNTER — Other Ambulatory Visit (HOSPITAL_COMMUNITY): Payer: Self-pay

## 2022-01-24 DIAGNOSIS — R5383 Other fatigue: Secondary | ICD-10-CM | POA: Diagnosis not present

## 2022-01-24 DIAGNOSIS — Z79890 Hormone replacement therapy (postmenopausal): Secondary | ICD-10-CM | POA: Diagnosis not present

## 2022-01-24 DIAGNOSIS — E291 Testicular hypofunction: Secondary | ICD-10-CM | POA: Diagnosis not present

## 2022-01-24 NOTE — Progress Notes (Addendum)
PCP - [name] ,MD Cardiologist - Clearance 01-11-22 epic Almyra Deforest, [place]  PPM/ICD -  Device Orders -  Rep Notified -   Chest x-ray - 09-19-21 epic EKG - 01-11-22 epic Stress Test -  ECHO - 12-01-21 epic Cardiac Cath -09-19-21 epic   Sleep Study -  CPAP -   Fasting Blood Sugar -  Checks Blood Sugar _____ times a day  Blood Thinner Instructions:Brillinta hold 5 days Aspirin Instructions: continue   ERAS Protcol - PRE-SURGERY Ensure or G2-    COVID vaccine -  Activity--Able to walk a flight of stairs without SOB Anesthesia review: CAD,CABGx4 2021, OSA, MI, HTN  Patient denies shortness of breath, fever, cough and chest pain at PAT appointment   All instructions explained to the patient, with a verbal understanding of the material. Patient agrees to go over the instructions while at home for a better understanding. Patient also instructed to self quarantine after being tested for COVID-19. The opportunity to ask questions was provided.

## 2022-01-24 NOTE — H&P (Signed)
[name] anticipated LOS is less than 2 midnights, meeting these requirements: - Younger than 7 - Lives within 1 hour of care - Has a competent adult at home to recover with post-op recover - NO history of  - Chronic pain requiring opiods  - Diabetes  - Coronary Artery Disease  - Heart failure  - Heart attack  - Stroke  - DVT/VTE  - Cardiac arrhythmia  - Respiratory Failure/COPD  - Renal failure  - Anemia  - Advanced Liver disease     Daniel Buck is an 56 y.o. male.    Chief Complaint: right shoulder pain  HPI: Pt is a 56 y.o. male complaining of right shoulder pain for multiple years. Pain had continually increased since the beginning. X-rays in the clinic show rotator cuff tear right shoulder. Pt has tried various conservative treatments which have failed to alleviate their symptoms, including injections and therapy. Various options are discussed with the patient. Risks, benefits and expectations were discussed with the patient. Patient understand the risks, benefits and expectations and wishes to proceed with surgery.   PCP:  [name], MD  D/C Plans: Home  PMH: Past Medical History:  Diagnosis Date   Carpal tunnel syndrome on left 12/2011   Chronic nasal congestion    Depression 01/19/2012   GERD (gastroesophageal reflux disease)    Gout    History of Doppler ultrasound    a. Carotid US 3/11: no ICA stenosis    History of echocardiogram    a. Echo 6/14: mod LVH, EF 50-55%, inf-lat HK, Gr 1 DD, mild LAE   History of hiatal hernia    Hx of CABG 09/04/2019   LIMA-LAD, SVG-OM, SVG-Dx, and an SVG-RPLA.   Hyperlipidemia LDL goal < 70    Hypertension    under control; has been on med. since 1996   Multiple vessel coronary artery disease 1996, '02; '21   a. s/p Inf MI (age 59) >>PCI to RCA with tandem BMS (PS 1535); b. LHC 06/2000: pRCA 30-40%, prox/mid stents with 75% ISR, 30-40% at crux in RCA >> PCI: Cutting Balloon atherectomy for ISR; c. 2012  Myoview-nonischemic; d. 07/2019 -cath with progression of disease/LM disease => CABG x4   Myocardial infarct, old 1996   PCI   and 2021 and 6/ 2023   Sleep apnea    mild no cpap   Ulcerative colitis ([place])     PSH: Past Surgical History:  Procedure Laterality Date   CAROTID DOPPLERS  02/2020   R ICA 1-39%.  R CCA <50%.  LICA no evidence of stenosis.  Minimal plaque in extracranial vessels.  Normal bilateral vertebral arteries and subclavian arteries.   CARPAL TUNNEL RELEASE  01/26/2012   Procedure: CARPAL TUNNEL RELEASE;  Surgeon: Cammie Sickle., MD;  Location: [place];  Service: Orthopedics;  Laterality: Left;   CORONARY ANGIOPLASTY  07/11/2000   Cutting Balloon PTCA for RCA ISR   CORONARY ARTERY BYPASS GRAFT N/A 09/04/2019   Procedure: CORONARY ARTERY BYPASS GRAFTING (CABG) using LIMA to LAD; Endoscopic Vein Harvest of Right Greater Saphenous: SVG to Diag1; SVG to OM2; SVG to PL.;  Surgeon: Gaye Pollack, MD;  Location: [place];  Service: Open Heart Surgery;  Laterality: N/A;   CORONARY STENT PLACEMENT  1996   [id number] BMS x 2 in RCA   CYSTECTOMY  as a child   from neck   ENDOVEIN HARVEST [place] Right 09/04/2019   Procedure: [place]  VEIN;  Surgeon: Gaye Pollack, MD;  Location: [facility] OR;  Service: Open Heart Surgery;  Laterality: Right;  Endovein scope harvest of right upper and lower LE.   HERNIA REPAIR     LEFT HEART CATH AND CORONARY ANGIOGRAPHY N/A 08/22/2019   Procedure: LEFT HEART CATH AND CORONARY ANGIOGRAPHY;  Surgeon: Belva Crome, MD;  Location: [place] CV LAB;;  Eccentric ostial 40 of 50% LM followed by 55-70% distal LM.  65% midLAD (heavily calcified), ostial D1 80%, LCx 70% between OM1 and OM2, RCA large with diffuse ISR prior PCI in 1996 and 2002-up to 90%.  PDA with competitive flow due to collateral flow from LCA.  EF 50-60%.  LVEDP 17    LEFT HEART CATH AND CORS/GRAFTS ANGIOGRAPHY N/A 07/07/2020    Procedure: LEFT HEART CATH AND CORS/GRAFTS ANGIOGRAPHY;  Surgeon: [name], MD;  Location: [place] CV LAB;; New: 100% RCA CTO, 100% CTO p-m Cx after OM1; CTO SVG-D1 (? Culprit). Stable: Ost-pLM 50%, dLM 60%. Ost D1 80%, mid LAD 60% (pre LIMA) -> Patent LIMA-LAD. OM1 patent w/ diffuse mild Dz. Patent SVG-OM2, SVG--RPL1 (no Competitive native flow).  EF ~ 45% w/ Inflat HK. (Rec Echo)   LEFT HEART CATH AND CORS/GRAFTS ANGIOGRAPHY N/A 09/19/2021   Procedure: LEFT HEART CATH AND CORS/GRAFTS ANGIOGRAPHY;  Surgeon: Troy Sine, MD;  Location: [place] [facility] LAB;  Service: Cardiovascular;  Laterality: N/A;   NM MYOVIEW LTD  02/2011   Inferior infarct but no ischemia   NM MYOVIEW LTD  07/02/2020   EF roughly 40%.  Horizontal ST segment depression in anterolateral leads.  Also T wave inversions noted.  Medium size, partially reversible apical anterior and apical defect concerning for prior infarct with peri-infarct ischemia.  Intermediate risk.  Global HK with septal HK from prior sternotomy.   TEE WITHOUT CARDIOVERSION N/A 09/04/2019   Procedure: INTRAOPERATIVE TRANSESOPHAGEAL ECHOCARDIOGRAM (TEE);  Surgeon: Gaye Pollack, MD;  Location: [place];  Service: Open Heart Surgery;  Intra-Op TEE: EF 50-55%.  Mild MR normal aortic, pulmonic and tricuspid valves. ->  Stable postop   TONSILLECTOMY  as a child   TRANSTHORACIC ECHOCARDIOGRAM  07/16/2020   EF 55 to 60%.  Paradoxical septal motion c/w Post-OP Sternotomy.  GR 1 DD.  Normal valves.  Normal RV. No change from 09/13/2017   ZIO PATCH EVENT MONITOR  03/2020   Rare PACs and PVCs.  No arrhythmias.  Mostly sinus rhythm rate ranged from 58 to 146 bpm.  Average 81 bpm.    Social History:  reports that he quit smoking about 29 years ago. His smoking use included cigarettes. His smokeless tobacco use includes snuff. He reports that he does not drink alcohol and does not use drugs. BMI: Estimated body mass index is 27.61 kg/m as calculated from the  following:   Height as of 01/11/22: 5' 10"  (1.778 m).   Weight as of 01/11/22: 87.3 kg.  Lab Results  Component Value Date   ALBUMIN 4.9 03/24/2021   Diabetes: Patient does not have a diagnosis of diabetes. Lab Results  Component Value Date   HGBA1C 5.7 (H) 09/19/2021     Smoking Status:      Allergies:  Allergies  Allergen Reactions   Pantoprazole Other (See Comments)    GI upset    Medications: No current facility-administered medications for this encounter.   Current Outpatient Medications  Medication Sig Dispense Refill   acetaminophen (TYLENOL) 500 MG tablet Take 1,000 mg by mouth every  6 (six) hours as needed for moderate pain or headache.     allopurinol (ZYLOPRIM) 300 MG tablet Take 1 tablet (300 mg total) by mouth daily. 90 tablet 3   aspirin EC 81 MG tablet Take 1 tablet (81 mg total) by mouth daily. Swallow whole. 90 tablet 3   Cholecalciferol (VITAMIN D) 125 MCG (5000 UT) CAPS Take 5,000 Units by mouth daily.     Cyanocobalamin (VITAMIN B-12) 1000 MCG/15ML LIQD Take 15 mLs by mouth daily.     DHEA 25 MG tablet Take 25 mg by mouth daily.     empagliflozin (JARDIANCE) 10 MG TABS tablet Take 1 tablet (10 mg total) by mouth daily. 90 tablet 3   esomeprazole (NEXIUM) 40 MG capsule Take 1 capsule (40 mg total) by mouth 2 (two) times daily. 180 capsule 3   ezetimibe (ZETIA) 10 MG tablet Take 1 tablet (10 mg total) by mouth daily. (Patient taking differently: Take 10 mg by mouth at bedtime.) 90 tablet 3   fluticasone (FLONASE) 50 MCG/ACT nasal spray Place 1 spray into both nostrils daily 16 g 1   losartan (COZAAR) 25 MG tablet Take 1 tablet (25 mg total) by mouth at bedtime. 30 tablet 11   mesalamine (LIALDA) 1.2 g EC tablet Take 1.2 g by mouth daily with breakfast. Take 1.2g daily, May take an additional 3 tablets as needed (total of 4 Tablets daily)     methocarbamol (ROBAXIN) 500 MG tablet Take 500 mg by mouth daily as needed for muscle spasms.     metoprolol  succinate (TOPROL-XL) 100 MG 24 hr tablet Take 1 tablet (100 mg total) by mouth daily. Take with or immediately following a meal. 90 tablet 3   nitroGLYCERIN (NITROSTAT) 0.4 MG SL tablet Place 1 tablet (0.4 mg total) under the tongue every 5 (five) minutes as needed. 25 tablet 6   ondansetron (ZOFRAN) 4 MG tablet Take 1 tablet (4 mg total) by mouth every 8 (eight) hours as needed for nausea or vomiting. 20 tablet 0   ranolazine (RANEXA) 500 MG 12 hr tablet Take 1 tablet (500 mg total) by mouth 2 (two) times daily. 180 tablet 3   rosuvastatin (CRESTOR) 40 MG tablet Take 1 tablet (40 mg total) by mouth daily. 90 tablet 3   tadalafil (CIALIS) 5 MG tablet Take 1 tablet (5 mg total) by mouth daily as needed for [facility] and ED 90 tablet 3   ticagrelor (BRILINTA) 90 MG TABS tablet Take 1 tablet (90 mg total) by mouth 2 (two) times daily. 180 tablet 3   Vilazodone HCl (VIIBRYD) 40 MG TABS Take 1 tablet (40 mg total) by mouth daily with food. 90 tablet 3   anastrozole (ARIMIDEX) 1 MG tablet Take 0.5 tablets (0.5 mg total) by mouth once a week. (Patient not taking: Reported on 01/11/2022) 6 tablet 1    No results found for this or any previous visit (from the past 48 hour(s)). No results found.  ROS: Pain with rom of the right upper extremity  Physical Exam: Alert and oriented 56 y.o. male in no acute distress Cranial nerves 2-12 intact Cervical spine: full rom with no tenderness, nv intact distally Chest: active breath sounds bilaterally, no wheeze rhonchi or rales Heart: regular rate and rhythm, no murmur Abd: non tender non distended with active bowel sounds Hip is stable with rom  Right shoulder painful and weak rom Nv intact distally No rashes or edema  Assessment/Plan Assessment: right shoulder rotator cuff tear  Plan:  Patient will undergo a right shoulder rotator cuff repair by Dr. Veverly Fells at [facility] [facility] benefits and expectations were discussed with the patient. Patient understand risks,  benefits and expectations and wishes to proceed. Preoperative templating of the joint replacement has been completed, documented, and submitted to the Operating Room personnel in order to optimize intra-operative equipment management.   Merla Riches PA-C, MPAS [facility] is now [employer] [address] [place], [place], [place] [address] Phone: [phone] [email or web address] Facebook  [employer]

## 2022-01-24 NOTE — Progress Notes (Signed)
Anesthesia Chart Review   Case: [id number] Date/Time: 01/25/22 1200   Procedure: SHOULDER ARTHROSCOPY WITH SUBACROMIAL DECOMPRESSION AND MINI OPEN ROTATOR CUFF REPAIR, OPEN BICEPS TENDOESIS (Right) - 120 MIN   Anesthesia type: Choice   Pre-op diagnosis: right rotator cuff tear   Location: [facility] PROC[facility]URE [facility] / [facility]   Surgeons: Netta Cedars, MD       DISCUSSION:56 y.o. former smoker with h/o HTN, CAD (CABG), sleep apnea, ulcerative colitis, right rotator cuff tear scheduled for above procedure 01/25/2022 with Dr. Netta Cedars.   Pt seen by cardiology 01/11/2022. Per OV note, "Preoperative clearance: Patient has upcoming right shoulder surgery by Dr. Esmond Plants of [facility].  He has no problem achieving more than than 4 METS of activity.   Unfortunately patient did have a inferior posterior STEMI in June and found to have occluded SVG-RPLV, medical therapy was recommended to avoid distal embolization which can occlude the distal collaterals.  Since the MI occurred while he was on Plavix, he has been switched to aspirin and Brilinta instead.  He has been doing well in the past several months without exertional chest pain radiating down the left arm which was his previous anginal symptom.  I discussed case with his primary cardiologist Dr. Ellyn Hack, we felt that since the surgery will be 4 months out from the last MI, he should be at acceptable risk to proceed from the cardiac perspective.  We do prefer him to stay on aspirin through the surgery given his cardiac history, although he cannot hold Brilinta for 5 days prior to the surgery and restart as soon as possible afterward at the surgeon's discretion."  Anticipate pt can proceed with planned procedure barring acute status change and after evaluation DOS.  Same day workup.  VS: There were no vitals taken for this visit.  PROVIDERS: [name], MD is PCP   Glenetta Hew, MD is Cardiologist  LABS:  labs DOS (all labs ordered are  listed, but only abnormal results are displayed)  Labs Reviewed - No data to display   IMAGES:   EKG:   CV: Echo 12/01/2021 1. Left ventricular ejection fraction, by estimation, is 45 to 50%. Left  ventricular ejection fraction by 3D volume is 45 %. The left ventricle has  mildly decreased function. The left ventricle demonstrates regional wall  motion abnormalities (see  scoring diagram/findings for description). Left ventricular diastolic  parameters were normal. There is severe hypokinesis of the left  ventricular, basal-mid inferior wall.   2. Right ventricular systolic function is mildly reduced. The right  ventricular size is normal. Tricuspid regurgitation signal is inadequate  for assessing PA pressure.   3. Left atrial size was mildly dilated.   4. The mitral valve is normal in structure. Trivial mitral valve  regurgitation.   5. The aortic valve is tricuspid. Aortic valve regurgitation is not  visualized. No aortic stenosis is present.   6. There is mild dilatation of the ascending aorta, measuring 39 mm.   7. The inferior vena cava is dilated in size with >50% respiratory  variability, suggesting right atrial pressure of 8 mmHg.  Past Medical History:  Diagnosis Date   Carpal tunnel syndrome on left 12/2011   Chronic nasal congestion    Depression 01/19/2012   GERD (gastroesophageal reflux disease)    Gout    History of Doppler ultrasound    a. Carotid US 3/11: no ICA stenosis    History of echocardiogram    a. Echo 6/14: mod LVH,  EF 50-55%, inf-lat HK, Gr 1 DD, mild LAE   History of hiatal hernia    Hx of CABG 09/04/2019   LIMA-LAD, SVG-OM, SVG-Dx, and an SVG-RPLA.   Hyperlipidemia LDL goal < 70    Hypertension    under control; has been on med. since 1996   Multiple vessel coronary artery disease 1996, '02; '21   a. s/p Inf MI (age 51) >>PCI to RCA with tandem BMS (PS 1535); b. LHC 06/2000: pRCA 30-40%, prox/mid stents with 75% ISR, 30-40% at crux in RCA >>  PCI: Cutting Balloon atherectomy for ISR; c. 2012 Myoview-nonischemic; d. 07/2019 -cath with progression of disease/LM disease => CABG x4   Myocardial infarct, old 1996   PCI   and 2021 and 6/ 2023   Sleep apnea    mild no cpap   Ulcerative colitis [facility])     Past Surgical History:  Procedure Laterality Date   CAROTID DOPPLERS  02/2020   R ICA 1-39%.  R CCA <50%.  LICA no evidence of stenosis.  Minimal plaque in extracranial vessels.  Normal bilateral vertebral arteries and subclavian arteries.   CARPAL TUNNEL RELEASE  01/26/2012   Procedure: CARPAL TUNNEL RELEASE;  Surgeon: Cammie Sickle., MD;  Location: [place];  Service: Orthopedics;  Laterality: Left;   CORONARY ANGIOPLASTY  07/11/2000   Cutting Balloon PTCA for RCA ISR   CORONARY ARTERY BYPASS GRAFT N/A 09/04/2019   Procedure: CORONARY ARTERY BYPASS GRAFTING (CABG) using LIMA to LAD; Endoscopic Vein Harvest of Right Greater Saphenous: SVG to Diag1; SVG to OM2; SVG to PL.;  Surgeon: Gaye Pollack, MD;  Location: [place];  Service: Open Heart Surgery;  Laterality: N/A;   CORONARY STENT PLACEMENT  1996   [id number] BMS x 2 in RCA   CYSTECTOMY  as a child   from neck   ENDOVEIN HARVEST OF GREATER SAPHENOUS VEIN Right 09/04/2019   Procedure: [place];  Surgeon: Gaye Pollack, MD;  Location: [place];  Service: Open Heart Surgery;  Laterality: Right;  Endovein scope harvest of right upper and lower LE.   HERNIA REPAIR     LEFT HEART CATH AND CORONARY ANGIOGRAPHY N/A 08/22/2019   Procedure: LEFT HEART CATH AND CORONARY ANGIOGRAPHY;  Surgeon: Belva Crome, MD;  Location: [place] [facility] LAB;;  Eccentric ostial 40 of 50% LM followed by 55-70% distal LM.  65% midLAD (heavily calcified), ostial D1 80%, LCx 70% between OM1 and OM2, RCA large with diffuse ISR prior PCI in 1996 and 2002-up to 90%.  PDA with competitive flow due to collateral flow from LCA.  EF 50-60%.  LVEDP 17    LEFT HEART CATH  AND CORS/GRAFTS ANGIOGRAPHY N/A 07/07/2020   Procedure: LEFT HEART CATH AND CORS/GRAFTS ANGIOGRAPHY;  Surgeon: [name], MD;  Location: [place] [facility] LAB;; New: 100% RCA CTO, 100% CTO p-m Cx after OM1; CTO SVG-D1 (? Culprit). Stable: Ost-pLM 50%, dLM 60%. Ost D1 80%, mid LAD 60% (pre LIMA) -> Patent LIMA-LAD. OM1 patent w/ diffuse mild Dz. Patent SVG-OM2, SVG--RPL1 (no Competitive native flow).  EF ~ 45% w/ Inflat HK. (Rec Echo)   LEFT HEART CATH AND CORS/GRAFTS ANGIOGRAPHY N/A 09/19/2021   Procedure: LEFT HEART CATH AND CORS/GRAFTS ANGIOGRAPHY;  Surgeon: Troy Sine, MD;  Location: [place] [facility];  Service: Cardiovascular;  Laterality: N/A;   NM MYOVIEW LTD  02/2011   Inferior infarct but no ischemia   NM MYOVIEW LTD  07/02/2020   EF roughly 40%.  Horizontal ST segment depression in anterolateral leads.  Also T wave inversions noted.  Medium size, partially reversible apical anterior and apical defect concerning for prior infarct with peri-infarct ischemia.  Intermediate risk.  Global HK with septal HK from prior sternotomy.   TEE WITHOUT CARDIOVERSION N/A 09/04/2019   Procedure: INTRAOPERATIVE TRANSESOPHAGEAL ECHOCARDIOGRAM (TEE);  Surgeon: Gaye Pollack, MD;  Location: [place];  Service: Open Heart Surgery;  Intra-Op TEE: EF 50-55%.  Mild MR normal aortic, pulmonic and tricuspid valves. ->  Stable postop   TONSILLECTOMY  as a child   TRANSTHORACIC ECHOCARDIOGRAM  07/16/2020   EF 55 to 60%.  Paradoxical septal motion c/w Post-OP Sternotomy.  GR 1 DD.  Normal valves.  Normal RV. No change from 09/13/2017   ZIO PATCH EVENT MONITOR  03/2020   Rare PACs and PVCs.  No arrhythmias.  Mostly sinus rhythm rate ranged from 58 to 146 bpm.  Average 81 bpm.    MEDICATIONS:  acetaminophen (TYLENOL) 500 MG tablet   allopurinol (ZYLOPRIM) 300 MG tablet   anastrozole (ARIMIDEX) 1 MG tablet   aspirin EC 81 MG tablet   Cholecalciferol (VITAMIN D) 125 MCG (5000 UT) CAPS   Cyanocobalamin (VITAMIN  B-12) 1000 MCG/15ML LIQD   DHEA 25 MG tablet   empagliflozin (JARDIANCE) 10 MG TABS tablet   esomeprazole (NEXIUM) 40 MG capsule   ezetimibe (ZETIA) 10 MG tablet   fluticasone (FLONASE) 50 MCG/ACT nasal spray   losartan (COZAAR) 25 MG tablet   mesalamine (LIALDA) 1.2 g EC tablet   methocarbamol (ROBAXIN) 500 MG tablet   metoprolol succinate (TOPROL-XL) 100 MG 24 hr tablet   nitroGLYCERIN (NITROSTAT) 0.4 MG SL tablet   ondansetron (ZOFRAN) 4 MG tablet   ranolazine (RANEXA) 500 MG 12 hr tablet   rosuvastatin (CRESTOR) 40 MG tablet   tadalafil (CIALIS) 5 MG tablet   ticagrelor (BRILINTA) 90 MG TABS tablet   Vilazodone HCl (VIIBRYD) 40 MG TABS   No current facility-administered medications for this encounter.   Konrad Felix Ward, PA-C [facility] [phone]

## 2022-01-24 NOTE — Anesthesia Preprocedure Evaluation (Addendum)
Anesthesia Evaluation  Patient identified by MRN, date of birth, ID band Patient awake    Reviewed: Allergy & Precautions, H&P , NPO status , [name] Chart, lab work & pertinent test results, reviewed documented beta blocker date and time   Airway Mallampati: III  TM Distance: >3 FB Neck ROM: Full    Dental no notable dental hx. (+) Teeth Intact, Dental Advisory Given   Pulmonary sleep apnea , former smoker,    Pulmonary exam normal breath sounds clear to auscultation       Cardiovascular hypertension, Pt. on medications + CAD, + Past MI, + Cardiac Stents and + CABG   Rhythm:Regular Rate:Normal     Neuro/Psych Depression negative neurological ROS     GI/Hepatic Neg liver ROS, hiatal hernia, PUD, GERD  Medicated,  Endo/Other  negative endocrine ROS  Renal/GU negative Renal ROS  negative genitourinary   Musculoskeletal  (+) Arthritis , Osteoarthritis,    Abdominal   Peds  Hematology negative hematology ROS (+)   Anesthesia Other Findings   Reproductive/Obstetrics negative OB ROS                           Anesthesia Physical Anesthesia Plan  ASA: 3  Anesthesia Plan: General   Post-op Pain Management: Regional block* and Tylenol PO (pre-op)*   Induction: Intravenous  PONV Risk Score and Plan: 2 and Ondansetron, Dexamethasone and Midazolam  Airway Management Planned: Oral ETT  Additional Equipment: ClearSight  Intra-op Plan:   Post-operative Plan: Extubation in OR  Informed Consent: I have reviewed the patients History and Physical, chart, labs and discussed the procedure including the risks, benefits and alternatives for the proposed anesthesia with the patient or authorized representative who has indicated his/her understanding and acceptance.     Dental advisory given  Plan Discussed with: CRNA  Anesthesia Plan Comments: (See PAT note 01/24/2022)      Anesthesia  Quick Evaluation

## 2022-01-25 ENCOUNTER — Ambulatory Visit (HOSPITAL_COMMUNITY)
Admission: RE | Admit: 2022-01-25 | Discharge: 2022-01-25 | Disposition: A | Discharge status: Home / Self Care | Payer: 59 | Attending: Orthopedic Surgery | Admitting: Orthopedic Surgery

## 2022-01-25 ENCOUNTER — Other Ambulatory Visit (HOSPITAL_COMMUNITY): Payer: Self-pay

## 2022-01-25 ENCOUNTER — Encounter (HOSPITAL_COMMUNITY)
Admission: RE | Disposition: A | Discharge status: Home / Self Care | Payer: Self-pay | Source: Home / Self Care | Attending: Orthopedic Surgery

## 2022-01-25 ENCOUNTER — Ambulatory Visit (HOSPITAL_COMMUNITY): Payer: 59 | Admitting: Physician Assistant

## 2022-01-25 ENCOUNTER — Ambulatory Visit (HOSPITAL_BASED_OUTPATIENT_CLINIC_OR_DEPARTMENT_OTHER): Payer: 59

## 2022-01-25 ENCOUNTER — Encounter (HOSPITAL_COMMUNITY): Payer: Self-pay | Admitting: Orthopedic Surgery

## 2022-01-25 DIAGNOSIS — Y939 Activity, unspecified: Secondary | ICD-10-CM | POA: Insufficient documentation

## 2022-01-25 DIAGNOSIS — S46011A Strain of muscle(s) and tendon(s) of the rotator cuff of right shoulder, initial encounter: Secondary | ICD-10-CM | POA: Diagnosis not present

## 2022-01-25 DIAGNOSIS — I10 Essential (primary) hypertension: Secondary | ICD-10-CM | POA: Diagnosis not present

## 2022-01-25 DIAGNOSIS — S43431A Superior glenoid labrum lesion of right shoulder, initial encounter: Secondary | ICD-10-CM

## 2022-01-25 DIAGNOSIS — X58XXXA Exposure to other specified factors, initial encounter: Secondary | ICD-10-CM | POA: Insufficient documentation

## 2022-01-25 DIAGNOSIS — K219 Gastro-esophageal reflux disease without esophagitis: Secondary | ICD-10-CM | POA: Insufficient documentation

## 2022-01-25 DIAGNOSIS — Z951 Presence of aortocoronary bypass graft: Secondary | ICD-10-CM | POA: Insufficient documentation

## 2022-01-25 DIAGNOSIS — I2510 Atherosclerotic heart disease of native coronary artery without angina pectoris: Secondary | ICD-10-CM | POA: Insufficient documentation

## 2022-01-25 DIAGNOSIS — M1990 Unspecified osteoarthritis, unspecified site: Secondary | ICD-10-CM | POA: Diagnosis not present

## 2022-01-25 DIAGNOSIS — G4730 Sleep apnea, unspecified: Secondary | ICD-10-CM | POA: Insufficient documentation

## 2022-01-25 DIAGNOSIS — G8918 Other acute postprocedural pain: Secondary | ICD-10-CM | POA: Diagnosis not present

## 2022-01-25 DIAGNOSIS — M75101 Unspecified rotator cuff tear or rupture of right shoulder, not specified as traumatic: Secondary | ICD-10-CM | POA: Diagnosis not present

## 2022-01-25 DIAGNOSIS — K279 Peptic ulcer, site unspecified, unspecified as acute or chronic, without hemorrhage or perforation: Secondary | ICD-10-CM | POA: Insufficient documentation

## 2022-01-25 DIAGNOSIS — F32A Depression, unspecified: Secondary | ICD-10-CM | POA: Diagnosis not present

## 2022-01-25 DIAGNOSIS — Z87891 Personal history of nicotine dependence: Secondary | ICD-10-CM | POA: Insufficient documentation

## 2022-01-25 DIAGNOSIS — R7303 Prediabetes: Secondary | ICD-10-CM

## 2022-01-25 DIAGNOSIS — M7521 Bicipital tendinitis, right shoulder: Secondary | ICD-10-CM | POA: Diagnosis not present

## 2022-01-25 DIAGNOSIS — I252 Old myocardial infarction: Secondary | ICD-10-CM

## 2022-01-25 DIAGNOSIS — K449 Diaphragmatic hernia without obstruction or gangrene: Secondary | ICD-10-CM | POA: Diagnosis not present

## 2022-01-25 HISTORY — PX: SHOULDER ARTHROSCOPY WITH SUBACROMIAL DECOMPRESSION AND OPEN ROTATOR C: SHX5688

## 2022-01-25 LAB — BASIC METABOLIC PANEL
Anion gap: 8 (ref 5–15)
BUN: 18 mg/dL (ref 6–20)
CO2: 23 mmol/L (ref 22–32)
Calcium: 9.3 mg/dL (ref 8.9–10.3)
Chloride: 105 mmol/L (ref 98–111)
Creatinine, Ser: 1.02 mg/dL (ref 0.61–1.24)
GFR, Estimated: >60 mL/min (ref >60)
Glucose, Bld: 102 mg/dL — ABNORMAL HIGH (ref 70–99)
Potassium: 3.7 mmol/L (ref 3.5–5.1)
Sodium: 136 mmol/L (ref 135–145)

## 2022-01-25 LAB — CBC
HCT: 49.6 % (ref 39.0–52.0)
Hemoglobin: 15 g/dL (ref 13.0–17.0)
MCH: 22.7 pg — ABNORMAL LOW (ref 26.0–34.0)
MCHC: 30.2 g/dL (ref 30.0–36.0)
MCV: 75.2 fL — ABNORMAL LOW (ref 80.0–100.0)
Platelets: 249 10*3/uL (ref 150–400)
RBC: 6.6 MIL/uL — ABNORMAL HIGH (ref 4.22–5.81)
RDW: 20.4 % — ABNORMAL HIGH (ref 11.5–15.5)
WBC: 10.3 10*3/uL (ref 4.0–10.5)
nRBC: 0 % (ref 0.0–0.2)

## 2022-01-25 LAB — GLUCOSE, CAPILLARY: Glucose-Capillary: 106 mg/dL — ABNORMAL HIGH (ref 70–99)

## 2022-01-25 LAB — HEMOGLOBIN A1C
Hgb A1c MFr Bld: 5.8 % — ABNORMAL HIGH (ref 4.8–5.6)
Mean Plasma Glucose: 119.76 mg/dL

## 2022-01-25 SURGERY — SHOULDER ARTHROSCOPY WITH SUBACROMIAL DECOMPRESSION AND OPEN ROTATOR CUFF REPAIR, OPEN BICEPS TENDON REPAIR
Anesthesia: General | Laterality: Right

## 2022-01-25 MED ORDER — BUPIVACAINE-EPINEPHRINE 0.25% -1:200000 IJ SOLN
INTRAMUSCULAR | Status: DC | PRN
  Administered 2022-01-25: 11 mL

## 2022-01-25 MED ORDER — BUPIVACAINE-EPINEPHRINE (PF) 0.5% -1:200000 IJ SOLN
INTRAMUSCULAR | Status: AC
  Filled 2022-01-25: qty 30

## 2022-01-25 MED ORDER — ROCURONIUM BROMIDE 10 MG/ML (PF) SYRINGE
PREFILLED_SYRINGE | INTRAVENOUS | Status: AC
  Filled 2022-01-25: qty 10

## 2022-01-25 MED ORDER — ACETAMINOPHEN 500 MG PO TABS
1000.0000 mg | ORAL_TABLET | Freq: Once | ORAL | Status: AC
  Administered 2022-01-25: 1000 mg via ORAL
  Filled 2022-01-25: qty 2

## 2022-01-25 MED ORDER — DEXMEDETOMIDINE HCL IN NACL 80 MCG/20ML IV SOLN
INTRAVENOUS | Status: DC | PRN
  Administered 2022-01-25: 8 ug via BUCCAL

## 2022-01-25 MED ORDER — KETOROLAC TROMETHAMINE 30 MG/ML IJ SOLN
INTRAMUSCULAR | Status: AC
  Filled 2022-01-25: qty 1

## 2022-01-25 MED ORDER — PROPOFOL 10 MG/ML IV BOLUS
INTRAVENOUS | Status: AC
  Filled 2022-01-25: qty 20

## 2022-01-25 MED ORDER — LACTATED RINGERS IV SOLN: INTRAVENOUS | Status: DC

## 2022-01-25 MED ORDER — BUPIVACAINE LIPOSOME 1.3 % IJ SUSP
INTRAMUSCULAR | Status: DC | PRN
  Administered 2022-01-25: 10 mL via PERINEURAL

## 2022-01-25 MED ORDER — DEXAMETHASONE SODIUM PHOSPHATE 10 MG/ML IJ SOLN
INTRAMUSCULAR | Status: AC
  Filled 2022-01-25: qty 1

## 2022-01-25 MED ORDER — EPHEDRINE SULFATE-NACL 50-0.9 MG/10ML-% IV SOSY
PREFILLED_SYRINGE | INTRAVENOUS | Status: DC | PRN
  Administered 2022-01-25 (×2): 5 mg via INTRAVENOUS

## 2022-01-25 MED ORDER — ORAL CARE MOUTH RINSE: 15.0000 mL | Freq: Once | OROMUCOSAL | Status: AC

## 2022-01-25 MED ORDER — PHENYLEPHRINE HCL-NACL 20-0.9 MG/250ML-% IV SOLN
INTRAVENOUS | Status: DC | PRN
  Administered 2022-01-25: 35 ug/min via INTRAVENOUS

## 2022-01-25 MED ORDER — SUGAMMADEX SODIUM 200 MG/2ML IV SOLN
INTRAVENOUS | Status: DC | PRN
  Administered 2022-01-25: 200 mg via INTRAVENOUS

## 2022-01-25 MED ORDER — MIDAZOLAM HCL 2 MG/2ML IJ SOLN
INTRAMUSCULAR | Status: AC
  Filled 2022-01-25: qty 2

## 2022-01-25 MED ORDER — ONDANSETRON HCL 4 MG/2ML IJ SOLN
INTRAMUSCULAR | Status: AC
  Filled 2022-01-25: qty 2

## 2022-01-25 MED ORDER — BUPIVACAINE-EPINEPHRINE (PF) 0.25% -1:200000 IJ SOLN
INTRAMUSCULAR | Status: AC
  Filled 2022-01-25: qty 30

## 2022-01-25 MED ORDER — MIDAZOLAM HCL 2 MG/2ML IJ SOLN
1.0000 mg | INTRAMUSCULAR | Status: DC
  Administered 2022-01-25: 2 mg via INTRAVENOUS
  Filled 2022-01-25: qty 2

## 2022-01-25 MED ORDER — PHENYLEPHRINE 80 MCG/ML (10ML) SYRINGE FOR IV PUSH (FOR BLOOD PRESSURE SUPPORT)
PREFILLED_SYRINGE | INTRAVENOUS | Status: DC | PRN
  Administered 2022-01-25 (×2): 80 ug via INTRAVENOUS
  Administered 2022-01-25: 160 ug via INTRAVENOUS

## 2022-01-25 MED ORDER — FENTANYL CITRATE (PF) 100 MCG/2ML IJ SOLN
INTRAMUSCULAR | Status: AC
  Filled 2022-01-25: qty 2

## 2022-01-25 MED ORDER — ROCURONIUM BROMIDE 10 MG/ML (PF) SYRINGE
PREFILLED_SYRINGE | INTRAVENOUS | Status: DC | PRN
  Administered 2022-01-25: 50 mg via INTRAVENOUS

## 2022-01-25 MED ORDER — MIDAZOLAM HCL 5 MG/5ML IJ SOLN
INTRAMUSCULAR | Status: DC | PRN
  Administered 2022-01-25 (×2): 1 mg via INTRAVENOUS

## 2022-01-25 MED ORDER — 0.9 % SODIUM CHLORIDE (POUR BTL) OPTIME
TOPICAL | Status: DC | PRN
  Administered 2022-01-25: 1000 mL

## 2022-01-25 MED ORDER — FENTANYL CITRATE PF 50 MCG/ML IJ SOSY
50.0000 ug | PREFILLED_SYRINGE | INTRAMUSCULAR | Status: DC
  Administered 2022-01-25: 100 ug via INTRAVENOUS
  Filled 2022-01-25: qty 2

## 2022-01-25 MED ORDER — OXYCODONE-ACETAMINOPHEN 5-325 MG PO TABS
1.0000 | ORAL_TABLET | ORAL | 0 refills | Status: DC | PRN
  Filled 2022-01-25: qty 20, 4d supply, fill #0

## 2022-01-25 MED ORDER — ONDANSETRON HCL 4 MG/2ML IJ SOLN
INTRAMUSCULAR | Status: DC | PRN
  Administered 2022-01-25: 4 mg via INTRAVENOUS

## 2022-01-25 MED ORDER — ONDANSETRON HCL 4 MG PO TABS
4.0000 mg | ORAL_TABLET | Freq: Three times a day (TID) | ORAL | 1 refills | Status: DC | PRN
  Filled 2022-01-25: qty 30, 10d supply, fill #0
  Filled 2022-06-13: qty 30, 10d supply, fill #1

## 2022-01-25 MED ORDER — LIDOCAINE 2% (20 MG/ML) 5 ML SYRINGE
INTRAMUSCULAR | Status: DC | PRN
  Administered 2022-01-25: 80 mg via INTRAVENOUS

## 2022-01-25 MED ORDER — FENTANYL CITRATE (PF) 100 MCG/2ML IJ SOLN
INTRAMUSCULAR | Status: DC | PRN
  Administered 2022-01-25: 25 ug via INTRAVENOUS
  Administered 2022-01-25: 50 ug via INTRAVENOUS
  Administered 2022-01-25: 25 ug via INTRAVENOUS

## 2022-01-25 MED ORDER — BUPIVACAINE-EPINEPHRINE (PF) 0.5% -1:200000 IJ SOLN
INTRAMUSCULAR | Status: DC | PRN
  Administered 2022-01-25: 15 mL via PERINEURAL

## 2022-01-25 MED ORDER — HYDROMORPHONE HCL 1 MG/ML IJ SOLN: 0.2500 mg | INTRAMUSCULAR | Status: DC | PRN

## 2022-01-25 MED ORDER — CEFAZOLIN SODIUM-DEXTROSE 2-4 GM/100ML-% IV SOLN
2.0000 g | INTRAVENOUS | Status: AC
  Administered 2022-01-25: 2 g via INTRAVENOUS
  Filled 2022-01-25: qty 100

## 2022-01-25 MED ORDER — METHOCARBAMOL 500 MG PO TABS
500.0000 mg | ORAL_TABLET | Freq: Four times a day (QID) | ORAL | 1 refills | Status: DC | PRN
  Filled 2022-01-25: qty 40, 10d supply, fill #0

## 2022-01-25 MED ORDER — KETOROLAC TROMETHAMINE 30 MG/ML IJ SOLN
INTRAMUSCULAR | Status: DC | PRN
  Administered 2022-01-25: 15 mg via INTRAVENOUS

## 2022-01-25 MED ORDER — CHLORHEXIDINE GLUCONATE 0.12 % MT SOLN
15.0000 mL | Freq: Once | OROMUCOSAL | Status: AC
  Administered 2022-01-25: 15 mL via OROMUCOSAL

## 2022-01-25 MED ORDER — PROPOFOL 10 MG/ML IV BOLUS
INTRAVENOUS | Status: DC | PRN
  Administered 2022-01-25: 150 mg via INTRAVENOUS

## 2022-01-25 MED ORDER — DEXAMETHASONE SODIUM PHOSPHATE 10 MG/ML IJ SOLN
INTRAMUSCULAR | Status: DC | PRN
  Administered 2022-01-25: 10 mg via INTRAVENOUS

## 2022-01-25 MED ORDER — LIDOCAINE HCL (PF) 2 % IJ SOLN
INTRAMUSCULAR | Status: AC
  Filled 2022-01-25: qty 5

## 2022-01-25 SURGICAL SUPPLY — 71 items
AID PSTN UNV HD RSTRNT DISP (MISCELLANEOUS) ×1
ANCH SUT 1.3 2 RBN BLU WHT (Anchor) ×1 IMPLANT
ANCH SUT 2 1.3X1 LD 1 STRN (Anchor) ×1 IMPLANT
ANCHOR ALL SUT RC W2 1.3 RIB (Anchor) ×1 IMPLANT
ANCHOR ALL-SUT FLEX 1.3 Y-KNOT (Anchor) IMPLANT
ANCHOR ALL-SUT RC W2 1.3 RIB (Anchor) IMPLANT
APL SKNCLS STERI-STRIP NONHPOA (GAUZE/BANDAGES/DRESSINGS) ×1
BAG COUNTER SPONGE SURGICOUNT (BAG) IMPLANT
BAG SPNG CNTER NS LX DISP (BAG)
BENZOIN TINCTURE PRP APPL 2/3 (GAUZE/BANDAGES/DRESSINGS) IMPLANT
BIT DRILL 1.3M DISPOSABLE (BIT) IMPLANT
BLADE AVERAGE 25X9 (BLADE) IMPLANT
BLADE CLIPPER SURG (BLADE) IMPLANT
BLADE EXCALIBUR 4.0X13 (MISCELLANEOUS) IMPLANT
BLADE SURG 15 STRL LF DISP TIS (BLADE) ×1 IMPLANT
BLADE SURG 15 STRL SS (BLADE) ×1
BLADE SURG SZ10 CARB STEEL (BLADE) ×1 IMPLANT
BURR OVAL 8 FLU 4.0X13 (MISCELLANEOUS) ×1 IMPLANT
CUTTER BONE 4.0MM X 13CM (MISCELLANEOUS) ×1 IMPLANT
DISSECTOR  3.8MM X 13CM (MISCELLANEOUS)
DISSECTOR 3.8MM X 13CM (MISCELLANEOUS) IMPLANT
DRAPE IMP U-DRAPE 54X76 (DRAPES) ×1 IMPLANT
DRAPE INCISE IOBAN 66X45 STRL (DRAPES) ×1 IMPLANT
DRAPE ORTHO SPLIT 77X108 STRL (DRAPES) ×2
DRAPE STERI 35X30 U-POUCH (DRAPES) ×1 IMPLANT
DRAPE SURG ORHT 6 SPLT 77X108 (DRAPES) ×2 IMPLANT
DRAPE U-SHAPE 47X51 STRL (DRAPES) ×1 IMPLANT
DRSG EMULSION OIL 3X3 NADH (GAUZE/BANDAGES/DRESSINGS) ×1 IMPLANT
DURAPREP 26ML APPLICATOR (WOUND CARE) ×1 IMPLANT
ELECT NDL TIP 2.8 STRL (NEEDLE) ×1 IMPLANT
ELECT NEEDLE TIP 2.8 STRL (NEEDLE) ×1 IMPLANT
ELECT REM PT RETURN 15FT ADLT (MISCELLANEOUS) ×1 IMPLANT
GAUZE SPONGE 4X4 12PLY STRL (GAUZE/BANDAGES/DRESSINGS) ×1 IMPLANT
GLOVE BIOGEL PI IND STRL 7.5 (GLOVE) ×2 IMPLANT
GLOVE BIOGEL PI IND STRL 8.5 (GLOVE) ×2 IMPLANT
GLOVE ORTHO TXT STRL SZ7.5 (GLOVE) ×1 IMPLANT
GLOVE SURG ORTHO 8.5 STRL (GLOVE) ×1 IMPLANT
GOWN STRL REUS W/ TWL LRG LVL3 (GOWN DISPOSABLE) ×3 IMPLANT
GOWN STRL REUS W/TWL LRG LVL3 (GOWN DISPOSABLE) ×3
KIT BASIN OR (CUSTOM PROCEDURE TRAY) ×1 IMPLANT
KIT TURNOVER KIT A (KITS) IMPLANT
NDL MAYO 6 CRC TAPER PT (NEEDLE) IMPLANT
NDL MAYO CATGUT SZ4 TPR NDL (NEEDLE) IMPLANT
NEEDLE MAYO 6 CRC TAPER PT (NEEDLE) ×1 IMPLANT
NEEDLE MAYO CATGUT SZ4 (NEEDLE) ×2 IMPLANT
NS IRRIG 1000ML POUR BTL (IV SOLUTION) ×1 IMPLANT
PACK ARTHROSCOPY WL (CUSTOM PROCEDURE TRAY) ×1 IMPLANT
PENCIL SMOKE EVACUATOR (MISCELLANEOUS) IMPLANT
PORT APPOLLO RF 90DEGREE MULTI (SURGICAL WAND) ×1 IMPLANT
RESTRAINT HEAD UNIVERSAL NS (MISCELLANEOUS) ×1 IMPLANT
SLING ULTRA II L (ORTHOPEDIC SUPPLIES) IMPLANT
SPIKE FLUID TRANSFER (MISCELLANEOUS) ×1 IMPLANT
SPONGE T-LAP 4X18 ~~LOC~~+RFID (SPONGE) IMPLANT
STAPLER VISISTAT 35W (STAPLE) IMPLANT
STRIP CLOSURE SKIN 1/2X4 (GAUZE/BANDAGES/DRESSINGS) IMPLANT
SUT BONE WAX W31G (SUTURE) IMPLANT
SUT HI-FI 2 STRAND C-2 40 (SUTURE) IMPLANT
SUT MNCRL AB 4-0 PS2 18 (SUTURE) IMPLANT
SUT VIC AB 0 CT1 18XCR BRD 8 (SUTURE) IMPLANT
SUT VIC AB 0 CT1 27 (SUTURE) ×1
SUT VIC AB 0 CT1 27XBRD ANBCTR (SUTURE) IMPLANT
SUT VIC AB 0 CT1 8-18 (SUTURE)
SUT VIC AB 0 CT2 27 (SUTURE) IMPLANT
SUT VIC AB 2-0 CT1 27 (SUTURE) ×1
SUT VIC AB 2-0 CT1 TAPERPNT 27 (SUTURE) IMPLANT
SYR BULB IRRIG 60ML STRL (SYRINGE) ×1 IMPLANT
TAPE CLOTH SURG 4X10 WHT LF (GAUZE/BANDAGES/DRESSINGS) IMPLANT
TOWEL OR 17X26 10 PK STRL BLUE (TOWEL DISPOSABLE) ×1 IMPLANT
TUBING ARTHROSCOPY IRRIG 16FT (MISCELLANEOUS) ×1 IMPLANT
TUBING CONNECTING 10 (TUBING) ×1 IMPLANT
YANKAUER SUCT BULB TIP NO VENT (SUCTIONS) IMPLANT

## 2022-01-25 NOTE — Anesthesia Procedure Notes (Signed)
Procedure Name: Intubation Date/Time: 01/25/2022 12:38 PM  Performed by: Maxwell Caul, CRNAPre-anesthesia Checklist: Patient identified, Emergency Drugs available, Suction available and Patient being monitored Patient Re-evaluated:Patient Re-evaluated prior to induction Oxygen Delivery Method: Circle system utilized Preoxygenation: Pre-oxygenation with 100% oxygen Induction Type: IV induction Ventilation: Mask ventilation without difficulty Laryngoscope Size: Mac and 4 Grade View: Grade I Tube type: Oral Tube size: 7.5 mm Number of attempts: 1 Airway Equipment and Method: Stylet Placement Confirmation: ETT inserted through vocal cords under direct vision, positive ETCO2 and breath sounds checked- equal and bilateral Secured at: 22 cm Tube secured with: Tape Dental Injury: Teeth and Oropharynx as per pre-operative assessment

## 2022-01-25 NOTE — Op Note (Unsigned)
NAMECONTRELL, Daniel Buck MEDICAL RECORD NO: [id number] ACCOUNT NO: [id number] DATE OF BIRTH: Jun 05, 1965 FACILITY: [facility] LOCATION: [facility] PHYSICIAN: Doran Heater. Veverly Fells, MD  Operative Report   DATE OF PROCEDURE: 01/25/2022  PREOPERATIVE DIAGNOSES:  Right shoulder rotator cuff tear and SLAP tear.  POSTOPERATIVE DIAGNOSES:  Right shoulder rotator cuff tear and SLAP tear.  PROCEDURE PERFORMED:  Right shoulder arthroscopy with extensive intraarticular debridement of torn superior labrum anterior to posterior with arthroscopic biceps tenotomy as well as arthroscopic debridement of the inferior labral tear with mini open  rotator cuff repair and open biceps tenodesis in the groove.  ATTENDING SURGEON:  Doran Heater. Veverly Fells, MD  ASSISTANT:  Charletta Cousin [place], [place], who was scrubbed during the entire procedure, and necessary for satisfactory completion of surgery.  ANESTHESIA:  Interscalene block and general anesthesia was used.  ESTIMATED BLOOD LOSS:  50 mL  FLUID REPLACEMENT:  1500 mL crystalloid.  COUNTS:  Instrument counts correct.  COMPLICATIONS:  No complications.  ANTIBIOTICS:  Perioperative antibiotics were given.  INDICATIONS:  The patient is a 56 year old male with a history of worsening shoulder pain and worsening function secondary to full thickness rotator cuff tear.  The patient has failed conservative management, desires operative treatment to relieve pain  and restore function.  Informed consent obtained.  DESCRIPTION OF PROCEDURE:  After an adequate level of anesthesia was achieved, the patient was positioned in modified beach chair position.  Right shoulder correctly identified sterilely prepped and draped in the usual manner.  Timeout called, verifying  correct patient, correct site, we entered the patient's shoulder using standard arthroscopic portals including anterior, posterior and lateral portals.  We identified significant tearing in the superior labrum compromising the  biceps anchor.  We  performed a biceps tenotomy and labral debridement back to stable labral rim.  Extensive tearing of the labrum was noted down to the inferior position.  We debrided that with baskets and motorized shaver.  Articular cartilage in good shape with minimal  chondromalacia.  Subscap looked normal.  Supraspinatus was torn.  This was a full thickness tear involving the entire tendon.  Infraspinatus and teres minor appeared normal, we placed the scope in subacromial space.  A thorough bursectomy and  acromioplasty was performed creating a type 1 acromial shape with a butcher block technique utilizing a high speed bur, we did release the CA ligament.  Rotator cuff was clearly torn from the bursal surface.  At this point, we concluded the arthroscopic  portion of procedure, we made a small mini open incision starting at the anterolateral border of the acromion extending distally about 4 cm and the raphae between the anterior and lateral to the deltoid.  Dissection down through subcutaneous tissues.  We  identified the deltoid raphae and divided that with a needle tip Bovie, placed Arthrex retractor and identified the biceps sheath.  We delivered the biceps tendon out of the wound.  We whipstitched with #2 Hi-Fi suture to reinforce the tendon.  We then  prepped the floor of the biceps groove with a needle tip Bovie and a Freer elevator getting down to bleeding bone.  We placed a single Y-Knot flex anchor through the floor of the biceps groove and brought that suture up in a mattress fashion tying the  tendon down flush into the tunnel and appropriate tension we then took the longitudinal whipstitch sutures and a baseball sutures up through the rotator interval and tied over soft tissue bridge incorporating part of the subscap.  We had a  nice double  fixed repair.  Next, we went to the rotator cuff.  We identified a full thickness tear involving supraspinatus.  We took a total of 4 #2 Hi-Fi sutures  and placed that in the free edge of the tendon and then mobilized the tendon with a Cobb elevator on  both sides of the tendon, both the bursal side and joint side.  We then prepped the greater tuberosity with rongeur and curette getting down to bleeding bone.  We then took a single Y-Knot RC anchor and placed that at the medial part of the rotator cuff  footprint directly in the bone.  This was double loaded with ribbon.  We brought those ribbon sutures up over the medial part of the rotator cuff footprint in a mattress fashion so 2 medial mattress sutures and then the lateral sutures.  We took down  through drill holes in the bone.  The bone was extremely hard, but we had nice purchase and a nice double row repair that was watertight.  We ranged the shoulder and no relative motion noted at the repair site.  We irrigated thoroughly.  We then repaired  the deltoid meticulously with 0 Vicryl suture followed by 2-0 Vicryl for subcutaneous closure and 4-0 Monocryl for skin and portals.  Steri-Strips applied followed by sterile dressing.  The patient tolerated surgery well.   PUS D: 01/25/2022 2:28:17 pm T: 01/25/2022 5:07:00 pm  JOB: [id number]/ [id number]

## 2022-01-25 NOTE — Transfer of Care (Signed)
Immediate Anesthesia Transfer of Care Note  Patient: Daniel Buck  Procedure(s) Performed: SHOULDER ARTHROSCOPY WITH SUBACROMIAL DECOMPRESSION AND MINI OPEN ROTATOR CUFF REPAIR, OPEN BICEPS TENDOESIS (Right)  Patient Location: PACU  Anesthesia Type:General  Level of Consciousness: awake, alert  and oriented  Airway & Oxygen Therapy: Patient Spontanous Breathing and Patient connected to face mask oxygen  Post-op Assessment: Report given to RN and Post -op Vital signs reviewed and stable  Post vital signs: Reviewed and stable  Last Vitals:  Vitals Value Taken Time  BP 140/73 01/25/22 1434  Temp    Pulse 83 01/25/22 1435  Resp 21 01/25/22 1435  SpO2 98 % 01/25/22 1435  Vitals shown include unvalidated device data.  Last Pain:  Vitals:   01/25/22 1155  TempSrc:   PainSc: 0-No pain         Complications: No notable events documented.

## 2022-01-25 NOTE — Anesthesia Procedure Notes (Signed)
Anesthesia Regional Block: Interscalene brachial plexus block   Pre-Anesthetic Checklist: , timeout performed,  Correct Patient, Correct Site, Correct Laterality,  Correct Procedure, Correct Position, site marked,  Risks and benefits discussed,  Pre-op evaluation,  At surgeon's request and post-op pain management  Laterality: Right  Prep: Maximum Sterile Barrier Precautions used, chloraprep       Needles:  Injection technique: Single-shot  Needle Type: Echogenic Stimulator Needle     Needle Length: 5cm  Needle Gauge: 22     Additional Needles:   Procedures:,,,, ultrasound used (permanent image in chart),,    Narrative:  Start time: 01/25/2022 11:49 AM End time: 01/25/2022 11:59 AM Injection made incrementally with aspirations every 5 mL.  Performed by: Personally  Anesthesiologist: Roderic Palau, MD

## 2022-01-25 NOTE — Interval H&P Note (Signed)
History and Physical Interval Note:  01/25/2022 12:11 PM  Daniel Buck  has presented today for surgery, with the diagnosis of right rotator cuff tear.  The various methods of treatment have been discussed with the patient and family. After consideration of risks, benefits and other options for treatment, the patient has consented to  Procedure(s) with comments: SHOULDER ARTHROSCOPY WITH SUBACROMIAL DECOMPRESSION AND MINI OPEN ROTATOR CUFF REPAIR, OPEN BICEPS TENDOESIS (Right) - 120 MIN as a surgical intervention.  The patient's history has been reviewed, patient examined, no change in status, stable for surgery.  I have reviewed the patient's chart and labs.  Questions were answered to the patient's satisfaction.     Augustin Schooling

## 2022-01-25 NOTE — Brief Op Note (Signed)
01/25/2022  2:23 PM  PATIENT:  Daniel Buck  56 y.o. male  PRE-OPERATIVE DIAGNOSIS:  right rotator cuff tear, SLAP tear  POST-OPERATIVE DIAGNOSIS:  right rotator cuff tear, SLAP tear  PROCEDURE:  Procedure(s) with comments: SHOULDER ARTHROSCOPY WITH SUBACROMIAL DECOMPRESSION AND MINI OPEN ROTATOR CUFF REPAIR, OPEN BICEPS TENDOESIS (Right) - 120 MIN  SURGEON:  Surgeon(s) and Role:    Netta Cedars, MD - Primary  PHYSICIAN ASSISTANT:   ASSISTANTS: [place] Bruns, PA-C   ANESTHESIA:   regional and general  EBL:  25 mL   BLOOD ADMINISTERED:none  DRAINS: none   LOCAL MEDICATIONS USED:  MARCAINE     SPECIMEN:  No Specimen  DISPOSITION OF SPECIMEN:  N/A  COUNTS:  YES  TOURNIQUET:  * No tourniquets in log *  DICTATION: .Other Dictation: Dictation Number [id number]  PLAN OF CARE: Discharge to home after PACU  PATIENT DISPOSITION:  PACU - hemodynamically stable.   Delay start of Pharmacological VTE agent (>24hrs) due to surgical blood loss or risk of bleeding: not applicable

## 2022-01-25 NOTE — Anesthesia Postprocedure Evaluation (Signed)
Anesthesia Post Note  Patient: Daniel Buck  Procedure(s) Performed: SHOULDER ARTHROSCOPY WITH SUBACROMIAL DECOMPRESSION AND MINI OPEN ROTATOR CUFF REPAIR, OPEN BICEPS TENDOESIS (Right)     Patient location during evaluation: PACU Anesthesia Type: General and Regional Level of consciousness: awake and alert Pain management: pain level controlled Vital Signs Assessment: post-procedure vital signs reviewed and stable Respiratory status: spontaneous breathing, nonlabored ventilation and respiratory function stable Cardiovascular status: blood pressure returned to baseline and stable Postop Assessment: no apparent nausea or vomiting Anesthetic complications: no   No notable events documented.  Last Vitals:  Vitals:   01/25/22 1500 01/25/22 1515  BP: 134/81 138/84  Pulse: 81 86  Resp: 17 19  Temp:  36.7 C  SpO2: 95% 96%    Last Pain:  Vitals:   01/25/22 1515  TempSrc:   PainSc: 0-No pain                 Swetha Rayle,W. EDMOND

## 2022-01-25 NOTE — Discharge Instructions (Signed)
Ice to the shoulder constantly.  Keep the incision covered and clean and dry for one week, then ok to get it wet in the shower.  Do exercise as instructed several times per day.  Pendulums - dangle arm in circles Lap slides - with hand resting on lap or on a pillow, slide the hand forward and back knee to hip, back and forth Gentle rotation - with you elbow bent at a right angle rotate you arm in to your stomach and out away from you like a swinging gate.   DO NOT reach behind your back or push up out of a chair with the operative arm.  Use a sling while you are up and around for comfort, may remove while seated.  Keep pillow propped behind the operative elbow.  Follow up with Dr Veverly Fells in two weeks in the office, call [phone] for appt

## 2022-01-26 ENCOUNTER — Other Ambulatory Visit: Payer: Self-pay

## 2022-01-27 DIAGNOSIS — R6882 Decreased libido: Secondary | ICD-10-CM | POA: Diagnosis not present

## 2022-01-27 DIAGNOSIS — E291 Testicular hypofunction: Secondary | ICD-10-CM | POA: Diagnosis not present

## 2022-01-27 DIAGNOSIS — R5383 Other fatigue: Secondary | ICD-10-CM | POA: Diagnosis not present

## 2022-01-27 DIAGNOSIS — Z6826 Body mass index (BMI) 26.0-26.9, adult: Secondary | ICD-10-CM | POA: Diagnosis not present

## 2022-01-30 ENCOUNTER — Ambulatory Visit: Payer: 59 | Admitting: Cardiology

## 2022-01-30 ENCOUNTER — Encounter (HOSPITAL_COMMUNITY): Payer: Self-pay | Admitting: Orthopedic Surgery

## 2022-01-30 DIAGNOSIS — M25611 Stiffness of right shoulder, not elsewhere classified: Secondary | ICD-10-CM | POA: Diagnosis not present

## 2022-01-30 DIAGNOSIS — M25511 Pain in right shoulder: Secondary | ICD-10-CM | POA: Diagnosis not present

## 2022-02-02 ENCOUNTER — Ambulatory Visit
Admission: RE | Admit: 2022-02-02 | Discharge: 2022-02-02 | Discharge status: Against Medical Advice | Payer: 59 | Source: Ambulatory Visit | Attending: Family Medicine | Admitting: Family Medicine

## 2022-02-02 DIAGNOSIS — M25611 Stiffness of right shoulder, not elsewhere classified: Secondary | ICD-10-CM | POA: Diagnosis not present

## 2022-02-02 DIAGNOSIS — M25511 Pain in right shoulder: Secondary | ICD-10-CM | POA: Diagnosis not present

## 2022-02-03 ENCOUNTER — Other Ambulatory Visit (HOSPITAL_BASED_OUTPATIENT_CLINIC_OR_DEPARTMENT_OTHER): Payer: Self-pay

## 2022-02-03 ENCOUNTER — Other Ambulatory Visit (HOSPITAL_COMMUNITY): Payer: Self-pay

## 2022-02-03 MED ORDER — MESALAMINE 1.2 G PO TBEC
4.8000 g | DELAYED_RELEASE_TABLET | Freq: Every day | ORAL | 1 refills | Status: DC
  Filled 2022-02-03: qty 360, 90d supply, fill #0
  Filled 2022-05-29: qty 360, 90d supply, fill #1

## 2022-02-06 ENCOUNTER — Other Ambulatory Visit (HOSPITAL_COMMUNITY): Payer: Self-pay

## 2022-02-06 DIAGNOSIS — M25611 Stiffness of right shoulder, not elsewhere classified: Secondary | ICD-10-CM | POA: Diagnosis not present

## 2022-02-06 DIAGNOSIS — M25511 Pain in right shoulder: Secondary | ICD-10-CM | POA: Diagnosis not present

## 2022-02-09 DIAGNOSIS — M25511 Pain in right shoulder: Secondary | ICD-10-CM | POA: Diagnosis not present

## 2022-02-09 DIAGNOSIS — M25611 Stiffness of right shoulder, not elsewhere classified: Secondary | ICD-10-CM | POA: Diagnosis not present

## 2022-02-13 DIAGNOSIS — M25511 Pain in right shoulder: Secondary | ICD-10-CM | POA: Diagnosis not present

## 2022-02-13 DIAGNOSIS — M25611 Stiffness of right shoulder, not elsewhere classified: Secondary | ICD-10-CM | POA: Diagnosis not present

## 2022-02-15 ENCOUNTER — Other Ambulatory Visit (HOSPITAL_COMMUNITY): Payer: Self-pay

## 2022-02-15 DIAGNOSIS — M25511 Pain in right shoulder: Secondary | ICD-10-CM | POA: Diagnosis not present

## 2022-02-15 DIAGNOSIS — M25611 Stiffness of right shoulder, not elsewhere classified: Secondary | ICD-10-CM | POA: Diagnosis not present

## 2022-02-15 MED ORDER — "NEEDLE (DISP) 18G X 1"" MISC"
3 refills | Status: AC
  Filled 2022-02-15: qty 12, 84d supply, fill #0
  Filled 2022-02-17: qty 12, 90d supply, fill #0
  Filled 2022-05-29: qty 12, 12d supply, fill #0

## 2022-02-15 MED ORDER — "BD LUER-LOK SYRINGE 25G X 1"" 3 ML MISC"
3 refills | Status: AC
  Filled 2022-02-15: qty 12, 84d supply, fill #0
  Filled 2022-05-29: qty 12, 12d supply, fill #0

## 2022-02-17 ENCOUNTER — Other Ambulatory Visit (HOSPITAL_COMMUNITY): Payer: Self-pay

## 2022-02-20 DIAGNOSIS — M25511 Pain in right shoulder: Secondary | ICD-10-CM | POA: Diagnosis not present

## 2022-02-20 DIAGNOSIS — M25611 Stiffness of right shoulder, not elsewhere classified: Secondary | ICD-10-CM | POA: Diagnosis not present

## 2022-02-23 DIAGNOSIS — H5203 Hypermetropia, bilateral: Secondary | ICD-10-CM | POA: Diagnosis not present

## 2022-02-23 DIAGNOSIS — M25511 Pain in right shoulder: Secondary | ICD-10-CM | POA: Diagnosis not present

## 2022-02-23 DIAGNOSIS — M25611 Stiffness of right shoulder, not elsewhere classified: Secondary | ICD-10-CM | POA: Diagnosis not present

## 2022-02-28 ENCOUNTER — Other Ambulatory Visit (HOSPITAL_COMMUNITY): Payer: Self-pay

## 2022-03-01 ENCOUNTER — Other Ambulatory Visit (HOSPITAL_COMMUNITY): Payer: Self-pay

## 2022-03-01 DIAGNOSIS — M25611 Stiffness of right shoulder, not elsewhere classified: Secondary | ICD-10-CM | POA: Diagnosis not present

## 2022-03-01 DIAGNOSIS — M25511 Pain in right shoulder: Secondary | ICD-10-CM | POA: Diagnosis not present

## 2022-03-02 DIAGNOSIS — M25511 Pain in right shoulder: Secondary | ICD-10-CM | POA: Diagnosis not present

## 2022-03-02 DIAGNOSIS — M25611 Stiffness of right shoulder, not elsewhere classified: Secondary | ICD-10-CM | POA: Diagnosis not present

## 2022-03-06 DIAGNOSIS — M25511 Pain in right shoulder: Secondary | ICD-10-CM | POA: Diagnosis not present

## 2022-03-06 DIAGNOSIS — M25611 Stiffness of right shoulder, not elsewhere classified: Secondary | ICD-10-CM | POA: Diagnosis not present

## 2022-03-09 DIAGNOSIS — M25511 Pain in right shoulder: Secondary | ICD-10-CM | POA: Diagnosis not present

## 2022-03-09 DIAGNOSIS — M25611 Stiffness of right shoulder, not elsewhere classified: Secondary | ICD-10-CM | POA: Diagnosis not present

## 2022-03-13 DIAGNOSIS — M25611 Stiffness of right shoulder, not elsewhere classified: Secondary | ICD-10-CM | POA: Diagnosis not present

## 2022-03-13 DIAGNOSIS — M25511 Pain in right shoulder: Secondary | ICD-10-CM | POA: Diagnosis not present

## 2022-03-21 DIAGNOSIS — M25611 Stiffness of right shoulder, not elsewhere classified: Secondary | ICD-10-CM | POA: Diagnosis not present

## 2022-03-21 DIAGNOSIS — M25511 Pain in right shoulder: Secondary | ICD-10-CM | POA: Diagnosis not present

## 2022-03-23 DIAGNOSIS — M25611 Stiffness of right shoulder, not elsewhere classified: Secondary | ICD-10-CM | POA: Diagnosis not present

## 2022-03-23 DIAGNOSIS — M25511 Pain in right shoulder: Secondary | ICD-10-CM | POA: Diagnosis not present

## 2022-04-14 DIAGNOSIS — M79662 Pain in left lower leg: Secondary | ICD-10-CM | POA: Diagnosis not present

## 2022-04-14 DIAGNOSIS — S8001XA Contusion of right knee, initial encounter: Secondary | ICD-10-CM | POA: Diagnosis not present

## 2022-04-14 DIAGNOSIS — M79642 Pain in left hand: Secondary | ICD-10-CM | POA: Diagnosis not present

## 2022-04-24 ENCOUNTER — Other Ambulatory Visit (HOSPITAL_COMMUNITY): Payer: Self-pay

## 2022-04-24 MED ORDER — FLUTICASONE PROPIONATE 50 MCG/ACT NA SUSP
1.0000 | Freq: Every day | NASAL | 1 refills | Status: DC
  Filled 2022-04-24: qty 16, 60d supply, fill #0
  Filled 2022-09-04: qty 16, 60d supply, fill #1

## 2022-04-25 DIAGNOSIS — E291 Testicular hypofunction: Secondary | ICD-10-CM | POA: Diagnosis not present

## 2022-04-25 DIAGNOSIS — Z79890 Hormone replacement therapy (postmenopausal): Secondary | ICD-10-CM | POA: Diagnosis not present

## 2022-04-25 DIAGNOSIS — R5383 Other fatigue: Secondary | ICD-10-CM | POA: Diagnosis not present

## 2022-04-27 DIAGNOSIS — R5383 Other fatigue: Secondary | ICD-10-CM | POA: Diagnosis not present

## 2022-04-27 DIAGNOSIS — Z6828 Body mass index (BMI) 28.0-28.9, adult: Secondary | ICD-10-CM | POA: Diagnosis not present

## 2022-04-27 DIAGNOSIS — E291 Testicular hypofunction: Secondary | ICD-10-CM | POA: Diagnosis not present

## 2022-04-27 DIAGNOSIS — M2550 Pain in unspecified joint: Secondary | ICD-10-CM | POA: Diagnosis not present

## 2022-05-19 DIAGNOSIS — R718 Other abnormality of red blood cells: Secondary | ICD-10-CM | POA: Diagnosis not present

## 2022-05-29 ENCOUNTER — Other Ambulatory Visit (HOSPITAL_COMMUNITY): Payer: Self-pay

## 2022-05-30 ENCOUNTER — Other Ambulatory Visit (HOSPITAL_COMMUNITY): Payer: Self-pay

## 2022-05-30 ENCOUNTER — Other Ambulatory Visit: Payer: Self-pay

## 2022-06-14 ENCOUNTER — Other Ambulatory Visit (HOSPITAL_COMMUNITY): Payer: Self-pay

## 2022-07-03 ENCOUNTER — Ambulatory Visit: Payer: 59 | Admitting: Cardiology

## 2022-07-10 ENCOUNTER — Encounter: Payer: Self-pay | Admitting: Cardiology

## 2022-07-10 ENCOUNTER — Ambulatory Visit: Payer: Commercial Managed Care - PPO | Attending: Cardiology | Admitting: Cardiology

## 2022-07-10 ENCOUNTER — Other Ambulatory Visit (HOSPITAL_COMMUNITY): Payer: Self-pay

## 2022-07-10 VITALS — BP 103/68 | HR 74 | Ht 70.0 in | Wt 207.8 lb

## 2022-07-10 DIAGNOSIS — E785 Hyperlipidemia, unspecified: Secondary | ICD-10-CM | POA: Diagnosis not present

## 2022-07-10 DIAGNOSIS — R42 Dizziness and giddiness: Secondary | ICD-10-CM | POA: Diagnosis not present

## 2022-07-10 DIAGNOSIS — I10 Essential (primary) hypertension: Secondary | ICD-10-CM | POA: Diagnosis not present

## 2022-07-10 DIAGNOSIS — I25709 Atherosclerosis of coronary artery bypass graft(s), unspecified, with unspecified angina pectoris: Secondary | ICD-10-CM

## 2022-07-10 DIAGNOSIS — I2510 Atherosclerotic heart disease of native coronary artery without angina pectoris: Secondary | ICD-10-CM

## 2022-07-10 DIAGNOSIS — Z9861 Coronary angioplasty status: Secondary | ICD-10-CM

## 2022-07-10 DIAGNOSIS — Z951 Presence of aortocoronary bypass graft: Secondary | ICD-10-CM | POA: Diagnosis not present

## 2022-07-10 DIAGNOSIS — I25119 Atherosclerotic heart disease of native coronary artery with unspecified angina pectoris: Secondary | ICD-10-CM

## 2022-07-10 DIAGNOSIS — I2111 ST elevation (STEMI) myocardial infarction involving right coronary artery: Secondary | ICD-10-CM | POA: Diagnosis not present

## 2022-07-10 MED ORDER — LOSARTAN POTASSIUM 25 MG PO TABS
25.0000 mg | ORAL_TABLET | Freq: Every morning | ORAL | 3 refills | Status: DC
  Filled 2022-07-10 – 2022-08-23 (×2): qty 90, 90d supply, fill #0
  Filled 2022-11-25: qty 90, 90d supply, fill #1

## 2022-07-10 MED ORDER — METOPROLOL SUCCINATE ER 50 MG PO TB24
50.0000 mg | ORAL_TABLET | Freq: Every day | ORAL | 3 refills | Status: DC
  Filled 2022-07-10: qty 90, 90d supply, fill #0
  Filled 2022-09-30: qty 90, 90d supply, fill #1

## 2022-07-10 NOTE — Patient Instructions (Addendum)
Medication Instructions:    Change to taking Losartan 25 mg  to the morning.   Decrease Toprol ( Metoprolol succinate ) and  take 50 mg daily    *If you need a refill on your cardiac medications before your next appointment, please call your pharmacy*   Lab Work: Not needed    Testing/Procedures:  Not needed  Follow-Up: At [facility], you and your health needs are our priority.  As part of our continuing mission to provide you with exceptional heart care, we have created designated Provider Care Teams.  These Care Teams include your primary Cardiologist (physician) and Advanced Practice Providers (APPs -  Physician Assistants and Nurse Practitioners) who all work together to provide you with the care you need, when you need it.     Your next appointment:   6 month(s)  The format for your next appointment:   In Person  Provider:   Bryan Lemma, MD    Other Instructions   Keep adequate Hydrated

## 2022-07-10 NOTE — Progress Notes (Signed)
Primary Care Provider: Lupita Raider, MD [facility] Cardiologist: Bryan Lemma, MD Electrophysiologist: None  Clinic Note: Chief Complaint  Patient presents with   Follow-up    6 months.  Doing okay.  Still has exertional dyspnea and fatigue no chest pain   Coronary Artery Disease    No angina   ===================================  ASSESSMENT/PLAN   Problem List Items Addressed This Visit       Cardiology Problems   Inferior and posterior STEMI (ST elevation myocardial infarction) (HCC) (Chronic)    He has had 2 inferior STEMI's 1 with the right coronary artery and 1 with the graft to the right coronary artery.  He is also had other ACS presentations.  He is quite familiar with what his angina is which is the symptom he has during his MI but he also has a lot of exertional fatigue which I am inclined to believe may be microvascular in nature.  Sizable infarct noted on echo with inferior hypokinesis.  Mildly reduced EF.      Relevant Medications   losartan (COZAAR) 25 MG tablet   metoprolol succinate (TOPROL-XL) 50 MG 24 hr tablet   Hyperlipidemia LDL goal <70 (Chronic)    Lipids as of June last year looked great.  Should be due for follow-up labs by PCP in June.      Relevant Medications   losartan (COZAAR) 25 MG tablet   metoprolol succinate (TOPROL-XL) 50 MG 24 hr tablet   Essential hypertension, benign (Chronic)    If anything is borderline hypotensive-not able to tolerate the increase of losartan to 50 mg.  Back to 25 mg losartan which have him take in the morning, and will reduce Toprol to 50 mg because of low blood pressures and fatigue.  He will take this at nighttime.      Relevant Medications   losartan (COZAAR) 25 MG tablet   metoprolol succinate (TOPROL-XL) 50 MG 24 hr tablet   Coronary artery disease involving native coronary artery of native heart with angina pectoris (HCC) - Primary (Chronic)    Thankfully no further angina since his MI.   He is still just having exertional dyspnea which may just be related to use of diastolic function.  Unfortunately his blood pressure is pretty low after having increased his losartan up.  He is actually back down to 25 now.  Plan:  Continue aspirin and Brilinta as discussed. Continue losartan with switch to morning and make Toprol at night with reducing the dose to 50 mg. Continue Ranexa 500 mg twice daily for additional antianginal benefit. Continue combination of Zetia and rosuvastatin. Continue Jardiance.       Relevant Medications   losartan (COZAAR) 25 MG tablet   metoprolol succinate (TOPROL-XL) 50 MG 24 hr tablet   Coronary artery disease involving coronary bypass graft of native heart with angina pectoris (HCC) (Chronic)    Now only has 2 grafts remaining.  I suspect that with the occluded circumflex that there will be competitive flow to that graft and perhaps it will stay open longer.  LIMA seems to be doing well.      Relevant Medications   losartan (COZAAR) 25 MG tablet   metoprolol succinate (TOPROL-XL) 50 MG 24 hr tablet   CAD S/P percutaneous coronary angioplasty (Chronic)    Currently on aspirin and Brilinta for recent MI with now occluded SVG to RPL with previously occluded SVG to diagonal branch.  Distal RCA has been filled via left-to-right collaterals.  Thankfully, no further  angina.  For now continue aspirin and Brilinta, but okay to hold 1 or both preop for surgeries or procedures 5 to 7 days preop.  Low threshold to stop aspirin if he has bruising.  Plan will be to continue Brilinta for a whole year and potentially reduce to maintenance dosing to complete a second near although this can still be interrupted.      Relevant Medications   losartan (COZAAR) 25 MG tablet   metoprolol succinate (TOPROL-XL) 50 MG 24 hr tablet     Other   S/P CABG x 4 (Chronic)    Now is 204 grafts closed.  Only LIMA to LAD and SVG to OM are open.  Continue to monitor.       Orthostatic dizziness (Chronic)    As he tends to have low blood pressures.  We have not been to use ACE inhibitors or ARB's in the past.  This allowed [place] to use low-dose amlodipine but now he is back on ARB and not on the amlodipine. Reduced from 50 to 25 mg of losartan and he still is dizzy.  Organ to have him split his losartan and Toprol dosing where he takes the losartan in the morning and Toprol at night with reduced dose.  Intra-articular hydration and avoid rapid standing.      ===================================  HPI:    Daniel Buck is a 57 y.o. male with a PMH below who presents today for 39-month follow-up. Referring provider: Lupita Raider, MD  Cardiac History 1996: Inferior STEMI-BMS PCI RCA 2002: Recurrent angina-atherectomy and PCI for ISR in RCA 08/22/2019: Recurrent Angina-Cath => MV CAD/LM stenosis => CABG  CABG x4 (LIMA-LAD, SVG-OM, SVG-DX, SVG-RPL) Follow-up 11/26/2019: Noted some increased energy level, but not back to previous baseline.  Able to walk 1.5-2 miles/day without chest pain or dyspnea. 06/28/2020: Worsening fatigue, choking sensation of dyspnea and occasional chest tightness.  Occasional orthostatic dizziness 07/02/2020: Myoview-Intermediate Risk, EF 40%.  Anterolateral ST depression with T wave inversions. => Partially reversible anterolateral and apical defect consistent with infarct and peri-infarct ischemia => Cath 07/07/2020: Potential culprit lesion-occluded SVG to diagonal.  Recommended medical management.  Added Ranexa.  2D echo ordered to assess wall motion 2D Echo 07/16/2020: EF 55 to 60%. Paradoxical septal motion consistent with postop state GR 1 DD.  Normal valves.  Normal RV. No change from 09/13/2017 Inf STEMI 09/19/2021: => Crushing chest pain, substernal radiating to left arm.  Choking sensation.  8/10 symptoms.  Cardiac Cath: Ost-mid LM 60% & dLM 60%, mid LAD 25%<60%ost D1, mLAD 80%; Prox LCx 100% CTO;  Ost-Prox RCA 90%, p-mRCA stent 80% ISR - 100%  dRCA CTO.; Known SVG-D1 CTO. Patent LIMA-LAD, SVG-OM2 -> [place]: New 100% TO of SVG-RPL: Med Rx, concerned that opening up the graft would lead to distal embolization and shutting down the collateralized PL branch.. TTE 09/19/2021: EF 45 to 50%.  Basal inferolateral, basal inferior and basal inferoseptal akinesis.  Mild LVH.  Intermediate diastolic pressures.  Normal MV, AOV.  Moderate RV dysfunction.  Unable to assess PAP.  Normal RAP. => Suggested CT of the chest to see if there is compression from hiatal hernia because of possible pseudo dyskinesis without bulging during systole and diastolic flattening. Follow-up echo showed stable wall motion and reduced EF. Converted from Plavix to Brilinta.    I last saw Daniel Buck in June 2023 prior to his inferior STEMI and he was doing relatively well.  Only complaint was some mild fatigue which in retrospect may  have been pretty anginal equivalent.  He was also just having lots of issues with seasonal allergies.  He was then seen in July for hospital follow-up by Oris Drone.  Repeat echo ordered because of the abnormal findings on the echo done hospitalization. => Recommended tobacco cessation -> After repeat echo, losartan dose increased to 50 mg.  Continued on metoprolol and Jardiance.  Arnav C Comp was seen on January 11, 2022 by Azalee Course, PA for preop assessment for right shoulder surgery.  Denies any recurrent anginal symptoms.  Was able to lift weights on a daily basis without any anginal symptoms prior to his shoulder injury.  Was not able to be as active as he had usually been because of his shoulder injury but we recommended proceeding with surgery.  This morning 4 months out from his MI, okay to hold Brilinta 5 days prior to surgery.  This is partly because he did not actually have PCI at that time.  He had occluded vein graft to posterolateral vessel.  No PCI.  Recent Hospitalizations:  ER visit in November for right shoulder pain.  Reviewed   CV studies:    The following studies were reviewed today: (if available, images/films reviewed: From Epic Chart or Care Everywhere) Cardiac Cath and TTE from June 2023 reviewed above: TTE 12/01/2021: EF remains 45 to 50%.  Severe HK of the basal to mid inferior wall.  Also paradoxical septal motion consistent with postop status.  Mild LA dilation.  RV function normal.  Normal valves.  Mildly elevated RAP.  Interval History:   Daniel Buck returns here today for follow-up overall doing okay.  He seems a little frustrated which is his usual demeanor.  He denies any further chest pain since his MI.  He is frustrated that 2 of his grafts are occluded this close to his surgery. He said that he has some dizziness when he stands up since his losartan dose was increased.  He also notes a little bit of exercise intolerance.  He has palpitations at night where he feels his heart is beating forcefully and hard.  He also notes having easy fatigue during the day still he can get out of breath with minimal or light exertion.  This seems to be a stable thing for him.  No PND, orthopnea or edema.  Although he is having palpitations no prolonged rapid irregular heartbeats.  No syncope or near CP.  No TIA or amaurosis fugax.  No claudication.  He is pretty much back to full level of activity but favors his arm some.  CV Review of Symptoms (Summary):  positive for - dyspnea on exertion, irregular heartbeat, palpitations, and exercise intolerance/easy fatigue; orthostatic dizziness negative for - chest pain, edema, orthopnea, paroxysmal nocturnal dyspnea, shortness of breath, or lightheadedness, syncope/near syncope or TIA/amaurosis fugax, claudication  REVIEWED OF SYSTEMS   Review of Systems  Constitutional:  Positive for malaise/fatigue. Negative for weight loss.  HENT:  Positive for congestion.   Respiratory:  Positive for cough.   Gastrointestinal:  Negative for blood in stool and melena.  Genitourinary:   Negative for frequency and hematuria.  Musculoskeletal:  Positive for joint pain (Right shoulder is feeling better).  Neurological:  Positive for dizziness (Positional/orthostatic). Negative for focal weakness and weakness.  Endo/Heme/Allergies:  Does not bruise/bleed easily.  Psychiatric/Behavioral:  Negative for depression and memory loss. The patient is not nervous/anxious and does not have insomnia.     I have reviewed and (if needed) personally updated the  patient's problem list, medications, allergies, past medical and surgical history, social and family history.   PAST MEDICAL HISTORY   Past Medical History:  Diagnosis Date   Carpal tunnel syndrome on left 12/2011   Chronic nasal congestion    Depression 01/19/2012   GERD (gastroesophageal reflux disease)    Gout    History of Doppler ultrasound    a. Carotid US 3/11: no ICA stenosis    History of echocardiogram    a. Echo 6/14: mod LVH, EF 50-55%, inf-lat HK, Gr 1 DD, mild LAE   History of hiatal hernia    Hx of CABG 09/04/2019   LIMA-LAD, SVG-OM, SVG-Dx, and an SVG-RPLA.   Hyperlipidemia LDL goal < 70    Hypertension    under control; has been on med. since 1996   Multiple vessel coronary artery disease 1996, '02; '21   a. s/p Inf MI (age 7) >>PCI to RCA with tandem BMS (PS 1535); b. LHC 06/2000: pRCA 30-40%, prox/mid stents with 75% ISR, 30-40% at crux in RCA >> PCI: Cutting Balloon atherectomy for ISR; c. 2012 Myoview-nonischemic; d. 07/2019 -cath with progression of disease/LM disease => CABG x4   Myocardial infarct, old 1996   PCI   and 2021 and 6/ 2023   Sleep apnea    mild no cpap   Ulcerative colitis (HCC)     PAST SURGICAL HISTORY   Past Surgical History:  Procedure Laterality Date   CAROTID DOPPLERS  02/2020   R ICA 1-39%.  R CCA <50%.  LICA no evidence of stenosis.  Minimal plaque in extracranial vessels.  Normal bilateral vertebral arteries and subclavian arteries.   CARPAL TUNNEL RELEASE  01/26/2012    Procedure: CARPAL TUNNEL RELEASE;  Surgeon: Wyn Forster., MD;  Location: [facility];  Service: Orthopedics;  Laterality: Left;   CORONARY ANGIOPLASTY  07/11/2000   Cutting Balloon PTCA for RCA ISR   CORONARY ARTERY BYPASS GRAFT N/A 09/04/2019   Procedure: CORONARY ARTERY BYPASS GRAFTING (CABG) using LIMA to LAD; Endoscopic Vein Harvest of Right Greater Saphenous: SVG to Diag1; SVG to OM2; SVG to PL.;  Surgeon: Alleen Borne, MD;  Location: [facility];  Service: Open Heart Surgery;  Laterality: N/A;   CORONARY STENT PLACEMENT  1996   [id number] BMS x 2 in RCA   CYSTECTOMY  as a child   from neck   ENDOVEIN HARVEST OF GREATER SAPHENOUS VEIN Right 09/04/2019   Procedure: ENDOVEIN HARVEST OF GREATER SAPHENOUS VEIN;  Surgeon: Alleen Borne, MD;  Location: [facility] OR;  Service: Open Heart Surgery;  Laterality: Right;  Endovein scope harvest of right upper and lower LE.   HERNIA REPAIR     LEFT HEART CATH AND CORONARY ANGIOGRAPHY N/A 08/22/2019   Procedure: LEFT HEART CATH AND CORONARY ANGIOGRAPHY;  Surgeon: Lyn Records, MD;  Location: [facility];;  Eccentric ostial 40 of 50% LM followed by 55-70% distal LM.  65% midLAD (heavily calcified), ostial D1 80%, LCx 70% between OM1 and OM2, RCA large with diffuse ISR prior PCI in 1996 and 2002-up to 90%.  PDA with competitive flow due to collateral flow from LCA.  EF 50-60%.  LVEDP 17    LEFT HEART CATH AND CORS/GRAFTS ANGIOGRAPHY N/A 07/07/2020   Procedure: LEFT HEART CATH AND CORS/GRAFTS ANGIOGRAPHY;  Surgeon: Marykay Lex, MD;  Location: [facility];; New: 100% RCA CTO, 100% CTO p-m Cx after OM1; CTO SVG-D1 (? Culprit). Stable: Ost-pLM 50%, dLM 60%.  Ost D1 80%, mid LAD 60% (pre LIMA) -> Patent LIMA-LAD. OM1 patent w/ diffuse mild Dz. Patent SVG-OM2, SVG--RPL1 (no Competitive native flow).  EF ~ 45% w/ Inflat HK. (Rec Echo)   LEFT HEART CATH AND CORS/GRAFTS ANGIOGRAPHY N/A 09/19/2021   Procedure: LEFT HEART CATH AND CORS/GRAFTS  ANGIOGRAPHY;  Surgeon: Lennette Bihari, MD;  Location: [facility];  Service: Cardiovascular;  Laterality: N/A;   NM MYOVIEW LTD  02/2011   Inferior infarct but no ischemia   NM MYOVIEW LTD  07/02/2020   EF roughly 40%.  Horizontal ST segment depression in anterolateral leads.  Also T wave inversions noted.  Medium size, partially reversible apical anterior and apical defect concerning for prior infarct with peri-infarct ischemia.  Intermediate risk.  Global HK with septal HK from prior sternotomy.   SHOULDER ARTHROSCOPY WITH SUBACROMIAL DECOMPRESSION AND OPEN ROTATOR C Right 01/25/2022   Procedure: SHOULDER ARTHROSCOPY WITH SUBACROMIAL DECOMPRESSION AND MINI OPEN ROTATOR CUFF REPAIR, OPEN BICEPS TENDOESIS;  Surgeon: Beverely Low, MD;  Location: [facility];  Service: Orthopedics;  Laterality: Right;  120 MIN   TEE WITHOUT CARDIOVERSION N/A 09/04/2019   Procedure: INTRAOPERATIVE TRANSESOPHAGEAL ECHOCARDIOGRAM (TEE);  Surgeon: Alleen Borne, MD;  Location: [facility];  Service: Open Heart Surgery;  Intra-Op TEE: EF 50-55%.  Mild MR normal aortic, pulmonic and tricuspid valves. ->  Stable postop   TONSILLECTOMY  as a child   TRANSTHORACIC ECHOCARDIOGRAM  07/16/2020   EF 55 to 60%.  Paradoxical septal motion c/w Post-OP Sternotomy.  GR 1 DD.  Normal valves.  Normal RV. No change from 09/13/2017   ZIO PATCH EVENT MONITOR  03/2020   Rare PACs and PVCs.  No arrhythmias.  Mostly sinus rhythm rate ranged from 58 to 146 bpm.  Average 81 bpm.   Cardiac Cath June 2023: New culprit lesion in the occluded vein graft to the PL branch.     MEDICATIONS/ALLERGIES   Current Meds  Medication Sig   acetaminophen (TYLENOL) 500 MG tablet Take 1,000 mg by mouth every 6 (six) hours as needed for moderate pain or headache.   allopurinol (ZYLOPRIM) 300 MG tablet Take 1 tablet (300 mg total) by mouth daily.   anastrozole (ARIMIDEX) 1 MG tablet Take 0.5 tablets (0.5 mg total) by mouth once a week.   aspirin EC 81 MG  tablet Take 1 tablet (81 mg total) by mouth daily. Swallow whole.   Cholecalciferol (VITAMIN D) 125 MCG (5000 UT) CAPS Take 5,000 Units by mouth daily.   Cyanocobalamin (VITAMIN B-12) 1000 MCG/15ML LIQD Take 15 mLs by mouth daily.   DHEA 25 MG tablet Take 25 mg by mouth daily.   empagliflozin (JARDIANCE) 10 MG TABS tablet Take 1 tablet (10 mg total) by mouth daily.   esomeprazole (NEXIUM) 40 MG capsule Take 1 capsule (40 mg total) by mouth 2 (two) times daily.   ezetimibe (ZETIA) 10 MG tablet Take 1 tablet (10 mg total) by mouth daily.   fluticasone (FLONASE) 50 MCG/ACT nasal spray Place 1 spray into both nostrils daily.   mesalamine (LIALDA) 1.2 g EC tablet Take 1.2 g by mouth daily with breakfast. Take 1.2g daily, May take an additional 3 tablets as needed (total of 4 Tablets daily)   mesalamine (LIALDA) 1.2 g EC tablet Take 4 tablets (4.8 g total) by mouth once daily.   methocarbamol (ROBAXIN) 500 MG tablet Take 500 mg by mouth daily as needed for muscle spasms.   methocarbamol (ROBAXIN) 500 MG tablet Take 1 tablet (  500 mg total) by mouth every 6 (six) hours as needed for muscle spasms.   metoprolol succinate (TOPROL-XL) 50 MG 24 hr tablet Take 1 tablet (50 mg total) by mouth daily. Take with or immediately following a meal.   NEEDLE, DISP, 18 G 18G X 1" MISC Use as directed   nitroGLYCERIN (NITROSTAT) 0.4 MG SL tablet Place 1 tablet (0.4 mg total) under the tongue every 5 (five) minutes as needed.   ondansetron (ZOFRAN) 4 MG tablet Take 1 tablet (4 mg total) by mouth every 8 (eight) hours as needed for nausea or vomiting.   ondansetron (ZOFRAN) 4 MG tablet Take 1 tablet (4 mg total) by mouth every 8 (eight) hours as needed for refractory vomiting or nausea.   oxyCODONE-acetaminophen (PERCOCET) 5-325 MG tablet Take 1 tablet by mouth every 4 (four) hours as needed for severe pain.   ranolazine (RANEXA) 500 MG 12 hr tablet Take 1 tablet (500 mg total) by mouth 2 (two) times daily.   rosuvastatin  (CRESTOR) 40 MG tablet Take 1 tablet (40 mg total) by mouth daily.   SYRINGE-NEEDLE, DISP, 3 ML (B-D 3CC LUER-LOK SYR 25GX1") 25G X 1" 3 ML MISC Use as directed   tadalafil (CIALIS) 5 MG tablet Take 1 tablet (5 mg total) by mouth daily as needed for [facility] and ED   ticagrelor (BRILINTA) 90 MG TABS tablet Take 1 tablet (90 mg total) by mouth 2 (two) times daily.   Vilazodone HCl (VIIBRYD) 40 MG TABS Take 1 tablet (40 mg total) by mouth daily with food.   [DISCONTINUED] losartan (COZAAR) 25 MG tablet Take 1 tablet (25 mg total) by mouth at bedtime.   [DISCONTINUED] metoprolol succinate (TOPROL-XL) 100 MG 24 hr tablet Take 1 tablet (100 mg total) by mouth daily. Take with or immediately following a meal.    Allergies  Allergen Reactions   Pantoprazole Other (See Comments)    GI upset    SOCIAL HISTORY/FAMILY HISTORY   Reviewed in Epic:  Pertinent findings:  Social History   Tobacco Use   Smoking status: Former    Types: Cigarettes    Quit date: 1994    Years since quitting: 30.3   Smokeless tobacco: Current    Types: Snuff  Vaping Use   Vaping Use: Never used  Substance Use Topics   Alcohol use: No   Drug use: No   Social History   Social History Narrative   He lives in [place], [place] near [place] with his wife. [place].  Near the former Dr. Alanda Amass takes houses his horses.   At baseline, he is extremely active around the farm, but does not have a routine exercise regimen.   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   He quit smoking in 1994.    OBJCTIVE -PE, EKG, labs   Wt Readings from Last 3 Encounters:  07/10/22 207 lb 12.8 oz (94.3 kg)  01/25/22 187 lb (84.8 kg)  01/11/22 192 lb 6.4 oz (87.3 kg)    Physical Exam: BP 103/68   Pulse 74   Ht 5\' 10"  (1.778 m)   Wt 207 lb 12.8 oz (94.3 kg)   SpO2 97%   BMI 29.82 kg/m  Physical Exam Vitals reviewed.  Constitutional:      General: He is not in acute distress.    Appearance: Normal appearance.  He is normal weight.     Comments: Borderline obese, but more because of his more muscular stature.  Well-groomed.  Healthy-appearing.  HENT:     Head: Normocephalic and atraumatic.  Neck:     Vascular: No carotid bruit.  Cardiovascular:     Rate and Rhythm: Normal rate and regular rhythm.     Pulses: Normal pulses.     Heart sounds: Normal heart sounds. No murmur heard.    No friction rub. No gallop.  Pulmonary:     Effort: Pulmonary effort is normal. No respiratory distress.     Breath sounds: Normal breath sounds. No wheezing, rhonchi or rales.  Chest:     Chest wall: No tenderness.  Musculoskeletal:        General: No swelling. Normal range of motion.     Cervical back: Normal range of motion and neck supple.  Skin:    General: Skin is warm and dry.  Neurological:     General: No focal deficit present.     Mental Status: He is alert and oriented to person, place, and time. Mental status is at baseline.     Gait: Gait normal.  Psychiatric:        Thought Content: Thought content normal.        Judgment: Judgment normal.     Comments: As usual, seems a little irritated.     Adult ECG Report Not checked  Recent Labs: Reviewed Lab Results  Component Value Date   CHOL 83 09/19/2021   HDL 27 (L) 09/19/2021   LDLCALC 29 09/19/2021   TRIG 136 09/19/2021   CHOLHDL 3.1 09/19/2021   Lab Results  Component Value Date   CREATININE 1.02 01/25/2022   BUN 18 01/25/2022   NA 136 01/25/2022   K 3.7 01/25/2022   CL 105 01/25/2022   CO2 23 01/25/2022      Latest Ref Rng & Units 01/25/2022   10:55 AM 09/21/2021    2:04 AM 09/20/2021    5:54 AM  CBC  WBC 4.0 - 10.5 K/uL 10.3  13.4  12.2   Hemoglobin 13.0 - 17.0 g/dL 69.6  29.5  28.4   Hematocrit 39.0 - 52.0 % 49.6  46.1  46.1   Platelets 150 - 400 K/uL 249  216  241     Lab Results  Component Value Date   HGBA1C 5.8 (H) 01/25/2022   Lab Results  Component Value Date   TSH 2.652 06/14/2016     ================================================== I spent a total of 22 minutes with the patient spent in direct patient consultation.  Additional time spent with chart review  / charting (studies, outside notes, etc): 26 min => Several clinic visits, 2 echoes and cath films are reviewed. Total Time: 48 min  Current medicines are reviewed at length with the patient today.  (+/- concerns) N/A  Notice: This dictation was prepared with Dragon dictation along with smart phrase technology. Any transcriptional errors that result from this process are unintentional and may not be corrected upon review.  Studies Ordered:   No orders of the defined types were placed in this encounter.  Meds ordered this encounter  Medications   losartan (COZAAR) 25 MG tablet    Sig: Take 1 tablet (25 mg total) by mouth every morning.    Dispense:  90 tablet    Refill:  3    Quantity change and direction changed   metoprolol succinate (TOPROL-XL) 50 MG 24 hr tablet    Sig: Take 1 tablet (50 mg total) by mouth daily. Take with or immediately following a meal.    Dispense:  90 tablet    Refill:  3    Patient Instructions / Medication Changes & Studies & Tests Ordered   Patient Instructions  Medication Instructions:    Change to taking Losartan 25 mg  to the morning.   Decrease Toprol ( Metoprolol succinate ) and  take 50 mg daily    *If you need a refill on your cardiac medications before your next appointment, please call your pharmacy*   Lab Work: Not needed    Testing/Procedures:  Not needed  Follow-Up: At [facility], you and your health needs are our priority.  As part of our continuing mission to provide you with exceptional heart care, we have created designated Provider Care Teams.  These Care Teams include your primary Cardiologist (physician) and Advanced Practice Providers (APPs -  Physician Assistants and Nurse Practitioners) who all work together to provide you with the  care you need, when you need it.     Your next appointment:   6 month(s)  The format for your next appointment:   In Person  Provider:   Bryan Lemma, MD    Other Instructions   Keep adequate Hydrated      Marykay Lex, MD, MS Bryan Lemma, M.D., M.S. Interventional Cardiologist  [facility]  Pager # [phone] Phone # [phone] [address] [place], [place] [address]   Thank you for choosing [facility] at [place]!!

## 2022-07-22 ENCOUNTER — Other Ambulatory Visit: Payer: Self-pay | Admitting: Cardiology

## 2022-07-22 ENCOUNTER — Encounter: Payer: Self-pay | Admitting: Cardiology

## 2022-07-22 NOTE — Assessment & Plan Note (Signed)
Thankfully no further angina since his MI.  He is still just having exertional dyspnea which may just be related to use of diastolic function.  Unfortunately his blood pressure is pretty low after having increased his losartan up.  He is actually back down to 25 now.  Plan:  Continue aspirin and Brilinta as discussed. Continue losartan with switch to morning and make Toprol at night with reducing the dose to 50 mg. Continue Ranexa 500 mg twice daily for additional antianginal benefit. Continue combination of Zetia and rosuvastatin. Continue Jardiance.

## 2022-07-22 NOTE — Assessment & Plan Note (Signed)
Currently on aspirin and Brilinta for recent MI with now occluded SVG to RPL with previously occluded SVG to diagonal branch.  Distal RCA has been filled via left-to-right collaterals.  Thankfully, no further angina.  For now continue aspirin and Brilinta, but okay to hold 1 or both preop for surgeries or procedures 5 to 7 days preop.  Low threshold to stop aspirin if he has bruising.  Plan will be to continue Brilinta for a whole year and potentially reduce to maintenance dosing to complete a second near although this can still be interrupted.

## 2022-07-22 NOTE — Assessment & Plan Note (Signed)
He has had 2 inferior STEMI's 1 with the right coronary artery and 1 with the graft to the right coronary artery.  He is also had other ACS presentations.  He is quite familiar with what his angina is which is the symptom he has during his MI but he also has a lot of exertional fatigue which I am inclined to believe may be microvascular in nature.  Sizable infarct noted on echo with inferior hypokinesis.  Mildly reduced EF.

## 2022-07-22 NOTE — Assessment & Plan Note (Signed)
If anything is borderline hypotensive-not able to tolerate the increase of losartan to 50 mg.  Back to 25 mg losartan which have him take in the morning, and will reduce Toprol to 50 mg because of low blood pressures and fatigue.  He will take this at nighttime.

## 2022-07-22 NOTE — Assessment & Plan Note (Signed)
Now is 204 grafts closed.  Only LIMA to LAD and SVG to OM are open.  Continue to monitor.

## 2022-07-22 NOTE — Assessment & Plan Note (Signed)
Lipids as of June last year looked great.  Should be due for follow-up labs by PCP in June.

## 2022-07-22 NOTE — Assessment & Plan Note (Signed)
Now only has 2 grafts remaining.  I suspect that with the occluded circumflex that there will be competitive flow to that graft and perhaps it will stay open longer.  LIMA seems to be doing well.

## 2022-07-22 NOTE — Assessment & Plan Note (Signed)
As he tends to have low blood pressures.  We have not been to use ACE inhibitors or ARB's in the past.  This allowed [place] to use low-dose amlodipine but now he is back on ARB and not on the amlodipine. Reduced from 50 to 25 mg of losartan and he still is dizzy.  Organ to have him split his losartan and Toprol dosing where he takes the losartan in the morning and Toprol at night with reduced dose.  Intra-articular hydration and avoid rapid standing.

## 2022-07-24 ENCOUNTER — Other Ambulatory Visit (HOSPITAL_COMMUNITY): Payer: Self-pay

## 2022-07-24 MED ORDER — ALPRAZOLAM 0.25 MG PO TABS
0.2500 mg | ORAL_TABLET | Freq: Two times a day (BID) | ORAL | 0 refills | Status: DC | PRN
  Filled 2022-07-24: qty 20, 10d supply, fill #0

## 2022-07-24 MED ORDER — RANOLAZINE ER 500 MG PO TB12
500.0000 mg | ORAL_TABLET | Freq: Two times a day (BID) | ORAL | 3 refills | Status: DC
  Filled 2022-07-24: qty 180, 90d supply, fill #0
  Filled 2022-10-08: qty 180, 90d supply, fill #1
  Filled 2023-01-14 – 2023-01-28 (×2): qty 180, 90d supply, fill #2
  Filled 2023-04-15: qty 180, 90d supply, fill #3

## 2022-07-24 MED ORDER — EZETIMIBE 10 MG PO TABS
10.0000 mg | ORAL_TABLET | Freq: Every day | ORAL | 3 refills | Status: DC
  Filled 2022-07-24: qty 90, 90d supply, fill #0
  Filled 2022-12-02: qty 90, 90d supply, fill #1
  Filled 2023-02-25: qty 90, 90d supply, fill #2

## 2022-07-25 DIAGNOSIS — Z79890 Hormone replacement therapy (postmenopausal): Secondary | ICD-10-CM | POA: Diagnosis not present

## 2022-07-25 DIAGNOSIS — D649 Anemia, unspecified: Secondary | ICD-10-CM | POA: Diagnosis not present

## 2022-07-25 DIAGNOSIS — E291 Testicular hypofunction: Secondary | ICD-10-CM | POA: Diagnosis not present

## 2022-07-27 DIAGNOSIS — E291 Testicular hypofunction: Secondary | ICD-10-CM | POA: Diagnosis not present

## 2022-07-27 DIAGNOSIS — Z79890 Hormone replacement therapy (postmenopausal): Secondary | ICD-10-CM | POA: Diagnosis not present

## 2022-07-27 DIAGNOSIS — M2550 Pain in unspecified joint: Secondary | ICD-10-CM | POA: Diagnosis not present

## 2022-07-27 DIAGNOSIS — Z6828 Body mass index (BMI) 28.0-28.9, adult: Secondary | ICD-10-CM | POA: Diagnosis not present

## 2022-07-27 DIAGNOSIS — R5383 Other fatigue: Secondary | ICD-10-CM | POA: Diagnosis not present

## 2022-08-03 ENCOUNTER — Other Ambulatory Visit (HOSPITAL_COMMUNITY): Payer: Self-pay

## 2022-08-03 MED ORDER — ANASTROZOLE 1 MG PO TABS
0.5000 mg | ORAL_TABLET | ORAL | 1 refills | Status: DC
  Filled 2022-08-03 – 2022-08-04 (×2): qty 6, 84d supply, fill #0
  Filled 2022-09-18: qty 6, 84d supply, fill #1

## 2022-08-04 ENCOUNTER — Other Ambulatory Visit (HOSPITAL_COMMUNITY): Payer: Self-pay

## 2022-08-11 ENCOUNTER — Other Ambulatory Visit (HOSPITAL_COMMUNITY): Payer: Self-pay

## 2022-08-16 ENCOUNTER — Other Ambulatory Visit: Payer: Self-pay

## 2022-08-22 DIAGNOSIS — Z79890 Hormone replacement therapy (postmenopausal): Secondary | ICD-10-CM | POA: Diagnosis not present

## 2022-08-22 DIAGNOSIS — E291 Testicular hypofunction: Secondary | ICD-10-CM | POA: Diagnosis not present

## 2022-08-22 DIAGNOSIS — R5383 Other fatigue: Secondary | ICD-10-CM | POA: Diagnosis not present

## 2022-08-23 ENCOUNTER — Other Ambulatory Visit: Payer: Self-pay | Admitting: Cardiovascular Disease

## 2022-08-23 ENCOUNTER — Other Ambulatory Visit (HOSPITAL_COMMUNITY): Payer: Self-pay

## 2022-08-23 DIAGNOSIS — K219 Gastro-esophageal reflux disease without esophagitis: Secondary | ICD-10-CM | POA: Diagnosis not present

## 2022-08-23 DIAGNOSIS — N529 Male erectile dysfunction, unspecified: Secondary | ICD-10-CM | POA: Diagnosis not present

## 2022-08-23 DIAGNOSIS — I2510 Atherosclerotic heart disease of native coronary artery without angina pectoris: Secondary | ICD-10-CM | POA: Diagnosis not present

## 2022-08-23 DIAGNOSIS — R7301 Impaired fasting glucose: Secondary | ICD-10-CM | POA: Diagnosis not present

## 2022-08-23 DIAGNOSIS — F411 Generalized anxiety disorder: Secondary | ICD-10-CM | POA: Diagnosis not present

## 2022-08-23 DIAGNOSIS — N183 Chronic kidney disease, stage 3 unspecified: Secondary | ICD-10-CM | POA: Diagnosis not present

## 2022-08-23 DIAGNOSIS — K5190 Ulcerative colitis, unspecified, without complications: Secondary | ICD-10-CM | POA: Diagnosis not present

## 2022-08-23 DIAGNOSIS — I130 Hypertensive heart and chronic kidney disease with heart failure and stage 1 through stage 4 chronic kidney disease, or unspecified chronic kidney disease: Secondary | ICD-10-CM | POA: Diagnosis not present

## 2022-08-23 DIAGNOSIS — E7800 Pure hypercholesterolemia, unspecified: Secondary | ICD-10-CM | POA: Diagnosis not present

## 2022-08-23 DIAGNOSIS — Z0000 Encounter for general adult medical examination without abnormal findings: Secondary | ICD-10-CM | POA: Diagnosis not present

## 2022-08-23 LAB — LAB REPORT - SCANNED
A1c: 6
EGFR: 63

## 2022-08-23 MED ORDER — BUPROPION HCL ER (XL) 150 MG PO TB24
150.0000 mg | ORAL_TABLET | Freq: Every morning | ORAL | 3 refills | Status: DC
  Filled 2022-08-23: qty 30, 30d supply, fill #0

## 2022-08-23 MED ORDER — ESOMEPRAZOLE MAGNESIUM 40 MG PO CPDR
40.0000 mg | DELAYED_RELEASE_CAPSULE | Freq: Two times a day (BID) | ORAL | 3 refills | Status: DC
  Filled 2022-09-18 – 2022-11-16 (×3): qty 180, 90d supply, fill #0
  Filled 2023-02-13: qty 180, 90d supply, fill #1
  Filled 2023-05-14: qty 180, 90d supply, fill #2
  Filled 2023-08-11: qty 180, 90d supply, fill #3

## 2022-08-24 ENCOUNTER — Other Ambulatory Visit (HOSPITAL_COMMUNITY): Payer: Self-pay

## 2022-08-24 MED ORDER — EMPAGLIFLOZIN 10 MG PO TABS
10.0000 mg | ORAL_TABLET | Freq: Every day | ORAL | 3 refills | Status: DC
  Filled 2022-08-24: qty 90, 90d supply, fill #0
  Filled 2022-11-25: qty 90, 90d supply, fill #1
  Filled 2023-02-25: qty 90, 90d supply, fill #2
  Filled 2023-06-11: qty 90, 90d supply, fill #3

## 2022-08-24 MED ORDER — TICAGRELOR 90 MG PO TABS
90.0000 mg | ORAL_TABLET | Freq: Two times a day (BID) | ORAL | 3 refills | Status: DC
  Filled 2022-08-24: qty 180, 90d supply, fill #0
  Filled 2022-12-02: qty 180, 90d supply, fill #1

## 2022-08-24 MED ORDER — ALLOPURINOL 300 MG PO TABS
300.0000 mg | ORAL_TABLET | Freq: Every day | ORAL | 3 refills | Status: DC
  Filled 2022-08-24: qty 90, 90d supply, fill #0
  Filled 2022-11-25: qty 90, 90d supply, fill #1
  Filled 2023-02-25: qty 90, 90d supply, fill #2
  Filled 2023-05-29: qty 90, 90d supply, fill #3

## 2022-08-24 MED ORDER — VILAZODONE HCL 40 MG PO TABS
40.0000 mg | ORAL_TABLET | Freq: Every day | ORAL | 3 refills | Status: DC
  Filled 2022-08-24: qty 90, 90d supply, fill #0
  Filled 2022-11-25: qty 90, 90d supply, fill #1
  Filled 2023-02-25: qty 90, 90d supply, fill #2
  Filled 2023-05-29: qty 90, 90d supply, fill #3

## 2022-08-24 MED ORDER — ASPIRIN 81 MG PO TBEC
81.0000 mg | DELAYED_RELEASE_TABLET | Freq: Every day | ORAL | 3 refills | Status: DC
  Filled 2022-08-24: qty 90, 90d supply, fill #0
  Filled 2022-11-11 – 2022-12-02 (×2): qty 90, 90d supply, fill #1

## 2022-08-25 NOTE — Progress Notes (Signed)
Lab results look pretty good!!  08/23/2022 Na+ 140, K+ 4.1, Cl- 103, HCO3-30, BUN 23, Cr 1.31 (a little elevated but stable), Glu 82, Ca2+ 10.1; AST 37, ALT 25, AlkP 57 TC 113, TG 124, HDL 32, LDL 59; hemoglobin A1c 6.0.   Bryan Lemma, MD

## 2022-09-19 ENCOUNTER — Other Ambulatory Visit: Payer: Self-pay

## 2022-09-26 ENCOUNTER — Other Ambulatory Visit (HOSPITAL_COMMUNITY): Payer: Self-pay

## 2022-09-26 MED ORDER — ANASTROZOLE 1 MG PO TABS
0.5000 mg | ORAL_TABLET | ORAL | 3 refills | Status: DC
  Filled 2022-09-26: qty 4, 28d supply, fill #0
  Filled 2022-11-16: qty 4, 28d supply, fill #1
  Filled 2023-01-14: qty 4, 28d supply, fill #2
  Filled 2023-03-11: qty 4, 28d supply, fill #3

## 2022-09-27 ENCOUNTER — Other Ambulatory Visit (HOSPITAL_COMMUNITY): Payer: Self-pay

## 2022-09-30 ENCOUNTER — Other Ambulatory Visit (HOSPITAL_COMMUNITY): Payer: Self-pay

## 2022-10-02 ENCOUNTER — Other Ambulatory Visit (HOSPITAL_COMMUNITY): Payer: Self-pay

## 2022-10-02 MED ORDER — FLUTICASONE PROPIONATE 50 MCG/ACT NA SUSP
1.0000 | Freq: Every day | NASAL | 1 refills | Status: DC
  Filled 2022-10-02 – 2023-01-05 (×2): qty 16, 60d supply, fill #0
  Filled 2023-03-11: qty 16, 60d supply, fill #1

## 2022-10-30 DIAGNOSIS — E291 Testicular hypofunction: Secondary | ICD-10-CM | POA: Diagnosis not present

## 2022-10-30 DIAGNOSIS — Z79890 Hormone replacement therapy (postmenopausal): Secondary | ICD-10-CM | POA: Diagnosis not present

## 2022-10-30 DIAGNOSIS — R5383 Other fatigue: Secondary | ICD-10-CM | POA: Diagnosis not present

## 2022-11-01 DIAGNOSIS — R6882 Decreased libido: Secondary | ICD-10-CM | POA: Diagnosis not present

## 2022-11-01 DIAGNOSIS — M2550 Pain in unspecified joint: Secondary | ICD-10-CM | POA: Diagnosis not present

## 2022-11-01 DIAGNOSIS — E291 Testicular hypofunction: Secondary | ICD-10-CM | POA: Diagnosis not present

## 2022-11-01 DIAGNOSIS — Z6829 Body mass index (BMI) 29.0-29.9, adult: Secondary | ICD-10-CM | POA: Diagnosis not present

## 2022-11-13 ENCOUNTER — Other Ambulatory Visit: Payer: Self-pay

## 2022-11-16 ENCOUNTER — Other Ambulatory Visit (HOSPITAL_COMMUNITY): Payer: Self-pay

## 2022-11-17 ENCOUNTER — Other Ambulatory Visit (HOSPITAL_COMMUNITY): Payer: Self-pay

## 2022-11-28 ENCOUNTER — Other Ambulatory Visit: Payer: Self-pay

## 2022-12-04 ENCOUNTER — Other Ambulatory Visit: Payer: Self-pay

## 2022-12-04 ENCOUNTER — Encounter: Payer: Self-pay | Admitting: Cardiology

## 2022-12-04 ENCOUNTER — Other Ambulatory Visit (HOSPITAL_COMMUNITY): Payer: Self-pay

## 2022-12-04 MED ORDER — METOPROLOL SUCCINATE ER 100 MG PO TB24
100.0000 mg | ORAL_TABLET | Freq: Every day | ORAL | 3 refills | Status: DC
  Filled 2022-12-04: qty 90, 90d supply, fill #0
  Filled 2023-02-25: qty 90, 90d supply, fill #1
  Filled 2023-05-29: qty 90, 90d supply, fill #2
  Filled 2023-09-05: qty 90, 90d supply, fill #3

## 2022-12-28 ENCOUNTER — Telehealth: Payer: Self-pay | Admitting: Cardiology

## 2022-12-28 NOTE — Telephone Encounter (Signed)
Spoke with wife per DPR and she states that patient has been experiencing SOB with exertion and while relaxing. Denies any chest pain, edema, headache, nausea or vomiting.  She sates the SOB has been getting worse over the last few weeks. Appointment made with APP. Discussed ED precautions.

## 2022-12-28 NOTE — Telephone Encounter (Signed)
Pt c/o Shortness Of Breath: STAT if SOB developed within the last 24 hours or pt is noticeably SOB on the phone  1. Are you currently SOB (can you hear that pt is SOB on the phone)? No  2. How long have you been experiencing SOB? A couple weeks  3. Are you SOB when sitting or when up moving around? Either   4. Are you currently experiencing any other symptoms? No

## 2022-12-29 NOTE — Telephone Encounter (Signed)
Thnx - will look forward to APP note [facility]

## 2022-12-31 NOTE — Progress Notes (Deleted)
Cardiology Clinic Note   Date: 12/31/2022 ID: JHARED BIZON, DOB [birth year], MRN [id number]  Primary Cardiologist:  Bryan Lemma, MD  Patient Profile    Daniel Buck is a 57 y.o. male who presents to the clinic today for ***    Past medical history significant for: CAD. LHC June 1996 (inferior wall MI): Tandem BMS proximal to mid RCA. LHC October 1996: In-stent restenosis with repeat dilatation. LHC 07/11/2000 (angina): 50 to 75% in-stent restenosis proximal to mid RCA tandem stents, 30 to 40% smooth narrowing distal RCA.  PCI with Cutting Balloon angioplasty. LHC 08/22/2019 (angina): Ostial left main 40 to 45% followed by 55 to 70% distal left main.  Mid LAD 65%.  Ostial to proximal D1 80%.  LCx 70% between OM1 and OM 2.  RCA with diffuse in-stent restenosis within BMS (placed in 1996 and retreated in 2002), stenosis up to 90%.  PDA is competitive flow due to collateralization from LAD.  Recommend CTS consult. CABG x 4 09/04/2019: LIMA to LAD.  SVG to diagonal.  SVG to OM.  SVG to PL (RCA). LHC 07/07/2020 (chest pain): Severe native vessel disease. 100% % occluded RCA (new), 100% proximal-mid LCx after OM1, stable ostial/proximal LM 50% and distal LM 60 to 65%; ostial D1 likely 70% stenosis, mid LAD 65% stenosis.  Patent LIMA to LAD, SVG to OM 2, SVG to RPL.  Occluded SVG-Diagonal -> new, likely culprit lesion. LHC 09/19/2021 (STEMI): Occlusion of proximal SVG to PDA. With the patient having excellent collateralization to the SVG-RCA vein graft territory, the decision was made to treat medically and not risk opening the vein graft with potential distal embolization of clot jeopardizing the recently developed collaterals.  Recommend considering changing from Plavix to Brilinta. 12/01/2021: EF 45 to 50%.  Severe hypokinesis of left ventricular, basal-mid inferior wall.  Normal diastolic parameters.  Mildly reduced RV function.  Mild LAE.  Trivial MR.  Mild dilatation of ascending aorta 39  mm. Hypertension. Hyperlipidemia. Lipid panel 08/23/2022: LDL 59, HDL 32, TG 124, total 113. OSA. GERD.     History of Present Illness    Daniel Buck is followed by Dr. Herbie Baltimore for the above outlined history.  Patient was initially followed by Dr. Alanda Amass.  He established care with Dr. Herbie Baltimore on 08/28/2013 after Dr. Alanda Amass retired.  In summary, patient has a history of significant CAD.  He suffered STEMI at age 48 requiring overlapping BMS to RCA in 1996.  In October 1996 he had in-stent restenosis with repeat dilatation.  In April 2002 he again had in-stent restenosis and underwent PCI with Cutting Balloon angioplasty.  In May 2021 he was found to have extensive CAD (as detailed above) and subsequently underwent CABG x 4.  In April 2022 he continued to have chest pain and was found to have an occluded SVG to diagonal treated medically (further details above).  In June 2023 he had a STEMI with heart catheterization showing occlusion of proximal SVG to PDA.  He was changed from Plavix to Brilinta at that time.  Echo September 2023 showed EF 45 to 50%, severe hypokinesis of left ventricle basal to mid inferior wall, normal diastolic parameters.  Patient was last seen in the office by Dr. Herbie Baltimore on 07/10/2022 for routine follow-up.  He complained of some positional dizziness and losartan was decreased to 25 mg.  He was instructed to split dose of losartan and metoprolol (taking losartan in the a.m. and metoprolol in the p.m.).  Patient contacted the  office on 12/28/2022 with complaints of shortness of breath.  Per triage LPN: Spoke with wife per DPR and she states that patient has been experiencing SOB with exertion and while relaxing. Denies any chest pain, edema, headache, nausea or vomiting.  She sates the SOB has been getting worse over the last few weeks.   Today, patient ***  CAD: Tandem BMS to proximal to mid RCA 1996 with in-stent restenosis x 2 in 1996 and 2002.  S/p CABG x 29 August 2019  with 2/4 patent grafts as of last heart catheterization June 2023.  Continue aspirin, Brilinta, Toprol, rosuvastatin, Ranexa, as needed SL NTG.   ROS: All other systems reviewed and are otherwise negative except as noted in History of Present Illness.  Studies Reviewed       ***  Risk Assessment/Calculations    {Does this patient have ATRIAL FIBRILLATION?:[phone]} No BP recorded.  {Refresh Note OR Click here to enter BP  :1}***        Physical Exam    VS:  There were no vitals taken for this visit. , BMI There is no height or weight on file to calculate BMI.  GEN: Well nourished, well developed, in no acute distress. Neck: No JVD or carotid bruits. Cardiac: *** RRR. No murmurs. No rubs or gallops.   Respiratory:  Respirations regular and unlabored. Clear to auscultation without rales, wheezing or rhonchi. GI: Soft, nontender, nondistended. Extremities: Radials/DP/PT 2+ and equal bilaterally. No clubbing or cyanosis. No edema ***  Skin: Warm and dry, no rash. Neuro: Strength intact.  Assessment & Plan   ***  Disposition: ***     {Are you ordering a CV Procedure (e.g. stress test, cath, DCCV, TEE, etc)?   Press F2        :[id number]}   Signed, Etta Grandchild. Sabrena Gavitt, DNP, NP-C

## 2023-01-02 ENCOUNTER — Ambulatory Visit: Payer: Commercial Managed Care - PPO | Admitting: Student

## 2023-01-05 ENCOUNTER — Other Ambulatory Visit (HOSPITAL_COMMUNITY): Payer: Self-pay

## 2023-01-08 ENCOUNTER — Ambulatory Visit: Payer: Commercial Managed Care - PPO | Attending: Student | Admitting: Nurse Practitioner

## 2023-01-08 ENCOUNTER — Other Ambulatory Visit (HOSPITAL_COMMUNITY): Payer: Self-pay

## 2023-01-08 ENCOUNTER — Encounter: Payer: Self-pay | Admitting: Nurse Practitioner

## 2023-01-08 VITALS — BP 120/66 | HR 72 | Ht 71.0 in | Wt 208.0 lb

## 2023-01-08 DIAGNOSIS — I25110 Atherosclerotic heart disease of native coronary artery with unstable angina pectoris: Secondary | ICD-10-CM

## 2023-01-08 DIAGNOSIS — R0602 Shortness of breath: Secondary | ICD-10-CM

## 2023-01-08 DIAGNOSIS — E785 Hyperlipidemia, unspecified: Secondary | ICD-10-CM

## 2023-01-08 DIAGNOSIS — I10 Essential (primary) hypertension: Secondary | ICD-10-CM | POA: Diagnosis not present

## 2023-01-08 DIAGNOSIS — I25118 Atherosclerotic heart disease of native coronary artery with other forms of angina pectoris: Secondary | ICD-10-CM | POA: Diagnosis not present

## 2023-01-08 MED ORDER — ISOSORBIDE MONONITRATE ER 30 MG PO TB24
15.0000 mg | ORAL_TABLET | Freq: Every day | ORAL | 3 refills | Status: DC
  Filled 2023-01-08: qty 45, 90d supply, fill #0

## 2023-01-08 NOTE — Patient Instructions (Signed)
Medication Instructions:  Start Imdur 15 mg daily  *If you need a refill on your cardiac medications before your next appointment, please call your pharmacy*   Lab Work: BMET & CBC today    Testing/Procedures: Your physician has requested that you have a cardiac catheterization. Cardiac catheterization is used to diagnose and/or treat various heart conditions. Doctors may recommend this procedure for a number of different reasons. The most common reason is to evaluate chest pain. Chest pain can be a symptom of coronary artery disease (CAD), and cardiac catheterization can show whether plaque is narrowing or blocking your heart's arteries. This procedure is also used to evaluate the valves, as well as measure the blood flow and oxygen levels in different parts of your heart. For further information please visit [email or web address] Please follow instruction sheet, as given.    Follow-Up: At [facility], you and your health needs are our priority.  As part of our continuing mission to provide you with exceptional heart care, we have created designated Provider Care Teams.  These Care Teams include your primary Cardiologist (physician) and Advanced Practice Providers (APPs -  Physician Assistants and Nurse Practitioners) who all work together to provide you with the care you need, when you need it.  We recommend signing up for the patient portal called "MyChart".  Sign up information is provided on this After Visit Summary.  MyChart is used to connect with patients for Virtual Visits (Telemedicine).  Patients are able to view lab/test results, encounter notes, upcoming appointments, etc.  Non-urgent messages can be sent to your provider as well.   To learn more about what you can do with MyChart, go to [email or web address].    Your next appointment:    2 weeks post cath  Provider:   Bernadene Person NP (on a day when Dr. Herbie Baltimore is here in office)    OTHER INSTRUCTIONS  [place]  [facility] A DEPT OF [place] AT [facility] [address] [place] [address] Dept: [phone] Loc: [phone]  Daniel Buck  01/08/2023  You are scheduled for a Cardiac Catheterization on Wednesday, October 16 with Dr. Bryan Lemma.  1. Please arrive at the [facility] (Main Entrance A) at [facility]: [address] [place], [place] [address] at 12:30 PM (This time is 2 hour(s) before your procedure to ensure your preparation). Free valet parking service is available. You will check in at ADMITTING. The support person will be asked to wait in the waiting room.  It is OK to have someone drop you off and come back when you are ready to be discharged.    Special note: Every effort is made to have your procedure done on time. Please understand that emergencies sometimes delay scheduled procedures.  2. Diet: Do not eat solid foods after midnight.  The patient may have clear liquids until 5am upon the day of the procedure.  3. Labs: COMPLETED TODAY 01/08/23 4. Medication instructions in preparation for your procedure:   Contrast Allergy: No   Current Outpatient Medications (Endocrine & Metabolic):    empagliflozin (JARDIANCE) 10 MG TABS tablet, Take 1 tablet (10 mg total) by mouth daily.  Current Outpatient Medications (Cardiovascular):    ezetimibe (ZETIA) 10 MG tablet, Take 1 tablet (10 mg total) by mouth daily.   losartan (COZAAR) 25 MG tablet, Take 1 tablet (25 mg total) by mouth every morning.   metoprolol succinate (TOPROL-XL) 100 MG 24 hr tablet,  Take 1 tablet (100 mg total) by mouth daily. Take with or immediately following a meal.   nitroGLYCERIN (NITROSTAT) 0.4 MG SL tablet, Place 1 tablet (0.4 mg total) under the tongue every 5 (five) minutes as needed.   ranolazine (RANEXA) 500 MG 12 hr tablet, Take 1 tablet (500 mg total) by mouth 2 (two) times daily.   rosuvastatin (CRESTOR) 40 MG tablet, Take 1  tablet (40 mg total) by mouth daily.   tadalafil (CIALIS) 5 MG tablet, Take 1 tablet (5 mg total) by mouth daily as needed for [facility] and ED  Current Outpatient Medications (Respiratory):    fluticasone (FLONASE) 50 MCG/ACT nasal spray, Place 1 spray into both nostrils daily.  Current Outpatient Medications (Analgesics):    acetaminophen (TYLENOL) 500 MG tablet, Take 1,000 mg by mouth every 6 (six) hours as needed for moderate pain or headache.   allopurinol (ZYLOPRIM) 300 MG tablet, Take 1 tablet (300 mg total) by mouth daily. (Patient not taking: Reported on 01/08/2023)   aspirin EC 81 MG tablet, Take 1 tablet (81 mg total) by mouth daily. Swallow whole.   oxyCODONE-acetaminophen (PERCOCET) 5-325 MG tablet, Take 1 tablet by mouth every 4 (four) hours as needed for severe pain.  Current Outpatient Medications (Hematological):    Cyanocobalamin (VITAMIN B-12) 1000 MCG/15ML LIQD, Take 15 mLs by mouth daily.   ticagrelor (BRILINTA) 90 MG TABS tablet, Take 1 tablet (90 mg total) by mouth 2 (two) times daily.  Current Outpatient Medications (Other):    ALPRAZolam (XANAX) 0.25 MG tablet, Take 1 tablet (0.25 mg total) by mouth 2 (two) times daily as needed for anxiety   anastrozole (ARIMIDEX) 1 MG tablet, Take 0.5 tablets (0.5 mg total) by mouth once a week.   anastrozole (ARIMIDEX) 1 MG tablet, Take 1/2 tablet (0.5 mg total) by mouth once a week.   anastrozole (ARIMIDEX) 1 MG tablet, Take 0.5 tablets (0.5 mg total) by mouth 2 (two) times a week.   buPROPion (WELLBUTRIN XL) 150 MG 24 hr tablet, Take 1 tablet (150 mg total) by mouth every morning. (Patient not taking: Reported on 01/08/2023)   Cholecalciferol (VITAMIN D) 125 MCG (5000 UT) CAPS, Take 5,000 Units by mouth daily.   DHEA 25 MG tablet, Take 25 mg by mouth daily.   esomeprazole (NEXIUM) 40 MG capsule, Take 1 capsule (40 mg total) by mouth 2 (two) times daily.   mesalamine (LIALDA) 1.2 g EC tablet, Take 1.2 g by mouth daily with breakfast.  Take 1.2g daily, May take an additional 3 tablets as needed (total of 4 Tablets daily) (Patient not taking: Reported on 01/08/2023)   mesalamine (LIALDA) 1.2 g EC tablet, Take 4 tablets (4.8 g total) by mouth once daily.   methocarbamol (ROBAXIN) 500 MG tablet, Take 500 mg by mouth daily as needed for muscle spasms.   methocarbamol (ROBAXIN) 500 MG tablet, Take 1 tablet (500 mg total) by mouth every 6 (six) hours as needed for muscle spasms.   NEEDLE, DISP, 18 G 18G X 1" MISC, Use as directed   ondansetron (ZOFRAN) 4 MG tablet, Take 1 tablet (4 mg total) by mouth every 8 (eight) hours as needed for nausea or vomiting. (Patient not taking: Reported on 01/08/2023)   ondansetron (ZOFRAN) 4 MG tablet, Take 1 tablet (4 mg total) by mouth every 8 (eight) hours as needed for refractory vomiting or nausea. (Patient not taking: Reported on 01/08/2023)   SYRINGE-NEEDLE, DISP, 3 ML (B-D 3CC LUER-LOK SYR 25GX1") 25G X 1" 3 ML MISC,  Use as directed   Vilazodone HCl (VIIBRYD) 40 MG TABS, Take 1 tablet (40 mg total) by mouth daily with food *For reference purposes while preparing patient instructions.   Delete this med list prior to printing instructions for patient.* Hold Jardiance 24 hours prior to procedure.   On the morning of your procedure, take your Aspirin 81 mg and Brilinta/Ticagrelor and any morning medicines NOT listed above.  You may use sips of water.  5. Plan to go home the same day, you will only stay overnight if medically necessary. 6. Bring a current list of your medications and current insurance cards. 7. You MUST have a responsible person to drive you home. 8. Someone MUST be with you the first 24 hours after you arrive home or your discharge will be delayed. 9. Please wear clothes that are easy to get on and off and wear slip-on shoes.  Thank you for allowing [place] to care for you!   -- [place] [facility]

## 2023-01-08 NOTE — Progress Notes (Signed)
Renee Rival!

## 2023-01-08 NOTE — Progress Notes (Signed)
in size.  Right Coronary Artery Vessel is large. There is moderate diffuse disease throughout the vessel. Prox RCA lesion is 90% stenosed. The lesion is  eccentric. Prox RCA to Mid RCA lesion is 80% stenosed. The lesion was previously treated using a stent (unknown type) over 2 years ago. Previously placed stent displays restenosis. Mid RCA to Dist RCA lesion is 100% stenosed with 100% stenosed side branch in Acute Mrg. Dist RCA lesion is 90% stenosed.  Acute Marginal Branch Vessel is small in size.  Right Ventricular Branch Vessel is small in size.  Right Posterior Descending Artery Vessel is small in size. There is moderate disease in the vessel.  Right Posterior Atrioventricular Artery There is mild disease in the vessel.  Second Right Posterolateral Branch Vessel is small in size.  Saphenous Graft To 1st RPL SVG graft was visualized by angiography and is large. The graft exhibits mild . The flow is not reversed. There is no competitive flow  Saphenous Graft To 1st Diag SVG graft was visualized by angiography and is normal in caliber. Origin to Prox Graft lesion is 100% stenosed. The lesion is chronically occluded.  Saphenous Graft To 2nd Mrg SVG graft was visualized by angiography and is large. The graft exhibits no disease.  LIMA LIMA Graft To Dist LAD LIMA graft was visualized by angiography and is normal in caliber. The graft exhibits no disease.  Intervention  No interventions have been documented.   STRESS TESTS  MYOCARDIAL PERFUSION IMAGING 07/02/2020  Narrative  Nuclear stress EF: 40%.  The left ventricular ejection fraction is moderately decreased (30-44%).  Horizontal ST segment depression ST segment depression was noted during stress in the V4, V5 and V6 leads.  T wave inversion was noted during stress.  Defect 1: There is a medium defect of moderate severity present in the apical anterior and apex location.  Findings consistent with prior myocardial infarction with peri-infarct ischemia.  This is an intermediate risk study.  There is an area at the anterior and apical portions with diminished  perfusion at rest that appears to worsen slightly with stress. This is consistent with infarct with small amount of peri-infarct ischemia. There is global hypokinesis with septal hypokinesis consistent with prior sternotomy. Intermediate risk study.   ECHOCARDIOGRAM  ECHOCARDIOGRAM COMPLETE 12/01/2021  Narrative ECHOCARDIOGRAM REPORT    Patient Name:   Daniel Buck Date of Exam: 12/01/2021 Medical Rec #:  [id number]     Height:       71.0 in Accession #:    [id number]    Weight:       211.4 lb Date of Birth:  May 03, 1965     BSA:          2.159 m Patient Age:    57 years      BP:           116/75 mmHg Patient Gender: M             HR:           66 bpm. Exam Location:  [address]  Procedure: 2D Echo, Cardiac Doppler, Color Doppler and 3D Echo  Indications:    Congestive Heart Failure-Acute Systolic I50.21  History:        Patient has prior history of Echocardiogram examinations, most recent 09/19/2021. Prior CABG; Risk Factors:Hypertension and Dyslipidemia.  Sonographer:    Thurman Coyer RDCS Referring Phys: Thomasene Ripple CLEAVER  IMPRESSIONS   1. Left ventricular ejection fraction, by estimation, is 45 to 50%. Left  in size.  Right Coronary Artery Vessel is large. There is moderate diffuse disease throughout the vessel. Prox RCA lesion is 90% stenosed. The lesion is  eccentric. Prox RCA to Mid RCA lesion is 80% stenosed. The lesion was previously treated using a stent (unknown type) over 2 years ago. Previously placed stent displays restenosis. Mid RCA to Dist RCA lesion is 100% stenosed with 100% stenosed side branch in Acute Mrg. Dist RCA lesion is 90% stenosed.  Acute Marginal Branch Vessel is small in size.  Right Ventricular Branch Vessel is small in size.  Right Posterior Descending Artery Vessel is small in size. There is moderate disease in the vessel.  Right Posterior Atrioventricular Artery There is mild disease in the vessel.  Second Right Posterolateral Branch Vessel is small in size.  Saphenous Graft To 1st RPL SVG graft was visualized by angiography and is large. The graft exhibits mild . The flow is not reversed. There is no competitive flow  Saphenous Graft To 1st Diag SVG graft was visualized by angiography and is normal in caliber. Origin to Prox Graft lesion is 100% stenosed. The lesion is chronically occluded.  Saphenous Graft To 2nd Mrg SVG graft was visualized by angiography and is large. The graft exhibits no disease.  LIMA LIMA Graft To Dist LAD LIMA graft was visualized by angiography and is normal in caliber. The graft exhibits no disease.  Intervention  No interventions have been documented.   STRESS TESTS  MYOCARDIAL PERFUSION IMAGING 07/02/2020  Narrative  Nuclear stress EF: 40%.  The left ventricular ejection fraction is moderately decreased (30-44%).  Horizontal ST segment depression ST segment depression was noted during stress in the V4, V5 and V6 leads.  T wave inversion was noted during stress.  Defect 1: There is a medium defect of moderate severity present in the apical anterior and apex location.  Findings consistent with prior myocardial infarction with peri-infarct ischemia.  This is an intermediate risk study.  There is an area at the anterior and apical portions with diminished  perfusion at rest that appears to worsen slightly with stress. This is consistent with infarct with small amount of peri-infarct ischemia. There is global hypokinesis with septal hypokinesis consistent with prior sternotomy. Intermediate risk study.   ECHOCARDIOGRAM  ECHOCARDIOGRAM COMPLETE 12/01/2021  Narrative ECHOCARDIOGRAM REPORT    Patient Name:   Daniel Buck Date of Exam: 12/01/2021 Medical Rec #:  [id number]     Height:       71.0 in Accession #:    [id number]    Weight:       211.4 lb Date of Birth:  May 03, 1965     BSA:          2.159 m Patient Age:    57 years      BP:           116/75 mmHg Patient Gender: M             HR:           66 bpm. Exam Location:  [address]  Procedure: 2D Echo, Cardiac Doppler, Color Doppler and 3D Echo  Indications:    Congestive Heart Failure-Acute Systolic I50.21  History:        Patient has prior history of Echocardiogram examinations, most recent 09/19/2021. Prior CABG; Risk Factors:Hypertension and Dyslipidemia.  Sonographer:    Thurman Coyer RDCS Referring Phys: Thomasene Ripple CLEAVER  IMPRESSIONS   1. Left ventricular ejection fraction, by estimation, is 45 to 50%. Left  in size.  Right Coronary Artery Vessel is large. There is moderate diffuse disease throughout the vessel. Prox RCA lesion is 90% stenosed. The lesion is  eccentric. Prox RCA to Mid RCA lesion is 80% stenosed. The lesion was previously treated using a stent (unknown type) over 2 years ago. Previously placed stent displays restenosis. Mid RCA to Dist RCA lesion is 100% stenosed with 100% stenosed side branch in Acute Mrg. Dist RCA lesion is 90% stenosed.  Acute Marginal Branch Vessel is small in size.  Right Ventricular Branch Vessel is small in size.  Right Posterior Descending Artery Vessel is small in size. There is moderate disease in the vessel.  Right Posterior Atrioventricular Artery There is mild disease in the vessel.  Second Right Posterolateral Branch Vessel is small in size.  Saphenous Graft To 1st RPL SVG graft was visualized by angiography and is large. The graft exhibits mild . The flow is not reversed. There is no competitive flow  Saphenous Graft To 1st Diag SVG graft was visualized by angiography and is normal in caliber. Origin to Prox Graft lesion is 100% stenosed. The lesion is chronically occluded.  Saphenous Graft To 2nd Mrg SVG graft was visualized by angiography and is large. The graft exhibits no disease.  LIMA LIMA Graft To Dist LAD LIMA graft was visualized by angiography and is normal in caliber. The graft exhibits no disease.  Intervention  No interventions have been documented.   STRESS TESTS  MYOCARDIAL PERFUSION IMAGING 07/02/2020  Narrative  Nuclear stress EF: 40%.  The left ventricular ejection fraction is moderately decreased (30-44%).  Horizontal ST segment depression ST segment depression was noted during stress in the V4, V5 and V6 leads.  T wave inversion was noted during stress.  Defect 1: There is a medium defect of moderate severity present in the apical anterior and apex location.  Findings consistent with prior myocardial infarction with peri-infarct ischemia.  This is an intermediate risk study.  There is an area at the anterior and apical portions with diminished  perfusion at rest that appears to worsen slightly with stress. This is consistent with infarct with small amount of peri-infarct ischemia. There is global hypokinesis with septal hypokinesis consistent with prior sternotomy. Intermediate risk study.   ECHOCARDIOGRAM  ECHOCARDIOGRAM COMPLETE 12/01/2021  Narrative ECHOCARDIOGRAM REPORT    Patient Name:   Daniel Buck Date of Exam: 12/01/2021 Medical Rec #:  [id number]     Height:       71.0 in Accession #:    [id number]    Weight:       211.4 lb Date of Birth:  May 03, 1965     BSA:          2.159 m Patient Age:    57 years      BP:           116/75 mmHg Patient Gender: M             HR:           66 bpm. Exam Location:  [address]  Procedure: 2D Echo, Cardiac Doppler, Color Doppler and 3D Echo  Indications:    Congestive Heart Failure-Acute Systolic I50.21  History:        Patient has prior history of Echocardiogram examinations, most recent 09/19/2021. Prior CABG; Risk Factors:Hypertension and Dyslipidemia.  Sonographer:    Thurman Coyer RDCS Referring Phys: Thomasene Ripple CLEAVER  IMPRESSIONS   1. Left ventricular ejection fraction, by estimation, is 45 to 50%. Left  ar LV vol d, MOD    126.0 ml                   ejection A2C:                                        fraction LV vol d, MOD    92.6 ml                    by 3D A4C:                                        volume is LV vol s, MOD    68.1 ml                    45 %. A2C: LV vol s, MOD    54.2 ml A4C:                           3D Volume EF: LV SV MOD A2C:   57.9 ml       3D EF:        45 % LV SV MOD A4C:   92.6 ml       LV EDV:       148 ml LV SV MOD BP:    46.4 ml       LV ESV:       82 ml LV SV:        66 ml  RIGHT VENTRICLE RV Basal diam:  3.00 cm RV Mid diam:    2.60 cm RV S prime:     7.18 cm/s TAPSE (M-mode): 1.6 cm  LEFT ATRIUM             Index        RIGHT ATRIUM           Index LA diam:        3.90 cm 1.81 cm/m   RA Area:     17.60 cm LA Vol (A2C):   64.8 ml 30.02 ml/m  RA Volume:   42.40 ml  19.64 ml/m LA Vol (A4C):   20.6 ml 9.54 ml/m LA Biplane Vol: 37.5 ml 17.37 ml/m AORTIC VALVE LVOT Vmax:   103.00 cm/s LVOT Vmean:  67.100 cm/s LVOT VTI:    0.202 m  AORTA Ao Root diam: 3.60 cm Ao Asc diam:  3.90 cm  MITRAL VALVE MV Area (PHT): 3.99 cm    SHUNTS MV Decel Time: 190 msec    Systemic VTI:  0.20 m MV E velocity: 76.00 cm/s  Systemic Diam: 2.40 cm MV A velocity: 69.20 cm/s MV E/A ratio:  1.10  Mihai Croitoru MD Electronically signed by Thurmon Fair MD Signature Date/Time: 12/01/2021/12:21:22  PM    Final   TEE  ECHO INTRAOPERATIVE TEE 09/04/2019  Narrative *INTRAOPERATIVE TRANSESOPHAGEAL REPORT *    Patient Name:   JEREMIYAH CULLENS Date of Exam: 09/04/2019 Medical Rec #:  [id number]     Height:       71.0 in Accession #:    [id number]    Weight:       200.0 lb Date of Birth:  1965-05-27     BSA:  in size.  Right Coronary Artery Vessel is large. There is moderate diffuse disease throughout the vessel. Prox RCA lesion is 90% stenosed. The lesion is  eccentric. Prox RCA to Mid RCA lesion is 80% stenosed. The lesion was previously treated using a stent (unknown type) over 2 years ago. Previously placed stent displays restenosis. Mid RCA to Dist RCA lesion is 100% stenosed with 100% stenosed side branch in Acute Mrg. Dist RCA lesion is 90% stenosed.  Acute Marginal Branch Vessel is small in size.  Right Ventricular Branch Vessel is small in size.  Right Posterior Descending Artery Vessel is small in size. There is moderate disease in the vessel.  Right Posterior Atrioventricular Artery There is mild disease in the vessel.  Second Right Posterolateral Branch Vessel is small in size.  Saphenous Graft To 1st RPL SVG graft was visualized by angiography and is large. The graft exhibits mild . The flow is not reversed. There is no competitive flow  Saphenous Graft To 1st Diag SVG graft was visualized by angiography and is normal in caliber. Origin to Prox Graft lesion is 100% stenosed. The lesion is chronically occluded.  Saphenous Graft To 2nd Mrg SVG graft was visualized by angiography and is large. The graft exhibits no disease.  LIMA LIMA Graft To Dist LAD LIMA graft was visualized by angiography and is normal in caliber. The graft exhibits no disease.  Intervention  No interventions have been documented.   STRESS TESTS  MYOCARDIAL PERFUSION IMAGING 07/02/2020  Narrative  Nuclear stress EF: 40%.  The left ventricular ejection fraction is moderately decreased (30-44%).  Horizontal ST segment depression ST segment depression was noted during stress in the V4, V5 and V6 leads.  T wave inversion was noted during stress.  Defect 1: There is a medium defect of moderate severity present in the apical anterior and apex location.  Findings consistent with prior myocardial infarction with peri-infarct ischemia.  This is an intermediate risk study.  There is an area at the anterior and apical portions with diminished  perfusion at rest that appears to worsen slightly with stress. This is consistent with infarct with small amount of peri-infarct ischemia. There is global hypokinesis with septal hypokinesis consistent with prior sternotomy. Intermediate risk study.   ECHOCARDIOGRAM  ECHOCARDIOGRAM COMPLETE 12/01/2021  Narrative ECHOCARDIOGRAM REPORT    Patient Name:   Daniel Buck Date of Exam: 12/01/2021 Medical Rec #:  [id number]     Height:       71.0 in Accession #:    [id number]    Weight:       211.4 lb Date of Birth:  May 03, 1965     BSA:          2.159 m Patient Age:    57 years      BP:           116/75 mmHg Patient Gender: M             HR:           66 bpm. Exam Location:  [address]  Procedure: 2D Echo, Cardiac Doppler, Color Doppler and 3D Echo  Indications:    Congestive Heart Failure-Acute Systolic I50.21  History:        Patient has prior history of Echocardiogram examinations, most recent 09/19/2021. Prior CABG; Risk Factors:Hypertension and Dyslipidemia.  Sonographer:    Thurman Coyer RDCS Referring Phys: Thomasene Ripple CLEAVER  IMPRESSIONS   1. Left ventricular ejection fraction, by estimation, is 45 to 50%. Left  in size.  Right Coronary Artery Vessel is large. There is moderate diffuse disease throughout the vessel. Prox RCA lesion is 90% stenosed. The lesion is  eccentric. Prox RCA to Mid RCA lesion is 80% stenosed. The lesion was previously treated using a stent (unknown type) over 2 years ago. Previously placed stent displays restenosis. Mid RCA to Dist RCA lesion is 100% stenosed with 100% stenosed side branch in Acute Mrg. Dist RCA lesion is 90% stenosed.  Acute Marginal Branch Vessel is small in size.  Right Ventricular Branch Vessel is small in size.  Right Posterior Descending Artery Vessel is small in size. There is moderate disease in the vessel.  Right Posterior Atrioventricular Artery There is mild disease in the vessel.  Second Right Posterolateral Branch Vessel is small in size.  Saphenous Graft To 1st RPL SVG graft was visualized by angiography and is large. The graft exhibits mild . The flow is not reversed. There is no competitive flow  Saphenous Graft To 1st Diag SVG graft was visualized by angiography and is normal in caliber. Origin to Prox Graft lesion is 100% stenosed. The lesion is chronically occluded.  Saphenous Graft To 2nd Mrg SVG graft was visualized by angiography and is large. The graft exhibits no disease.  LIMA LIMA Graft To Dist LAD LIMA graft was visualized by angiography and is normal in caliber. The graft exhibits no disease.  Intervention  No interventions have been documented.   STRESS TESTS  MYOCARDIAL PERFUSION IMAGING 07/02/2020  Narrative  Nuclear stress EF: 40%.  The left ventricular ejection fraction is moderately decreased (30-44%).  Horizontal ST segment depression ST segment depression was noted during stress in the V4, V5 and V6 leads.  T wave inversion was noted during stress.  Defect 1: There is a medium defect of moderate severity present in the apical anterior and apex location.  Findings consistent with prior myocardial infarction with peri-infarct ischemia.  This is an intermediate risk study.  There is an area at the anterior and apical portions with diminished  perfusion at rest that appears to worsen slightly with stress. This is consistent with infarct with small amount of peri-infarct ischemia. There is global hypokinesis with septal hypokinesis consistent with prior sternotomy. Intermediate risk study.   ECHOCARDIOGRAM  ECHOCARDIOGRAM COMPLETE 12/01/2021  Narrative ECHOCARDIOGRAM REPORT    Patient Name:   Daniel Buck Date of Exam: 12/01/2021 Medical Rec #:  [id number]     Height:       71.0 in Accession #:    [id number]    Weight:       211.4 lb Date of Birth:  May 03, 1965     BSA:          2.159 m Patient Age:    57 years      BP:           116/75 mmHg Patient Gender: M             HR:           66 bpm. Exam Location:  [address]  Procedure: 2D Echo, Cardiac Doppler, Color Doppler and 3D Echo  Indications:    Congestive Heart Failure-Acute Systolic I50.21  History:        Patient has prior history of Echocardiogram examinations, most recent 09/19/2021. Prior CABG; Risk Factors:Hypertension and Dyslipidemia.  Sonographer:    Thurman Coyer RDCS Referring Phys: Thomasene Ripple CLEAVER  IMPRESSIONS   1. Left ventricular ejection fraction, by estimation, is 45 to 50%. Left  in size.  Right Coronary Artery Vessel is large. There is moderate diffuse disease throughout the vessel. Prox RCA lesion is 90% stenosed. The lesion is  eccentric. Prox RCA to Mid RCA lesion is 80% stenosed. The lesion was previously treated using a stent (unknown type) over 2 years ago. Previously placed stent displays restenosis. Mid RCA to Dist RCA lesion is 100% stenosed with 100% stenosed side branch in Acute Mrg. Dist RCA lesion is 90% stenosed.  Acute Marginal Branch Vessel is small in size.  Right Ventricular Branch Vessel is small in size.  Right Posterior Descending Artery Vessel is small in size. There is moderate disease in the vessel.  Right Posterior Atrioventricular Artery There is mild disease in the vessel.  Second Right Posterolateral Branch Vessel is small in size.  Saphenous Graft To 1st RPL SVG graft was visualized by angiography and is large. The graft exhibits mild . The flow is not reversed. There is no competitive flow  Saphenous Graft To 1st Diag SVG graft was visualized by angiography and is normal in caliber. Origin to Prox Graft lesion is 100% stenosed. The lesion is chronically occluded.  Saphenous Graft To 2nd Mrg SVG graft was visualized by angiography and is large. The graft exhibits no disease.  LIMA LIMA Graft To Dist LAD LIMA graft was visualized by angiography and is normal in caliber. The graft exhibits no disease.  Intervention  No interventions have been documented.   STRESS TESTS  MYOCARDIAL PERFUSION IMAGING 07/02/2020  Narrative  Nuclear stress EF: 40%.  The left ventricular ejection fraction is moderately decreased (30-44%).  Horizontal ST segment depression ST segment depression was noted during stress in the V4, V5 and V6 leads.  T wave inversion was noted during stress.  Defect 1: There is a medium defect of moderate severity present in the apical anterior and apex location.  Findings consistent with prior myocardial infarction with peri-infarct ischemia.  This is an intermediate risk study.  There is an area at the anterior and apical portions with diminished  perfusion at rest that appears to worsen slightly with stress. This is consistent with infarct with small amount of peri-infarct ischemia. There is global hypokinesis with septal hypokinesis consistent with prior sternotomy. Intermediate risk study.   ECHOCARDIOGRAM  ECHOCARDIOGRAM COMPLETE 12/01/2021  Narrative ECHOCARDIOGRAM REPORT    Patient Name:   Daniel Buck Date of Exam: 12/01/2021 Medical Rec #:  [id number]     Height:       71.0 in Accession #:    [id number]    Weight:       211.4 lb Date of Birth:  May 03, 1965     BSA:          2.159 m Patient Age:    57 years      BP:           116/75 mmHg Patient Gender: M             HR:           66 bpm. Exam Location:  [address]  Procedure: 2D Echo, Cardiac Doppler, Color Doppler and 3D Echo  Indications:    Congestive Heart Failure-Acute Systolic I50.21  History:        Patient has prior history of Echocardiogram examinations, most recent 09/19/2021. Prior CABG; Risk Factors:Hypertension and Dyslipidemia.  Sonographer:    Thurman Coyer RDCS Referring Phys: Thomasene Ripple CLEAVER  IMPRESSIONS   1. Left ventricular ejection fraction, by estimation, is 45 to 50%. Left  in size.  Right Coronary Artery Vessel is large. There is moderate diffuse disease throughout the vessel. Prox RCA lesion is 90% stenosed. The lesion is  eccentric. Prox RCA to Mid RCA lesion is 80% stenosed. The lesion was previously treated using a stent (unknown type) over 2 years ago. Previously placed stent displays restenosis. Mid RCA to Dist RCA lesion is 100% stenosed with 100% stenosed side branch in Acute Mrg. Dist RCA lesion is 90% stenosed.  Acute Marginal Branch Vessel is small in size.  Right Ventricular Branch Vessel is small in size.  Right Posterior Descending Artery Vessel is small in size. There is moderate disease in the vessel.  Right Posterior Atrioventricular Artery There is mild disease in the vessel.  Second Right Posterolateral Branch Vessel is small in size.  Saphenous Graft To 1st RPL SVG graft was visualized by angiography and is large. The graft exhibits mild . The flow is not reversed. There is no competitive flow  Saphenous Graft To 1st Diag SVG graft was visualized by angiography and is normal in caliber. Origin to Prox Graft lesion is 100% stenosed. The lesion is chronically occluded.  Saphenous Graft To 2nd Mrg SVG graft was visualized by angiography and is large. The graft exhibits no disease.  LIMA LIMA Graft To Dist LAD LIMA graft was visualized by angiography and is normal in caliber. The graft exhibits no disease.  Intervention  No interventions have been documented.   STRESS TESTS  MYOCARDIAL PERFUSION IMAGING 07/02/2020  Narrative  Nuclear stress EF: 40%.  The left ventricular ejection fraction is moderately decreased (30-44%).  Horizontal ST segment depression ST segment depression was noted during stress in the V4, V5 and V6 leads.  T wave inversion was noted during stress.  Defect 1: There is a medium defect of moderate severity present in the apical anterior and apex location.  Findings consistent with prior myocardial infarction with peri-infarct ischemia.  This is an intermediate risk study.  There is an area at the anterior and apical portions with diminished  perfusion at rest that appears to worsen slightly with stress. This is consistent with infarct with small amount of peri-infarct ischemia. There is global hypokinesis with septal hypokinesis consistent with prior sternotomy. Intermediate risk study.   ECHOCARDIOGRAM  ECHOCARDIOGRAM COMPLETE 12/01/2021  Narrative ECHOCARDIOGRAM REPORT    Patient Name:   Daniel Buck Date of Exam: 12/01/2021 Medical Rec #:  [id number]     Height:       71.0 in Accession #:    [id number]    Weight:       211.4 lb Date of Birth:  May 03, 1965     BSA:          2.159 m Patient Age:    57 years      BP:           116/75 mmHg Patient Gender: M             HR:           66 bpm. Exam Location:  [address]  Procedure: 2D Echo, Cardiac Doppler, Color Doppler and 3D Echo  Indications:    Congestive Heart Failure-Acute Systolic I50.21  History:        Patient has prior history of Echocardiogram examinations, most recent 09/19/2021. Prior CABG; Risk Factors:Hypertension and Dyslipidemia.  Sonographer:    Thurman Coyer RDCS Referring Phys: Thomasene Ripple CLEAVER  IMPRESSIONS   1. Left ventricular ejection fraction, by estimation, is 45 to 50%. Left  in size.  Right Coronary Artery Vessel is large. There is moderate diffuse disease throughout the vessel. Prox RCA lesion is 90% stenosed. The lesion is  eccentric. Prox RCA to Mid RCA lesion is 80% stenosed. The lesion was previously treated using a stent (unknown type) over 2 years ago. Previously placed stent displays restenosis. Mid RCA to Dist RCA lesion is 100% stenosed with 100% stenosed side branch in Acute Mrg. Dist RCA lesion is 90% stenosed.  Acute Marginal Branch Vessel is small in size.  Right Ventricular Branch Vessel is small in size.  Right Posterior Descending Artery Vessel is small in size. There is moderate disease in the vessel.  Right Posterior Atrioventricular Artery There is mild disease in the vessel.  Second Right Posterolateral Branch Vessel is small in size.  Saphenous Graft To 1st RPL SVG graft was visualized by angiography and is large. The graft exhibits mild . The flow is not reversed. There is no competitive flow  Saphenous Graft To 1st Diag SVG graft was visualized by angiography and is normal in caliber. Origin to Prox Graft lesion is 100% stenosed. The lesion is chronically occluded.  Saphenous Graft To 2nd Mrg SVG graft was visualized by angiography and is large. The graft exhibits no disease.  LIMA LIMA Graft To Dist LAD LIMA graft was visualized by angiography and is normal in caliber. The graft exhibits no disease.  Intervention  No interventions have been documented.   STRESS TESTS  MYOCARDIAL PERFUSION IMAGING 07/02/2020  Narrative  Nuclear stress EF: 40%.  The left ventricular ejection fraction is moderately decreased (30-44%).  Horizontal ST segment depression ST segment depression was noted during stress in the V4, V5 and V6 leads.  T wave inversion was noted during stress.  Defect 1: There is a medium defect of moderate severity present in the apical anterior and apex location.  Findings consistent with prior myocardial infarction with peri-infarct ischemia.  This is an intermediate risk study.  There is an area at the anterior and apical portions with diminished  perfusion at rest that appears to worsen slightly with stress. This is consistent with infarct with small amount of peri-infarct ischemia. There is global hypokinesis with septal hypokinesis consistent with prior sternotomy. Intermediate risk study.   ECHOCARDIOGRAM  ECHOCARDIOGRAM COMPLETE 12/01/2021  Narrative ECHOCARDIOGRAM REPORT    Patient Name:   Daniel Buck Date of Exam: 12/01/2021 Medical Rec #:  [id number]     Height:       71.0 in Accession #:    [id number]    Weight:       211.4 lb Date of Birth:  May 03, 1965     BSA:          2.159 m Patient Age:    57 years      BP:           116/75 mmHg Patient Gender: M             HR:           66 bpm. Exam Location:  [address]  Procedure: 2D Echo, Cardiac Doppler, Color Doppler and 3D Echo  Indications:    Congestive Heart Failure-Acute Systolic I50.21  History:        Patient has prior history of Echocardiogram examinations, most recent 09/19/2021. Prior CABG; Risk Factors:Hypertension and Dyslipidemia.  Sonographer:    Thurman Coyer RDCS Referring Phys: Thomasene Ripple CLEAVER  IMPRESSIONS   1. Left ventricular ejection fraction, by estimation, is 45 to 50%. Left  in size.  Right Coronary Artery Vessel is large. There is moderate diffuse disease throughout the vessel. Prox RCA lesion is 90% stenosed. The lesion is  eccentric. Prox RCA to Mid RCA lesion is 80% stenosed. The lesion was previously treated using a stent (unknown type) over 2 years ago. Previously placed stent displays restenosis. Mid RCA to Dist RCA lesion is 100% stenosed with 100% stenosed side branch in Acute Mrg. Dist RCA lesion is 90% stenosed.  Acute Marginal Branch Vessel is small in size.  Right Ventricular Branch Vessel is small in size.  Right Posterior Descending Artery Vessel is small in size. There is moderate disease in the vessel.  Right Posterior Atrioventricular Artery There is mild disease in the vessel.  Second Right Posterolateral Branch Vessel is small in size.  Saphenous Graft To 1st RPL SVG graft was visualized by angiography and is large. The graft exhibits mild . The flow is not reversed. There is no competitive flow  Saphenous Graft To 1st Diag SVG graft was visualized by angiography and is normal in caliber. Origin to Prox Graft lesion is 100% stenosed. The lesion is chronically occluded.  Saphenous Graft To 2nd Mrg SVG graft was visualized by angiography and is large. The graft exhibits no disease.  LIMA LIMA Graft To Dist LAD LIMA graft was visualized by angiography and is normal in caliber. The graft exhibits no disease.  Intervention  No interventions have been documented.   STRESS TESTS  MYOCARDIAL PERFUSION IMAGING 07/02/2020  Narrative  Nuclear stress EF: 40%.  The left ventricular ejection fraction is moderately decreased (30-44%).  Horizontal ST segment depression ST segment depression was noted during stress in the V4, V5 and V6 leads.  T wave inversion was noted during stress.  Defect 1: There is a medium defect of moderate severity present in the apical anterior and apex location.  Findings consistent with prior myocardial infarction with peri-infarct ischemia.  This is an intermediate risk study.  There is an area at the anterior and apical portions with diminished  perfusion at rest that appears to worsen slightly with stress. This is consistent with infarct with small amount of peri-infarct ischemia. There is global hypokinesis with septal hypokinesis consistent with prior sternotomy. Intermediate risk study.   ECHOCARDIOGRAM  ECHOCARDIOGRAM COMPLETE 12/01/2021  Narrative ECHOCARDIOGRAM REPORT    Patient Name:   Daniel Buck Date of Exam: 12/01/2021 Medical Rec #:  [id number]     Height:       71.0 in Accession #:    [id number]    Weight:       211.4 lb Date of Birth:  May 03, 1965     BSA:          2.159 m Patient Age:    57 years      BP:           116/75 mmHg Patient Gender: M             HR:           66 bpm. Exam Location:  [address]  Procedure: 2D Echo, Cardiac Doppler, Color Doppler and 3D Echo  Indications:    Congestive Heart Failure-Acute Systolic I50.21  History:        Patient has prior history of Echocardiogram examinations, most recent 09/19/2021. Prior CABG; Risk Factors:Hypertension and Dyslipidemia.  Sonographer:    Thurman Coyer RDCS Referring Phys: Thomasene Ripple CLEAVER  IMPRESSIONS   1. Left ventricular ejection fraction, by estimation, is 45 to 50%. Left  in size.  Right Coronary Artery Vessel is large. There is moderate diffuse disease throughout the vessel. Prox RCA lesion is 90% stenosed. The lesion is  eccentric. Prox RCA to Mid RCA lesion is 80% stenosed. The lesion was previously treated using a stent (unknown type) over 2 years ago. Previously placed stent displays restenosis. Mid RCA to Dist RCA lesion is 100% stenosed with 100% stenosed side branch in Acute Mrg. Dist RCA lesion is 90% stenosed.  Acute Marginal Branch Vessel is small in size.  Right Ventricular Branch Vessel is small in size.  Right Posterior Descending Artery Vessel is small in size. There is moderate disease in the vessel.  Right Posterior Atrioventricular Artery There is mild disease in the vessel.  Second Right Posterolateral Branch Vessel is small in size.  Saphenous Graft To 1st RPL SVG graft was visualized by angiography and is large. The graft exhibits mild . The flow is not reversed. There is no competitive flow  Saphenous Graft To 1st Diag SVG graft was visualized by angiography and is normal in caliber. Origin to Prox Graft lesion is 100% stenosed. The lesion is chronically occluded.  Saphenous Graft To 2nd Mrg SVG graft was visualized by angiography and is large. The graft exhibits no disease.  LIMA LIMA Graft To Dist LAD LIMA graft was visualized by angiography and is normal in caliber. The graft exhibits no disease.  Intervention  No interventions have been documented.   STRESS TESTS  MYOCARDIAL PERFUSION IMAGING 07/02/2020  Narrative  Nuclear stress EF: 40%.  The left ventricular ejection fraction is moderately decreased (30-44%).  Horizontal ST segment depression ST segment depression was noted during stress in the V4, V5 and V6 leads.  T wave inversion was noted during stress.  Defect 1: There is a medium defect of moderate severity present in the apical anterior and apex location.  Findings consistent with prior myocardial infarction with peri-infarct ischemia.  This is an intermediate risk study.  There is an area at the anterior and apical portions with diminished  perfusion at rest that appears to worsen slightly with stress. This is consistent with infarct with small amount of peri-infarct ischemia. There is global hypokinesis with septal hypokinesis consistent with prior sternotomy. Intermediate risk study.   ECHOCARDIOGRAM  ECHOCARDIOGRAM COMPLETE 12/01/2021  Narrative ECHOCARDIOGRAM REPORT    Patient Name:   Daniel Buck Date of Exam: 12/01/2021 Medical Rec #:  [id number]     Height:       71.0 in Accession #:    [id number]    Weight:       211.4 lb Date of Birth:  May 03, 1965     BSA:          2.159 m Patient Age:    57 years      BP:           116/75 mmHg Patient Gender: M             HR:           66 bpm. Exam Location:  [address]  Procedure: 2D Echo, Cardiac Doppler, Color Doppler and 3D Echo  Indications:    Congestive Heart Failure-Acute Systolic I50.21  History:        Patient has prior history of Echocardiogram examinations, most recent 09/19/2021. Prior CABG; Risk Factors:Hypertension and Dyslipidemia.  Sonographer:    Thurman Coyer RDCS Referring Phys: Thomasene Ripple CLEAVER  IMPRESSIONS   1. Left ventricular ejection fraction, by estimation, is 45 to 50%. Left  in size.  Right Coronary Artery Vessel is large. There is moderate diffuse disease throughout the vessel. Prox RCA lesion is 90% stenosed. The lesion is  eccentric. Prox RCA to Mid RCA lesion is 80% stenosed. The lesion was previously treated using a stent (unknown type) over 2 years ago. Previously placed stent displays restenosis. Mid RCA to Dist RCA lesion is 100% stenosed with 100% stenosed side branch in Acute Mrg. Dist RCA lesion is 90% stenosed.  Acute Marginal Branch Vessel is small in size.  Right Ventricular Branch Vessel is small in size.  Right Posterior Descending Artery Vessel is small in size. There is moderate disease in the vessel.  Right Posterior Atrioventricular Artery There is mild disease in the vessel.  Second Right Posterolateral Branch Vessel is small in size.  Saphenous Graft To 1st RPL SVG graft was visualized by angiography and is large. The graft exhibits mild . The flow is not reversed. There is no competitive flow  Saphenous Graft To 1st Diag SVG graft was visualized by angiography and is normal in caliber. Origin to Prox Graft lesion is 100% stenosed. The lesion is chronically occluded.  Saphenous Graft To 2nd Mrg SVG graft was visualized by angiography and is large. The graft exhibits no disease.  LIMA LIMA Graft To Dist LAD LIMA graft was visualized by angiography and is normal in caliber. The graft exhibits no disease.  Intervention  No interventions have been documented.   STRESS TESTS  MYOCARDIAL PERFUSION IMAGING 07/02/2020  Narrative  Nuclear stress EF: 40%.  The left ventricular ejection fraction is moderately decreased (30-44%).  Horizontal ST segment depression ST segment depression was noted during stress in the V4, V5 and V6 leads.  T wave inversion was noted during stress.  Defect 1: There is a medium defect of moderate severity present in the apical anterior and apex location.  Findings consistent with prior myocardial infarction with peri-infarct ischemia.  This is an intermediate risk study.  There is an area at the anterior and apical portions with diminished  perfusion at rest that appears to worsen slightly with stress. This is consistent with infarct with small amount of peri-infarct ischemia. There is global hypokinesis with septal hypokinesis consistent with prior sternotomy. Intermediate risk study.   ECHOCARDIOGRAM  ECHOCARDIOGRAM COMPLETE 12/01/2021  Narrative ECHOCARDIOGRAM REPORT    Patient Name:   Daniel Buck Date of Exam: 12/01/2021 Medical Rec #:  [id number]     Height:       71.0 in Accession #:    [id number]    Weight:       211.4 lb Date of Birth:  May 03, 1965     BSA:          2.159 m Patient Age:    57 years      BP:           116/75 mmHg Patient Gender: M             HR:           66 bpm. Exam Location:  [address]  Procedure: 2D Echo, Cardiac Doppler, Color Doppler and 3D Echo  Indications:    Congestive Heart Failure-Acute Systolic I50.21  History:        Patient has prior history of Echocardiogram examinations, most recent 09/19/2021. Prior CABG; Risk Factors:Hypertension and Dyslipidemia.  Sonographer:    Thurman Coyer RDCS Referring Phys: Thomasene Ripple CLEAVER  IMPRESSIONS   1. Left ventricular ejection fraction, by estimation, is 45 to 50%. Left

## 2023-01-08 NOTE — H&P (View-Only) (Signed)
ar LV vol d, MOD    126.0 ml                   ejection A2C:                                        fraction LV vol d, MOD    92.6 ml                    by 3D A4C:                                        volume is LV vol s, MOD    68.1 ml                    45 %. A2C: LV vol s, MOD    54.2 ml A4C:                           3D Volume EF: LV SV MOD A2C:   57.9 ml       3D EF:        45 % LV SV MOD A4C:   92.6 ml       LV EDV:       148 ml LV SV MOD BP:    46.4 ml       LV ESV:       82 ml LV SV:        66 ml  RIGHT VENTRICLE RV Basal diam:  3.00 cm RV Mid diam:    2.60 cm RV S prime:     7.18 cm/s TAPSE (M-mode): 1.6 cm  LEFT ATRIUM             Index        RIGHT ATRIUM           Index LA diam:        3.90 cm 1.81 cm/m   RA Area:     17.60 cm LA Vol (A2C):   64.8 ml 30.02 ml/m  RA Volume:   42.40 ml  19.64 ml/m LA Vol (A4C):   20.6 ml 9.54 ml/m LA Biplane Vol: 37.5 ml 17.37 ml/m AORTIC VALVE LVOT Vmax:   103.00 cm/s LVOT Vmean:  67.100 cm/s LVOT VTI:    0.202 m  AORTA Ao Root diam: 3.60 cm Ao Asc diam:  3.90 cm  MITRAL VALVE MV Area (PHT): 3.99 cm    SHUNTS MV Decel Time: 190 msec    Systemic VTI:  0.20 m MV E velocity: 76.00 cm/s  Systemic Diam: 2.40 cm MV A velocity: 69.20 cm/s MV E/A ratio:  1.10  Mihai Croitoru MD Electronically signed by Thurmon Fair MD Signature Date/Time: 12/01/2021/12:21:22  PM    Final   TEE  ECHO INTRAOPERATIVE TEE 09/04/2019  Narrative *INTRAOPERATIVE TRANSESOPHAGEAL REPORT *    Patient Name:   Daniel Buck Date of Exam: 09/04/2019 Medical Rec #:  [id number]     Height:       71.0 in Accession #:    [id number]    Weight:       200.0 lb Date of Birth:  1966-02-06     BSA:  in size.  Right Coronary Artery Vessel is large. There is moderate diffuse disease throughout the vessel. Prox RCA lesion is 90% stenosed. The lesion is  eccentric. Prox RCA to Mid RCA lesion is 80% stenosed. The lesion was previously treated using a stent (unknown type) over 2 years ago. Previously placed stent displays restenosis. Mid RCA to Dist RCA lesion is 100% stenosed with 100% stenosed side branch in Acute Mrg. Dist RCA lesion is 90% stenosed.  Acute Marginal Branch Vessel is small in size.  Right Ventricular Branch Vessel is small in size.  Right Posterior Descending Artery Vessel is small in size. There is moderate disease in the vessel.  Right Posterior Atrioventricular Artery There is mild disease in the vessel.  Second Right Posterolateral Branch Vessel is small in size.  Saphenous Graft To 1st RPL SVG graft was visualized by angiography and is large. The graft exhibits mild . The flow is not reversed. There is no competitive flow  Saphenous Graft To 1st Diag SVG graft was visualized by angiography and is normal in caliber. Origin to Prox Graft lesion is 100% stenosed. The lesion is chronically occluded.  Saphenous Graft To 2nd Mrg SVG graft was visualized by angiography and is large. The graft exhibits no disease.  LIMA LIMA Graft To Dist LAD LIMA graft was visualized by angiography and is normal in caliber. The graft exhibits no disease.  Intervention  No interventions have been documented.   STRESS TESTS  MYOCARDIAL PERFUSION IMAGING 07/02/2020  Narrative  Nuclear stress EF: 40%.  The left ventricular ejection fraction is moderately decreased (30-44%).  Horizontal ST segment depression ST segment depression was noted during stress in the V4, V5 and V6 leads.  T wave inversion was noted during stress.  Defect 1: There is a medium defect of moderate severity present in the apical anterior and apex location.  Findings consistent with prior myocardial infarction with peri-infarct ischemia.  This is an intermediate risk study.  There is an area at the anterior and apical portions with diminished  perfusion at rest that appears to worsen slightly with stress. This is consistent with infarct with small amount of peri-infarct ischemia. There is global hypokinesis with septal hypokinesis consistent with prior sternotomy. Intermediate risk study.   ECHOCARDIOGRAM  ECHOCARDIOGRAM COMPLETE 12/01/2021  Narrative ECHOCARDIOGRAM REPORT    Patient Name:   Daniel Buck Date of Exam: 12/01/2021 Medical Rec #:  [id number]     Height:       71.0 in Accession #:    [id number]    Weight:       211.4 lb Date of Birth:  [birth year]     BSA:          2.159 m Patient Age:    56 years      BP:           116/75 mmHg Patient Gender: M             HR:           66 bpm. Exam Location:  [address]  Procedure: 2D Echo, Cardiac Doppler, Color Doppler and 3D Echo  Indications:    Congestive Heart Failure-Acute Systolic I50.21  History:        Patient has prior history of Echocardiogram examinations, most recent 09/19/2021. Prior CABG; Risk Factors:Hypertension and Dyslipidemia.  Sonographer:    Thurman Coyer RDCS Referring Phys: Thomasene Ripple CLEAVER  IMPRESSIONS   1. Left ventricular ejection fraction, by estimation, is 45 to 50%. Left  in size.  Right Coronary Artery Vessel is large. There is moderate diffuse disease throughout the vessel. Prox RCA lesion is 90% stenosed. The lesion is  eccentric. Prox RCA to Mid RCA lesion is 80% stenosed. The lesion was previously treated using a stent (unknown type) over 2 years ago. Previously placed stent displays restenosis. Mid RCA to Dist RCA lesion is 100% stenosed with 100% stenosed side branch in Acute Mrg. Dist RCA lesion is 90% stenosed.  Acute Marginal Branch Vessel is small in size.  Right Ventricular Branch Vessel is small in size.  Right Posterior Descending Artery Vessel is small in size. There is moderate disease in the vessel.  Right Posterior Atrioventricular Artery There is mild disease in the vessel.  Second Right Posterolateral Branch Vessel is small in size.  Saphenous Graft To 1st RPL SVG graft was visualized by angiography and is large. The graft exhibits mild . The flow is not reversed. There is no competitive flow  Saphenous Graft To 1st Diag SVG graft was visualized by angiography and is normal in caliber. Origin to Prox Graft lesion is 100% stenosed. The lesion is chronically occluded.  Saphenous Graft To 2nd Mrg SVG graft was visualized by angiography and is large. The graft exhibits no disease.  LIMA LIMA Graft To Dist LAD LIMA graft was visualized by angiography and is normal in caliber. The graft exhibits no disease.  Intervention  No interventions have been documented.   STRESS TESTS  MYOCARDIAL PERFUSION IMAGING 07/02/2020  Narrative  Nuclear stress EF: 40%.  The left ventricular ejection fraction is moderately decreased (30-44%).  Horizontal ST segment depression ST segment depression was noted during stress in the V4, V5 and V6 leads.  T wave inversion was noted during stress.  Defect 1: There is a medium defect of moderate severity present in the apical anterior and apex location.  Findings consistent with prior myocardial infarction with peri-infarct ischemia.  This is an intermediate risk study.  There is an area at the anterior and apical portions with diminished  perfusion at rest that appears to worsen slightly with stress. This is consistent with infarct with small amount of peri-infarct ischemia. There is global hypokinesis with septal hypokinesis consistent with prior sternotomy. Intermediate risk study.   ECHOCARDIOGRAM  ECHOCARDIOGRAM COMPLETE 12/01/2021  Narrative ECHOCARDIOGRAM REPORT    Patient Name:   Daniel Buck Date of Exam: 12/01/2021 Medical Rec #:  [id number]     Height:       71.0 in Accession #:    [id number]    Weight:       211.4 lb Date of Birth:  [birth year]     BSA:          2.159 m Patient Age:    56 years      BP:           116/75 mmHg Patient Gender: M             HR:           66 bpm. Exam Location:  [address]  Procedure: 2D Echo, Cardiac Doppler, Color Doppler and 3D Echo  Indications:    Congestive Heart Failure-Acute Systolic I50.21  History:        Patient has prior history of Echocardiogram examinations, most recent 09/19/2021. Prior CABG; Risk Factors:Hypertension and Dyslipidemia.  Sonographer:    Thurman Coyer RDCS Referring Phys: Thomasene Ripple CLEAVER  IMPRESSIONS   1. Left ventricular ejection fraction, by estimation, is 45 to 50%. Left  in size.  Right Coronary Artery Vessel is large. There is moderate diffuse disease throughout the vessel. Prox RCA lesion is 90% stenosed. The lesion is  eccentric. Prox RCA to Mid RCA lesion is 80% stenosed. The lesion was previously treated using a stent (unknown type) over 2 years ago. Previously placed stent displays restenosis. Mid RCA to Dist RCA lesion is 100% stenosed with 100% stenosed side branch in Acute Mrg. Dist RCA lesion is 90% stenosed.  Acute Marginal Branch Vessel is small in size.  Right Ventricular Branch Vessel is small in size.  Right Posterior Descending Artery Vessel is small in size. There is moderate disease in the vessel.  Right Posterior Atrioventricular Artery There is mild disease in the vessel.  Second Right Posterolateral Branch Vessel is small in size.  Saphenous Graft To 1st RPL SVG graft was visualized by angiography and is large. The graft exhibits mild . The flow is not reversed. There is no competitive flow  Saphenous Graft To 1st Diag SVG graft was visualized by angiography and is normal in caliber. Origin to Prox Graft lesion is 100% stenosed. The lesion is chronically occluded.  Saphenous Graft To 2nd Mrg SVG graft was visualized by angiography and is large. The graft exhibits no disease.  LIMA LIMA Graft To Dist LAD LIMA graft was visualized by angiography and is normal in caliber. The graft exhibits no disease.  Intervention  No interventions have been documented.   STRESS TESTS  MYOCARDIAL PERFUSION IMAGING 07/02/2020  Narrative  Nuclear stress EF: 40%.  The left ventricular ejection fraction is moderately decreased (30-44%).  Horizontal ST segment depression ST segment depression was noted during stress in the V4, V5 and V6 leads.  T wave inversion was noted during stress.  Defect 1: There is a medium defect of moderate severity present in the apical anterior and apex location.  Findings consistent with prior myocardial infarction with peri-infarct ischemia.  This is an intermediate risk study.  There is an area at the anterior and apical portions with diminished  perfusion at rest that appears to worsen slightly with stress. This is consistent with infarct with small amount of peri-infarct ischemia. There is global hypokinesis with septal hypokinesis consistent with prior sternotomy. Intermediate risk study.   ECHOCARDIOGRAM  ECHOCARDIOGRAM COMPLETE 12/01/2021  Narrative ECHOCARDIOGRAM REPORT    Patient Name:   Daniel Buck Date of Exam: 12/01/2021 Medical Rec #:  [id number]     Height:       71.0 in Accession #:    [id number]    Weight:       211.4 lb Date of Birth:  [birth year]     BSA:          2.159 m Patient Age:    56 years      BP:           116/75 mmHg Patient Gender: M             HR:           66 bpm. Exam Location:  [address]  Procedure: 2D Echo, Cardiac Doppler, Color Doppler and 3D Echo  Indications:    Congestive Heart Failure-Acute Systolic I50.21  History:        Patient has prior history of Echocardiogram examinations, most recent 09/19/2021. Prior CABG; Risk Factors:Hypertension and Dyslipidemia.  Sonographer:    Thurman Coyer RDCS Referring Phys: Thomasene Ripple CLEAVER  IMPRESSIONS   1. Left ventricular ejection fraction, by estimation, is 45 to 50%. Left  in size.  Right Coronary Artery Vessel is large. There is moderate diffuse disease throughout the vessel. Prox RCA lesion is 90% stenosed. The lesion is  eccentric. Prox RCA to Mid RCA lesion is 80% stenosed. The lesion was previously treated using a stent (unknown type) over 2 years ago. Previously placed stent displays restenosis. Mid RCA to Dist RCA lesion is 100% stenosed with 100% stenosed side branch in Acute Mrg. Dist RCA lesion is 90% stenosed.  Acute Marginal Branch Vessel is small in size.  Right Ventricular Branch Vessel is small in size.  Right Posterior Descending Artery Vessel is small in size. There is moderate disease in the vessel.  Right Posterior Atrioventricular Artery There is mild disease in the vessel.  Second Right Posterolateral Branch Vessel is small in size.  Saphenous Graft To 1st RPL SVG graft was visualized by angiography and is large. The graft exhibits mild . The flow is not reversed. There is no competitive flow  Saphenous Graft To 1st Diag SVG graft was visualized by angiography and is normal in caliber. Origin to Prox Graft lesion is 100% stenosed. The lesion is chronically occluded.  Saphenous Graft To 2nd Mrg SVG graft was visualized by angiography and is large. The graft exhibits no disease.  LIMA LIMA Graft To Dist LAD LIMA graft was visualized by angiography and is normal in caliber. The graft exhibits no disease.  Intervention  No interventions have been documented.   STRESS TESTS  MYOCARDIAL PERFUSION IMAGING 07/02/2020  Narrative  Nuclear stress EF: 40%.  The left ventricular ejection fraction is moderately decreased (30-44%).  Horizontal ST segment depression ST segment depression was noted during stress in the V4, V5 and V6 leads.  T wave inversion was noted during stress.  Defect 1: There is a medium defect of moderate severity present in the apical anterior and apex location.  Findings consistent with prior myocardial infarction with peri-infarct ischemia.  This is an intermediate risk study.  There is an area at the anterior and apical portions with diminished  perfusion at rest that appears to worsen slightly with stress. This is consistent with infarct with small amount of peri-infarct ischemia. There is global hypokinesis with septal hypokinesis consistent with prior sternotomy. Intermediate risk study.   ECHOCARDIOGRAM  ECHOCARDIOGRAM COMPLETE 12/01/2021  Narrative ECHOCARDIOGRAM REPORT    Patient Name:   Daniel Buck Date of Exam: 12/01/2021 Medical Rec #:  [id number]     Height:       71.0 in Accession #:    [id number]    Weight:       211.4 lb Date of Birth:  [birth year]     BSA:          2.159 m Patient Age:    56 years      BP:           116/75 mmHg Patient Gender: M             HR:           66 bpm. Exam Location:  [address]  Procedure: 2D Echo, Cardiac Doppler, Color Doppler and 3D Echo  Indications:    Congestive Heart Failure-Acute Systolic I50.21  History:        Patient has prior history of Echocardiogram examinations, most recent 09/19/2021. Prior CABG; Risk Factors:Hypertension and Dyslipidemia.  Sonographer:    Thurman Coyer RDCS Referring Phys: Thomasene Ripple CLEAVER  IMPRESSIONS   1. Left ventricular ejection fraction, by estimation, is 45 to 50%. Left  ar LV vol d, MOD    126.0 ml                   ejection A2C:                                        fraction LV vol d, MOD    92.6 ml                    by 3D A4C:                                        volume is LV vol s, MOD    68.1 ml                    45 %. A2C: LV vol s, MOD    54.2 ml A4C:                           3D Volume EF: LV SV MOD A2C:   57.9 ml       3D EF:        45 % LV SV MOD A4C:   92.6 ml       LV EDV:       148 ml LV SV MOD BP:    46.4 ml       LV ESV:       82 ml LV SV:        66 ml  RIGHT VENTRICLE RV Basal diam:  3.00 cm RV Mid diam:    2.60 cm RV S prime:     7.18 cm/s TAPSE (M-mode): 1.6 cm  LEFT ATRIUM             Index        RIGHT ATRIUM           Index LA diam:        3.90 cm 1.81 cm/m   RA Area:     17.60 cm LA Vol (A2C):   64.8 ml 30.02 ml/m  RA Volume:   42.40 ml  19.64 ml/m LA Vol (A4C):   20.6 ml 9.54 ml/m LA Biplane Vol: 37.5 ml 17.37 ml/m AORTIC VALVE LVOT Vmax:   103.00 cm/s LVOT Vmean:  67.100 cm/s LVOT VTI:    0.202 m  AORTA Ao Root diam: 3.60 cm Ao Asc diam:  3.90 cm  MITRAL VALVE MV Area (PHT): 3.99 cm    SHUNTS MV Decel Time: 190 msec    Systemic VTI:  0.20 m MV E velocity: 76.00 cm/s  Systemic Diam: 2.40 cm MV A velocity: 69.20 cm/s MV E/A ratio:  1.10  Mihai Croitoru MD Electronically signed by Thurmon Fair MD Signature Date/Time: 12/01/2021/12:21:22  PM    Final   TEE  ECHO INTRAOPERATIVE TEE 09/04/2019  Narrative *INTRAOPERATIVE TRANSESOPHAGEAL REPORT *    Patient Name:   Daniel Buck Date of Exam: 09/04/2019 Medical Rec #:  [id number]     Height:       71.0 in Accession #:    [id number]    Weight:       200.0 lb Date of Birth:  1966-02-06     BSA:  ar LV vol d, MOD    126.0 ml                   ejection A2C:                                        fraction LV vol d, MOD    92.6 ml                    by 3D A4C:                                        volume is LV vol s, MOD    68.1 ml                    45 %. A2C: LV vol s, MOD    54.2 ml A4C:                           3D Volume EF: LV SV MOD A2C:   57.9 ml       3D EF:        45 % LV SV MOD A4C:   92.6 ml       LV EDV:       148 ml LV SV MOD BP:    46.4 ml       LV ESV:       82 ml LV SV:        66 ml  RIGHT VENTRICLE RV Basal diam:  3.00 cm RV Mid diam:    2.60 cm RV S prime:     7.18 cm/s TAPSE (M-mode): 1.6 cm  LEFT ATRIUM             Index        RIGHT ATRIUM           Index LA diam:        3.90 cm 1.81 cm/m   RA Area:     17.60 cm LA Vol (A2C):   64.8 ml 30.02 ml/m  RA Volume:   42.40 ml  19.64 ml/m LA Vol (A4C):   20.6 ml 9.54 ml/m LA Biplane Vol: 37.5 ml 17.37 ml/m AORTIC VALVE LVOT Vmax:   103.00 cm/s LVOT Vmean:  67.100 cm/s LVOT VTI:    0.202 m  AORTA Ao Root diam: 3.60 cm Ao Asc diam:  3.90 cm  MITRAL VALVE MV Area (PHT): 3.99 cm    SHUNTS MV Decel Time: 190 msec    Systemic VTI:  0.20 m MV E velocity: 76.00 cm/s  Systemic Diam: 2.40 cm MV A velocity: 69.20 cm/s MV E/A ratio:  1.10  Mihai Croitoru MD Electronically signed by Thurmon Fair MD Signature Date/Time: 12/01/2021/12:21:22  PM    Final   TEE  ECHO INTRAOPERATIVE TEE 09/04/2019  Narrative *INTRAOPERATIVE TRANSESOPHAGEAL REPORT *    Patient Name:   Daniel Buck Date of Exam: 09/04/2019 Medical Rec #:  [id number]     Height:       71.0 in Accession #:    [id number]    Weight:       200.0 lb Date of Birth:  1966-02-06     BSA:  ar LV vol d, MOD    126.0 ml                   ejection A2C:                                        fraction LV vol d, MOD    92.6 ml                    by 3D A4C:                                        volume is LV vol s, MOD    68.1 ml                    45 %. A2C: LV vol s, MOD    54.2 ml A4C:                           3D Volume EF: LV SV MOD A2C:   57.9 ml       3D EF:        45 % LV SV MOD A4C:   92.6 ml       LV EDV:       148 ml LV SV MOD BP:    46.4 ml       LV ESV:       82 ml LV SV:        66 ml  RIGHT VENTRICLE RV Basal diam:  3.00 cm RV Mid diam:    2.60 cm RV S prime:     7.18 cm/s TAPSE (M-mode): 1.6 cm  LEFT ATRIUM             Index        RIGHT ATRIUM           Index LA diam:        3.90 cm 1.81 cm/m   RA Area:     17.60 cm LA Vol (A2C):   64.8 ml 30.02 ml/m  RA Volume:   42.40 ml  19.64 ml/m LA Vol (A4C):   20.6 ml 9.54 ml/m LA Biplane Vol: 37.5 ml 17.37 ml/m AORTIC VALVE LVOT Vmax:   103.00 cm/s LVOT Vmean:  67.100 cm/s LVOT VTI:    0.202 m  AORTA Ao Root diam: 3.60 cm Ao Asc diam:  3.90 cm  MITRAL VALVE MV Area (PHT): 3.99 cm    SHUNTS MV Decel Time: 190 msec    Systemic VTI:  0.20 m MV E velocity: 76.00 cm/s  Systemic Diam: 2.40 cm MV A velocity: 69.20 cm/s MV E/A ratio:  1.10  Mihai Croitoru MD Electronically signed by Thurmon Fair MD Signature Date/Time: 12/01/2021/12:21:22  PM    Final   TEE  ECHO INTRAOPERATIVE TEE 09/04/2019  Narrative *INTRAOPERATIVE TRANSESOPHAGEAL REPORT *    Patient Name:   Daniel Buck Date of Exam: 09/04/2019 Medical Rec #:  [id number]     Height:       71.0 in Accession #:    [id number]    Weight:       200.0 lb Date of Birth:  1966-02-06     BSA:  in size.  Right Coronary Artery Vessel is large. There is moderate diffuse disease throughout the vessel. Prox RCA lesion is 90% stenosed. The lesion is  eccentric. Prox RCA to Mid RCA lesion is 80% stenosed. The lesion was previously treated using a stent (unknown type) over 2 years ago. Previously placed stent displays restenosis. Mid RCA to Dist RCA lesion is 100% stenosed with 100% stenosed side branch in Acute Mrg. Dist RCA lesion is 90% stenosed.  Acute Marginal Branch Vessel is small in size.  Right Ventricular Branch Vessel is small in size.  Right Posterior Descending Artery Vessel is small in size. There is moderate disease in the vessel.  Right Posterior Atrioventricular Artery There is mild disease in the vessel.  Second Right Posterolateral Branch Vessel is small in size.  Saphenous Graft To 1st RPL SVG graft was visualized by angiography and is large. The graft exhibits mild . The flow is not reversed. There is no competitive flow  Saphenous Graft To 1st Diag SVG graft was visualized by angiography and is normal in caliber. Origin to Prox Graft lesion is 100% stenosed. The lesion is chronically occluded.  Saphenous Graft To 2nd Mrg SVG graft was visualized by angiography and is large. The graft exhibits no disease.  LIMA LIMA Graft To Dist LAD LIMA graft was visualized by angiography and is normal in caliber. The graft exhibits no disease.  Intervention  No interventions have been documented.   STRESS TESTS  MYOCARDIAL PERFUSION IMAGING 07/02/2020  Narrative  Nuclear stress EF: 40%.  The left ventricular ejection fraction is moderately decreased (30-44%).  Horizontal ST segment depression ST segment depression was noted during stress in the V4, V5 and V6 leads.  T wave inversion was noted during stress.  Defect 1: There is a medium defect of moderate severity present in the apical anterior and apex location.  Findings consistent with prior myocardial infarction with peri-infarct ischemia.  This is an intermediate risk study.  There is an area at the anterior and apical portions with diminished  perfusion at rest that appears to worsen slightly with stress. This is consistent with infarct with small amount of peri-infarct ischemia. There is global hypokinesis with septal hypokinesis consistent with prior sternotomy. Intermediate risk study.   ECHOCARDIOGRAM  ECHOCARDIOGRAM COMPLETE 12/01/2021  Narrative ECHOCARDIOGRAM REPORT    Patient Name:   Daniel Buck Date of Exam: 12/01/2021 Medical Rec #:  [id number]     Height:       71.0 in Accession #:    [id number]    Weight:       211.4 lb Date of Birth:  [birth year]     BSA:          2.159 m Patient Age:    56 years      BP:           116/75 mmHg Patient Gender: M             HR:           66 bpm. Exam Location:  [address]  Procedure: 2D Echo, Cardiac Doppler, Color Doppler and 3D Echo  Indications:    Congestive Heart Failure-Acute Systolic I50.21  History:        Patient has prior history of Echocardiogram examinations, most recent 09/19/2021. Prior CABG; Risk Factors:Hypertension and Dyslipidemia.  Sonographer:    Thurman Coyer RDCS Referring Phys: Thomasene Ripple CLEAVER  IMPRESSIONS   1. Left ventricular ejection fraction, by estimation, is 45 to 50%. Left  in size.  Right Coronary Artery Vessel is large. There is moderate diffuse disease throughout the vessel. Prox RCA lesion is 90% stenosed. The lesion is  eccentric. Prox RCA to Mid RCA lesion is 80% stenosed. The lesion was previously treated using a stent (unknown type) over 2 years ago. Previously placed stent displays restenosis. Mid RCA to Dist RCA lesion is 100% stenosed with 100% stenosed side branch in Acute Mrg. Dist RCA lesion is 90% stenosed.  Acute Marginal Branch Vessel is small in size.  Right Ventricular Branch Vessel is small in size.  Right Posterior Descending Artery Vessel is small in size. There is moderate disease in the vessel.  Right Posterior Atrioventricular Artery There is mild disease in the vessel.  Second Right Posterolateral Branch Vessel is small in size.  Saphenous Graft To 1st RPL SVG graft was visualized by angiography and is large. The graft exhibits mild . The flow is not reversed. There is no competitive flow  Saphenous Graft To 1st Diag SVG graft was visualized by angiography and is normal in caliber. Origin to Prox Graft lesion is 100% stenosed. The lesion is chronically occluded.  Saphenous Graft To 2nd Mrg SVG graft was visualized by angiography and is large. The graft exhibits no disease.  LIMA LIMA Graft To Dist LAD LIMA graft was visualized by angiography and is normal in caliber. The graft exhibits no disease.  Intervention  No interventions have been documented.   STRESS TESTS  MYOCARDIAL PERFUSION IMAGING 07/02/2020  Narrative  Nuclear stress EF: 40%.  The left ventricular ejection fraction is moderately decreased (30-44%).  Horizontal ST segment depression ST segment depression was noted during stress in the V4, V5 and V6 leads.  T wave inversion was noted during stress.  Defect 1: There is a medium defect of moderate severity present in the apical anterior and apex location.  Findings consistent with prior myocardial infarction with peri-infarct ischemia.  This is an intermediate risk study.  There is an area at the anterior and apical portions with diminished  perfusion at rest that appears to worsen slightly with stress. This is consistent with infarct with small amount of peri-infarct ischemia. There is global hypokinesis with septal hypokinesis consistent with prior sternotomy. Intermediate risk study.   ECHOCARDIOGRAM  ECHOCARDIOGRAM COMPLETE 12/01/2021  Narrative ECHOCARDIOGRAM REPORT    Patient Name:   Daniel Buck Date of Exam: 12/01/2021 Medical Rec #:  [id number]     Height:       71.0 in Accession #:    [id number]    Weight:       211.4 lb Date of Birth:  [birth year]     BSA:          2.159 m Patient Age:    56 years      BP:           116/75 mmHg Patient Gender: M             HR:           66 bpm. Exam Location:  [address]  Procedure: 2D Echo, Cardiac Doppler, Color Doppler and 3D Echo  Indications:    Congestive Heart Failure-Acute Systolic I50.21  History:        Patient has prior history of Echocardiogram examinations, most recent 09/19/2021. Prior CABG; Risk Factors:Hypertension and Dyslipidemia.  Sonographer:    Thurman Coyer RDCS Referring Phys: Thomasene Ripple CLEAVER  IMPRESSIONS   1. Left ventricular ejection fraction, by estimation, is 45 to 50%. Left  in size.  Right Coronary Artery Vessel is large. There is moderate diffuse disease throughout the vessel. Prox RCA lesion is 90% stenosed. The lesion is  eccentric. Prox RCA to Mid RCA lesion is 80% stenosed. The lesion was previously treated using a stent (unknown type) over 2 years ago. Previously placed stent displays restenosis. Mid RCA to Dist RCA lesion is 100% stenosed with 100% stenosed side branch in Acute Mrg. Dist RCA lesion is 90% stenosed.  Acute Marginal Branch Vessel is small in size.  Right Ventricular Branch Vessel is small in size.  Right Posterior Descending Artery Vessel is small in size. There is moderate disease in the vessel.  Right Posterior Atrioventricular Artery There is mild disease in the vessel.  Second Right Posterolateral Branch Vessel is small in size.  Saphenous Graft To 1st RPL SVG graft was visualized by angiography and is large. The graft exhibits mild . The flow is not reversed. There is no competitive flow  Saphenous Graft To 1st Diag SVG graft was visualized by angiography and is normal in caliber. Origin to Prox Graft lesion is 100% stenosed. The lesion is chronically occluded.  Saphenous Graft To 2nd Mrg SVG graft was visualized by angiography and is large. The graft exhibits no disease.  LIMA LIMA Graft To Dist LAD LIMA graft was visualized by angiography and is normal in caliber. The graft exhibits no disease.  Intervention  No interventions have been documented.   STRESS TESTS  MYOCARDIAL PERFUSION IMAGING 07/02/2020  Narrative  Nuclear stress EF: 40%.  The left ventricular ejection fraction is moderately decreased (30-44%).  Horizontal ST segment depression ST segment depression was noted during stress in the V4, V5 and V6 leads.  T wave inversion was noted during stress.  Defect 1: There is a medium defect of moderate severity present in the apical anterior and apex location.  Findings consistent with prior myocardial infarction with peri-infarct ischemia.  This is an intermediate risk study.  There is an area at the anterior and apical portions with diminished  perfusion at rest that appears to worsen slightly with stress. This is consistent with infarct with small amount of peri-infarct ischemia. There is global hypokinesis with septal hypokinesis consistent with prior sternotomy. Intermediate risk study.   ECHOCARDIOGRAM  ECHOCARDIOGRAM COMPLETE 12/01/2021  Narrative ECHOCARDIOGRAM REPORT    Patient Name:   Daniel Buck Date of Exam: 12/01/2021 Medical Rec #:  [id number]     Height:       71.0 in Accession #:    [id number]    Weight:       211.4 lb Date of Birth:  [birth year]     BSA:          2.159 m Patient Age:    56 years      BP:           116/75 mmHg Patient Gender: M             HR:           66 bpm. Exam Location:  [address]  Procedure: 2D Echo, Cardiac Doppler, Color Doppler and 3D Echo  Indications:    Congestive Heart Failure-Acute Systolic I50.21  History:        Patient has prior history of Echocardiogram examinations, most recent 09/19/2021. Prior CABG; Risk Factors:Hypertension and Dyslipidemia.  Sonographer:    Thurman Coyer RDCS Referring Phys: Thomasene Ripple CLEAVER  IMPRESSIONS   1. Left ventricular ejection fraction, by estimation, is 45 to 50%. Left  ar LV vol d, MOD    126.0 ml                   ejection A2C:                                        fraction LV vol d, MOD    92.6 ml                    by 3D A4C:                                        volume is LV vol s, MOD    68.1 ml                    45 %. A2C: LV vol s, MOD    54.2 ml A4C:                           3D Volume EF: LV SV MOD A2C:   57.9 ml       3D EF:        45 % LV SV MOD A4C:   92.6 ml       LV EDV:       148 ml LV SV MOD BP:    46.4 ml       LV ESV:       82 ml LV SV:        66 ml  RIGHT VENTRICLE RV Basal diam:  3.00 cm RV Mid diam:    2.60 cm RV S prime:     7.18 cm/s TAPSE (M-mode): 1.6 cm  LEFT ATRIUM             Index        RIGHT ATRIUM           Index LA diam:        3.90 cm 1.81 cm/m   RA Area:     17.60 cm LA Vol (A2C):   64.8 ml 30.02 ml/m  RA Volume:   42.40 ml  19.64 ml/m LA Vol (A4C):   20.6 ml 9.54 ml/m LA Biplane Vol: 37.5 ml 17.37 ml/m AORTIC VALVE LVOT Vmax:   103.00 cm/s LVOT Vmean:  67.100 cm/s LVOT VTI:    0.202 m  AORTA Ao Root diam: 3.60 cm Ao Asc diam:  3.90 cm  MITRAL VALVE MV Area (PHT): 3.99 cm    SHUNTS MV Decel Time: 190 msec    Systemic VTI:  0.20 m MV E velocity: 76.00 cm/s  Systemic Diam: 2.40 cm MV A velocity: 69.20 cm/s MV E/A ratio:  1.10  Mihai Croitoru MD Electronically signed by Thurmon Fair MD Signature Date/Time: 12/01/2021/12:21:22  PM    Final   TEE  ECHO INTRAOPERATIVE TEE 09/04/2019  Narrative *INTRAOPERATIVE TRANSESOPHAGEAL REPORT *    Patient Name:   Daniel Buck Date of Exam: 09/04/2019 Medical Rec #:  [id number]     Height:       71.0 in Accession #:    [id number]    Weight:       200.0 lb Date of Birth:  1966-02-06     BSA:

## 2023-01-09 ENCOUNTER — Telehealth: Payer: Self-pay | Admitting: *Deleted

## 2023-01-09 LAB — BASIC METABOLIC PANEL
BUN/Creatinine Ratio: 14 (ref 9–20)
BUN: 19 mg/dL (ref 6–24)
CO2: 23 mmol/L (ref 20–29)
Calcium: 10 mg/dL (ref 8.7–10.2)
Chloride: 100 mmol/L (ref 96–106)
Creatinine, Ser: 1.34 mg/dL — ABNORMAL HIGH (ref 0.76–1.27)
Glucose: 97 mg/dL (ref 70–99)
Potassium: 4.7 mmol/L (ref 3.5–5.2)
Sodium: 138 mmol/L (ref 134–144)
eGFR: 62 mL/min/{1.73_m2} (ref >59)

## 2023-01-09 LAB — CBC
Hematocrit: 55.4 % — ABNORMAL HIGH (ref 37.5–51.0)
Hemoglobin: 16.7 g/dL (ref 13.0–17.7)
MCH: 24.2 pg — ABNORMAL LOW (ref 26.6–33.0)
MCHC: 30.1 g/dL — ABNORMAL LOW (ref 31.5–35.7)
MCV: 80 fL (ref 79–97)
Platelets: 249 10*3/uL (ref 150–450)
RBC: 6.91 x10E6/uL — ABNORMAL HIGH (ref 4.14–5.80)
RDW: 18.8 % — ABNORMAL HIGH (ref 11.6–15.4)
WBC: 8.6 10*3/uL (ref 3.4–10.8)

## 2023-01-09 NOTE — Telephone Encounter (Signed)
No answer, voicemail message.

## 2023-01-09 NOTE — Telephone Encounter (Signed)
Reviewed procedure instructions with patient.

## 2023-01-09 NOTE — Telephone Encounter (Signed)
Cardiac Catheterization scheduled at [facility] for: Wednesday January 10, 2023 2:30 PM Arrival time [facility] Main Entrance A at: 12:30 PM  Nothing to eat after midnight prior to procedure, clear liquids until 5 AM day of procedure.  Medication instructions: -Hold:  Jardiance-AM of procedure -Other usual morning medications can be taken with sips of water including aspirin 81 mg and Brilinta 90 mg  Plan to go home the same day, you will only stay overnight if medically necessary.  You must have responsible adult to drive you home.  Someone must be with you the first 24 hours after you arrive home.  Left message for patient to call back to review procedure instructions

## 2023-01-10 ENCOUNTER — Ambulatory Visit (HOSPITAL_COMMUNITY)
Admission: RE | Admit: 2023-01-10 | Discharge: 2023-01-10 | Disposition: A | Discharge status: Home / Self Care | Payer: Commercial Managed Care - PPO | Attending: Cardiology | Admitting: Cardiology

## 2023-01-10 ENCOUNTER — Encounter (HOSPITAL_COMMUNITY)
Admission: RE | Disposition: A | Discharge status: Home / Self Care | Payer: Self-pay | Source: Home / Self Care | Attending: Cardiology

## 2023-01-10 ENCOUNTER — Other Ambulatory Visit: Payer: Self-pay

## 2023-01-10 DIAGNOSIS — R0602 Shortness of breath: Secondary | ICD-10-CM

## 2023-01-10 DIAGNOSIS — I2582 Chronic total occlusion of coronary artery: Secondary | ICD-10-CM | POA: Diagnosis not present

## 2023-01-10 DIAGNOSIS — I25110 Atherosclerotic heart disease of native coronary artery with unstable angina pectoris: Secondary | ICD-10-CM

## 2023-01-10 DIAGNOSIS — E785 Hyperlipidemia, unspecified: Secondary | ICD-10-CM | POA: Insufficient documentation

## 2023-01-10 DIAGNOSIS — Z79899 Other long term (current) drug therapy: Secondary | ICD-10-CM | POA: Insufficient documentation

## 2023-01-10 DIAGNOSIS — K219 Gastro-esophageal reflux disease without esophagitis: Secondary | ICD-10-CM | POA: Insufficient documentation

## 2023-01-10 DIAGNOSIS — Z7982 Long term (current) use of aspirin: Secondary | ICD-10-CM | POA: Diagnosis not present

## 2023-01-10 DIAGNOSIS — I252 Old myocardial infarction: Secondary | ICD-10-CM | POA: Insufficient documentation

## 2023-01-10 DIAGNOSIS — Z7902 Long term (current) use of antithrombotics/antiplatelets: Secondary | ICD-10-CM | POA: Insufficient documentation

## 2023-01-10 DIAGNOSIS — I10 Essential (primary) hypertension: Secondary | ICD-10-CM | POA: Insufficient documentation

## 2023-01-10 DIAGNOSIS — I255 Ischemic cardiomyopathy: Secondary | ICD-10-CM | POA: Diagnosis not present

## 2023-01-10 DIAGNOSIS — R0609 Other forms of dyspnea: Secondary | ICD-10-CM | POA: Diagnosis not present

## 2023-01-10 DIAGNOSIS — Z951 Presence of aortocoronary bypass graft: Secondary | ICD-10-CM | POA: Diagnosis not present

## 2023-01-10 DIAGNOSIS — I25118 Atherosclerotic heart disease of native coronary artery with other forms of angina pectoris: Secondary | ICD-10-CM | POA: Diagnosis not present

## 2023-01-10 HISTORY — PX: LEFT HEART CATH AND CORS/GRAFTS ANGIOGRAPHY: CATH118250

## 2023-01-10 SURGERY — LEFT HEART CATH AND CORS/GRAFTS ANGIOGRAPHY
Anesthesia: LOCAL

## 2023-01-10 MED ORDER — MIDAZOLAM HCL 2 MG/2ML IJ SOLN
INTRAMUSCULAR | Status: DC | PRN
  Administered 2023-01-10: 2 mg via INTRAVENOUS

## 2023-01-10 MED ORDER — HEPARIN SODIUM (PORCINE) 1000 UNIT/ML IJ SOLN
INTRAMUSCULAR | Status: DC | PRN
  Administered 2023-01-10: 4500 [IU] via INTRAVENOUS

## 2023-01-10 MED ORDER — VERAPAMIL HCL 2.5 MG/ML IV SOLN
INTRAVENOUS | Status: AC
  Filled 2023-01-10: qty 2

## 2023-01-10 MED ORDER — MIDAZOLAM HCL 2 MG/2ML IJ SOLN
INTRAMUSCULAR | Status: AC
  Filled 2023-01-10: qty 2

## 2023-01-10 MED ORDER — LIDOCAINE HCL (PF) 1 % IJ SOLN
INTRAMUSCULAR | Status: DC | PRN
  Administered 2023-01-10: 2 mL

## 2023-01-10 MED ORDER — ONDANSETRON HCL 4 MG/2ML IJ SOLN: 4.0000 mg | Freq: Four times a day (QID) | INTRAMUSCULAR | Status: DC | PRN

## 2023-01-10 MED ORDER — SODIUM CHLORIDE 0.9 % WEIGHT BASED INFUSION: 1.0000 mL/kg/h | INTRAVENOUS | Status: DC

## 2023-01-10 MED ORDER — HYDRALAZINE HCL 20 MG/ML IJ SOLN: 10.0000 mg | INTRAMUSCULAR | Status: DC | PRN

## 2023-01-10 MED ORDER — HEPARIN SODIUM (PORCINE) 1000 UNIT/ML IJ SOLN
INTRAMUSCULAR | Status: AC
  Filled 2023-01-10: qty 10

## 2023-01-10 MED ORDER — ACETAMINOPHEN 325 MG PO TABS: 650.0000 mg | ORAL_TABLET | ORAL | Status: DC | PRN

## 2023-01-10 MED ORDER — SODIUM CHLORIDE 0.9% FLUSH: 3.0000 mL | INTRAVENOUS | Status: DC | PRN

## 2023-01-10 MED ORDER — IOHEXOL 350 MG/ML SOLN
INTRAVENOUS | Status: DC | PRN
  Administered 2023-01-10: 125 mL

## 2023-01-10 MED ORDER — FENTANYL CITRATE (PF) 100 MCG/2ML IJ SOLN
INTRAMUSCULAR | Status: AC
  Filled 2023-01-10: qty 2

## 2023-01-10 MED ORDER — HEPARIN (PORCINE) IN NACL 1000-0.9 UT/500ML-% IV SOLN
INTRAVENOUS | Status: DC | PRN
  Administered 2023-01-10: 1000 mL

## 2023-01-10 MED ORDER — SODIUM CHLORIDE 0.9% FLUSH: 3.0000 mL | Freq: Two times a day (BID) | INTRAVENOUS | Status: DC

## 2023-01-10 MED ORDER — VERAPAMIL HCL 2.5 MG/ML IV SOLN
INTRAVENOUS | Status: DC | PRN
  Administered 2023-01-10: 10 mL via INTRA_ARTERIAL

## 2023-01-10 MED ORDER — LIDOCAINE HCL (PF) 1 % IJ SOLN
INTRAMUSCULAR | Status: AC
  Filled 2023-01-10: qty 30

## 2023-01-10 MED ORDER — SODIUM CHLORIDE 0.9 % IV SOLN: 250.0000 mL | INTRAVENOUS | Status: DC | PRN

## 2023-01-10 MED ORDER — ASPIRIN 81 MG PO CHEW: 81.0000 mg | CHEWABLE_TABLET | ORAL | Status: DC

## 2023-01-10 MED ORDER — LABETALOL HCL 5 MG/ML IV SOLN: 10.0000 mg | INTRAVENOUS | Status: DC | PRN

## 2023-01-10 MED ORDER — FENTANYL CITRATE (PF) 100 MCG/2ML IJ SOLN
INTRAMUSCULAR | Status: DC | PRN
  Administered 2023-01-10: 25 ug via INTRAVENOUS

## 2023-01-10 MED ORDER — SODIUM CHLORIDE 0.9 % WEIGHT BASED INFUSION
3.0000 mL/kg/h | INTRAVENOUS | Status: AC
  Administered 2023-01-10: 3 mL/kg/h via INTRAVENOUS

## 2023-01-10 SURGICAL SUPPLY — 12 items
CATH INFINITI 5 FR IM (CATHETERS) IMPLANT
CATH INFINITI 5FR AL1 (CATHETERS) IMPLANT
CATH INFINITI 5FR MULTPACK ANG (CATHETERS) IMPLANT
DEVICE RAD COMP TR BAND LRG (VASCULAR PRODUCTS) IMPLANT
ELECT DEFIB PAD ADLT CADENCE (PAD) IMPLANT
GLIDESHEATH SLEND SS 6F .021 (SHEATH) IMPLANT
GUIDEWIRE INQWIRE 1.5J.035X260 (WIRE) IMPLANT
INQWIRE 1.5J .035X260CM (WIRE) ×1
KIT SYRINGE INJ CVI SPIKEX1 (MISCELLANEOUS) IMPLANT
PACK CARDIAC CATHETERIZATION (CUSTOM PROCEDURE TRAY) ×1 IMPLANT
SET ATX-X65L (MISCELLANEOUS) IMPLANT
SHEATH PROBE COVER 6X72 (BAG) IMPLANT

## 2023-01-10 NOTE — Interval H&P Note (Signed)
History and Physical Interval Note:  01/10/2023 8:45 AM  Daniel Buck  has presented today for surgery, with the diagnosis of history of cabg - cad with worsening DOE & chest pain.  The various methods of treatment have been discussed with the patient and family. After consideration of risks, benefits and other options for treatment, the patient has consented to  Procedure(s): LEFT HEART CATH AND CORS/GRAFTS ANGIOGRAPHY (N/A)  PERCUTANEOUS CORONARY INTERVENTION   as a surgical intervention.  The patient's history has been reviewed, patient examined, no change in status, stable for surgery.  I have reviewed the patient's chart and labs.  Questions were answered to the patient's satisfaction.    Cath Lab Visit (complete for each Cath Lab visit)  Clinical Evaluation Leading to the Procedure:   ACS: No.  Non-ACS:    Anginal Classification: CCS II  Anti-ischemic medical therapy: Maximal Therapy (2 or more classes of medications)  Non-Invasive Test Results: No non-invasive testing performed  Prior CABG: Previous CABG     Bryan Lemma

## 2023-01-10 NOTE — Discharge Instructions (Signed)

## 2023-01-11 ENCOUNTER — Encounter (HOSPITAL_COMMUNITY): Payer: Self-pay | Admitting: Cardiology

## 2023-01-15 ENCOUNTER — Other Ambulatory Visit (HOSPITAL_COMMUNITY): Payer: Self-pay

## 2023-01-15 ENCOUNTER — Other Ambulatory Visit: Payer: Self-pay

## 2023-01-17 NOTE — Plan of Care (Signed)
[facility] Tonsillectomy/Adenoidectomy, Postoperative PEDS care plan entered in error.

## 2023-01-18 ENCOUNTER — Ambulatory Visit (HOSPITAL_COMMUNITY): Admit: 2023-01-18 | Payer: Commercial Managed Care - PPO

## 2023-01-24 ENCOUNTER — Ambulatory Visit: Payer: Commercial Managed Care - PPO | Admitting: Nurse Practitioner

## 2023-01-25 ENCOUNTER — Other Ambulatory Visit (HOSPITAL_COMMUNITY): Payer: Self-pay

## 2023-01-26 ENCOUNTER — Other Ambulatory Visit (HOSPITAL_COMMUNITY): Payer: Self-pay

## 2023-01-26 ENCOUNTER — Ambulatory Visit (HOSPITAL_COMMUNITY)
Admission: RE | Admit: 2023-01-26 | Discharge: 2023-01-26 | Disposition: A | Discharge status: Home / Self Care | Payer: Commercial Managed Care - PPO | Source: Ambulatory Visit | Attending: Family Medicine | Admitting: Family Medicine

## 2023-01-26 DIAGNOSIS — I2510 Atherosclerotic heart disease of native coronary artery without angina pectoris: Secondary | ICD-10-CM | POA: Insufficient documentation

## 2023-01-26 DIAGNOSIS — I119 Hypertensive heart disease without heart failure: Secondary | ICD-10-CM | POA: Diagnosis not present

## 2023-01-26 DIAGNOSIS — I255 Ischemic cardiomyopathy: Secondary | ICD-10-CM

## 2023-01-26 DIAGNOSIS — I252 Old myocardial infarction: Secondary | ICD-10-CM | POA: Diagnosis not present

## 2023-01-26 DIAGNOSIS — E785 Hyperlipidemia, unspecified: Secondary | ICD-10-CM | POA: Diagnosis not present

## 2023-01-26 DIAGNOSIS — I25110 Atherosclerotic heart disease of native coronary artery with unstable angina pectoris: Secondary | ICD-10-CM | POA: Diagnosis not present

## 2023-01-26 HISTORY — PX: TRANSTHORACIC ECHOCARDIOGRAM: SHX275

## 2023-01-26 LAB — ECHOCARDIOGRAM COMPLETE
AR max vel: 3.07 cm2
AV Area VTI: 3.23 cm2
AV Area mean vel: 2.89 cm2
AV Mean grad: 3.5 mm[Hg]
AV Peak grad: 6.3 mm[Hg]
Ao pk vel: 1.26 m/s
Area-P 1/2: 3.6 cm2
Calc EF: 57 %
S' Lateral: 3.7 cm
Single Plane A2C EF: 55.8 %
Single Plane A4C EF: 57.9 %

## 2023-01-29 ENCOUNTER — Other Ambulatory Visit: Payer: Self-pay

## 2023-01-29 ENCOUNTER — Other Ambulatory Visit (HOSPITAL_COMMUNITY): Payer: Self-pay

## 2023-01-30 DIAGNOSIS — D485 Neoplasm of uncertain behavior of skin: Secondary | ICD-10-CM | POA: Diagnosis not present

## 2023-02-01 DIAGNOSIS — E291 Testicular hypofunction: Secondary | ICD-10-CM | POA: Diagnosis not present

## 2023-02-01 DIAGNOSIS — Z6829 Body mass index (BMI) 29.0-29.9, adult: Secondary | ICD-10-CM | POA: Diagnosis not present

## 2023-02-02 ENCOUNTER — Ambulatory Visit: Payer: Commercial Managed Care - PPO | Admitting: Nurse Practitioner

## 2023-02-13 ENCOUNTER — Other Ambulatory Visit: Payer: Self-pay | Admitting: Cardiology

## 2023-02-14 ENCOUNTER — Other Ambulatory Visit (HOSPITAL_COMMUNITY): Payer: Self-pay

## 2023-02-14 ENCOUNTER — Telehealth: Payer: Self-pay | Admitting: *Deleted

## 2023-02-14 ENCOUNTER — Other Ambulatory Visit: Payer: Self-pay

## 2023-02-14 DIAGNOSIS — K5190 Ulcerative colitis, unspecified, without complications: Secondary | ICD-10-CM | POA: Diagnosis not present

## 2023-02-14 DIAGNOSIS — R1310 Dysphagia, unspecified: Secondary | ICD-10-CM | POA: Diagnosis not present

## 2023-02-14 MED ORDER — ROSUVASTATIN CALCIUM 40 MG PO TABS
40.0000 mg | ORAL_TABLET | Freq: Every day | ORAL | 2 refills | Status: DC
  Filled 2023-02-14: qty 90, 90d supply, fill #0
  Filled 2023-05-29: qty 90, 90d supply, fill #1
  Filled 2023-09-05: qty 90, 90d supply, fill #2

## 2023-02-14 NOTE — Telephone Encounter (Signed)
   Pre-operative Risk Assessment    Patient Name: Daniel Buck  DOB: 09/11/1965 MRN: [id number]  DATE OF LAST VISIT: 01/08/23 Bernadene Person, NP DATE OF NEXT VISIT: 02/21/23 DR. HARDING    Request for Surgical Clearance    Procedure:   COLONOSCOPY/ENDOSCOPY  Date of Surgery:  Clearance 03/29/23                                 Surgeon:  DR. Moishe Spice Surgeon's Group or Practice Name:  [facility] Phone number:  [phone] Fax number:  [phone]   Type of Clearance Requested:   - Medical  - Pharmacy:  Hold Ticagrelor (Brilinta) x 5 DAYS PRIOR   Type of Anesthesia:   PROPOFOL   Additional requests/questions:    Elpidio Anis   02/14/2023, 3:56 PM

## 2023-02-15 NOTE — Telephone Encounter (Signed)
I will update all parties involved pt ha appt 02/21/23 with Dr. Herbie Baltimore. Will add need preop clearance to appt notes.

## 2023-02-15 NOTE — Telephone Encounter (Signed)
   Name: Daniel Buck  DOB: 1965/11/21  MRN: [id number]  Primary Cardiologist: Bryan Lemma, MD  Chart reviewed as part of pre-operative protocol coverage. The patient has an upcoming visit scheduled with Dr. Herbie Baltimore on 02/21/23 at which time clearance can be addressed in case there are any issues that would impact surgical recommendations.  Colonoscopy/endoscopy is not scheduled until 03/29/23 as below. I added preop FYI to appointment note so that provider is aware to address at time of outpatient visit.  Per office protocol the cardiology provider should forward their finalized clearance decision and recommendations regarding antiplatelet therapy to the requesting party below.    I will route this message as FYI to requesting party and remove this message from the preop box as separate preop APP input not needed at this time.   Please call with any questions.  Rip Harbour, NP  02/15/2023, 12:44 PM

## 2023-02-21 ENCOUNTER — Other Ambulatory Visit (HOSPITAL_COMMUNITY): Payer: Self-pay

## 2023-02-21 ENCOUNTER — Encounter: Payer: Self-pay | Admitting: Cardiology

## 2023-02-21 ENCOUNTER — Ambulatory Visit: Payer: Commercial Managed Care - PPO | Attending: Cardiology | Admitting: Cardiology

## 2023-02-21 VITALS — BP 126/78 | HR 79 | Ht 71.0 in | Wt 212.0 lb

## 2023-02-21 DIAGNOSIS — I25709 Atherosclerosis of coronary artery bypass graft(s), unspecified, with unspecified angina pectoris: Secondary | ICD-10-CM | POA: Diagnosis not present

## 2023-02-21 DIAGNOSIS — R5383 Other fatigue: Secondary | ICD-10-CM | POA: Diagnosis not present

## 2023-02-21 DIAGNOSIS — I2510 Atherosclerotic heart disease of native coronary artery without angina pectoris: Secondary | ICD-10-CM

## 2023-02-21 DIAGNOSIS — I10 Essential (primary) hypertension: Secondary | ICD-10-CM

## 2023-02-21 DIAGNOSIS — Z01818 Encounter for other preprocedural examination: Secondary | ICD-10-CM

## 2023-02-21 DIAGNOSIS — R002 Palpitations: Secondary | ICD-10-CM | POA: Diagnosis not present

## 2023-02-21 DIAGNOSIS — E785 Hyperlipidemia, unspecified: Secondary | ICD-10-CM | POA: Diagnosis not present

## 2023-02-21 DIAGNOSIS — I25110 Atherosclerotic heart disease of native coronary artery with unstable angina pectoris: Secondary | ICD-10-CM

## 2023-02-21 DIAGNOSIS — I25119 Atherosclerotic heart disease of native coronary artery with unspecified angina pectoris: Secondary | ICD-10-CM | POA: Diagnosis not present

## 2023-02-21 DIAGNOSIS — R42 Dizziness and giddiness: Secondary | ICD-10-CM

## 2023-02-21 DIAGNOSIS — N5201 Erectile dysfunction due to arterial insufficiency: Secondary | ICD-10-CM

## 2023-02-21 DIAGNOSIS — K219 Gastro-esophageal reflux disease without esophagitis: Secondary | ICD-10-CM

## 2023-02-21 DIAGNOSIS — Z9861 Coronary angioplasty status: Secondary | ICD-10-CM

## 2023-02-21 DIAGNOSIS — I252 Old myocardial infarction: Secondary | ICD-10-CM

## 2023-02-21 MED ORDER — CLOPIDOGREL BISULFATE 75 MG PO TABS
75.0000 mg | ORAL_TABLET | Freq: Every day | ORAL | 3 refills | Status: DC
  Filled 2023-02-21 – 2023-02-26 (×2): qty 90, 90d supply, fill #0
  Filled 2023-05-29: qty 90, 90d supply, fill #1
  Filled 2023-06-05: qty 90, 90d supply, fill #0
  Filled 2023-06-05 – 2023-08-28 (×2): qty 90, 90d supply, fill #1
  Filled 2023-12-04: qty 90, 90d supply, fill #2

## 2023-02-21 NOTE — Progress Notes (Unsigned)
Cardiology Office Note:  .   Date:  02/22/2023  ID:  Daniel Buck, DOB 11-22-1965, MRN [id number] PCP: Lupita Raider, MD  [facility] Providers Cardiologist:  Bryan Lemma, MD     Chief Complaint  Patient presents with   Hospitalization Follow-up    Post cath follow-up-1 month since last visit   Coronary Artery Disease    Actually seems to be feeling better.  Never started Imdur.    Patient Profile: .     Daniel Buck is a  57 y.o. male well-known to me with a CV history reviewed below who presents here for 1 month/post cath follow-up at the request of Lupita Raider, MD.  PMH: CAD 1996 (Dr. Alanda Amass with chronic stable angina s/p tandem BMS-proximal to mid RCA in 1996, s/p PCI-RCA in 1996 (ISR), s/p PTCA-mid RCA in 2002 (ISR),  s/p CABG x 4 (LIMA-LAD, SVG-diagonal, SVG-OM, SVG-PL) in 2021,  CTO of SVG-Diag & SVG-RCA; CTO of native RCA, mid LCx with 60% dist LM, 65% ost D1 & 80% mLAD. Patent LIMA-mLAD & SVG-OM2 - faint L-R collaterals OM2-RPL & septals to rPDA (08/2021; 12/2022) Mild ICM => EF ~ 50% with inferior hypokinesis. Hypertension Hyperlipidemia  Low Testosterone    Daniel Buck was last seen by Bernadene Person, NP on 01/08/2023  Referred for cardiac catheterization with complaints of 2 to 3 weeks increasing exertional dyspnea at rest and exertion with chest tightness at rest exertion. => Toprol increased to 100 a.m. daily  Subjective  Discussed the use of AI scribe software for clinical note transcription with the patient, who gave verbal consent to proceed.  History of Present Illness   The patient, with a history of coronary artery disease and previous coronary artery bypass grafting, presents with intermittent dyspnea and dizziness. The dyspnea is not consistently associated with activity and can occur at rest. The patient also reports episodes of dizziness, particularly upon standing, which have been a lifelong issue but have increased in frequency  over the past month.  The patient also experiences nocturnal dyspnea, occasionally waking up short of breath. He also reports frequent urination at night. The patient sleeps relatively flat, with only one or two pillows and blocks of wood under the bed for elevation. He also mentions a possible hiatal hernia and frequent episodes of reflux at night.  Chest discomfort, described as a pressure sensation, has been good for the past three days but was problematic for about five to six days prior. The discomfort is not exacerbated by physical activity and seems to be worse during periods of inactivity. The patient also reports occasional heart palpitations but nothing more than usual.  The patient has been on Brilinta since June 2021, following his surgery. He also takes losartan for blood pressure control and Ranexa, which was started post-surgery. The patient reports feeling cold frequently, which he attributes to the Brilinta. He also expresses concerns about memory issues, which have been worsening over time.  The patient also reports an unusual sensation of hand swelling, particularly after visiting the grocery store. The hands do not visibly appear swollen but feel as if they are. The patient also reports shortness of breath when bending over or squatting down, which he attributes to pressure on the diaphragm.  The patient has a history of arthritis, with a bad knee, shoulder, and hip. He also reports a sensation of his heart racing when the dose of metoprolol was previously reduced. The patient has been on a high dose of  metoprolol since then.     Cardiovascular ROS:  Per HPI  ROS:  Review of Systems - Negative except still has a little bit of fatigue, but better.  Arthritis pain still bothering him, notably in his hands with swelling and stiffness.  Still has mild orthostatic dizziness and bruising, and also feels cold in general..    Objective   Studies Reviewed: [name]   EKG  Interpretation Date/Time:  Wednesday February 21 2023 13:30:16 EST Ventricular Rate:  79 PR Interval:  184 QRS Duration:  124 QT Interval:  392 QTC Calculation: 449 R Axis:   4  Text Interpretation: Normal sinus rhythm Inferior infarct (cited on or before 07-Jul-2020) When compared with ECG of 08-Jan-2023 08:56, No significant change was found Confirmed by Bryan Lemma ([id number]) on 02/21/2023 1:36:07 PM    ECHO 01/26/2023: EF 50-55%.  No RWMA.  Normal RV.  Normal valves.  Normal RAP. CATH 01/10/2023: Overall stable findings with patent LIMA-LAD and SVG-OM and known occluded SVG-RPL and SVG-Diag(RI) Stable severe native CAD with 50 to 60% distal left main stenosis, extensive proximal LAD disease along with ostial 1st Diag ~ 50%, competitive flow with LIMA to LAD, and subtotally occluded LCx after OM1.  Septal collaterals fill small PDA and collaterals from diagonal and grafted OM fills the RPL. Mild reduced EF cannot fully assess but appears to be globally reduced roughly 40% with inferior hypokinesis. EDP mildly elevated at 19 mmHg.   Lab Results  Component Value Date   NA 138 01/08/2023   K 4.7 01/08/2023   CREATININE 1.34 (H) 01/08/2023   EGFR 62 01/08/2023   GLUCOSE 97 01/08/2023   Lab Results  Component Value Date   CHOL 83 09/19/2021   HDL 27 (L) 09/19/2021   LDLCALC 29 09/19/2021   TRIG 136 09/19/2021   CHOLHDL 3.1 09/19/2021  08/23/2022: TC 113, TG 124, HDL 32, LDL 59; hemoglobin A1c 6.0.   Risk Assessment/Calculations:             Physical Exam:   VS:  BP 126/78 (BP Location: Right Arm, Patient Position: Sitting, Cuff Size: Large)   Pulse 79   Ht 5\' 11"  (1.803 m)   Wt 212 lb (96.2 kg)   SpO2 97%   BMI 29.57 kg/m    Wt Readings from Last 3 Encounters:  02/21/23 212 lb (96.2 kg)  01/10/23 200 lb (90.7 kg)  01/08/23 208 lb (94.3 kg)    GEN: Well nourished, well developed in no acute distress; borderline obese but BMI, but is very fit and muscular.  Not obese.   Healthy-appearing.  In good spirits today. NECK: No JVD; No carotid bruits CARDIAC: Normal S1, S2; RRR, no murmurs, rubs, gallops RESPIRATORY:  Clear to auscultation without rales, wheezing or rhonchi ; nonlabored, good air movement. ABDOMEN: Soft, non-tender, non-distended EXTREMITIES:  No edema; No deformity      ASSESSMENT AND PLAN: .    Problem List Items Addressed This Visit       Cardiology Problems   CAD S/P percutaneous coronary angioplasty (Chronic)    Currently on aspirin and Brilinta treatment dose.  He is far enough out from his most recent cath that we twisted maintenance Thienopyridine and stop aspirin.  Plan: Stop aspirin, and convert to Plavix upon completion of current bottle of Brilinta.  Okay to hold Thienopyridine either Plavix or Brilinta 5 to 7 days preop for surgeries or procedures.      Relevant Medications   tadalafil (CIALIS) 5 MG tablet  Coronary artery disease involving coronary bypass graft of native heart with angina pectoris (HCC) (Chronic)    2 out of 4 patent grafts LIMA LIMA to LAD and SVG to OM 2 both of which have essentially minimal competitive flow.  I suspect the diagonal branch is getting enough negative flow that competitive flow to the SVG which causes shutdown, and there probably just was not enough flow in the RCA distribution for graft maintain patency. Thankfully, has stabilized out.      Relevant Medications   tadalafil (CIALIS) 5 MG tablet   Coronary artery disease involving native coronary artery of native heart with angina pectoris (HCC) (Chronic)    Intermittent dyspnea not associated with activity. Chest discomfort improved over the last three days. No new symptoms of angina. Echocardiogram showed preserved ejection fraction. Three out of four bypass grafts remain patent. -Continue Ranexa 500mg  twice daily and Toprol 100 mg daily-never started Imdur.   -Stop Losartan to potentially improve dizziness upon standing. -Stop Aspirin  now. -Continue Brilinta 90mg  until the end of the current bottle, then switch to Plavix. -Currently on 40 mg rosuvastatin and 10 mg Zetia, but with memory issues, the plan is for statin holiday x 1 month and then reassess symptoms and lipids.  May need to consider switching to 20 mg rosuvastatin and adding Nexletol if symptoms have improved. -Continue Jardiance 10 mg daily      Relevant Medications   tadalafil (CIALIS) 5 MG tablet   Essential hypertension, benign (Chronic)    Stable blood pressures on current dose of Toprol and losartan, but okay to hold losartan now.      Relevant Medications   tadalafil (CIALIS) 5 MG tablet   Hyperlipidemia with target low density lipoprotein (LDL) cholesterol less than 55 mg/dL (Chronic)    Labs were checked in May, HDL and LDL as well as total cholesterol were all well-controlled on current dose of rosuvastatin/Crestor and Zetia I am concerned about his reports of possible memory issues and concentration issues. -Stop Crestor for a month to assess for improvement in memory. If memory improves, reduce Crestor to half a tablet and add Nexletol. If memory issues persist, consider discontinuing statins altogether.      Relevant Medications   tadalafil (CIALIS) 5 MG tablet   Other Relevant Orders   Hepatic function panel   Lipid panel   Myocardial infarct, old (Chronic)   Relevant Medications   tadalafil (CIALIS) 5 MG tablet     Other   Erectile dysfunction (Chronic)    Okay to refill Cialis.  Instructed on importance of not using nitroglycerin within 72 hours.      Fatigue (Chronic)    Reports of nocturnal dyspnea and daytime fatigue. No significant snoring reported. -Monitor symptoms. If he worsens, consider a sleep study.      GERD (gastroesophageal reflux disease) (Chronic)    Reports of nocturnal reflux. This could be contributing to nocturnal dyspnea. -Monitor symptoms.      Heart palpitations (Chronic)    Much better with increased  dose of beta-blocker.  As such we can stop losartan.      Orthostatic dizziness (Chronic)    With EF being relatively normal and blood pressure being stable.  I would like to keep the current dose of Toprol because of palpitations.  Therefore we can stop losartan.      Preoperative clearance - Primary    Patient has upcoming GI procedures colonoscopy with possible endoscopy.  Stable cardiac standpoint with no real active  symptoms and recent evaluation.  No need for further testing.  We are stopping aspirin and converting to Plavix when current bottle of Brilinta is complete. Okay to hold either Plavix or Brilinta 5 to 7 days preop for colonoscopy.      Relevant Orders   EKG 12-Lead (Completed)    Medication Instructions:    Complete taking Brilinta once completed start taking Clopidogrel 75 mg - one tablet daily   Stop taking Aspirin  and Losartan    Take a statin holiday -- ( stop taking Cholesterol medication for one month)  --if  memory get better  restart  taking 1/2 ro tablet of  Rosuvastatin . - contact office  will start another medication along with Rosuvastatin  If no changes with memory  restart Rosuvastatin at current dose  -> Check lipids and LFTs in 6 months     Follow-Up: Return in about 6 months (around 08/21/2023) for 6 month follow-up with me.  Total time spent: 26 min spent with patient + 18 min spent charting = 44 min     Signed, Marykay Lex, MD, MS Bryan Lemma, M.D., M.S. Interventional Cardiologist  [facility]  Pager # [phone] Phone # [phone] [address] [place], [place] [address]

## 2023-02-21 NOTE — Patient Instructions (Addendum)
Medication Instructions:    Complete taking Brilinta once completed start taking Clopidogrel 75 mg - one tablet daily   Stop taking Aspirin  and Losartan    Take a statin holiday -- ( stop taking Cholesterol medication for one month)  --if  memory get better  restart  taking 1/2 ro tablet of  Rosuvastatin . - contact office  will start another medication along with Rosuvastatin  If no changes with memory  restart Rosuvastatin at current dose   *If you need a refill on your cardiac medications before your next appointment, please call your pharmacy*   Lab Work: in 6 months  fasting Lipid Liver function test  If you have labs (blood work) drawn today and your tests are completely normal, you will receive your results only by: MyChart Message (if you have MyChart) OR A paper copy in the mail If you have any lab test that is abnormal or we need to change your treatment, we will call you to review the results.   Testing/Procedures:  Not needed  Follow-Up: At [facility] you and your health needs are our priority.  As part of our continuing mission to provide you with exceptional heart care, we have created designated Provider Care Teams.  These Care Teams include your primary Cardiologist (physician) and Advanced Practice Providers (APPs -  Physician Assistants and Nurse Practitioners) who all work together to provide you with the care you need, when you need it.     Your next appointment:   6 month(s)  The format for your next appointment:   In Person  Provider:   Bryan Lemma, MD

## 2023-02-22 ENCOUNTER — Encounter: Payer: Self-pay | Admitting: Cardiology

## 2023-02-22 NOTE — Assessment & Plan Note (Signed)
Currently on aspirin and Brilinta treatment dose.  He is far enough out from his most recent cath that we twisted maintenance Thienopyridine and stop aspirin.  Plan: Stop aspirin, and convert to Plavix upon completion of current bottle of Brilinta.  Okay to hold Thienopyridine either Plavix or Brilinta 5 to 7 days preop for surgeries or procedures.

## 2023-02-22 NOTE — Assessment & Plan Note (Signed)
With EF being relatively normal and blood pressure being stable.  I would like to keep the current dose of Toprol because of palpitations.  Therefore we can stop losartan.

## 2023-02-22 NOTE — Assessment & Plan Note (Signed)
Much better with increased dose of beta-blocker.  As such we can stop losartan.

## 2023-02-22 NOTE — Assessment & Plan Note (Signed)
Labs were checked in May, HDL and LDL as well as total cholesterol were all well-controlled on current dose of rosuvastatin/Crestor and Zetia I am concerned about his reports of possible memory issues and concentration issues. -Stop Crestor for a month to assess for improvement in memory. If memory improves, reduce Crestor to half a tablet and add Nexletol. If memory issues persist, consider discontinuing statins altogether.

## 2023-02-22 NOTE — Assessment & Plan Note (Signed)
Reports of nocturnal reflux. This could be contributing to nocturnal dyspnea. -Monitor symptoms.

## 2023-02-22 NOTE — Assessment & Plan Note (Signed)
Patient has upcoming GI procedures colonoscopy with possible endoscopy.  Stable cardiac standpoint with no real active symptoms and recent evaluation.  No need for further testing.  We are stopping aspirin and converting to Plavix when current bottle of Brilinta is complete. Okay to hold either Plavix or Brilinta 5 to 7 days preop for colonoscopy.

## 2023-02-22 NOTE — Assessment & Plan Note (Signed)
Stable blood pressures on current dose of Toprol and losartan, but okay to hold losartan now.

## 2023-02-22 NOTE — Telephone Encounter (Signed)
Note and clearance forwarded.  Okay to hold Thienopyridine 5 days preop for surgeries or procedures.  Bryan Lemma, MD

## 2023-02-22 NOTE — Assessment & Plan Note (Signed)
Reports of nocturnal dyspnea and daytime fatigue. No significant snoring reported. -Monitor symptoms. If he worsens, consider a sleep study.

## 2023-02-22 NOTE — Assessment & Plan Note (Addendum)
Intermittent dyspnea not associated with activity. Chest discomfort improved over the last three days. No new symptoms of angina. Echocardiogram showed preserved ejection fraction. Three out of four bypass grafts remain patent. -Continue Ranexa 500mg  twice daily and Toprol 100 mg daily-never started Imdur.   -Stop Losartan to potentially improve dizziness upon standing. -Stop Aspirin now. -Continue Brilinta 90mg  until the end of the current bottle, then switch to Plavix. -Currently on 40 mg rosuvastatin and 10 mg Zetia, but with memory issues, the plan is for statin holiday x 1 month and then reassess symptoms and lipids.  May need to consider switching to 20 mg rosuvastatin and adding Nexletol if symptoms have improved. -Continue Jardiance 10 mg daily

## 2023-02-22 NOTE — Assessment & Plan Note (Signed)
Okay to refill Cialis.  Instructed on importance of not using nitroglycerin within 72 hours.

## 2023-02-22 NOTE — Assessment & Plan Note (Signed)
2 out of 4 patent grafts LIMA LIMA to LAD and SVG to OM 2 both of which have essentially minimal competitive flow.  I suspect the diagonal branch is getting enough negative flow that competitive flow to the SVG which causes shutdown, and there probably just was not enough flow in the RCA distribution for graft maintain patency. Thankfully, has stabilized out.

## 2023-02-26 ENCOUNTER — Other Ambulatory Visit (HOSPITAL_COMMUNITY): Payer: Self-pay

## 2023-02-26 NOTE — Telephone Encounter (Signed)
Will forward to preop to see notes from Dr. Herbie Baltimore and if any further notes are needed before faxing to surgeon office.

## 2023-03-12 ENCOUNTER — Other Ambulatory Visit: Payer: Self-pay

## 2023-04-25 ENCOUNTER — Telehealth: Payer: Self-pay | Admitting: Cardiology

## 2023-04-25 NOTE — Telephone Encounter (Signed)
I s/w Barb at [facility] and did review the medication hold. Pt was on Brilinta and looks to have been changed to Plavix per Dr. Herbie Baltimore. Lesle Reek was confirming if on Plavix is it ok to hold Plavix.   I read her notes from Dr. Herbie Baltimore note:  Okay to hold Thienopyridine either Plavix or Brilinta 5 to 7 days preop for surgeries or procedures.    Lesle Reek thanked me for the help and the call back.

## 2023-04-25 NOTE — Telephone Encounter (Signed)
They have questions about holding his medications

## 2023-05-03 ENCOUNTER — Other Ambulatory Visit (HOSPITAL_COMMUNITY): Payer: Self-pay

## 2023-05-06 ENCOUNTER — Other Ambulatory Visit (HOSPITAL_COMMUNITY): Payer: Self-pay

## 2023-05-07 ENCOUNTER — Other Ambulatory Visit (HOSPITAL_COMMUNITY): Payer: Self-pay

## 2023-05-07 MED ORDER — FLUTICASONE PROPIONATE 50 MCG/ACT NA SUSP
1.0000 | Freq: Every day | NASAL | 1 refills | Status: AC
  Filled 2023-05-07: qty 16, 60d supply, fill #0

## 2023-05-17 ENCOUNTER — Other Ambulatory Visit (HOSPITAL_COMMUNITY): Payer: Self-pay

## 2023-05-17 MED ORDER — ANASTROZOLE 1 MG PO TABS
0.5000 mg | ORAL_TABLET | ORAL | 3 refills | Status: AC
  Filled 2023-05-17: qty 4, 28d supply, fill #0
  Filled 2023-07-08: qty 4, 28d supply, fill #1
  Filled 2023-10-21: qty 4, 28d supply, fill #2
  Filled 2023-12-24: qty 4, 28d supply, fill #3

## 2023-06-01 ENCOUNTER — Other Ambulatory Visit (HOSPITAL_COMMUNITY): Payer: Self-pay

## 2023-06-04 DIAGNOSIS — K2289 Other specified disease of esophagus: Secondary | ICD-10-CM | POA: Diagnosis not present

## 2023-06-04 DIAGNOSIS — K449 Diaphragmatic hernia without obstruction or gangrene: Secondary | ICD-10-CM | POA: Diagnosis not present

## 2023-06-04 DIAGNOSIS — K5730 Diverticulosis of large intestine without perforation or abscess without bleeding: Secondary | ICD-10-CM | POA: Diagnosis not present

## 2023-06-04 DIAGNOSIS — K5140 Inflammatory polyps of colon without complications: Secondary | ICD-10-CM | POA: Diagnosis not present

## 2023-06-04 DIAGNOSIS — K219 Gastro-esophageal reflux disease without esophagitis: Secondary | ICD-10-CM | POA: Diagnosis not present

## 2023-06-04 DIAGNOSIS — K317 Polyp of stomach and duodenum: Secondary | ICD-10-CM | POA: Diagnosis not present

## 2023-06-04 DIAGNOSIS — K5289 Other specified noninfective gastroenteritis and colitis: Secondary | ICD-10-CM | POA: Diagnosis not present

## 2023-06-04 DIAGNOSIS — K5150 Left sided colitis without complications: Secondary | ICD-10-CM | POA: Diagnosis not present

## 2023-06-05 ENCOUNTER — Other Ambulatory Visit (HOSPITAL_COMMUNITY): Payer: Self-pay

## 2023-06-06 ENCOUNTER — Other Ambulatory Visit (HOSPITAL_COMMUNITY): Payer: Self-pay

## 2023-06-18 ENCOUNTER — Other Ambulatory Visit (HOSPITAL_COMMUNITY): Payer: Self-pay

## 2023-07-05 ENCOUNTER — Other Ambulatory Visit (HOSPITAL_COMMUNITY): Payer: Self-pay

## 2023-07-06 DIAGNOSIS — M25572 Pain in left ankle and joints of left foot: Secondary | ICD-10-CM | POA: Diagnosis not present

## 2023-07-25 ENCOUNTER — Other Ambulatory Visit: Payer: Self-pay | Admitting: Cardiology

## 2023-07-27 ENCOUNTER — Other Ambulatory Visit (HOSPITAL_COMMUNITY): Payer: Self-pay

## 2023-07-27 MED ORDER — RANOLAZINE ER 500 MG PO TB12
500.0000 mg | ORAL_TABLET | Freq: Two times a day (BID) | ORAL | 1 refills | Status: DC
Start: 2023-07-27 — End: 2024-01-27
  Filled 2023-07-27: qty 180, 90d supply, fill #0
  Filled 2023-10-21: qty 180, 90d supply, fill #1

## 2023-08-14 ENCOUNTER — Encounter: Payer: Self-pay | Admitting: Cardiology

## 2023-08-15 NOTE — Telephone Encounter (Signed)
 Okay to stop

## 2023-08-17 ENCOUNTER — Other Ambulatory Visit (HOSPITAL_COMMUNITY): Payer: Self-pay

## 2023-08-28 ENCOUNTER — Other Ambulatory Visit: Payer: Self-pay

## 2023-08-28 ENCOUNTER — Other Ambulatory Visit (HOSPITAL_COMMUNITY): Payer: Self-pay

## 2023-08-28 MED ORDER — ALLOPURINOL 300 MG PO TABS
300.0000 mg | ORAL_TABLET | Freq: Every day | ORAL | 0 refills | Status: DC
  Filled 2023-08-28: qty 90, 90d supply, fill #0

## 2023-08-29 DIAGNOSIS — R7301 Impaired fasting glucose: Secondary | ICD-10-CM | POA: Diagnosis not present

## 2023-08-29 DIAGNOSIS — K219 Gastro-esophageal reflux disease without esophagitis: Secondary | ICD-10-CM | POA: Diagnosis not present

## 2023-08-29 DIAGNOSIS — N183 Chronic kidney disease, stage 3 unspecified: Secondary | ICD-10-CM | POA: Diagnosis not present

## 2023-08-29 DIAGNOSIS — E611 Iron deficiency: Secondary | ICD-10-CM | POA: Diagnosis not present

## 2023-08-29 DIAGNOSIS — E7800 Pure hypercholesterolemia, unspecified: Secondary | ICD-10-CM | POA: Diagnosis not present

## 2023-08-29 DIAGNOSIS — Z0000 Encounter for general adult medical examination without abnormal findings: Secondary | ICD-10-CM | POA: Diagnosis not present

## 2023-08-29 DIAGNOSIS — F411 Generalized anxiety disorder: Secondary | ICD-10-CM | POA: Diagnosis not present

## 2023-08-29 DIAGNOSIS — I2510 Atherosclerotic heart disease of native coronary artery without angina pectoris: Secondary | ICD-10-CM | POA: Diagnosis not present

## 2023-08-29 DIAGNOSIS — I130 Hypertensive heart and chronic kidney disease with heart failure and stage 1 through stage 4 chronic kidney disease, or unspecified chronic kidney disease: Secondary | ICD-10-CM | POA: Diagnosis not present

## 2023-08-29 DIAGNOSIS — K5190 Ulcerative colitis, unspecified, without complications: Secondary | ICD-10-CM | POA: Diagnosis not present

## 2023-08-29 LAB — LAB REPORT - SCANNED
A1c: 6.1
Albumin, Urine POC: 0.73
Creatinine, POC: 88 mg/dL
EGFR: 57
Microalb Creat Ratio: 8.3

## 2023-09-05 ENCOUNTER — Other Ambulatory Visit (HOSPITAL_COMMUNITY): Payer: Self-pay

## 2023-09-05 MED ORDER — VILAZODONE HCL 40 MG PO TABS
40.0000 mg | ORAL_TABLET | Freq: Every day | ORAL | 3 refills | Status: AC
  Filled 2023-09-05: qty 90, 90d supply, fill #0
  Filled 2023-12-04: qty 90, 90d supply, fill #1
  Filled 2024-03-02: qty 90, 90d supply, fill #2

## 2023-09-12 ENCOUNTER — Other Ambulatory Visit (HOSPITAL_COMMUNITY): Payer: Self-pay

## 2023-09-12 DIAGNOSIS — N179 Acute kidney failure, unspecified: Secondary | ICD-10-CM | POA: Diagnosis not present

## 2023-09-12 DIAGNOSIS — N183 Chronic kidney disease, stage 3 unspecified: Secondary | ICD-10-CM | POA: Diagnosis not present

## 2023-09-12 DIAGNOSIS — J019 Acute sinusitis, unspecified: Secondary | ICD-10-CM | POA: Diagnosis not present

## 2023-09-12 MED ORDER — AZITHROMYCIN 250 MG PO TABS
ORAL_TABLET | ORAL | 0 refills | Status: AC
  Filled 2023-09-12: qty 6, 5d supply, fill #0

## 2023-09-21 DIAGNOSIS — N183 Chronic kidney disease, stage 3 unspecified: Secondary | ICD-10-CM | POA: Diagnosis not present

## 2023-09-25 DIAGNOSIS — N183 Chronic kidney disease, stage 3 unspecified: Secondary | ICD-10-CM | POA: Diagnosis not present

## 2023-10-03 ENCOUNTER — Encounter: Payer: Self-pay | Admitting: Family Medicine

## 2023-10-03 ENCOUNTER — Other Ambulatory Visit: Payer: Self-pay | Admitting: Family Medicine

## 2023-10-03 DIAGNOSIS — R0602 Shortness of breath: Secondary | ICD-10-CM

## 2023-10-03 DIAGNOSIS — R1013 Epigastric pain: Secondary | ICD-10-CM

## 2023-10-05 ENCOUNTER — Other Ambulatory Visit

## 2023-10-09 ENCOUNTER — Ambulatory Visit
Admission: RE | Admit: 2023-10-09 | Discharge: 2023-10-09 | Disposition: A | Discharge status: Home / Self Care | Source: Ambulatory Visit | Attending: Family Medicine | Admitting: Family Medicine

## 2023-10-09 DIAGNOSIS — R1013 Epigastric pain: Secondary | ICD-10-CM

## 2023-10-09 DIAGNOSIS — R0602 Shortness of breath: Secondary | ICD-10-CM | POA: Diagnosis not present

## 2023-10-09 DIAGNOSIS — I700 Atherosclerosis of aorta: Secondary | ICD-10-CM | POA: Diagnosis not present

## 2023-10-09 MED ORDER — IOPAMIDOL (ISOVUE-300) INJECTION 61%
100.0000 mL | Freq: Once | INTRAVENOUS | Status: AC | PRN
  Administered 2023-10-09: 100 mL via INTRAVENOUS

## 2023-11-22 ENCOUNTER — Other Ambulatory Visit (HOSPITAL_COMMUNITY): Payer: Self-pay

## 2023-11-22 MED ORDER — ALLOPURINOL 300 MG PO TABS
300.0000 mg | ORAL_TABLET | Freq: Every day | ORAL | 3 refills | Status: AC
  Filled 2023-11-22: qty 90, 90d supply, fill #0
  Filled 2024-02-20: qty 90, 90d supply, fill #1

## 2023-11-22 MED ORDER — ESOMEPRAZOLE MAGNESIUM 40 MG PO CPDR
40.0000 mg | DELAYED_RELEASE_CAPSULE | Freq: Two times a day (BID) | ORAL | 3 refills | Status: AC
  Filled 2023-11-22: qty 180, 90d supply, fill #0
  Filled 2024-02-20: qty 180, 90d supply, fill #1

## 2023-12-04 ENCOUNTER — Other Ambulatory Visit: Payer: Self-pay

## 2023-12-04 ENCOUNTER — Other Ambulatory Visit: Payer: Self-pay | Admitting: Cardiology

## 2023-12-04 ENCOUNTER — Other Ambulatory Visit (HOSPITAL_COMMUNITY): Payer: Self-pay

## 2023-12-04 MED ORDER — ROSUVASTATIN CALCIUM 40 MG PO TABS
40.0000 mg | ORAL_TABLET | Freq: Every day | ORAL | 0 refills | Status: DC
  Filled 2023-12-04: qty 90, 90d supply, fill #0

## 2023-12-05 ENCOUNTER — Other Ambulatory Visit (HOSPITAL_COMMUNITY): Payer: Self-pay

## 2023-12-05 ENCOUNTER — Other Ambulatory Visit: Payer: Self-pay

## 2023-12-05 MED ORDER — METOPROLOL SUCCINATE ER 100 MG PO TB24
100.0000 mg | ORAL_TABLET | Freq: Every day | ORAL | 0 refills | Status: DC
  Filled 2023-12-05: qty 90, 90d supply, fill #0

## 2023-12-06 ENCOUNTER — Other Ambulatory Visit (HOSPITAL_COMMUNITY): Payer: Self-pay

## 2023-12-06 DIAGNOSIS — N5312 Painful ejaculation: Secondary | ICD-10-CM | POA: Diagnosis not present

## 2023-12-06 DIAGNOSIS — M109 Gout, unspecified: Secondary | ICD-10-CM | POA: Diagnosis not present

## 2023-12-06 DIAGNOSIS — E7800 Pure hypercholesterolemia, unspecified: Secondary | ICD-10-CM | POA: Diagnosis not present

## 2023-12-06 DIAGNOSIS — B079 Viral wart, unspecified: Secondary | ICD-10-CM | POA: Diagnosis not present

## 2023-12-06 DIAGNOSIS — N50811 Right testicular pain: Secondary | ICD-10-CM | POA: Diagnosis not present

## 2023-12-06 DIAGNOSIS — R1031 Right lower quadrant pain: Secondary | ICD-10-CM | POA: Diagnosis not present

## 2023-12-06 DIAGNOSIS — K5190 Ulcerative colitis, unspecified, without complications: Secondary | ICD-10-CM | POA: Diagnosis not present

## 2023-12-06 DIAGNOSIS — I2510 Atherosclerotic heart disease of native coronary artery without angina pectoris: Secondary | ICD-10-CM | POA: Diagnosis not present

## 2023-12-06 DIAGNOSIS — I130 Hypertensive heart and chronic kidney disease with heart failure and stage 1 through stage 4 chronic kidney disease, or unspecified chronic kidney disease: Secondary | ICD-10-CM | POA: Diagnosis not present

## 2023-12-06 DIAGNOSIS — K219 Gastro-esophageal reflux disease without esophagitis: Secondary | ICD-10-CM | POA: Diagnosis not present

## 2023-12-06 MED ORDER — SULFAMETHOXAZOLE-TRIMETHOPRIM 800-160 MG PO TABS
1.0000 | ORAL_TABLET | Freq: Two times a day (BID) | ORAL | 0 refills | Status: AC
  Filled 2023-12-06: qty 56, 28d supply, fill #0

## 2023-12-06 MED ORDER — IMIQUIMOD 5 % EX CREA
1.0000 | TOPICAL_CREAM | CUTANEOUS | 1 refills | Status: AC
  Filled 2023-12-06: qty 12, 28d supply, fill #0
  Filled 2024-01-09: qty 12, 28d supply, fill #1

## 2024-01-02 DIAGNOSIS — M549 Dorsalgia, unspecified: Secondary | ICD-10-CM | POA: Diagnosis not present

## 2024-01-02 DIAGNOSIS — N419 Inflammatory disease of prostate, unspecified: Secondary | ICD-10-CM | POA: Diagnosis not present

## 2024-01-08 ENCOUNTER — Other Ambulatory Visit: Payer: Self-pay | Admitting: Family Medicine

## 2024-01-08 DIAGNOSIS — R1031 Right lower quadrant pain: Secondary | ICD-10-CM

## 2024-01-10 ENCOUNTER — Ambulatory Visit
Admission: RE | Admit: 2024-01-10 | Discharge: 2024-01-10 | Disposition: A | Discharge status: Home / Self Care | Source: Ambulatory Visit | Attending: Family Medicine | Admitting: Family Medicine

## 2024-01-10 DIAGNOSIS — R1031 Right lower quadrant pain: Secondary | ICD-10-CM

## 2024-01-10 DIAGNOSIS — K5641 Fecal impaction: Secondary | ICD-10-CM | POA: Diagnosis not present

## 2024-01-10 MED ORDER — IOPAMIDOL (ISOVUE-300) INJECTION 61%
100.0000 mL | Freq: Once | INTRAVENOUS | Status: AC | PRN
  Administered 2024-01-10: 100 mL via INTRAVENOUS

## 2024-01-11 ENCOUNTER — Other Ambulatory Visit (HOSPITAL_COMMUNITY): Payer: Self-pay

## 2024-01-11 DIAGNOSIS — M5459 Other low back pain: Secondary | ICD-10-CM | POA: Diagnosis not present

## 2024-01-15 ENCOUNTER — Encounter: Payer: Self-pay | Admitting: Cardiology

## 2024-01-19 DIAGNOSIS — M5459 Other low back pain: Secondary | ICD-10-CM | POA: Diagnosis not present

## 2024-01-24 NOTE — Telephone Encounter (Signed)
 I apologize, I was out of town I do not have very good access to the computer record.  I could not review the report.  Basically it says he has aortic atherosclerosis which is not unexpected based on the fact that he has heart artery disease. There is no mention of aneurysmal dilation.  There is also no suggestion of any narrowing/stenosis.  Basically the treatment for this is the same treatment is for the heart artery disease.  It is cholesterol plaque in the aortic just like cholesterol plaque in the arteries of the heart.  If he is having symptoms of leg pain or buttock pain with walking because of it with rest, we can check ABIs to assess for any narrowing but it did not seem that there is any noted on the CT scan.SABRA Jhonny Clay, MD

## 2024-01-27 ENCOUNTER — Other Ambulatory Visit: Payer: Self-pay | Admitting: Cardiology

## 2024-01-29 ENCOUNTER — Other Ambulatory Visit (HOSPITAL_COMMUNITY): Payer: Self-pay

## 2024-01-29 MED ORDER — RANOLAZINE ER 500 MG PO TB12
500.0000 mg | ORAL_TABLET | Freq: Two times a day (BID) | ORAL | 0 refills | Status: DC
  Filled 2024-01-29: qty 60, 30d supply, fill #0

## 2024-01-31 DIAGNOSIS — M5459 Other low back pain: Secondary | ICD-10-CM | POA: Diagnosis not present

## 2024-02-07 ENCOUNTER — Other Ambulatory Visit (HOSPITAL_COMMUNITY): Payer: Self-pay

## 2024-02-07 MED ORDER — IMIQUIMOD 5 % EX CREA
1.0000 | TOPICAL_CREAM | CUTANEOUS | 1 refills | Status: AC
  Filled 2024-02-07: qty 12, 28d supply, fill #0
  Filled 2024-04-11: qty 12, 28d supply, fill #1

## 2024-02-14 DIAGNOSIS — M19042 Primary osteoarthritis, left hand: Secondary | ICD-10-CM | POA: Diagnosis not present

## 2024-02-14 DIAGNOSIS — M19049 Primary osteoarthritis, unspecified hand: Secondary | ICD-10-CM | POA: Diagnosis not present

## 2024-02-14 DIAGNOSIS — M79645 Pain in left finger(s): Secondary | ICD-10-CM | POA: Diagnosis not present

## 2024-02-14 DIAGNOSIS — M25542 Pain in joints of left hand: Secondary | ICD-10-CM | POA: Diagnosis not present

## 2024-02-24 ENCOUNTER — Other Ambulatory Visit: Payer: Self-pay | Admitting: Cardiology

## 2024-02-24 DIAGNOSIS — I252 Old myocardial infarction: Secondary | ICD-10-CM

## 2024-02-25 ENCOUNTER — Other Ambulatory Visit (HOSPITAL_COMMUNITY): Payer: Self-pay

## 2024-02-25 ENCOUNTER — Other Ambulatory Visit: Payer: Self-pay

## 2024-02-25 MED ORDER — CLOPIDOGREL BISULFATE 75 MG PO TABS
75.0000 mg | ORAL_TABLET | Freq: Every day | ORAL | 0 refills | Status: DC
  Filled 2024-02-25: qty 90, 90d supply, fill #0

## 2024-02-25 MED ORDER — RANOLAZINE ER 500 MG PO TB12
500.0000 mg | ORAL_TABLET | Freq: Two times a day (BID) | ORAL | 0 refills | Status: AC
  Filled 2024-02-25 – 2024-02-28 (×2): qty 60, 30d supply, fill #0

## 2024-02-28 ENCOUNTER — Other Ambulatory Visit (HOSPITAL_COMMUNITY): Payer: Self-pay

## 2024-03-03 ENCOUNTER — Telehealth: Payer: Self-pay | Admitting: Cardiology

## 2024-03-03 NOTE — Telephone Encounter (Signed)
 Spoke to patient 's wife  appt schedule for 04/09/24 at 1:20 pm.  RN offered an earlier appt ,  Wife decline   [name] mother is in hospice at present time.   Verbalized understanding  of appt time.

## 2024-03-03 NOTE — Telephone Encounter (Signed)
 Pt's spouse would like to schedule an appt with Dr Anner and was offered an appt with the Np or PA but would like to schedule with Dr please advise

## 2024-03-03 NOTE — Telephone Encounter (Signed)
 [name] wife Eleanor is calling to schedule appt for patient with Dr. Anner. She states they will wait to see Dr. Anner, do not want to see APP.  Informed Melissa there are no open appts with Dr. Anner available coming up that I am able to schedule. Will forward to Dr. Genice nurse to see if patient can be added in to schedule somewhere.  Patient is overdue for F/U (was to be seen again May 2025).

## 2024-03-11 ENCOUNTER — Other Ambulatory Visit: Payer: Self-pay | Admitting: Cardiology

## 2024-03-11 ENCOUNTER — Other Ambulatory Visit (HOSPITAL_COMMUNITY): Payer: Self-pay

## 2024-03-11 ENCOUNTER — Other Ambulatory Visit: Payer: Self-pay

## 2024-03-11 MED ORDER — EZETIMIBE 10 MG PO TABS
10.0000 mg | ORAL_TABLET | Freq: Every day | ORAL | 0 refills | Status: DC
  Filled 2024-03-11: qty 90, 90d supply, fill #0

## 2024-03-12 ENCOUNTER — Other Ambulatory Visit (HOSPITAL_COMMUNITY): Payer: Self-pay

## 2024-03-12 ENCOUNTER — Other Ambulatory Visit: Payer: Self-pay

## 2024-03-12 MED ORDER — ROSUVASTATIN CALCIUM 40 MG PO TABS
40.0000 mg | ORAL_TABLET | Freq: Every day | ORAL | 0 refills | Status: DC
  Filled 2024-03-12: qty 30, 30d supply, fill #0

## 2024-03-12 MED ORDER — RANOLAZINE ER 500 MG PO TB12
500.0000 mg | ORAL_TABLET | Freq: Two times a day (BID) | ORAL | 0 refills | Status: DC
  Filled 2024-03-12 – 2024-03-25 (×3): qty 60, 30d supply, fill #0

## 2024-03-18 ENCOUNTER — Other Ambulatory Visit (HOSPITAL_COMMUNITY): Payer: Self-pay

## 2024-03-23 ENCOUNTER — Other Ambulatory Visit: Payer: Self-pay | Admitting: Cardiology

## 2024-03-23 DIAGNOSIS — I2510 Atherosclerotic heart disease of native coronary artery without angina pectoris: Secondary | ICD-10-CM

## 2024-03-24 ENCOUNTER — Other Ambulatory Visit (HOSPITAL_COMMUNITY): Payer: Self-pay

## 2024-03-24 MED ORDER — METOPROLOL SUCCINATE ER 100 MG PO TB24
100.0000 mg | ORAL_TABLET | Freq: Every day | ORAL | 0 refills | Status: DC
  Filled 2024-03-24: qty 30, 30d supply, fill #0

## 2024-03-25 ENCOUNTER — Other Ambulatory Visit (HOSPITAL_COMMUNITY): Payer: Self-pay

## 2024-03-28 ENCOUNTER — Other Ambulatory Visit (HOSPITAL_COMMUNITY): Payer: Self-pay

## 2024-03-28 ENCOUNTER — Other Ambulatory Visit: Payer: Self-pay | Admitting: Cardiology

## 2024-03-28 DIAGNOSIS — I2510 Atherosclerotic heart disease of native coronary artery without angina pectoris: Secondary | ICD-10-CM

## 2024-04-01 ENCOUNTER — Other Ambulatory Visit (HOSPITAL_COMMUNITY): Payer: Self-pay

## 2024-04-02 ENCOUNTER — Other Ambulatory Visit (HOSPITAL_COMMUNITY): Payer: Self-pay

## 2024-04-02 ENCOUNTER — Encounter (HOSPITAL_COMMUNITY): Payer: Self-pay

## 2024-04-09 ENCOUNTER — Ambulatory Visit: Attending: Cardiology | Admitting: Cardiology

## 2024-04-09 ENCOUNTER — Other Ambulatory Visit (HOSPITAL_COMMUNITY): Payer: Self-pay

## 2024-04-09 ENCOUNTER — Encounter: Payer: Self-pay | Admitting: Cardiology

## 2024-04-09 VITALS — BP 110/82 | HR 75 | Ht 71.0 in | Wt 218.5 lb

## 2024-04-09 DIAGNOSIS — I25119 Atherosclerotic heart disease of native coronary artery with unspecified angina pectoris: Secondary | ICD-10-CM

## 2024-04-09 DIAGNOSIS — I252 Old myocardial infarction: Secondary | ICD-10-CM | POA: Diagnosis not present

## 2024-04-09 DIAGNOSIS — I10 Essential (primary) hypertension: Secondary | ICD-10-CM

## 2024-04-09 DIAGNOSIS — R42 Dizziness and giddiness: Secondary | ICD-10-CM

## 2024-04-09 DIAGNOSIS — Z951 Presence of aortocoronary bypass graft: Secondary | ICD-10-CM

## 2024-04-09 DIAGNOSIS — I2111 ST elevation (STEMI) myocardial infarction involving right coronary artery: Secondary | ICD-10-CM | POA: Diagnosis not present

## 2024-04-09 DIAGNOSIS — N5201 Erectile dysfunction due to arterial insufficiency: Secondary | ICD-10-CM | POA: Diagnosis not present

## 2024-04-09 DIAGNOSIS — I2510 Atherosclerotic heart disease of native coronary artery without angina pectoris: Secondary | ICD-10-CM

## 2024-04-09 DIAGNOSIS — Z9861 Coronary angioplasty status: Secondary | ICD-10-CM

## 2024-04-09 DIAGNOSIS — R413 Other amnesia: Secondary | ICD-10-CM

## 2024-04-09 DIAGNOSIS — R002 Palpitations: Secondary | ICD-10-CM | POA: Diagnosis not present

## 2024-04-09 DIAGNOSIS — E785 Hyperlipidemia, unspecified: Secondary | ICD-10-CM | POA: Diagnosis not present

## 2024-04-09 DIAGNOSIS — R635 Abnormal weight gain: Secondary | ICD-10-CM

## 2024-04-09 DIAGNOSIS — I25709 Atherosclerosis of coronary artery bypass graft(s), unspecified, with unspecified angina pectoris: Secondary | ICD-10-CM

## 2024-04-09 MED ORDER — ROSUVASTATIN CALCIUM 40 MG PO TABS
40.0000 mg | ORAL_TABLET | Freq: Every day | ORAL | 2 refills | Status: AC
  Filled 2024-04-09 – 2024-04-11 (×3): qty 90, 90d supply, fill #0

## 2024-04-09 MED ORDER — RANOLAZINE ER 500 MG PO TB12
500.0000 mg | ORAL_TABLET | Freq: Two times a day (BID) | ORAL | 2 refills | Status: AC
  Filled 2024-04-09 – 2024-04-27 (×3): qty 180, 90d supply, fill #0

## 2024-04-09 MED ORDER — EZETIMIBE 10 MG PO TABS
10.0000 mg | ORAL_TABLET | Freq: Every day | ORAL | 2 refills | Status: AC
  Filled 2024-04-09: qty 90, 90d supply, fill #0

## 2024-04-09 MED ORDER — CLOPIDOGREL BISULFATE 75 MG PO TABS
75.0000 mg | ORAL_TABLET | Freq: Every day | ORAL | 2 refills | Status: AC
  Filled 2024-04-09: qty 90, 90d supply, fill #0

## 2024-04-09 MED ORDER — METOPROLOL SUCCINATE ER 100 MG PO TB24
100.0000 mg | ORAL_TABLET | Freq: Every day | ORAL | 2 refills | Status: AC
  Filled 2024-04-09 – 2024-04-17 (×3): qty 90, 90d supply, fill #0

## 2024-04-09 NOTE — Progress Notes (Signed)
 Patient Cardiology Office Note:  .   Date:  04/13/2024  ID:  Daniel Buck, DOB [birth year], MRN [id number] PCP: Loreli Kins, MD  [facility] Providers Cardiologist:  Alm Clay, MD     Chief Complaint  Patient presents with   Follow-up    Delayed annual follow-up.  Doing well.   Coronary Artery Disease    Angina controlled. No change in memory issues with statin holiday.  Back on statin    Patient Profile: .     Daniel Buck is a 60 y.o. male with a CV PMH noted below who presents here for ~ annual f/u at the request of Loreli Kins, MD.  PMH: CAD 1996 (Dr. Rufus with chronic stable angina s/p tandem BMS-proximal to mid RCA in 1996, s/p PCI-RCA in 1996 (ISR), s/p PTCA-mid RCA in 2002 (ISR),  s/p CABG x 4 (LIMA-LAD, SVG-diagonal, SVG-OM, SVG-PL) in 2021,  CTO of SVG-Diag & SVG-RCA; CTO of native RCA, mid LCx with 60% dist LM, 65% ost D1 & 80% mLAD. Patent LIMA-mLAD & SVG-OM2 - faint L-R collaterals OM2-RPL & septals to rPDA (08/2021; 12/2022) Mild ICM => EF ~ 50% with inferior hypokinesis. Hypertension Hyperlipidemia  Low Testosterone   I last saw Mr. [name] in November 2024 1 month follow-up post cath.  He noted dyspnea which could recur both at rest and with exertion.  He also noted some dizziness upon standing which is a long standing issue but worsening over the previous month.  Occasionally noted waking up short of breath but more frequently nocturia.  No orthopnea, but assumes the head of bed elevated due to GERD.  Had been noticing chest pain or pressure that was on exertion but had stabilized valve.  Also noted occasional heart palpitations.  He is also concerned about memory issues.  He noted improved palpitations with increased dose of beta-blocker.  Overall, Plan: > Complete taking Brilinta  once completed start taking Clopidogrel  75 mg - one tablet daily   Stop taking Aspirin   and Losartan      Take a statin holiday -- ( stop taking Cholesterol  medication for one month)  --if  memory get better  restart  taking 1/2 ro tablet of  Rosuvastatin  . - contact office  will start another medication along with Rosuvastatin      Subjective  Discussed the use of AI scribe software for clinical note transcription with the patient, who gave verbal consent to proceed.  History of Present Illness Daniel Buck is a 59 year old male with coronary artery disease who presents for follow-up regarding his cardiac health.  He experiences occasional heart palpitations described as 'skippy', lasting about an hour and occurring once or twice a week. These episodes do not cause dizziness or other discomfort. No chest pain is currently reported.  He has shortness of breath with exertion, which has remained unchanged. No orthopnea or paroxysmal nocturnal dyspnea.  His current medications include Crestor  40 mg, Ranexa  500 mg twice a day, Toprol  100 mg daily, Zetia  10 mg daily, Plavix  75 mg daily, and Nexium . He has stopped taking Jardiance  due to a previous issue. He also uses Aldara  for a skin condition and takes iron supplements. Arimidex  is taken every other week for testosterone -related issues.  He has a history of a heart attack at age 58 and has undergone cardiac catheterization, which revealed stable blockages. He was previously placed on Ranexa  to improve collateral circulation.  He reports a weight gain of 18 pounds since his last visit  in October 2024, attributed to decreased physical activity due to back pain and poor dietary habits, including increased sugar intake.  He expresses concerns about memory issues, noting a family history of dementia, as his mother had dementia before passing away. He has experienced episodes of forgetfulness, such as forgetting his destination while driving.     Objective   Medications: Clopidogrel  75 mg daily, Zetia  10 mL daily, Toprol -XL 100 mg daily, Ranexa  500 mg twice daily, rosuvastatin  41 daily - As needed Cialis   5 mg-(not to take nitroglycerin  within 72 hours) - Multiple medications are listed  Studies Reviewed: SABRA   EKG Interpretation Date/Time:  Wednesday April 09 2024 13:24:06 EST Ventricular Rate:  75 PR Interval:  178 QRS Duration:  124 QT Interval:  404 QTC Calculation: 451 R Axis:   -68  Text Interpretation: Normal sinus rhythm Indeterminate axis Right bundle branch block Inferior infarct (cited on or before 07-Jul-2020) When compared with ECG of 21-Feb-2023 13:30, Right bundle branch block is now Present Confirmed by Anner Lenis ([id number]) on 04/09/2024 1:30:23 PM    Labs scanned from 08/29/2023: A1c 6.1 BUN 22, creatinine 1.43.  K+ 4.4.  Otherwise normal chemistry panel and LFTs.  Iron panel showed serum iron 36 and TIBC of 528 with transferrin 377.  TC 143, TG 189, HDL 28, LDL 82 => LDL is up from the 59 but it was last year until cholesterol had been 113.  Will need to readdress lipid management during follow-up visit.  (08/30/2023) Results Labs Lipid panel (08/2023): LDL 82 mg/dL, increased from prior, possibly related to temporary discontinuation of Crestor .  Diagnostic ECHO 01/26/2023: EF 50-55%.  Inferior hypokinesis.  Normal RV.  Normal valves.  Normal RAP. CATH 01/10/2023: Overall stable findings with patent LIMA-LAD and SVG-OM and known occluded SVG-RPL and SVG-Diag(RI) Stable severe native CAD with 50 to 60% distal left main stenosis, extensive proximal LAD disease along with ostial 1st Diag ~ 50%, competitive flow with LIMA to LAD, and subtotally occluded LCx after OM1.  Septal collaterals fill small PDA and collaterals from diagonal and grafted OM fills the RPL. Mild reduced EF cannot fully assess but appears to be globally reduced roughly 40% with inferior hypokinesis. EDP mildly elevated at 19 mmHg.     Risk Assessment/Calculations:         Physical Exam:   VS:  BP 110/82 (BP Location: Right Arm, Patient Position: Sitting, Cuff Size: Large)   Pulse 75   Ht 5' 11 (1.803 m)    Wt 218 lb 8 oz (99.1 kg)   SpO2 95%   BMI 30.47 kg/m    Wt Readings from Last 3 Encounters:  04/09/24 218 lb 8 oz (99.1 kg)  02/21/23 212 lb (96.2 kg)  01/10/23 200 lb (90.7 kg)     GEN: Well nourished, robust, well groomed;  in no acute distress; BMI by weight would indicate obesity, but he is not obese NECK: No JVD; No carotid bruits CARDIAC: Normal S1, S2; RRR, no murmurs, rubs, gallops RESPIRATORY:  Clear to auscultation without rales, wheezing or rhonchi ; nonlabored, good air movement. ABDOMEN: Soft, non-tender, non-distended EXTREMITIES:  No edema; No deformity     ASSESSMENT AND PLAN: .   Coronary artery disease involving native coronary artery of native heart with angina pectoris Stable, occlusive CAD with known occluded RCA and SVG to RCA with collateral flow to the RCA via septal perforators and grafted OM branch.  SVG to diagonal likely due to competitive flow.  Patent SVG to  OM 2 after occluded LCx, and patent LIMA to LAD. No current chest discomfort or dyspnea.-Symptoms notably improved after issuing Ranexa /ranolazine . - Continue ranolazine  500 mg twice daily, and Toprol -XL 100 mg daily for antianginal management. - Continue clopidogrel  75 mg daily for prophylaxis Okay to hold Plavix  5 to 7 days preop for surgeries or procedures.. - Continue rosuvastatin  49 daily, and ezetimibe  10 mg daily. - Refill PRN NTG. Trying to avoid Imdur  to allow for the use of Cialis .  Coronary artery disease involving coronary bypass graft of native heart with angina pectoris 2/4 grafts remain patent with occluded SVG-RPL and SVG-D1.  Patent LIMA to LAD and SVG-OM 2. Suspected the SVG-diagonal close due to diminutive flow, and that the RPL was too small for graft.  Last cath with no PCI options.  Continue medical management.  Essential hypertension, benign Blood pressure well-controlled on Toprol -XL 100 mg only. Previously on losartan , but discontinued due to dizziness.  Inferior and  posterior STEMI (ST elevation myocardial infarction) (HCC) History of inferior STEMI x 2-first was occluded RCA and next with occluded graft to the RCA. Other ACS presentations. Now quite familiar with angina, but has not had any notable symptoms since his last cath with the medicine adjustment. Echo in the past showed evidence of inferior akinesis suggesting prior inferior infarct.  Otherwise preserved EF.   Orthostatic dizziness With prior orthostatic dizziness, we stopped his losartan .  He is now only on Toprol -XL 100 mg  Erectile dysfunction We discussed the use of Cialis .  Sildenafil  likely due to short acting and Cialis  may be more beneficial, but needs to be very careful to not use Nexlizet within 72 hours.  Hyperlipidemia with target low density lipoprotein (LDL) cholesterol less than 55 mg/dL Previous LDL elevation due to Crestor  discontinuation. Current LDL target is less than 55 mg/dL. Previous LDL was 82 mg/dL off Crestor . - Ordered fasting lipid panel. - Ordered liver function tests. - Consider adding Nexletol and likely ezetimibe  if LDL remains elevated.  Heart palpitations Intermittent palpitations once or twice a week, no associated dizziness or discomfort. - Well-controlled on current dose of Toprol -XL 100 mg daily  S/P CABG x 4 Last relation was just cardiac catheterization 15 months ago. Would hold off on ischemic evaluation unless symptoms warrant until 2028/2029  Memory loss No change in symptoms with holding statin.  Therefore continue statin. Suggest potentially referral to neurology.  Weight gain Weight gain of 18 pounds due to decreased activity and increased sugar intake. Discussed exercise and dietary modifications. - Encouraged resumption of physical activity. - Advised dietary modifications to reduce sugar intake.   Orders Placed This Encounter  Procedures   EKG 12-Lead           Follow-Up: Return in about 10 months (around 02/07/2025) for  Routine follow up with me, [employer].     Signed, Alm MICAEL Clay, MD, MS Alm Clay, M.D., M.S. Interventional Cardiologist  [facility] Pager # [phone]

## 2024-04-09 NOTE — Patient Instructions (Signed)
 Medication Instructions:  Your physician recommends that you continue on your current medications as directed. Please refer to the Current Medication list given to you today.  *If you need a refill on your cardiac medications before your next appointment, please call your pharmacy*  Lab Work: None ordered  If you have labs (blood work) drawn today and your tests are completely normal, you will receive your results only by: MyChart Message (if you have MyChart) OR A paper copy in the mail If you have any lab test that is abnormal or we need to change your treatment, we will call you to review the results.  Testing/Procedures: None ordered  Follow-Up: At [facility], you and your health needs are our priority.  As part of our continuing mission to provide you with exceptional heart care, our providers are all part of one team.  This team includes your primary Cardiologist (physician) and Advanced Practice Providers or APPs (Physician Assistants and Nurse Practitioners) who all work together to provide you with the care you need, when you need it.  Your next appointment:   10 month(s)  Provider:   Alm Clay, MD    We recommend signing up for the patient portal called MyChart.  Sign up information is provided on this After Visit Summary.  MyChart is used to connect with patients for Virtual Visits (Telemedicine).  Patients are able to view lab/test results, encounter notes, upcoming appointments, etc.  Non-urgent messages can be sent to your provider as well.   To learn more about what you can do with MyChart, go to [email or web address].   Other Instructions

## 2024-04-10 ENCOUNTER — Other Ambulatory Visit (HOSPITAL_COMMUNITY): Payer: Self-pay

## 2024-04-11 ENCOUNTER — Other Ambulatory Visit (HOSPITAL_COMMUNITY): Payer: Self-pay

## 2024-04-11 ENCOUNTER — Other Ambulatory Visit: Payer: Self-pay

## 2024-04-13 ENCOUNTER — Encounter: Payer: Self-pay | Admitting: Cardiology

## 2024-04-13 DIAGNOSIS — R635 Abnormal weight gain: Secondary | ICD-10-CM | POA: Insufficient documentation

## 2024-04-13 NOTE — Assessment & Plan Note (Addendum)
 No change in symptoms with holding statin.  Therefore continue statin. Suggest potentially referral to neurology.

## 2024-04-13 NOTE — Assessment & Plan Note (Signed)
 Stable, occlusive CAD with known occluded RCA and SVG to RCA with collateral flow to the RCA via septal perforators and grafted OM branch.  SVG to diagonal likely due to competitive flow.  Patent SVG to OM 2 after occluded LCx, and patent LIMA to LAD. No current chest discomfort or dyspnea.-Symptoms notably improved after issuing Ranexa /ranolazine . - Continue ranolazine  500 mg twice daily, and Toprol -XL 100 mg daily for antianginal management. - Continue clopidogrel  75 mg daily for prophylaxis Okay to hold Plavix  5 to 7 days preop for surgeries or procedures.. - Continue rosuvastatin  49 daily, and ezetimibe  10 mg daily. - Refill PRN NTG. Trying to avoid Imdur  to allow for the use of Cialis .

## 2024-04-13 NOTE — Assessment & Plan Note (Signed)
 We discussed the use of Cialis .  Sildenafil  likely due to short acting and Cialis  may be more beneficial, but needs to be very careful to not use Nexlizet within 72 hours.

## 2024-04-13 NOTE — Assessment & Plan Note (Signed)
 With prior orthostatic dizziness, we stopped his losartan .  He is now only on Toprol -XL 100 mg

## 2024-04-13 NOTE — Assessment & Plan Note (Signed)
 Intermittent palpitations once or twice a week, no associated dizziness or discomfort. - Well-controlled on current dose of Toprol -XL 100 mg daily

## 2024-04-13 NOTE — Assessment & Plan Note (Signed)
 Blood pressure well-controlled on Toprol -XL 100 mg only. Previously on losartan , but discontinued due to dizziness.

## 2024-04-13 NOTE — Assessment & Plan Note (Signed)
 Weight gain of 18 pounds due to decreased activity and increased sugar intake. Discussed exercise and dietary modifications. - Encouraged resumption of physical activity. - Advised dietary modifications to reduce sugar intake.

## 2024-04-13 NOTE — Assessment & Plan Note (Signed)
 History of inferior STEMI x 2-first was occluded RCA and next with occluded graft to the RCA. Other ACS presentations. Now quite familiar with angina, but has not had any notable symptoms since his last cath with the medicine adjustment. Echo in the past showed evidence of inferior akinesis suggesting prior inferior infarct.  Otherwise preserved EF.

## 2024-04-13 NOTE — Assessment & Plan Note (Addendum)
 2/4 grafts remain patent with occluded SVG-RPL and SVG-D1.  Patent LIMA to LAD and SVG-OM 2. Suspected the SVG-diagonal close due to diminutive flow, and that the RPL was too small for graft.  Last cath with no PCI options.  Continue medical management.

## 2024-04-13 NOTE — Assessment & Plan Note (Signed)
 Last relation was just cardiac catheterization 15 months ago. Would hold off on ischemic evaluation unless symptoms warrant until 2028/2029

## 2024-04-13 NOTE — Assessment & Plan Note (Signed)
 Previous LDL elevation due to Crestor  discontinuation. Current LDL target is less than 55 mg/dL. Previous LDL was 82 mg/dL off Crestor . - Ordered fasting lipid panel. - Ordered liver function tests. - Consider adding Nexletol and likely ezetimibe  if LDL remains elevated.

## 2024-04-15 ENCOUNTER — Other Ambulatory Visit (HOSPITAL_COMMUNITY): Payer: Self-pay

## 2024-04-17 ENCOUNTER — Other Ambulatory Visit: Payer: Self-pay

## 2024-04-24 ENCOUNTER — Other Ambulatory Visit (HOSPITAL_COMMUNITY): Payer: Self-pay

## 2024-04-27 ENCOUNTER — Other Ambulatory Visit (HOSPITAL_COMMUNITY): Payer: Self-pay

## 2024-04-27 ENCOUNTER — Other Ambulatory Visit: Payer: Self-pay
